# Patient Record
Sex: Female | Born: 1985 | Race: Black or African American | Hispanic: No | Marital: Single | Smoking: Never smoker
Health system: Southern US, Community
[De-identification: ages and names within clinical notes are randomized; demographics above are authoritative.]

## PROBLEM LIST (undated history)

## (undated) DIAGNOSIS — D509 Iron deficiency anemia, unspecified: Secondary | ICD-10-CM

## (undated) DIAGNOSIS — D649 Anemia, unspecified: Secondary | ICD-10-CM

## (undated) DIAGNOSIS — K529 Noninfective gastroenteritis and colitis, unspecified: Secondary | ICD-10-CM

## (undated) DIAGNOSIS — K5190 Ulcerative colitis, unspecified, without complications: Secondary | ICD-10-CM

## (undated) DIAGNOSIS — O1490 Unspecified pre-eclampsia, unspecified trimester: Secondary | ICD-10-CM

## (undated) HISTORY — DX: Iron deficiency anemia, unspecified: D50.9

---
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
## 2007-11-16 ENCOUNTER — Emergency Department (HOSPITAL_COMMUNITY): Admission: EM | Admit: 2007-11-16 | Discharge: 2007-11-16 | Payer: Self-pay | Admitting: Family Medicine

## 2010-09-04 ENCOUNTER — Inpatient Hospital Stay (INDEPENDENT_AMBULATORY_CARE_PROVIDER_SITE_OTHER)
Admission: RE | Admit: 2010-09-04 | Discharge: 2010-09-04 | Disposition: A | Discharge status: Home / Self Care | Payer: Self-pay | Source: Ambulatory Visit | Attending: Emergency Medicine | Admitting: Emergency Medicine

## 2010-09-04 DIAGNOSIS — K047 Periapical abscess without sinus: Secondary | ICD-10-CM

## 2010-09-04 DIAGNOSIS — S21109A Unspecified open wound of unspecified front wall of thorax without penetration into thoracic cavity, initial encounter: Secondary | ICD-10-CM

## 2010-09-05 ENCOUNTER — Emergency Department (HOSPITAL_COMMUNITY)
Admission: EM | Admit: 2010-09-05 | Discharge: 2010-09-05 | Disposition: A | Discharge status: Home / Self Care | Payer: Self-pay | Attending: Emergency Medicine | Admitting: Emergency Medicine

## 2010-09-05 DIAGNOSIS — R221 Localized swelling, mass and lump, neck: Secondary | ICD-10-CM | POA: Insufficient documentation

## 2010-09-05 DIAGNOSIS — R220 Localized swelling, mass and lump, head: Secondary | ICD-10-CM | POA: Insufficient documentation

## 2010-09-05 DIAGNOSIS — K047 Periapical abscess without sinus: Secondary | ICD-10-CM | POA: Insufficient documentation

## 2011-06-09 ENCOUNTER — Emergency Department (INDEPENDENT_AMBULATORY_CARE_PROVIDER_SITE_OTHER): Payer: Self-pay

## 2011-06-09 ENCOUNTER — Emergency Department (INDEPENDENT_AMBULATORY_CARE_PROVIDER_SITE_OTHER)
Admission: EM | Admit: 2011-06-09 | Discharge: 2011-06-09 | Disposition: A | Discharge status: Home Health | Payer: Self-pay | Source: Home / Self Care | Attending: Emergency Medicine | Admitting: Emergency Medicine

## 2011-06-09 ENCOUNTER — Encounter (HOSPITAL_COMMUNITY): Payer: Self-pay

## 2011-06-09 DIAGNOSIS — D649 Anemia, unspecified: Secondary | ICD-10-CM

## 2011-06-09 DIAGNOSIS — K529 Noninfective gastroenteritis and colitis, unspecified: Secondary | ICD-10-CM

## 2011-06-09 DIAGNOSIS — K5289 Other specified noninfective gastroenteritis and colitis: Secondary | ICD-10-CM

## 2011-06-09 LAB — DIFFERENTIAL
Basophils Absolute: 0.1 10*3/uL (ref 0.0–0.1)
Basophils Relative: 1 % (ref 0–1)
Eosinophils Absolute: 2.4 10*3/uL — ABNORMAL HIGH (ref 0.0–0.7)
Eosinophils Relative: 29 % — ABNORMAL HIGH (ref 0–5)
Lymphocytes Relative: 26 % (ref 12–46)
Lymphs Abs: 2.2 10*3/uL (ref 0.7–4.0)
Monocytes Absolute: 1.2 10*3/uL — ABNORMAL HIGH (ref 0.1–1.0)
Monocytes Relative: 14 % — ABNORMAL HIGH (ref 3–12)
Neutro Abs: 2.4 10*3/uL (ref 1.7–7.7)
Neutrophils Relative %: 30 % — ABNORMAL LOW (ref 43–77)

## 2011-06-09 LAB — CBC
HCT: 30.4 % — ABNORMAL LOW (ref 36.0–46.0)
Hemoglobin: 8.7 g/dL — ABNORMAL LOW (ref 12.0–15.0)
MCH: 20.9 pg — ABNORMAL LOW (ref 26.0–34.0)
MCHC: 28.6 g/dL — ABNORMAL LOW (ref 30.0–36.0)
MCV: 73.1 fL — ABNORMAL LOW (ref 78.0–100.0)
Platelets: 652 10*3/uL — ABNORMAL HIGH (ref 150–400)
RBC: 4.16 MIL/uL (ref 3.87–5.11)
RDW: 16.1 % — ABNORMAL HIGH (ref 11.5–15.5)
WBC: 8.3 10*3/uL (ref 4.0–10.5)

## 2011-06-09 MED ORDER — DICYCLOMINE HCL 20 MG PO TABS: 20.0000 mg | ORAL_TABLET | Freq: Two times a day (BID) | ORAL | Status: DC

## 2011-06-09 MED ORDER — CIPROFLOXACIN HCL 500 MG PO TABS: 500.0000 mg | ORAL_TABLET | Freq: Two times a day (BID) | ORAL | Status: AC

## 2011-06-09 NOTE — ED Provider Notes (Signed)
Chief Complaint  Patient presents with  . Abdominal Pain    History of Present Illness:   Monica Green is a 26 year old female who has had a five-day history of recurring generalized abdominal pain. The pains are sharp and involve the entire abdomen. They occur every 30-60 minutes and can last about 30 seconds at a time. There is nothing particular that brings the pains on. They're not brought on by eating or by food. Throughout that same time, she's had loose stools occurring once or twice a day with occasional blood but no mucus. She's felt a little bit nauseated but has not had any vomiting. No fever, chills, or sweats. Her appetite has been good, but she's not been eating much out of fear this might make her worse. She's had no foreign travel, suspicious exposures, or animal exposure. She denies any urinary symptoms or GYN complaints. Her last menstrual period was 2 weeks ago. She has not been sexually active for over a year.  Review of Systems:  Other than noted above, the patient denies any of the following symptoms: Constitutional:  No fever, chills, fatigue, weight loss or anorexia. Lungs:  No cough or shortness of breath. Heart:  No chest pain, palpitations, syncope or edema. Abdomen:  No nausea, vomiting, hematememesis, melena, diarrhea, or hematochezia. GU:  No dysuria, frequency, urgency, or hematuria. Gyn:  No vaginal discharge, itching, abnormal bleeding or pelvic pain. Skin:  No rash or itching.  PMFSH:  Past medical history, family history, social history, meds, and allergies were reviewed.  Physical Exam:   Vital signs:  LMP 05/31/2011 Gen:  Alert, oriented, in no distress. Lungs:  Breath sounds clear and equal bilaterally.  No wheezes, rales or rhonchi. Heart:  Regular rhythm.  No gallops or murmers.   Abdomen:  There was mild periumbilical tenderness to palpation without guarding or rebound. No organomegaly or mass. Bowel sounds are normally active. I recommended a digital rectal  exam, but patient declined at this time. Skin:  Clear, warm and dry.  No rash.  Labs:   Results for orders placed during the hospital encounter of 06/09/11  POCT PREGNANCY, URINE      Component Value Range   Preg Test, Ur NEGATIVE  NEGATIVE   CBC      Component Value Range   WBC 8.3  4.0 - 10.5 (K/uL)   RBC 4.16  3.87 - 5.11 (MIL/uL)   Hemoglobin 8.7 (*) 12.0 - 15.0 (g/dL)   HCT 16.1 (*) 09.6 - 46.0 (%)   MCV 73.1 (*) 78.0 - 100.0 (fL)   MCH 20.9 (*) 26.0 - 34.0 (pg)   MCHC 28.6 (*) 30.0 - 36.0 (g/dL)   RDW 04.5 (*) 40.9 - 15.5 (%)   Platelets 652 (*) 150 - 400 (K/uL)  DIFFERENTIAL      Component Value Range   Neutrophils Relative 30 (*) 43 - 77 (%)   Lymphocytes Relative 26  12 - 46 (%)   Monocytes Relative 14 (*) 3 - 12 (%)   Eosinophils Relative 29 (*) 0 - 5 (%)   Basophils Relative 1  0 - 1 (%)   Neutro Abs 2.4  1.7 - 7.7 (K/uL)   Lymphs Abs 2.2  0.7 - 4.0 (K/uL)   Monocytes Absolute 1.2 (*) 0.1 - 1.0 (K/uL)   Eosinophils Absolute 2.4 (*) 0.0 - 0.7 (K/uL)   Basophils Absolute 0.1  0.0 - 0.1 (K/uL)    A urinalysis was normal but has not resulted in her computerized record right  now.  Radiology:  Dg Abd Acute W/chest  06/09/2011  *RADIOLOGY REPORT*  Clinical Data: The stomach pain  ACUTE ABDOMEN SERIES (ABDOMEN 2 VIEW & CHEST 1 VIEW)  Comparison: None.  Findings: The lungs are clear without focal consolidation, edema, effusion or pneumothorax.  Cardiopericardial silhouette is within normal limits for size.  Imaged bony structures of the thorax are intact.  Upright film shows no evidence for intraperitoneal free air. There is no evidence for gaseous bowel dilation to suggest obstruction. Visualized bony structures are unremarkable.  Decorative beads overlie the pelvis.  IMPRESSION: Normal chest x-ray.  No evidence for bowel obstruction or perforation.  Original Report Authenticated By: ERIC A. MANSELL, M.D.    Assessment:   Diagnoses that have been ruled out:  None  Diagnoses  that are still under consideration:  None  Final diagnoses:  Gastroenteritis - bacterial versus viral   Anemia - probably due to menstrual blood loss. The patient does admit to heavy menses.     Plan:   1.  The following meds were prescribed:   New Prescriptions   CIPROFLOXACIN (CIPRO) 500 MG TABLET    Take 1 tablet (500 mg total) by mouth every 12 (twelve) hours.   DICYCLOMINE (BENTYL) 20 MG TABLET    Take 1 tablet (20 mg total) by mouth 2 (two) times daily.   2.  The patient was instructed in symptomatic care and handouts were given. 3.  The patient was told to return if becoming worse in any way, if no better in 3 or 4 days, and given some red flag symptoms that would indicate earlier return. 4.  A stool culture will be obtained. 5.  The patient was told to take ferrous sulfate 325 mg 3 times daily with meals and to followup with primary care physician in one to 2 weeks.    Reuben Likes, MD 06/09/11 [phone]

## 2011-06-09 NOTE — ED Notes (Signed)
C/o generalized aching and burning all over abdomen that is intermittent in nature.  Sx started on Monday.  Denies fever, urinary sx, vaginal sx.  States she always has frequent stools but that she has noticed they are a little more frequent and more liquid than usual.  Denies n/v. States when sx started she was also having belching.

## 2011-06-09 NOTE — ED Notes (Signed)
Gave patient verbal instructions as to how to obtain fecal specimen. Gave patient specimen container, bio bag, instructions and to have specimen back to the [facility] lab by 1930

## 2011-06-09 NOTE — Discharge Instructions (Signed)
You have been diagnosed with gastroenteritis.  This can be caused by a virus or a bacteria.  Viral infections can last from less than a day to a week.  If your symptoms last more than a week, a bacterial infection is more likely.  Either way, you must assume you are contagious and take infectious precautions.  If you work in food preparation, you should stay out of work.  Likewise, you should not prepare food for your family.  Practice frequent hand washing.  Hand sanitizer does not reliably kill the virus.  Wash your hands after you use the bathroom, touch your mouth or face, and before contact with anyone.  Do not kiss anyone and do not let anyone eat or drink after you.  For right now, we recommend taking only clear liquids.  This would include things like Gator Aid or other sports drinks, tea, water, ice chips, clear juices, ginger ale, Seven-Up, Sprite, Pedialyte, jello, clear broth--anything you can see through and applesauce.  You should do this for at least 24 hours, perhaps longer.  We recommend small sips at a time.  Sometimes drinking a large amount will cause you to be nauseated and you will vomit it back up.  Sometimes it helps to have this chilled or drink it over ice chips.  Once your stomach settles down a little, you can advance to a very light diet.  We have a diet called the b.r.a.t. Diet which stands for the following:  Bananas  Rice  Apple sauce (not apple juice)  Toast or crackers.  If diarrhea becomes a problem, you may try Imodeum unless your doctor tells you not to. You can take up to 4 per day or 1 every 6 hours.  Stick with this for about 24 hours, then you may advance to a more regular diet, but your stomach will be sensitive for 5 to 7 days, so it would be a good idea to avoid heavy, greasy, fried, or spicey foods.    You should return if:  You symptoms are not better in 3 days or they have gone on for 7 days total.  You have severe symptoms of high fever or severe  abdominal pain.  You feel you are getting dehydrated with dizziness, weakness, muscle cramps, or severe fatigue.  You have blood in your vomitus or stool.  This includes black discoloration of your vomitus or stool.  But remember that Pepto Bismol can cause black stools.     Take Ferrous sulfate 325 mg 3 times daily with meals and recheck blood count with primary care doctor in 2 weeks.

## 2012-02-04 ENCOUNTER — Encounter (HOSPITAL_COMMUNITY): Payer: Self-pay | Admitting: *Deleted

## 2012-02-04 ENCOUNTER — Emergency Department (HOSPITAL_COMMUNITY)
Admission: EM | Admit: 2012-02-04 | Discharge: 2012-02-04 | Disposition: A | Discharge status: Home / Self Care | Payer: Self-pay | Attending: Emergency Medicine | Admitting: Emergency Medicine

## 2012-02-04 DIAGNOSIS — R109 Unspecified abdominal pain: Secondary | ICD-10-CM

## 2012-02-04 DIAGNOSIS — R112 Nausea with vomiting, unspecified: Secondary | ICD-10-CM | POA: Insufficient documentation

## 2012-02-04 DIAGNOSIS — N390 Urinary tract infection, site not specified: Secondary | ICD-10-CM | POA: Insufficient documentation

## 2012-02-04 LAB — URINALYSIS, ROUTINE W REFLEX MICROSCOPIC
Glucose, UA: NEGATIVE mg/dL
Nitrite: NEGATIVE
Protein, ur: NEGATIVE mg/dL
pH: 6.5 (ref 5.0–8.0)

## 2012-02-04 LAB — URINE MICROSCOPIC-ADD ON

## 2012-02-04 LAB — PREGNANCY, URINE: Preg Test, Ur: NEGATIVE

## 2012-02-04 MED ORDER — SODIUM CHLORIDE 0.9 % IV SOLN
Freq: Once | INTRAVENOUS | Status: AC
  Administered 2012-02-04: 50 mL/h via INTRAVENOUS

## 2012-02-04 MED ORDER — ONDANSETRON HCL 4 MG/2ML IJ SOLN
INTRAMUSCULAR | Status: AC
  Filled 2012-02-04: qty 2

## 2012-02-04 MED ORDER — HYDROCODONE-ACETAMINOPHEN 5-325 MG PO TABS: 1.0000 | ORAL_TABLET | ORAL | Status: DC | PRN

## 2012-02-04 MED ORDER — SULFAMETHOXAZOLE-TRIMETHOPRIM 800-160 MG PO TABS: 1.0000 | ORAL_TABLET | Freq: Two times a day (BID) | ORAL | Status: DC

## 2012-02-04 MED ORDER — FENTANYL CITRATE 0.05 MG/ML IJ SOLN
50.0000 ug | Freq: Once | INTRAMUSCULAR | Status: AC
  Administered 2012-02-04: 50 ug via INTRAVENOUS
  Filled 2012-02-04: qty 2

## 2012-02-04 MED ORDER — ONDANSETRON HCL 4 MG/2ML IJ SOLN
4.0000 mg | Freq: Once | INTRAMUSCULAR | Status: AC
  Administered 2012-02-04: 4 mg via INTRAVENOUS

## 2012-02-04 MED ORDER — FLUCONAZOLE 150 MG PO TABS: 150.0000 mg | ORAL_TABLET | Freq: Once | ORAL | Status: DC

## 2012-02-04 MED ORDER — ONDANSETRON HCL 4 MG PO TABS: 4.0000 mg | ORAL_TABLET | Freq: Four times a day (QID) | ORAL | Status: DC

## 2012-02-04 NOTE — ED Provider Notes (Signed)
Medical screening examination/treatment/procedure(s) were performed by non-physician practitioner and as supervising physician I was immediately available for consultation/collaboration.   Hanley Seamen, MD 02/04/12 [phone]

## 2012-02-04 NOTE — ED Notes (Signed)
Patient is alert and oriented x3.  She is complaining right side flank pain.   The pain started at 11pm last night.  She has been nausea and vomiting since last night Currently she rates her pain a 7 of 10.

## 2012-02-04 NOTE — ED Notes (Signed)
Called lab to request results.  Lab denies that blood was received.  Tech in triage states that blood was sent.  Pt advised that labs need to be redrawn.

## 2012-02-04 NOTE — ED Provider Notes (Signed)
History     CSN: [id number]  Arrival date & time 02/04/12  0102   First MD Initiated Contact with Patient 02/04/12 0615      Chief Complaint  Patient presents with  . Flank Pain    (Consider location/radiation/quality/duration/timing/severity/associated sxs/prior treatment) Patient is a 26 y.o. female presenting with flank pain. The history is provided by the patient and a relative.  Flank Pain This is a new problem. The current episode started today. Associated symptoms include abdominal pain, nausea and vomiting. Pertinent negatives include no fever. Associated symptoms comments: Sudden onset of right flank pain that woke her from sleep early this morning causing nausea and vomiting. No urinary symptoms or hematuria. No fever. No history of kidney stones. She denies vaginal discharge or pelvic pain. The pain from flank radiated around to the RLQ abdomen.Marland Kitchen    History reviewed. No pertinent past medical history.  History reviewed. No pertinent past surgical history.  History reviewed. No pertinent family history.  History  Substance Use Topics  . Smoking status: Never Smoker   . Smokeless tobacco: Not on file  . Alcohol Use: Yes    OB History    Grav Para Term Preterm Abortions TAB SAB Ect Mult Living                  Review of Systems  Constitutional: Negative for fever.  Gastrointestinal: Positive for nausea, vomiting and abdominal pain. Negative for diarrhea.  Genitourinary: Positive for flank pain. Negative for dysuria, hematuria, vaginal bleeding, vaginal discharge and pelvic pain.    Allergies  Review of patient's allergies indicates no known allergies.  Home Medications   Current Outpatient Rx  Name  Route  Sig  Dispense  Refill  . ACETAMINOPHEN 500 MG PO TABS   Oral   Take 500 mg by mouth every 6 (six) hours as needed. PAIN           BP 104/45  Resp 18  SpO2 99%  LMP 01/21/2012  Physical Exam  Constitutional: She is oriented to person, place,  and time. She appears well-developed and well-nourished.  Neck: Normal range of motion.  Pulmonary/Chest: Effort normal.  Abdominal: Soft. Bowel sounds are normal. There is no tenderness. There is no rebound and no guarding.  Genitourinary:       No flank tenderness.   Musculoskeletal: Normal range of motion.  Neurological: She is alert and oriented to person, place, and time.  Skin: Skin is warm and dry.    ED Course  Procedures (including critical care time)  Labs Reviewed  URINALYSIS, ROUTINE W REFLEX MICROSCOPIC - Abnormal; Notable for the following:    APPearance CLOUDY (*)     Hgb urine dipstick MODERATE (*)     Ketones, ur 15 (*)     Leukocytes, UA LARGE (*)     All other components within normal limits  URINE MICROSCOPIC-ADD ON - Abnormal; Notable for the following:    Squamous Epithelial / LPF FEW (*)     Bacteria, UA FEW (*)     All other components within normal limits  PREGNANCY, URINE  CBC WITH DIFFERENTIAL  COMPREHENSIVE METABOLIC PANEL  URINE CULTURE   No results found.   No diagnosis found. 1. uti 2. Flank pain   MDM  The patient was given pain medication in ED and has been pain free since that time. No further nausea. UA shows leukocytes and blood, rare bacteria. She has no RLQ abdominal tenderness. Discussed condition with mom and patient,  CT to eval for stone vs urine culture and treatment. Will treat with antibiotics and patient understands to return with any worsening symptoms incl. Fever or severe pain.        Rodena Medin, PA-C 02/04/12 [phone]

## 2012-02-04 NOTE — Discharge Instructions (Signed)
IT IS IMPORTANT TO RETURN TO THE ED FOR FURTHER EVALUATION IF YOU HAVE HIGH FEVER, SEVERE PAIN OR NEW CONCERN. TAKE ANTIBIOTICS AS PRESCRIBED. TAKE DIFLUCAN AFTER COMPLETION OF ANTIBIOTICS IF YOU DEVELOP SYMPTOMS OF YEAST INFECTION.   Urinary Tract Infection Urinary tract infections (UTIs) can develop anywhere along your urinary tract. Your urinary tract is your body's drainage system for removing wastes and extra water. Your urinary tract includes two kidneys, two ureters, a bladder, and a urethra. Your kidneys are a pair of bean-shaped organs. Each kidney is about the size of your fist. They are located below your ribs, one on each side of your spine. CAUSES Infections are caused by microbes, which are microscopic organisms, including fungi, viruses, and bacteria. These organisms are so small that they can only be seen through a microscope. Bacteria are the microbes that most commonly cause UTIs. SYMPTOMS  Symptoms of UTIs may vary by age and gender of the patient and by the location of the infection. Symptoms in young women typically include a frequent and intense urge to urinate and a painful, burning feeling in the bladder or urethra during urination. Older women and men are more likely to be tired, shaky, and weak and have muscle aches and abdominal pain. A fever may mean the infection is in your kidneys. Other symptoms of a kidney infection include pain in your back or sides below the ribs, nausea, and vomiting. DIAGNOSIS To diagnose a UTI, your caregiver will ask you about your symptoms. Your caregiver also will ask to provide a urine sample. The urine sample will be tested for bacteria and white blood cells. White blood cells are made by your body to help fight infection. TREATMENT  Typically, UTIs can be treated with medication. Because most UTIs are caused by a bacterial infection, they usually can be treated with the use of antibiotics. The choice of antibiotic and length of treatment depend  on your symptoms and the type of bacteria causing your infection. HOME CARE INSTRUCTIONS  If you were prescribed antibiotics, take them exactly as your caregiver instructs you. Finish the medication even if you feel better after you have only taken some of the medication.  Drink enough water and fluids to keep your urine clear or pale yellow.  Avoid caffeine, tea, and carbonated beverages. They tend to irritate your bladder.  Empty your bladder often. Avoid holding urine for long periods of time.  Empty your bladder before and after sexual intercourse.  After a bowel movement, women should cleanse from front to back. Use each tissue only once. SEEK MEDICAL CARE IF:   You have back pain.  You develop a fever.  Your symptoms do not begin to resolve within 3 days. SEEK IMMEDIATE MEDICAL CARE IF:   You have severe back pain or lower abdominal pain.  You develop chills.  You have nausea or vomiting.  You have continued burning or discomfort with urination. MAKE SURE YOU:   Understand these instructions.  Will watch your condition.  Will get help right away if you are not doing well or get worse. Document Released: 12/28/2004 Document Revised: 09/19/2011 Document Reviewed: 04/28/2011 [facility] Patient Information 2013 [place], [place].

## 2012-02-05 LAB — URINE CULTURE: Colony Count: 80000

## 2015-01-04 DIAGNOSIS — N390 Urinary tract infection, site not specified: Secondary | ICD-10-CM | POA: Diagnosis present

## 2015-01-04 DIAGNOSIS — K589 Irritable bowel syndrome without diarrhea: Secondary | ICD-10-CM | POA: Diagnosis present

## 2015-01-04 DIAGNOSIS — N760 Acute vaginitis: Secondary | ICD-10-CM | POA: Diagnosis present

## 2015-01-04 DIAGNOSIS — D509 Iron deficiency anemia, unspecified: Principal | ICD-10-CM | POA: Diagnosis present

## 2015-01-04 DIAGNOSIS — N132 Hydronephrosis with renal and ureteral calculous obstruction: Secondary | ICD-10-CM | POA: Diagnosis present

## 2015-01-05 ENCOUNTER — Inpatient Hospital Stay (HOSPITAL_COMMUNITY)
Admission: EM | Admit: 2015-01-05 | Discharge: 2015-01-06 | DRG: 812 | Disposition: A | Discharge status: Home / Self Care | Payer: Self-pay | Attending: Internal Medicine | Admitting: Internal Medicine

## 2015-01-05 ENCOUNTER — Encounter (HOSPITAL_COMMUNITY): Payer: Self-pay | Admitting: Emergency Medicine

## 2015-01-05 ENCOUNTER — Inpatient Hospital Stay (HOSPITAL_COMMUNITY): Payer: Self-pay

## 2015-01-05 DIAGNOSIS — D509 Iron deficiency anemia, unspecified: Secondary | ICD-10-CM | POA: Diagnosis present

## 2015-01-05 DIAGNOSIS — B9689 Other specified bacterial agents as the cause of diseases classified elsewhere: Secondary | ICD-10-CM | POA: Diagnosis present

## 2015-01-05 DIAGNOSIS — N760 Acute vaginitis: Secondary | ICD-10-CM

## 2015-01-05 DIAGNOSIS — R109 Unspecified abdominal pain: Secondary | ICD-10-CM

## 2015-01-05 DIAGNOSIS — D649 Anemia, unspecified: Secondary | ICD-10-CM

## 2015-01-05 DIAGNOSIS — N201 Calculus of ureter: Secondary | ICD-10-CM | POA: Diagnosis present

## 2015-01-05 LAB — URINE MICROSCOPIC-ADD ON

## 2015-01-05 LAB — URINALYSIS, ROUTINE W REFLEX MICROSCOPIC
BILIRUBIN URINE: NEGATIVE
Glucose, UA: NEGATIVE mg/dL
Hgb urine dipstick: NEGATIVE
Ketones, ur: 15 mg/dL — AB
NITRITE: NEGATIVE
PH: 8.5 — AB (ref 5.0–8.0)
Protein, ur: 30 mg/dL — AB
SPECIFIC GRAVITY, URINE: 1.023 (ref 1.005–1.030)
UROBILINOGEN UA: 0.2 mg/dL (ref 0.0–1.0)

## 2015-01-05 LAB — CBC WITH DIFFERENTIAL/PLATELET
BASOS ABS: 0.2 10*3/uL — AB (ref 0.0–0.1)
Basophils Relative: 2 %
Eosinophils Absolute: 1.5 10*3/uL — ABNORMAL HIGH (ref 0.0–0.7)
Eosinophils Relative: 17 %
HCT: 15.9 % — ABNORMAL LOW (ref 36.0–46.0)
Hemoglobin: 3.7 g/dL — CL (ref 12.0–15.0)
LYMPHS ABS: 1.5 10*3/uL (ref 0.7–4.0)
Lymphocytes Relative: 16 %
MCH: 13.7 pg — ABNORMAL LOW (ref 26.0–34.0)
MCHC: 23.3 g/dL — ABNORMAL LOW (ref 30.0–36.0)
MCV: 58.7 fL — ABNORMAL LOW (ref 78.0–100.0)
MONO ABS: 1.2 10*3/uL — AB (ref 0.1–1.0)
Monocytes Relative: 13 %
NEUTROS PCT: 52 %
Neutro Abs: 4.7 10*3/uL (ref 1.7–7.7)
PLATELETS: 797 10*3/uL — AB (ref 150–400)
RBC: 2.71 MIL/uL — AB (ref 3.87–5.11)
RDW: 21.5 % — AB (ref 11.5–15.5)
WBC: 9.1 10*3/uL (ref 4.0–10.5)

## 2015-01-05 LAB — IRON AND TIBC
Iron: 6 ug/dL — ABNORMAL LOW (ref 28–170)
Saturation Ratios: 1 % — ABNORMAL LOW (ref 10.4–31.8)
TIBC: 459 ug/dL — ABNORMAL HIGH (ref 250–450)
UIBC: 453 ug/dL

## 2015-01-05 LAB — CBC
HEMATOCRIT: 16.5 % — AB (ref 36.0–46.0)
HEMATOCRIT: 28.5 % — AB (ref 36.0–46.0)
HEMOGLOBIN: 3.8 g/dL — AB (ref 12.0–15.0)
Hemoglobin: 8.2 g/dL — ABNORMAL LOW (ref 12.0–15.0)
MCH: 13.5 pg — ABNORMAL LOW (ref 26.0–34.0)
MCH: 19.5 pg — ABNORMAL LOW (ref 26.0–34.0)
MCHC: 23 g/dL — ABNORMAL LOW (ref 30.0–36.0)
MCHC: 28.8 g/dL — AB (ref 30.0–36.0)
MCV: 58.5 fL — AB (ref 78.0–100.0)
MCV: 67.9 fL — AB (ref 78.0–100.0)
Platelets: 682 10*3/uL — ABNORMAL HIGH (ref 150–400)
Platelets: 833 10*3/uL — ABNORMAL HIGH (ref 150–400)
RBC: 2.82 MIL/uL — ABNORMAL LOW (ref 3.87–5.11)
RBC: 4.2 MIL/uL (ref 3.87–5.11)
RDW: 21.4 % — AB (ref 11.5–15.5)
RDW: 27 % — AB (ref 11.5–15.5)
WBC: 8 10*3/uL (ref 4.0–10.5)
WBC: 9 10*3/uL (ref 4.0–10.5)

## 2015-01-05 LAB — BASIC METABOLIC PANEL
ANION GAP: 8 (ref 5–15)
BUN: 9 mg/dL (ref 6–20)
CHLORIDE: 105 mmol/L (ref 101–111)
CO2: 24 mmol/L (ref 22–32)
Calcium: 8.6 mg/dL — ABNORMAL LOW (ref 8.9–10.3)
Creatinine, Ser: 0.77 mg/dL (ref 0.44–1.00)
GFR calc Af Amer: >60 mL/min (ref >60)
GLUCOSE: 113 mg/dL — AB (ref 65–99)
POTASSIUM: 3.4 mmol/L — AB (ref 3.5–5.1)
SODIUM: 137 mmol/L (ref 135–145)

## 2015-01-05 LAB — WET PREP, GENITAL
TRICH WET PREP: NONE SEEN
Yeast Wet Prep HPF POC: NONE SEEN

## 2015-01-05 LAB — RETICULOCYTES
RBC.: 2.87 MIL/uL — AB (ref 3.87–5.11)
RETIC COUNT ABSOLUTE: 66 10*3/uL (ref 19.0–186.0)
Retic Ct Pct: 2.3 % (ref 0.4–3.1)

## 2015-01-05 LAB — ABO/RH: ABO/RH(D): B POS

## 2015-01-05 LAB — I-STAT CHEM 8, ED
BUN: 10 mg/dL (ref 6–20)
CALCIUM ION: 1.15 mmol/L (ref 1.12–1.23)
CHLORIDE: 105 mmol/L (ref 101–111)
Creatinine, Ser: 0.7 mg/dL (ref 0.44–1.00)
GLUCOSE: 104 mg/dL — AB (ref 65–99)
HEMATOCRIT: 17 % — AB (ref 36.0–46.0)
HEMOGLOBIN: 5.8 g/dL — AB (ref 12.0–15.0)
POTASSIUM: 3.4 mmol/L — AB (ref 3.5–5.1)
SODIUM: 140 mmol/L (ref 135–145)
TCO2: 21 mmol/L (ref 0–100)

## 2015-01-05 LAB — POC OCCULT BLOOD, ED: Fecal Occult Bld: NEGATIVE

## 2015-01-05 LAB — FERRITIN: Ferritin: 4 ng/mL — ABNORMAL LOW (ref 11–307)

## 2015-01-05 LAB — GC/CHLAMYDIA PROBE AMP (~~LOC~~) NOT AT ARMC
Chlamydia: NEGATIVE
Neisseria Gonorrhea: NEGATIVE

## 2015-01-05 LAB — POC URINE PREG, ED: PREG TEST UR: NEGATIVE

## 2015-01-05 LAB — FOLATE: FOLATE: 6.7 ng/mL (ref >5.9)

## 2015-01-05 LAB — PREPARE RBC (CROSSMATCH)

## 2015-01-05 LAB — VITAMIN B12: VITAMIN B 12: 546 pg/mL (ref 180–914)

## 2015-01-05 MED ORDER — ONDANSETRON HCL 4 MG/2ML IJ SOLN
4.0000 mg | Freq: Once | INTRAMUSCULAR | Status: AC
  Administered 2015-01-05: 4 mg via INTRAVENOUS
  Filled 2015-01-05: qty 2

## 2015-01-05 MED ORDER — METRONIDAZOLE 500 MG PO TABS
500.0000 mg | ORAL_TABLET | Freq: Three times a day (TID) | ORAL | Status: DC
  Administered 2015-01-05 – 2015-01-06 (×4): 500 mg via ORAL
  Filled 2015-01-05 (×4): qty 1

## 2015-01-05 MED ORDER — FENTANYL CITRATE (PF) 100 MCG/2ML IJ SOLN
50.0000 ug | Freq: Once | INTRAMUSCULAR | Status: AC
Start: 2015-01-05 — End: 2015-01-05
  Administered 2015-01-05: 50 ug via INTRAVENOUS
  Filled 2015-01-05: qty 2

## 2015-01-05 MED ORDER — SODIUM CHLORIDE 0.9 % IV SOLN
25.0000 mg | Freq: Once | INTRAVENOUS | Status: AC
  Administered 2015-01-05: 25 mg via INTRAVENOUS
  Filled 2015-01-05: qty 0.5

## 2015-01-05 MED ORDER — POTASSIUM CHLORIDE CRYS ER 20 MEQ PO TBCR
40.0000 meq | EXTENDED_RELEASE_TABLET | Freq: Once | ORAL | Status: AC
  Administered 2015-01-05: 40 meq via ORAL
  Filled 2015-01-05: qty 2

## 2015-01-05 MED ORDER — POLYETHYLENE GLYCOL 3350 17 G PO PACK
17.0000 g | PACK | Freq: Every day | ORAL | Status: DC
  Administered 2015-01-05 – 2015-01-06 (×2): 17 g via ORAL
  Filled 2015-01-05 (×2): qty 1

## 2015-01-05 MED ORDER — TAMSULOSIN HCL 0.4 MG PO CAPS
0.4000 mg | ORAL_CAPSULE | Freq: Every day | ORAL | Status: DC
  Administered 2015-01-05 – 2015-01-06 (×2): 0.4 mg via ORAL
  Filled 2015-01-05 (×2): qty 1

## 2015-01-05 MED ORDER — ONDANSETRON HCL 4 MG/2ML IJ SOLN
4.0000 mg | Freq: Four times a day (QID) | INTRAMUSCULAR | Status: DC | PRN
  Administered 2015-01-05: 4 mg via INTRAVENOUS
  Filled 2015-01-05: qty 2

## 2015-01-05 MED ORDER — BOOST / RESOURCE BREEZE PO LIQD
1.0000 | Freq: Two times a day (BID) | ORAL | Status: DC
  Administered 2015-01-05: 1 via ORAL

## 2015-01-05 MED ORDER — MORPHINE SULFATE (PF) 4 MG/ML IV SOLN
4.0000 mg | Freq: Once | INTRAVENOUS | Status: AC
  Administered 2015-01-05: 2.5 mg via INTRAVENOUS
  Filled 2015-01-05: qty 1

## 2015-01-05 MED ORDER — KETOROLAC TROMETHAMINE 15 MG/ML IJ SOLN
15.0000 mg | Freq: Four times a day (QID) | INTRAMUSCULAR | Status: DC | PRN
  Administered 2015-01-05 – 2015-01-06 (×4): 15 mg via INTRAVENOUS
  Filled 2015-01-05 (×7): qty 1

## 2015-01-05 MED ORDER — SODIUM CHLORIDE 0.9 % IV SOLN: 10.0000 mL/h | Freq: Once | INTRAVENOUS | Status: DC

## 2015-01-05 MED ORDER — DEXTROSE 5 % IV SOLN
1.0000 g | INTRAVENOUS | Status: DC
  Administered 2015-01-05: 1 g via INTRAVENOUS
  Filled 2015-01-05: qty 10

## 2015-01-05 MED ORDER — SODIUM CHLORIDE 0.9 % IV SOLN
500.0000 mg | Freq: Every day | INTRAVENOUS | Status: AC
  Administered 2015-01-05 – 2015-01-06 (×2): 500 mg via INTRAVENOUS
  Filled 2015-01-05 (×3): qty 10

## 2015-01-05 NOTE — Progress Notes (Signed)
Initial Nutrition Assessment   INTERVENTION:  Provide Boost Breeze po BID, each supplement provides 250 kcal and 9 grams of protein Recommend providing 500 mg of Vitamin C once daily to improve iron absorption   NUTRITION DIAGNOSIS:   Altered nutrition lab value related to altered GI function as evidenced by per patient/family report, meal completion < 25%, other (see comment) (loose stools).   GOAL:   Patient will meet greater than or equal to 90% of their needs   MONITOR:   PO intake, Supplement acceptance, Labs, Weight trends  REASON FOR ASSESSMENT:   Consult  (Question possible celiac diease. GI recommends dietitian consult)  ASSESSMENT:   29 y.o. female with h/o IBS who presents to the ED with c/o R sided flank pain onset this evening around 8:30 pm. Pain is sharp, 7/10 in severity. Associated with dysuria, N/V (4 non-bloody episodes of emesis).Patient has also been short of breath with activity, having dizziness and lightheadedness with activity for months to years per mother.In addition to a mild UTI, patient is found to be profoundly anemic with HGB of 3.7 in ED.  Pt not feeling well at time of visit; sister assisted in answering questions. Pt's gastrointestinal symptoms include loose stools after eating, gas, and bloating. Other symptoms include anemia and fatigue. She states that she primarily eats fast food; biscuits, burgers, chicken, french fries etc. She eats fruits and vegetables 1-2 times per week. She has lost from 135 lbs to 127 lbs in the past year (6% weight loss). Based on pt's reports gastrointestinal symptoms could be related IBS, celiac disease, or poor diet. Very low hemoglobin and low calcium are concerning for possible celiac disease. Tissue transglutaminase, IgA testing is in process. RD will monitor results and provide education as needed.   Pt has no appetite today and is eating very little. Will supplement with Boost Breeze for now. Pt declined any  nutrition education at this as she is not feeling well. RD briefly discussed foods that help prevent diarrhea and foods that contribute.   Labs: low hemoglobin, low calcium  Diet Order:  Diet regular Room service appropriate?: Yes; Fluid consistency:: Thin  Skin:  Reviewed, no issues  Last BM:  10/3  Height:   Ht Readings from Last 1 Encounters:  01/05/15 5\' 4"  (1.626 m)    Weight:   Wt Readings from Last 1 Encounters:  01/05/15 127 lb (57.607 kg)    Ideal Body Weight:  54.5 kg  BMI:  Body mass index is 21.79 kg/(m^2).  Estimated Nutritional Needs:   Kcal:  1700-1900  Protein:  >/=70 grams  Fluid:  >/=1.7 L/day  EDUCATION NEEDS:   Education needs no appropriate at this time (pt declined due to not feeling well)  Scarlette Ar RD, LDN Inpatient Clinical Dietitian Pager: [phone] After Hours Pager: [phone]

## 2015-01-05 NOTE — ED Notes (Signed)
Pt. reports right flank pain with emesis onset this evening , denies dysuria or hematuria / no fever or chills.

## 2015-01-05 NOTE — Progress Notes (Signed)
Pt. Arrived to the unit via stretcher in alert and stable condition. No s/s of distress or discomfort noted. Pt. States she is tired. Unit 1 of 4 of PRBC infusing at this time. Pts. VSS WNL. Pt. Oriented to room and call light placed within reach. Pts. Sister at her bedside. RN will continue to monitor pt. For changes in condition. Toddrick Sanna, Katherine Roan

## 2015-01-05 NOTE — Progress Notes (Signed)
Pt has received 2 units of bld, will recheck CBC to determine if more bld needed, pt received Iron IV, pt also c/o right flank pain, PRN Toradol given as ordered, 40 mEq of K given, VSS, pt stble

## 2015-01-05 NOTE — Progress Notes (Signed)
[facility] for IV Iron Indication: Iron-deficiency anemia  No Known Allergies  Patient Measurements: Height: 5\' 4"  (162.6 cm) Weight: 127 lb (57.607 kg) IBW/kg (Calculated) : 54.7   Vital Signs: Temp: 98.8 F (37.1 C) (10/04 0817) Temp Source: Oral (10/04 0817) BP: 118/58 mmHg (10/04 0817) Pulse Rate: 78 (10/04 0817)   Assessment: Based on calculations, patient will require ~ 1.5 g of Iron to replenish stores.   Hgb 3.8 > 5.8 Fe 4 Tsat 1 Ferritin 6   Plan:  -Fe dextran 25 mg test dose x1, -Fe dextran 500 mg IV x 2 days  -Recommend discharging patient with PO iron     Hughes Better, PharmD, BCPS Clinical Pharmacist Pager: [phone] 01/05/2015 9:31 AM

## 2015-01-05 NOTE — ED Provider Notes (Signed)
8206 - Pelvic and rectal exam completed as patient requested female provider for these exams.  Physical Exam  Constitutional:  Nontoxic/nonseptic appearing female  HENT:  Head: Normocephalic and atraumatic.  Genitourinary: Vagina normal. Rectal exam shows no internal hemorrhoid, no fissure, no mass and no tenderness. Uterus is not deviated, not enlarged and not tender. Right adnexum displays no deviation, no mass and no tenderness. Left adnexum displays no deviation, no mass and no tenderness. No vaginal discharge found.  No cervical friability, CMT, or adnexal TTP. No gross blood on rectal exam. No melena. No fissure or hemorrhoids noted.     Antonietta Breach, PA-C 01/05/15 [phone]

## 2015-01-05 NOTE — ED Notes (Signed)
MD at bedside. 

## 2015-01-05 NOTE — H&P (Signed)
[facility] History and Physical  MARIACELESTE HERRERA [id number]:[id number] DOB: Nov 30, 1985 DOA: 01/05/2015  Referring physician: EDP PCP: No primary care provider on file.   Chief Complaint: Flank pain   HPI: Monica ABRUZZESE is a 29 y.o. female with h/o IBS who presents to the ED with c/o R sided flank pain onset this evening around 8:30 pm.  Pain is sharp, 7/10 in severity.  Associated with dysuria, N/V (4 non-bloody episodes of emesis).  Patient has also been short of breath with activity, having dizziness and lightheadedness with activity for months to years per mother.  In addition to a mild UTI, patient is found to be profoundly anemic with HGB of 3.7 in ED.  Review of Systems: Systems reviewed.  As above, otherwise negative  History reviewed. No pertinent past medical history. History reviewed. No pertinent past surgical history. Social History:  reports that she has never smoked. She does not have any smokeless tobacco history on file. She reports that she drinks alcohol. She reports that she does not use illicit drugs.  No Known Allergies  No family history on file.   Prior to Admission medications   Medication Sig Start Date End Date Taking? Authorizing Provider  ibuprofen (ADVIL,MOTRIN) 200 MG tablet Take 400 mg by mouth every 6 (six) hours as needed for mild pain.   Yes Historical Provider, MD   Physical Exam: Filed Vitals:   01/05/15 0245  BP: 114/69  Pulse: 96  Temp:   Resp: 16    BP 114/69 mmHg  Pulse 96  Temp(Src) 98 F (36.7 C) (Oral)  Resp 16  Ht 5\' 4"  (1.626 m)  Wt 57.607 kg (127 lb)  BMI 21.79 kg/m2  SpO2 100%  LMP 12/29/2014 (Approximate)  General Appearance:    Alert, oriented, no distress, appears stated age  Head:    Normocephalic, atraumatic  Eyes:    PERRL, EOMI, sclera non-icteric        Nose:   Nares without drainage or epistaxis. Mucosa, turbinates normal  Throat:   Moist mucous membranes. Oropharynx without erythema or  exudate.  Neck:   Supple. No carotid bruits.  No thyromegaly.  No lymphadenopathy.   Back:     No CVA tenderness, no spinal tenderness  Lungs:     Clear to auscultation bilaterally, without wheezes, rhonchi or rales  Chest wall:    No tenderness to palpitation  Heart:    Regular rate and rhythm without murmurs, gallops, rubs  Abdomen:     Soft, non-tender, nondistended, normal bowel sounds, no organomegaly  Genitalia:    deferred  Rectal:    deferred  Extremities:   No clubbing, cyanosis or edema.  Pulses:   2+ and symmetric all extremities  Skin:   Skin color, texture, turgor normal, no rashes or lesions  Lymph nodes:   Cervical, supraclavicular, and axillary nodes normal  Neurologic:   CNII-XII intact. Normal strength, sensation and reflexes      throughout    Labs on Admission:  Basic Metabolic Panel:  Recent Labs Lab 01/05/15 0027 01/05/15 0207  NA 137 140  K 3.4* 3.4*  CL 105 105  CO2 24  --   GLUCOSE 113* 104*  BUN 9 10  CREATININE 0.77 0.70  CALCIUM 8.6*  --    Liver Function Tests: No results for input(s): AST, ALT, ALKPHOS, BILITOT, PROT, ALBUMIN in the last 168 hours. No results for input(s): LIPASE, AMYLASE in the last 168 hours. No results for input(s): AMMONIA in the  last 168 hours. CBC:  Recent Labs Lab 01/05/15 0027 01/05/15 0204 01/05/15 0207  WBC 8.0 9.1  --   NEUTROABS  --  PENDING  --   HGB 3.8* 3.7* 5.8*  HCT 16.5* 15.9* 17.0*  MCV 58.5* 58.7*  --   PLT 833* 797*  --    Cardiac Enzymes: No results for input(s): CKTOTAL, CKMB, CKMBINDEX, TROPONINI in the last 168 hours.  BNP (last 3 results) No results for input(s): PROBNP in the last 8760 hours. CBG: No results for input(s): GLUCAP in the last 168 hours.  Radiological Exams on Admission: No results found.  EKG: Independently reviewed.  Assessment/Plan Principal Problem:   Microcytic hypochromic anemia Active Problems:   Symptomatic anemia   1. Microcytic hypochromic anemia -   1. Suspect iron deficiency anemia that may very well be chronic 2. No source of acute bleed, just finished her period but this wasn't overly heavy she says. 3. Anemia panel ordered including iron studies. 4. Transfusing 4 units PRBC 5. Hemoccult pending 2. UTI - rocephin, cultures pending    Code Status: Full Code  Family Communication: Mother at bedside Disposition Plan: Admit to inpatient   Time spent: 50 min  Chanz Cahall M. [facility] Pager [phone]  If 7AM-7PM, please contact the day team taking care of the patient [email or web address] Password [email or web address] 01/05/2015, 3:43 AM

## 2015-01-05 NOTE — Progress Notes (Signed)
CBC has resulted. Hgb now 8.2 after patient received 2 units of PRBC. Dr. Wyline Copas made aware of CBC results. Original blood administration order written for patient to receive 4 units of blood. Dr. Wyline Copas does NOT want remaining 2 units given due to hgb level now. Patient made aware. Will also notify Blood bank. Dr. Wyline Copas wanted to make sure dietitian spoke with patient regarding diet modifications for probable Celiac disease. Patient states that dietitian did come speak with her. Patient was able to list of foods that were good to eat with Celiac disease.

## 2015-01-05 NOTE — Progress Notes (Signed)
[facility] PROGRESS NOTE  Monica Green TFT:[id number] DOB: May 05, 1985 DOA: 01/05/2015 PCP: No primary care provider on file.  Assessment/Plan: 1. Severe Iron deficiency Anemia 1. Presenting hgb in the 3 range with markedly low serum iron levels 2. Last menstrual cycle ended on the day of admission, however pt denies menorrhagia 3. CT abd demonstrates evidence of chronic inflammation of sigmoid colon 4. Pt also reports bouts of diarrhea with certain foods. Question possible celiac disease. 5. TTG ab obtained and is pending 6. Discussed case with [facility], Dr. Oletta Lamas, who recommends outpatient follow up when discharged 7. For now, cont to transfuse as tolerated to keep hgb>7 and will give IV iron 8. Have consulted dietitian for possible celiac disease 2. Bacterial vaginosis 1. Mod clue cells on wet prep 2. Will continue on flagyl, anticipate tx for total 7 days 3. R sided renal calculi 1. 4mm R sided renal calculi 2. Symptoms much improved with toradol (renal function normal) 3. Have started flomax 4. DVT prophylaxis 1. SCD's while admitted  Code Status: Full Family Communication: Pt in room, family at bedside Disposition Plan: Pending   Consultants:    Procedures:    Antibiotics: Anti-infectives    Start     Dose/Rate Route Frequency Ordered Stop   01/05/15 1400  metroNIDAZOLE (FLAGYL) tablet 500 mg     500 mg Oral 3 times per day 01/05/15 1034     01/05/15 0345  cefTRIAXone (ROCEPHIN) 1 g in dextrose 5 % 50 mL IVPB  Status:  Discontinued     1 g 100 mL/hr over 30 Minutes Intravenous Every 24 hours 01/05/15 0343 01/05/15 1034       HPI/Subjective: Feels better today  Objective: Filed Vitals:   01/05/15 0611 01/05/15 0741 01/05/15 0817 01/05/15 1231  BP: 121/75 115/71 118/58 106/65  Pulse: 80 63 78 80  Temp: 98.3 F (36.8 C) 97.9 F (36.6 C) 98.8 F (37.1 C) 98.8 F (37.1 C)  TempSrc: Oral Oral Oral Oral  Resp: 20 20 20 18   Height:       Weight:      SpO2: 100% 100% 100% 100%    Intake/Output Summary (Last 24 hours) at 01/05/15 1540 Last data filed at 01/05/15 1151  Gross per 24 hour  Intake 973.23 ml  Output      3 ml  Net 970.23 ml   Filed Weights   01/05/15 0005  Weight: 57.607 kg (127 lb)    Exam:   General:  Awake, in nad  Cardiovascular: regular, s1, s2  Respiratory: normal resp effort  Abdomen: soft, nondistended  Musculoskeletal: perfused, no clubbing   Data Reviewed: Basic Metabolic Panel:  Recent Labs Lab 01/05/15 0027 01/05/15 0207  NA 137 140  K 3.4* 3.4*  CL 105 105  CO2 24  --   GLUCOSE 113* 104*  BUN 9 10  CREATININE 0.77 0.70  CALCIUM 8.6*  --    Liver Function Tests: No results for input(s): AST, ALT, ALKPHOS, BILITOT, PROT, ALBUMIN in the last 168 hours. No results for input(s): LIPASE, AMYLASE in the last 168 hours. No results for input(s): AMMONIA in the last 168 hours. CBC:  Recent Labs Lab 01/05/15 0027 01/05/15 0204 01/05/15 0207  WBC 8.0 9.1  --   NEUTROABS  --  4.7  --   HGB 3.8* 3.7* 5.8*  HCT 16.5* 15.9* 17.0*  MCV 58.5* 58.7*  --   PLT 833* 797*  --    Cardiac Enzymes: No results for input(s):  CKTOTAL, CKMB, CKMBINDEX, TROPONINI in the last 168 hours. BNP (last 3 results) No results for input(s): BNP in the last 8760 hours.  ProBNP (last 3 results) No results for input(s): PROBNP in the last 8760 hours.  CBG: No results for input(s): GLUCAP in the last 168 hours.  Recent Results (from the past 240 hour(s))  Wet prep, genital     Status: Abnormal   Collection Time: 01/05/15  3:33 AM  Result Value Ref Range Status   Yeast Wet Prep HPF POC NONE SEEN NONE SEEN Final   Trich, Wet Prep NONE SEEN NONE SEEN Final   Clue Cells Wet Prep HPF POC MODERATE (A) NONE SEEN Final   WBC, Wet Prep HPF POC TOO NUMEROUS TO COUNT (A) NONE SEEN Final     Studies: Ct Renal Stone Study  01/05/2015   CLINICAL DATA:  Acute onset of right flank pain. Nausea and  vomiting. Difficulty urinating. Initial encounter.  EXAM: CT ABDOMEN AND PELVIS WITHOUT CONTRAST  TECHNIQUE: Multidetector CT imaging of the abdomen and pelvis was performed following the standard protocol without IV contrast.  COMPARISON:  Abdominal radiograph performed 06/09/2011  FINDINGS: The visualized lung bases are clear.  The liver and spleen are unremarkable in appearance. The gallbladder is within normal limits. The pancreas and adrenal glands are unremarkable.  Mild right-sided hydronephrosis is noted, with prominence of the right ureter along its entire course, and an obstructing 3 mm stone noted distally at the right vesicoureteral junction. The left kidney is unremarkable in appearance. No nonobstructing renal stones are identified. No perinephric stranding is appreciated.  No free fluid is identified. The small bowel is unremarkable in appearance. The stomach is within normal limits. No acute vascular abnormalities are seen.  The appendix is normal in caliber, without evidence of appendicitis. Mild fatty infiltration of the wall of the sigmoid colon suggests mild chronic inflammation. There is loss of the normal haustral pattern of the transverse colon, raising question for mild inflammation.  The bladder is mildly distended and grossly unremarkable. The uterus is unremarkable in appearance. The ovaries are relatively symmetric. No suspicious adnexal masses are seen. No inguinal lymphadenopathy is seen.  No acute osseous abnormalities are identified.  IMPRESSION: 1. Mild right-sided hydronephrosis, with an obstructing 3 mm stone noted distally at the right vesicoureteral junction. 2. Mild fatty infiltration of the wall of the sigmoid colon suggests mild chronic inflammation. Loss of the normal haustral pattern of the transverse colon also raises question for mild inflammation.   Electronically Signed   By: Garald Balding M.D.   On: 01/05/2015 06:34    Scheduled Meds: . sodium chloride  10 mL/hr  Intravenous Once  . iron dextran (INFED/DEXFERRUM) infusion  500 mg Intravenous Daily  . metroNIDAZOLE  500 mg Oral 3 times per day  . polyethylene glycol  17 g Oral Daily  . tamsulosin  0.4 mg Oral Daily   Continuous Infusions:   Principal Problem:   Microcytic hypochromic anemia Active Problems:   Symptomatic anemia    Monica Green K  [facility] Pager [phone]. If 7PM-7AM, please contact night-coverage at [email or web address], password [facility] 01/05/2015, 3:40 PM  LOS: 0 days

## 2015-01-05 NOTE — ED Notes (Signed)
Blood verified with Maren Reamer, RN.

## 2015-01-05 NOTE — Progress Notes (Deleted)
Attempted to get report on patient. ED nurse is to call back.

## 2015-01-05 NOTE — Progress Notes (Signed)
Pt. Requesting Pain med for R flank pain 7/10. Text page sent to MD, Wyline Copas.

## 2015-01-05 NOTE — ED Notes (Signed)
Gardener, DO at bedside.  

## 2015-01-05 NOTE — ED Provider Notes (Addendum)
CSN: [id number]   Arrival date & time 01/04/15 2357  History  By signing my name below, I, Altamease Oiler, attest that this documentation has been prepared under the direction and in the presence of Varney Biles, MD b. Electronically Signed: Altamease Oiler, ED Scribe. 01/05/2015. 4:20 AM.  Chief Complaint  Patient presents with  . Flank Pain    HPI The history is provided by the patient. No language interpreter was used.   Monica Green is a 29 y.o. female with PMHx of IBS who presents to the Emergency Department complaining of right flank pain with onset this evening around 8:30 PM. She describes the pain as sharp and rated 7/10 in severity . This pain is non-pleuritic. Associated symptoms include difficulty urinating (last void around 7 PM), nausea and vomiting (4 non-bloody episodes). Pt denies dizziness, light headedness, cough, dysuria, hematuria, diarrhea, hematochezia, and abnormal vaginal discharge. Her menstrual period just ended. No history of heavy bleeding. No chance of pregnancy.  No concern for or history of STD. No history of pelvic disease. No history of abdominal surgery.   History reviewed. No pertinent past medical history.  History reviewed. No pertinent past surgical history.  No family history on file.  Social History  Substance Use Topics  . Smoking status: Never Smoker   . Smokeless tobacco: None  . Alcohol Use: Yes     Review of Systems  Respiratory: Negative for cough.   Gastrointestinal: Positive for nausea and vomiting. Negative for diarrhea and blood in stool.  Genitourinary: Positive for flank pain and difficulty urinating. Negative for dysuria and vaginal discharge.  Neurological: Negative for dizziness and light-headedness.  All other systems reviewed and are negative.  Home Medications   Prior to Admission medications   Medication Sig Start Date End Date Taking? Authorizing Provider  ibuprofen (ADVIL,MOTRIN) 200 MG tablet Take 400 mg  by mouth every 6 (six) hours as needed for mild pain.   Yes Historical Provider, MD    Allergies  Review of patient's allergies indicates no known allergies.  Triage Vitals: BP 117/72 mmHg  Pulse 107  Temp(Src) 98 F (36.7 C) (Oral)  Resp 18  Ht 5\' 4"  (1.626 m)  Wt 127 lb (57.607 kg)  BMI 21.79 kg/m2  SpO2 100%  LMP 12/29/2014 (Approximate)  Physical Exam  Constitutional: She is oriented to person, place, and time. She appears well-developed and well-nourished. No distress.  HENT:  Head: Normocephalic and atraumatic.  Neck: Normal range of motion.  Cardiovascular: Normal rate and regular rhythm.   Pulmonary/Chest: Effort normal. No respiratory distress.  CTAB  Abdominal: Soft. There is tenderness. There is no rebound and no guarding.  Right flank tenderness but non-tender otherwise Negative Murphy's Negative Mcburney's   Musculoskeletal: Normal range of motion.  Neurological: She is alert and oriented to person, place, and time.  Skin: Skin is warm and dry. She is not diaphoretic.  Psychiatric: She has a normal mood and affect.  Nursing note and vitals reviewed.   ED Course  Procedures  Angiocath insertion Performed by: Varney Biles  Consent: Verbal consent obtained. Risks and benefits: risks, benefits and alternatives were discussed Time out: Immediately prior to procedure a "time out" was called to verify the correct patient, procedure, equipment, support staff and site/side marked as required.  Preparation: Patient was prepped and draped in the usual sterile fashion.  Vein Location: Right antecubital fossa Ultrasound Guided  Gauge: 20 gauge  Normal blood return and flush without difficulty Patient tolerance: Patient tolerated the  procedure well with no immediate complications.   CRITICAL CARE Performed by: Varney Biles   Total critical care time: 40 min  Critical care time was exclusive of separately billable procedures and treating other  patients.  Critical care was necessary to treat or prevent imminent or life-threatening deterioration.  Critical care was time spent personally by me on the following activities: development of treatment plan with patient and/or surrogate as well as nursing, discussions with consultants, evaluation of patient's response to treatment, examination of patient, obtaining history from patient or surrogate, ordering and performing treatments and interventions, ordering and review of laboratory studies, ordering and review of radiographic studies, pulse oximetry and re-evaluation of patient's condition.    DIAGNOSTIC STUDIES: Oxygen Saturation is 100% on RA, normal by my interpretation.    COORDINATION OF CARE: 1:36 AM Discussed treatment plan which includes lab work, blood transfusion, and admission to the hospital with pt at bedside and pt agreed to plan.  Labs Reviewed  WET PREP, GENITAL - Abnormal; Notable for the following:    Clue Cells Wet Prep HPF POC MODERATE (*)    WBC, Wet Prep HPF POC TOO NUMEROUS TO COUNT (*)    All other components within normal limits  BASIC METABOLIC PANEL - Abnormal; Notable for the following:    Potassium 3.4 (*)    Glucose, Bld 113 (*)    Calcium 8.6 (*)    All other components within normal limits  CBC - Abnormal; Notable for the following:    RBC 2.82 (*)    Hemoglobin 3.8 (*)    HCT 16.5 (*)    MCV 58.5 (*)    MCH 13.5 (*)    MCHC 23.0 (*)    RDW 21.4 (*)    Platelets 833 (*)    All other components within normal limits  URINALYSIS, ROUTINE W REFLEX MICROSCOPIC (NOT AT [facility]) - Abnormal; Notable for the following:    APPearance TURBID (*)    pH 8.5 (*)    Ketones, ur 15 (*)    Protein, ur 30 (*)    Leukocytes, UA MODERATE (*)    All other components within normal limits  URINE MICROSCOPIC-ADD ON - Abnormal; Notable for the following:    Bacteria, UA MANY (*)    All other components within normal limits  CBC WITH DIFFERENTIAL/PLATELET -  Abnormal; Notable for the following:    RBC 2.71 (*)    Hemoglobin 3.7 (*)    HCT 15.9 (*)    MCV 58.7 (*)    MCH 13.7 (*)    MCHC 23.3 (*)    RDW 21.5 (*)    Platelets 797 (*)    Monocytes Absolute 1.2 (*)    Eosinophils Absolute 1.5 (*)    Basophils Absolute 0.2 (*)    All other components within normal limits  RETICULOCYTES - Abnormal; Notable for the following:    RBC. 2.87 (*)    All other components within normal limits  I-STAT CHEM 8, ED - Abnormal; Notable for the following:    Potassium 3.4 (*)    Glucose, Bld 104 (*)    Hemoglobin 5.8 (*)    HCT 17.0 (*)    All other components within normal limits  URINE CULTURE  VITAMIN B12  FOLATE  IRON AND TIBC  FERRITIN  OCCULT BLOOD X 1 CARD TO LAB, STOOL  POC URINE PREG, ED  POC OCCULT BLOOD, ED  TYPE AND SCREEN  ABO/RH  PREPARE RBC (CROSSMATCH)  GC/CHLAMYDIA PROBE AMP (  [place]) NOT AT [facility]    Imaging Review No results found.  EKG Interpretation  Date/Time:    Ventricular Rate:    PR Interval:    QRS Duration:   QT Interval:    QTC Calculation:   R Axis:     Text Interpretation:      MDM   Final diagnoses:  Acute right flank pain  Symptomatic anemia      I personally performed the services described in this documentation, which was scribed in my presence. The recorded information has been reviewed and is accurate.   Pt comes in with cc of R flank pain. Acute pain, with no uti like sx. She has no vaginal discharge, bleeding and no risk factors for STD. She has no uti like sx. There are bacteria in the urine - we will get pelvic exam.  Pt also has anemia. She is slightly tachycardic. Does endorse fatigue - but no other symptoms. I repeated her cbc and the anemia is confirmed.  Will admit. Occult stool ordered. Pt denies menorrhagia or melena.  Varney Biles, MD 01/05/15 9417  Varney Biles, MD 01/05/15 0301  4:21 AM Pelvic exam was benign. + flank pain continues. UA and wet prep  has WBC and bacteria, but pt has no uti like sx. We will get CT abdomen and pelvic with contrast, and i dont think it is best to assume pt has pyelo w/o her having any uti like sx. Will admit for anemia workup.  Varney Biles, MD 01/05/15 [phone]

## 2015-01-06 DIAGNOSIS — B9689 Other specified bacterial agents as the cause of diseases classified elsewhere: Secondary | ICD-10-CM | POA: Diagnosis present

## 2015-01-06 DIAGNOSIS — N760 Acute vaginitis: Secondary | ICD-10-CM

## 2015-01-06 DIAGNOSIS — N201 Calculus of ureter: Secondary | ICD-10-CM | POA: Diagnosis present

## 2015-01-06 DIAGNOSIS — D509 Iron deficiency anemia, unspecified: Principal | ICD-10-CM

## 2015-01-06 LAB — URINE CULTURE

## 2015-01-06 LAB — BASIC METABOLIC PANEL
ANION GAP: 9 (ref 5–15)
BUN: 8 mg/dL (ref 6–20)
CALCIUM: 8.3 mg/dL — AB (ref 8.9–10.3)
CHLORIDE: 103 mmol/L (ref 101–111)
CO2: 22 mmol/L (ref 22–32)
CREATININE: 1.02 mg/dL — AB (ref 0.44–1.00)
GFR calc non Af Amer: >60 mL/min (ref >60)
Glucose, Bld: 77 mg/dL (ref 65–99)
Potassium: 4 mmol/L (ref 3.5–5.1)
SODIUM: 134 mmol/L — AB (ref 135–145)

## 2015-01-06 LAB — HEMOGLOBIN AND HEMATOCRIT, BLOOD
HCT: 28.7 % — ABNORMAL LOW (ref 36.0–46.0)
Hemoglobin: 8.4 g/dL — ABNORMAL LOW (ref 12.0–15.0)

## 2015-01-06 LAB — TISSUE TRANSGLUTAMINASE, IGA: Tissue Transglutaminase Ab, IgA: <2 U/mL (ref 0–3)

## 2015-01-06 MED ORDER — ONDANSETRON 4 MG PO TBDP: 4.0000 mg | ORAL_TABLET | Freq: Three times a day (TID) | ORAL | Status: DC | PRN

## 2015-01-06 MED ORDER — OXYCODONE HCL 5 MG PO TABS: 5.0000 mg | ORAL_TABLET | ORAL | Status: DC | PRN

## 2015-01-06 MED ORDER — POLYETHYLENE GLYCOL 3350 17 G PO PACK: 17.0000 g | PACK | Freq: Every day | ORAL | Status: DC | PRN

## 2015-01-06 MED ORDER — FERROUS SULFATE 325 (65 FE) MG PO TABS: 325.0000 mg | ORAL_TABLET | Freq: Two times a day (BID) | ORAL | Status: DC

## 2015-01-06 MED ORDER — TAMSULOSIN HCL 0.4 MG PO CAPS: 0.4000 mg | ORAL_CAPSULE | Freq: Every day | ORAL | Status: DC

## 2015-01-06 MED ORDER — METRONIDAZOLE 500 MG PO TABS: 500.0000 mg | ORAL_TABLET | Freq: Three times a day (TID) | ORAL | Status: DC

## 2015-01-06 NOTE — Care Management Note (Signed)
Case Management Note  Patient Details  Name: Monica Green MRN: [id number] Date of Birth: [birth year]  Subjective/Objective:   Admitted with Microcytic hypochromic anemia                 Action/Plan: Patient is independent of ADL's and is between jobs. No insurance- patient is planning to start a new job with benefits soon. No PCP- patient is agreeable to go to the [facility] for follow up care. Also patient cannot afford her medication- she is approved for the [place] [facility] ( Medication Assistance Through [facility]). Program explained to the patient, she can only use this fund once a year.   Expected Discharge Date:    01/06/2015              Expected Discharge Plan:  Home/Self Care  Discharge planning Services  CM Consult  Status of Service:  Completed, signed off  Sherrilyn Rist [phone] 01/06/2015, 1:58 PM

## 2015-01-06 NOTE — Discharge Summary (Signed)
[facility]  Physician Discharge Summary   Patient ID: Monica Green MRN: [id number] DOB/AGE: 1985/11/24 29 y.o.  Admit date: 01/05/2015 Discharge date: 01/06/2015  PCP: No primary care provider on file.  DISCHARGE DIAGNOSES:  Principal Problem:   Microcytic hypochromic anemia Active Problems:   Symptomatic anemia   RECOMMENDATIONS FOR OUTPATIENT FOLLOW UP: 1. CBC and basic metabolic panel next week   DISCHARGE CONDITION: fair  Diet recommendation: regular  Filed Weights   01/05/15 0005 01/06/15 0409  Weight: 57.607 kg (127 lb) 58.469 kg (128 lb 14.4 oz)    INITIAL HISTORY: 29 year old African-American female with a past medical history of irritable bowel syndrome, presented to the emergency department with complains of right-sided flank pain. She was found severe anemia with a hemoglobin of 3.7. She was also noted to have ureterolithiasis.  Consultations:  Phone discussion with [facility] gastroenterology, Dr. Oletta Lamas  Procedures:  none  HOSPITAL COURSE:   Severe iron deficiency anemia No history suggestive of bleeding. Patient had been weak and fatigued for many weeks. Anemia panel suggested iron deficiency. Patient was transfused 2 units of PRBCs. She was given intravenous iron infusion. She will need further workup for her iron deficiency anemia. She does mention history of diarrhea, especially when exposed to gluten. It's possible she may have celiac disease. Patient to follow-up with gastroenterology. Hemoglobin responded to the above measures. It is 8.4 at the time of discharge.  Right ureterolithiasis Causing flank pain. CT was done which revealed stone at the right vesicoureteral junction. Patient was given Flomax. Pain is much better. Quite possible she passed it. She is tolerating her diet. She was asked to call the urologist if she continues to have discomfort in 3-4 days.  Bacterial vaginosis Treating with metronidazole. This will need to be  further discussed and outpatient follow-up.  Overall, patient has improved. She is keen on going home. All of the above discussed with the patient as well as her father with her permission and at her request.   PERTINENT LABS:  The results of significant diagnostics from this hospitalization (including imaging, microbiology, ancillary and laboratory) are listed below for reference.    Microbiology: Recent Results (from the past 240 hour(s))  Urine culture     Status: None   Collection Time: 01/05/15  1:15 AM  Result Value Ref Range Status   Specimen Description URINE, RANDOM  Final   Special Requests NONE  Final   Culture MULTIPLE SPECIES PRESENT, SUGGEST RECOLLECTION  Final   Report Status 01/06/2015 FINAL  Final  Wet prep, genital     Status: Abnormal   Collection Time: 01/05/15  3:33 AM  Result Value Ref Range Status   Yeast Wet Prep HPF POC NONE SEEN NONE SEEN Final   Trich, Wet Prep NONE SEEN NONE SEEN Final   Clue Cells Wet Prep HPF POC MODERATE (A) NONE SEEN Final   WBC, Wet Prep HPF POC TOO NUMEROUS TO COUNT (A) NONE SEEN Final     Labs: Basic Metabolic Panel:  Recent Labs Lab 01/05/15 0027 01/05/15 0207 01/06/15 1150  NA 137 140 134*  K 3.4* 3.4* 4.0  CL 105 105 103  CO2 24  --  22  GLUCOSE 113* 104* 77  BUN 9 10 8   CREATININE 0.77 0.70 1.02*  CALCIUM 8.6*  --  8.3*   CBC:  Recent Labs Lab 01/05/15 0027 01/05/15 0204 01/05/15 0207 01/05/15 1458 01/06/15 0945  WBC 8.0 9.1  --  9.0  --   NEUTROABS  --  4.7  --   --   --   HGB 3.8* 3.7* 5.8* 8.2* 8.4*  HCT 16.5* 15.9* 17.0* 28.5* 28.7*  MCV 58.5* 58.7*  --  67.9*  --   PLT 833* 797*  --  682*  --     IMAGING STUDIES Ct Renal Stone Study  01/05/2015   CLINICAL DATA:  Acute onset of right flank pain. Nausea and vomiting. Difficulty urinating. Initial encounter.  EXAM: CT ABDOMEN AND PELVIS WITHOUT CONTRAST  TECHNIQUE: Multidetector CT imaging of the abdomen and pelvis was performed following the  standard protocol without IV contrast.  COMPARISON:  Abdominal radiograph performed 06/09/2011  FINDINGS: The visualized lung bases are clear.  The liver and spleen are unremarkable in appearance. The gallbladder is within normal limits. The pancreas and adrenal glands are unremarkable.  Mild right-sided hydronephrosis is noted, with prominence of the right ureter along its entire course, and an obstructing 3 mm stone noted distally at the right vesicoureteral junction. The left kidney is unremarkable in appearance. No nonobstructing renal stones are identified. No perinephric stranding is appreciated.  No free fluid is identified. The small bowel is unremarkable in appearance. The stomach is within normal limits. No acute vascular abnormalities are seen.  The appendix is normal in caliber, without evidence of appendicitis. Mild fatty infiltration of the wall of the sigmoid colon suggests mild chronic inflammation. There is loss of the normal haustral pattern of the transverse colon, raising question for mild inflammation.  The bladder is mildly distended and grossly unremarkable. The uterus is unremarkable in appearance. The ovaries are relatively symmetric. No suspicious adnexal masses are seen. No inguinal lymphadenopathy is seen.  No acute osseous abnormalities are identified.  IMPRESSION: 1. Mild right-sided hydronephrosis, with an obstructing 3 mm stone noted distally at the right vesicoureteral junction. 2. Mild fatty infiltration of the wall of the sigmoid colon suggests mild chronic inflammation. Loss of the normal haustral pattern of the transverse colon also raises question for mild inflammation.   Electronically Signed   By: Garald Balding M.D.   On: 01/05/2015 06:34    DISCHARGE EXAMINATION: Filed Vitals:   01/05/15 2050 01/05/15 2351 01/06/15 0409 01/06/15 1206  BP: 104/61 102/60 106/66 115/68  Pulse: 77 90 81 87  Temp: 98.2 F (36.8 C) 98.3 F (36.8 C) 98.2 F (36.8 C) 98.1 F (36.7 C)    TempSrc: Oral Oral Oral Oral  Resp: 17 16 16 20   Height:      Weight:   58.469 kg (128 lb 14.4 oz)   SpO2: 100% 100% 99% 100%   General appearance: alert, cooperative, appears stated age and no distress Resp: clear to auscultation bilaterally Cardio: regular rate and rhythm, S1, S2 normal, no murmur, click, rub or gallop GI: soft, non-tender; bowel sounds normal; no masses,  no organomegaly Extremities: extremities normal, atraumatic, no cyanosis or edema Neurologic: Alert and oriented X 3, normal strength and tone. Normal symmetric reflexes. Normal coordination and gait  DISPOSITION: home  Discharge Instructions    Call MD for:  difficulty breathing, headache or visual disturbances    Complete by:  As directed      Call MD for:  extreme fatigue    Complete by:  As directed      Call MD for:  persistant dizziness or light-headedness    Complete by:  As directed      Call MD for:  persistant nausea and vomiting    Complete by:  As directed  Call MD for:  severe uncontrolled pain    Complete by:  As directed      Call MD for:  temperature >100.4    Complete by:  As directed      Diet general    Complete by:  As directed      Discharge instructions    Complete by:  As directed   Please call the urologist if you continue to have abdominal pain and difficulty with urination in 3-4 days. Please call the gastroenterologist as discussed. You will need to have repeat blood work next week to check your hemoglobin, as well as your kidneys. Continue to remain well hydrated. Seek attention if unable to pass urine or notice blood in the urine.    You were cared for by a hospitalist during your hospital stay. If you have any questions about your discharge medications or the care you received while you were in the hospital after you are discharged, you can call the unit and asked to speak with the hospitalist on call if the hospitalist that took care of you is not available. Once you are  discharged, your primary care physician will handle any further medical issues. Please note that NO REFILLS for any discharge medications will be authorized once you are discharged, as it is imperative that you return to your primary care physician (or establish a relationship with a primary care physician if you do not have one) for your aftercare needs so that they can reassess your need for medications and monitor your lab values. If you do not have a primary care physician, you can call [phone] for a physician referral.     Increase activity slowly    Complete by:  As directed            ALLERGIES: No Known Allergies   Discharge Medication List as of 01/06/2015  2:42 PM    START taking these medications   Details  ferrous sulfate (FEOSOL) 325 (65 FE) MG tablet Take 1 tablet (325 mg total) by mouth 2 (two) times daily with a meal., Starting 01/06/2015, Until Discontinued, Print    metroNIDAZOLE (FLAGYL) 500 MG tablet Take 1 tablet (500 mg total) by mouth every 8 (eight) hours. For 6 more days, Starting 01/06/2015, Until Discontinued, Print    ondansetron (ZOFRAN ODT) 4 MG disintegrating tablet Take 1 tablet (4 mg total) by mouth every 8 (eight) hours as needed for nausea or vomiting., Starting 01/06/2015, Until Discontinued, Print    oxyCODONE (OXY IR/ROXICODONE) 5 MG immediate release tablet Take 1 tablet (5 mg total) by mouth every 4 (four) hours as needed for severe pain., Starting 01/06/2015, Until Discontinued, Print    polyethylene glycol (MIRALAX / GLYCOLAX) packet Take 17 g by mouth daily as needed for moderate constipation., Starting 01/06/2015, Until Discontinued, Print    tamsulosin (FLOMAX) 0.4 MG CAPS capsule Take 1 capsule (0.4 mg total) by mouth daily., Starting 01/06/2015, Until Discontinued, Print      STOP taking these medications     ibuprofen (ADVIL,MOTRIN) 200 MG tablet        Follow-up Information    Follow up with Winfield Cunas, MD On 02/02/2015.    Specialty:  Gastroenterology   Why:  @ 10:15 AM for further work up of iron deficiency/possible celiac disease. We spoke to Dr. Oletta Lamas while you were hospitalized.Self-pay payment of $123.20 expected at the time of service. If the patient is unable pay the appoinment needs to be reschedule   Contact  information:   [address] [place] [address] [phone]       Follow up with Alexis Frock, MD On 01/20/2015.   Specialty:  Urology   Why:  @ 2:15 PM with Dr. Nyoka Cowden call if you continue to have pain related to stone even after 3-4 days. Payment arrangement can be made if the need arises. Confirmed appontment with Gwen.   Contact information:   [address] [place] [address] [phone]       Follow up with [facility] On 01/28/2015.   Why:  @ 2:15 PM confirmed appointment with Annye English information:   [place] [phone]      TOTAL DISCHARGE TIME: 29 minutes  [facility] Pager [phone]  01/06/2015, 3:41 PM

## 2015-01-06 NOTE — Progress Notes (Signed)
Cardiac monitor discontinued. CCMD notified. Home discharge instructions discussed with patient and mother (via telephone). Discussed diet, follow up appts, activity and medications. Prescriptions for flagyl, Oxycodone, Flomax, Miralax, Zofran, and iron given to patient. Patient to take medications to get filled at [place]. Patient verbally understands instructions with the use of teach back.

## 2015-01-06 NOTE — Progress Notes (Signed)
Patient taken out of hospital for discharge via wheelchair.  

## 2015-01-06 NOTE — Discharge Instructions (Signed)
Kidney Stones °Kidney stones (urolithiasis) are deposits that form inside your kidneys. The intense pain is caused by the stone moving through the urinary tract. When the stone moves, the ureter goes into spasm around the stone. The stone is usually passed in the urine.  °CAUSES  °· A disorder that makes certain neck glands produce too much parathyroid hormone (primary hyperparathyroidism). °· A buildup of uric acid crystals, similar to gout in your joints. °· Narrowing (stricture) of the ureter. °· A kidney obstruction present at birth (congenital obstruction). °· Previous surgery on the kidney or ureters. °· Numerous kidney infections. °SYMPTOMS  °· Feeling sick to your stomach (nauseous). °· Throwing up (vomiting). °· Blood in the urine (hematuria). °· Pain that usually spreads (radiates) to the groin. °· Frequency or urgency of urination. °DIAGNOSIS  °· Taking a history and physical exam. °· Blood or urine tests. °· CT scan. °· Occasionally, an examination of the inside of the urinary bladder (cystoscopy) is performed. °TREATMENT  °· Observation. °· Increasing your fluid intake. °· Extracorporeal shock wave lithotripsy--This is a noninvasive procedure that uses shock waves to break up kidney stones. °· Surgery may be needed if you have severe pain or persistent obstruction. There are various surgical procedures. Most of the procedures are performed with the use of small instruments. Only small incisions are needed to accommodate these instruments, so recovery time is minimized. °The size, location, and chemical composition are all important variables that will determine the proper choice of action for you. Talk to your health care provider to better understand your situation so that you will minimize the risk of injury to yourself and your kidney.  °HOME CARE INSTRUCTIONS  °· Drink enough water and fluids to keep your urine clear or pale yellow. This will help you to pass the stone or stone fragments. °· Strain  all urine through the provided strainer. Keep all particulate matter and stones for your health care provider to see. The stone causing the pain may be as small as a grain of salt. It is very important to use the strainer each and every time you pass your urine. The collection of your stone will allow your health care provider to analyze it and verify that a stone has actually passed. The stone analysis will often identify what you can do to reduce the incidence of recurrences. °· Only take over-the-counter or prescription medicines for pain, discomfort, or fever as directed by your health care provider. °· Keep all follow-up visits as told by your health care provider. This is important. °· Get follow-up X-rays if required. The absence of pain does not always mean that the stone has passed. It may have only stopped moving. If the urine remains completely obstructed, it can cause loss of kidney function or even complete destruction of the kidney. It is your responsibility to make sure X-rays and follow-ups are completed. Ultrasounds of the kidney can show blockages and the status of the kidney. Ultrasounds are not associated with any radiation and can be performed easily in a matter of minutes. °· Make changes to your daily diet as told by your health care provider. You may be told to: °¨ Limit the amount of salt that you eat. °¨ Eat 5 or more servings of fruits and vegetables each day. °¨ Limit the amount of meat, poultry, fish, and eggs that you eat. °· Collect a 24-hour urine sample as told by your health care provider. You may need to collect another urine sample every 6-12   months. SEEK MEDICAL CARE IF:  You experience pain that is progressive and unresponsive to any pain medicine you have been prescribed. SEEK IMMEDIATE MEDICAL CARE IF:   Pain cannot be controlled with the prescribed medicine.  You have a fever or shaking chills.  The severity or intensity of pain increases over 18 hours and is not  relieved by pain medicine.  You develop a new onset of abdominal pain.  You feel faint or pass out.  You are unable to urinate.   This information is not intended to replace advice given to you by your health care provider. Make sure you discuss any questions you have with your health care provider.   Document Released: 03/20/2005 Document Revised: 12/09/2014 Document Reviewed: 08/21/2012 Elsevier Interactive Patient Education 2016 [employer].   Iron Deficiency Anemia, Adult Anemia is a condition in which there are less red blood cells or hemoglobin in the blood than normal. Hemoglobin is the part of red blood cells that carries oxygen. Iron deficiency anemia is anemia caused by too little iron. It is the most common type of anemia. It may leave you tired and short of breath. CAUSES   Lack of iron in the diet.  Poor absorption of iron, as seen with intestinal disorders.  Intestinal bleeding.  Heavy periods. SIGNS AND SYMPTOMS  Mild anemia may not be noticeable. Symptoms may include:  Fatigue.  Headache.  Pale skin.  Weakness.  Tiredness.  Shortness of breath.  Dizziness.  Cold hands and feet.  Fast or irregular heartbeat. DIAGNOSIS  Diagnosis requires a thorough evaluation and physical exam by your health care provider. Blood tests are generally used to confirm iron deficiency anemia. Additional tests may be done to find the underlying cause of your anemia. These may include:  Testing for blood in the stool (fecal occult blood test).  A procedure to see inside the colon and rectum (colonoscopy).  A procedure to see inside the esophagus and stomach (endoscopy). TREATMENT  Iron deficiency anemia is treated by correcting the cause of the deficiency. Treatment may involve:  Adding iron-rich foods to your diet.  Taking iron supplements. Pregnant or breastfeeding women need to take extra iron because their normal diet usually does not provide the required  amount.  Taking vitamins. Vitamin C improves the absorption of iron. Your health care provider may recommend that you take your iron tablets with a glass of orange juice or vitamin C supplement.  Medicines to make heavy menstrual flow lighter.  Surgery. HOME CARE INSTRUCTIONS   Take iron as directed by your health care provider.  If you cannot tolerate taking iron supplements by mouth, talk to your health care provider about taking them through a vein (intravenously) or an injection into a muscle.  For the best iron absorption, iron supplements should be taken on an empty stomach. If you cannot tolerate them on an empty stomach, you may need to take them with food.  Do not drink milk or take antacids at the same time as your iron supplements. Milk and antacids may interfere with the absorption of iron.  Iron supplements can cause constipation. Make sure to include fiber in your diet to prevent constipation. A stool softener may also be recommended.  Take vitamins as directed by your health care provider.  Eat a diet rich in iron. Foods high in iron include liver, lean beef, whole-grain bread, eggs, dried fruit, and dark green leafy vegetables. SEEK IMMEDIATE MEDICAL CARE IF:   You faint. If this happens,  do not drive. Call your local emergency services (911 in [place] if no other help is available.  You have chest pain.  You feel nauseous or vomit.  You have severe or increased shortness of breath with activity.  You feel weak.  You have a rapid heartbeat.  You have unexplained sweating.  You become light-headed when getting up from a chair or bed. MAKE SURE YOU:   Understand these instructions.  Will watch your condition.  Will get help right away if you are not doing well or get worse.   This information is not intended to replace advice given to you by your health care provider. Make sure you discuss any questions you have with your health care provider.   Document  Released: 03/17/2000 Document Revised: 04/10/2014 Document Reviewed: 11/25/2012 Elsevier Interactive Patient Education [employer].

## 2015-01-07 LAB — TYPE AND SCREEN
ABO/RH(D): B POS
ANTIBODY SCREEN: NEGATIVE
UNIT DIVISION: 0
UNIT DIVISION: 0
UNIT DIVISION: 0
Unit division: 0
Unit division: 0

## 2015-01-12 ENCOUNTER — Ambulatory Visit: Payer: Self-pay

## 2015-01-28 ENCOUNTER — Ambulatory Visit: Payer: Self-pay | Admitting: Family Medicine

## 2015-03-21 ENCOUNTER — Inpatient Hospital Stay (HOSPITAL_COMMUNITY): Payer: Self-pay

## 2015-03-21 ENCOUNTER — Encounter (HOSPITAL_COMMUNITY): Payer: Self-pay | Admitting: Emergency Medicine

## 2015-03-21 ENCOUNTER — Inpatient Hospital Stay (HOSPITAL_COMMUNITY)
Admission: EM | Admit: 2015-03-21 | Discharge: 2015-03-27 | DRG: 385 | Disposition: A | Discharge status: Home / Self Care | Payer: Self-pay | Attending: Family Medicine | Admitting: Family Medicine

## 2015-03-21 DIAGNOSIS — K602 Anal fissure, unspecified: Secondary | ICD-10-CM | POA: Diagnosis present

## 2015-03-21 DIAGNOSIS — K625 Hemorrhage of anus and rectum: Secondary | ICD-10-CM | POA: Diagnosis present

## 2015-03-21 DIAGNOSIS — E860 Dehydration: Secondary | ICD-10-CM | POA: Diagnosis present

## 2015-03-21 DIAGNOSIS — R1084 Generalized abdominal pain: Secondary | ICD-10-CM

## 2015-03-21 DIAGNOSIS — K921 Melena: Secondary | ICD-10-CM | POA: Diagnosis present

## 2015-03-21 DIAGNOSIS — Z681 Body mass index (BMI) 19 or less, adult: Secondary | ICD-10-CM

## 2015-03-21 DIAGNOSIS — R197 Diarrhea, unspecified: Secondary | ICD-10-CM

## 2015-03-21 DIAGNOSIS — Z823 Family history of stroke: Secondary | ICD-10-CM

## 2015-03-21 DIAGNOSIS — Z833 Family history of diabetes mellitus: Secondary | ICD-10-CM

## 2015-03-21 DIAGNOSIS — R109 Unspecified abdominal pain: Secondary | ICD-10-CM | POA: Diagnosis present

## 2015-03-21 DIAGNOSIS — D473 Essential (hemorrhagic) thrombocythemia: Secondary | ICD-10-CM | POA: Diagnosis present

## 2015-03-21 DIAGNOSIS — E876 Hypokalemia: Secondary | ICD-10-CM | POA: Diagnosis present

## 2015-03-21 DIAGNOSIS — D649 Anemia, unspecified: Secondary | ICD-10-CM | POA: Diagnosis present

## 2015-03-21 DIAGNOSIS — R609 Edema, unspecified: Secondary | ICD-10-CM

## 2015-03-21 DIAGNOSIS — A09 Infectious gastroenteritis and colitis, unspecified: Secondary | ICD-10-CM

## 2015-03-21 DIAGNOSIS — E43 Unspecified severe protein-calorie malnutrition: Secondary | ICD-10-CM | POA: Diagnosis present

## 2015-03-21 DIAGNOSIS — Z8249 Family history of ischemic heart disease and other diseases of the circulatory system: Secondary | ICD-10-CM

## 2015-03-21 DIAGNOSIS — K6289 Other specified diseases of anus and rectum: Secondary | ICD-10-CM | POA: Diagnosis present

## 2015-03-21 DIAGNOSIS — Z87442 Personal history of urinary calculi: Secondary | ICD-10-CM

## 2015-03-21 DIAGNOSIS — K51911 Ulcerative colitis, unspecified with rectal bleeding: Principal | ICD-10-CM | POA: Diagnosis present

## 2015-03-21 HISTORY — DX: Anemia, unspecified: D64.9

## 2015-03-21 LAB — BASIC METABOLIC PANEL
Anion gap: 14 (ref 5–15)
CHLORIDE: 93 mmol/L — AB (ref 101–111)
CO2: 27 mmol/L (ref 22–32)
Calcium: 8.5 mg/dL — ABNORMAL LOW (ref 8.9–10.3)
Creatinine, Ser: 0.72 mg/dL (ref 0.44–1.00)
GFR calc Af Amer: >60 mL/min (ref >60)
GFR calc non Af Amer: >60 mL/min (ref >60)
GLUCOSE: 116 mg/dL — AB (ref 65–99)
POTASSIUM: 2.9 mmol/L — AB (ref 3.5–5.1)
Sodium: 134 mmol/L — ABNORMAL LOW (ref 135–145)

## 2015-03-21 LAB — CBC
HEMATOCRIT: 39.1 % (ref 36.0–46.0)
Hemoglobin: 12.5 g/dL (ref 12.0–15.0)
MCH: 27.8 pg (ref 26.0–34.0)
MCHC: 32 g/dL (ref 30.0–36.0)
MCV: 87.1 fL (ref 78.0–100.0)
Platelets: 658 10*3/uL — ABNORMAL HIGH (ref 150–400)
RBC: 4.49 MIL/uL (ref 3.87–5.11)
RDW: 13.6 % (ref 11.5–15.5)
WBC: 9.1 10*3/uL (ref 4.0–10.5)

## 2015-03-21 LAB — URINALYSIS, ROUTINE W REFLEX MICROSCOPIC
BILIRUBIN URINE: NEGATIVE
GLUCOSE, UA: NEGATIVE mg/dL
KETONES UR: 40 mg/dL — AB
Nitrite: NEGATIVE
PH: 7 (ref 5.0–8.0)
Protein, ur: NEGATIVE mg/dL
Specific Gravity, Urine: 1.007 (ref 1.005–1.030)

## 2015-03-21 LAB — MAGNESIUM: MAGNESIUM: 1.9 mg/dL (ref 1.7–2.4)

## 2015-03-21 LAB — I-STAT BETA HCG BLOOD, ED (MC, WL, AP ONLY): I-stat hCG, quantitative: <5 m[IU]/mL (ref <5)

## 2015-03-21 LAB — POC OCCULT BLOOD, ED: FECAL OCCULT BLD: POSITIVE — AB

## 2015-03-21 LAB — URINE MICROSCOPIC-ADD ON
BACTERIA UA: NONE SEEN
RBC / HPF: NONE SEEN RBC/hpf (ref 0–5)

## 2015-03-21 LAB — SEDIMENTATION RATE: SED RATE: 49 mm/h — AB (ref 0–22)

## 2015-03-21 LAB — LIPASE, BLOOD: LIPASE: 21 U/L (ref 11–51)

## 2015-03-21 LAB — LACTIC ACID, PLASMA: Lactic Acid, Venous: 1.7 mmol/L (ref 0.5–2.0)

## 2015-03-21 MED ORDER — SODIUM CHLORIDE 0.9 % IV BOLUS (SEPSIS)
1000.0000 mL | Freq: Once | INTRAVENOUS | Status: AC
  Administered 2015-03-21: 1000 mL via INTRAVENOUS

## 2015-03-21 MED ORDER — SODIUM CHLORIDE 0.9 % IV SOLN: INTRAVENOUS | Status: DC

## 2015-03-21 MED ORDER — ENSURE ENLIVE PO LIQD: 237.0000 mL | Freq: Two times a day (BID) | ORAL | Status: DC

## 2015-03-21 MED ORDER — BARIUM SULFATE 2.1 % PO SUSP
ORAL | Status: AC
  Filled 2015-03-21: qty 2

## 2015-03-21 MED ORDER — ONDANSETRON HCL 4 MG/2ML IJ SOLN
4.0000 mg | Freq: Once | INTRAMUSCULAR | Status: AC
  Administered 2015-03-21: 4 mg via INTRAVENOUS

## 2015-03-21 MED ORDER — POTASSIUM CHLORIDE 10 MEQ/100ML IV SOLN
10.0000 meq | Freq: Once | INTRAVENOUS | Status: AC
  Administered 2015-03-21: 10 meq via INTRAVENOUS
  Filled 2015-03-21: qty 100

## 2015-03-21 MED ORDER — HEPARIN SODIUM (PORCINE) 5000 UNIT/ML IJ SOLN
5000.0000 [IU] | Freq: Three times a day (TID) | INTRAMUSCULAR | Status: DC
  Administered 2015-03-22 – 2015-03-26 (×11): 5000 [IU] via SUBCUTANEOUS
  Filled 2015-03-21 (×7): qty 1

## 2015-03-21 MED ORDER — ACETAMINOPHEN 325 MG PO TABS
650.0000 mg | ORAL_TABLET | Freq: Four times a day (QID) | ORAL | Status: DC | PRN
  Administered 2015-03-22 – 2015-03-23 (×2): 650 mg via ORAL
  Filled 2015-03-21 (×4): qty 2

## 2015-03-21 MED ORDER — SODIUM CHLORIDE 0.9 % IV SOLN
Freq: Once | INTRAVENOUS | Status: AC
  Administered 2015-03-21: 150 mL/h via INTRAVENOUS

## 2015-03-21 MED ORDER — POTASSIUM CHLORIDE IN NACL 20-0.9 MEQ/L-% IV SOLN
INTRAVENOUS | Status: DC
  Administered 2015-03-21: via INTRAVENOUS
  Filled 2015-03-21 (×4): qty 1000

## 2015-03-21 MED ORDER — FERROUS SULFATE 325 (65 FE) MG PO TABS
325.0000 mg | ORAL_TABLET | Freq: Two times a day (BID) | ORAL | Status: DC
  Administered 2015-03-22 – 2015-03-27 (×11): 325 mg via ORAL
  Filled 2015-03-21 (×11): qty 1

## 2015-03-21 MED ORDER — SODIUM CHLORIDE 0.9 % IV BOLUS (SEPSIS)
1000.0000 mL | Freq: Once | INTRAVENOUS | Status: AC
Start: 2015-03-21 — End: 2015-03-21
  Administered 2015-03-21: 1000 mL via INTRAVENOUS

## 2015-03-21 MED ORDER — TRAMADOL HCL 50 MG PO TABS: 50.0000 mg | ORAL_TABLET | Freq: Four times a day (QID) | ORAL | Status: DC | PRN

## 2015-03-21 MED ORDER — POTASSIUM CHLORIDE 10 MEQ/100ML IV SOLN
10.0000 meq | INTRAVENOUS | Status: AC
  Administered 2015-03-21: 10 meq via INTRAVENOUS
  Filled 2015-03-21: qty 100

## 2015-03-21 MED ORDER — IOHEXOL 300 MG/ML  SOLN
100.0000 mL | Freq: Once | INTRAMUSCULAR | Status: AC | PRN
  Administered 2015-03-21: 85 mL via INTRAVENOUS

## 2015-03-21 MED ORDER — POTASSIUM CHLORIDE IN NACL 40-0.9 MEQ/L-% IV SOLN
INTRAVENOUS | Status: DC
  Filled 2015-03-21 (×3): qty 1000

## 2015-03-21 MED ORDER — IOHEXOL 300 MG/ML  SOLN
25.0000 mL | Freq: Once | INTRAMUSCULAR | Status: DC | PRN
  Administered 2015-03-21: 25 mL via ORAL

## 2015-03-21 NOTE — Progress Notes (Signed)
Pt arrived to 6e from ed via stretcher. Placed on tele box 9, hr elevated in 120's MD aware and at bedside. Pt. Stable, refuses rectal examination. Sister at bedside.

## 2015-03-21 NOTE — ED Notes (Signed)
EDP at bedside  

## 2015-03-21 NOTE — ED Notes (Signed)
Patient transported to CT 

## 2015-03-21 NOTE — ED Notes (Addendum)
Pt is not drinking contrast - took one sip and she started to vomit.  Called CT and they stated they will switch her contrast to clear.

## 2015-03-21 NOTE — H&P (Signed)
[facility] Admission History and Physical Service Pager: [phone]  Patient name: Monica Green Medical record number: [id number] Date of birth: 04/06/1985 Age: 29 y.o. Gender: female  Primary Care Provider: No primary care provider on file. Consultants: None, will consult GI Code Status: Full  Chief Complaint: hematochezia, generalized weakness  Assessment and Plan: Monica Green is a 29 y.o. female presenting with generalized weakness x 1 weak and rectal bleeding. PMH is significant for IBS, right ureterolithiasis in 01/2015 and severe iron deficiency anemia.  Hematochezia with abdominal pain: With loose, frequent stools since GI appointment 03/16/15. CT abdomen pelvis showed pancolitis. This and prior CT renal study most consistent with Ulcerative Colitis, given loss of haustra and full colonic involvement, ESR 49 Could also consider infectious etiology like viral or bacterial gastroenteritis or pseudomembranous colitis, but she has no leukocytosis or fever. Lipase normal at 21 ruling out pancreatic cause of abdominal pain.  Also less likely mesenteric ischemia given history and exam with no risk factors. She denies being sexually active and beta Hcg is negative with normal WBC ruling out pregnancy or pelvic infection as a cause of her pain. Crohn's is less likely due to continuous distribution, but elevated ESR occurs in both UC and Chron's.  - Consult GI in AM - + FOBT - Trend Hgb - consider adding stool bulking agent after GI eval - Tissue transglutaminase Ab <2 (01/05/15) - Tylenol for discomfort  - F/u GI panel  Tachycardia: HR to 137 in ED, improved with IVFs. Likely secondary to dehydration from GI losses.  - s/p 2L IVF boluses - Lactic acid ordered.  - Continue continuous NaCl 125 mL/hr with 20 KCl and bolus as needed  Hypokalemia- K 2.9 on admission with potential T wave blunting on EKG. Hypokalemia likely due to GI losses - Will  replete and repeat BMP in AM  Anemia: Chronic, stable. Hgb 12.5 with MCV normal at 87.1 (was 67.9 01/06/15).  - Daily CBCs - Anticipate need for colonoscopy with tissue biopsy  Thrombocytosis: Chronic, since 2013 at least. Suspect 2/2 chronic inflammatory process.  - 658 on admission, as high as 833 01/05/15.   Decreased appetite with generalized weakness: For the last 2 weeks but worse last several days. Able to drink liquids but little solid food. - Soft diet, advance as tolerated.   FEN/GI: Soft diet, IVF NS + KCl Prophylaxis: heparin  Disposition: Admit to telemetry, Attending Dr. Gwendlyn Deutscher. Discharge pending GI workup.   History of Present Illness:  Monica Green is a 29 y.o. female presenting with weakness and loose, frequent stools with BRB. She states she felt "dehydrated and weak."  Patient has had issues going to the bathroom, beginnig after shortly after college. She states she stopped passing gas and just feels pressure and has bowel movements instead. She has had increased bowel frequency on and off for years. Two weeks ago, she started feeling like she had to strain a lot with BMs. She saw Dr. Oletta Lamas of [facility] on Tuesday 03/16/15. He performed a digital rectal exam, after which she began to have loose stools with blood; however, she denies that he removed any stool with exam. At [place] appointment, she says she was told she may have a fissure. She has difficulty estimating how often she has been having loose bowel movements but states she has been waking up every hour overnight and has tenesmus during the day. She was on the bedside commode most of her time in the  ED. She reports pain/irritation in her bottom which is improving but denies abdominal pain or cramping. She has had decreased appetite and has been drinking pedialyte. She had nausea last night and emesis x 1 this morning after trying to drink a protein shake. She did not notice blood in her vomit. She reports  subjective fevers, denies chills. She has felt some pressure with urination with less urine output but denies dysuria. She has regular periods. She has tried eating gluten free, which seemed to help a little, but she also was minimally symptomatic on a non-restrictive diet last month.   Of note, she was admitted in October with a hemoglobin to 3.4, after presenting with right flank pain. At that time,  CT renal stone study showed right ureterolithiasis (believed to have passed stone prior to discharge) and mild fatty infiltration of the wall of the sigmoid colon and loss of normal haustral pattern of transverse colon, both suggestive of chronic mild inflammation. She received 2 units PRBCs and an intravenous iron infusion. She was referred to GI but did not go to appointment, as she was feeling better. She is not taking regular iron supplementation for fear of getting constipated.   In the ED, she was tachycardic to 137 but was afebrile and normotensive. EKG showed sinus tachycardia and blunted T waves. Hgb was 12.5, plts 658, potassium 2.9, sodium 134, Magnesium 1.9. No leukocytosis with WBC of 9.1. Sed rate 49. UA with small hgb, 30 ketones, small leuks, negative nitrites; microscopic without RBCs, WBCs or bacteria. FOBT positive. Stool in ED thin liquid with obvious BRB. After receiving 2L IVFs, HR improved to 110s. Beta hcg <5.0. CT abdomen pelvis showed no evidence of bowel obstruction, perforation, abscess or appendicitis. It was positive for moderate circumferential colonic wall thickening extending from cecum to rectum with prominent mesenteric and perirectal space lymph nodes, consistent with ulcerative colitis or possibly pseudomembranous colitis.    Review Of Systems: Per HPI with the following additions: History of eczema. No uveitis or joint pain. Reports raised rash that occurred after blood transfusion during last hospitalization.  Otherwise the remainder of the systems were  negative.  Patient Active Problem List   Diagnosis Date Noted  . Iron deficiency anemia 01/06/2015  . Ureterolithiasis 01/06/2015  . Bacterial vaginosis 01/06/2015  . Microcytic hypochromic anemia 01/05/2015  . Symptomatic anemia 01/05/2015    Past Medical History: Past Medical History  Diagnosis Date  . Anemia     Past Surgical History: History reviewed. No pertinent past surgical history.  Social History: Social History  Substance Use Topics  . Smoking status: Never Smoker   . Smokeless tobacco: None  . Alcohol Use: Yes   Additional social history: Rarely drinks, last had a glass of wine over Thanksgiving. No illegal drug use. Denies sexual activity  Please also refer to relevant sections of EMR.  Family History: History reviewed. No pertinent family history.  Father has hypertension, history of MI and stroke. Diabetes runs on paternal side of family. Maternal grandmother and patient's mom occasionally complain of bowel urgency.  Brother has had a colonoscopy for hemorrhoids, per sisters.   Allergies and Medications: No Known Allergies No current facility-administered medications on file prior to encounter.   Current Outpatient Prescriptions on File Prior to Encounter  Medication Sig Dispense Refill  . ferrous sulfate (FEOSOL) 325 (65 FE) MG tablet Take 1 tablet (325 mg total) by mouth 2 (two) times daily with a meal. 60 tablet 3  . oxyCODONE (OXY IR/ROXICODONE)  5 MG immediate release tablet Take 1 tablet (5 mg total) by mouth every 4 (four) hours as needed for severe pain. 15 tablet 0  . polyethylene glycol (MIRALAX / GLYCOLAX) packet Take 17 g by mouth daily as needed for moderate constipation. 14 each 0  . metroNIDAZOLE (FLAGYL) 500 MG tablet Take 1 tablet (500 mg total) by mouth every 8 (eight) hours. For 6 more days (Patient not taking: Reported on 03/21/2015) 18 tablet 0  . ondansetron (ZOFRAN ODT) 4 MG disintegrating tablet Take 1 tablet (4 mg total) by mouth  every 8 (eight) hours as needed for nausea or vomiting. 20 tablet 0  . tamsulosin (FLOMAX) 0.4 MG CAPS capsule Take 1 capsule (0.4 mg total) by mouth daily. (Patient not taking: Reported on 03/21/2015) 7 capsule 0    Objective: BP 118/90 mmHg  Pulse 127  Resp 16  Ht '5\' 6"'$  (1.676 m)  Wt 118 lb 6.4 oz (53.706 kg)  BMI 19.12 kg/m2  SpO2 100%  LMP 02/28/2015 Exam: General: Thin, tired-appearing young woman in mild discomfort Eyes: PERRL, EOMI, periorbital dark circles ENTM: MMM, no oral lesions, no cervical lymphadenopathy Neck: Supple Cardiovascular: Tachycardic, RR, S1, S2, no m/r/g Respiratory: CTAB, no wheezes or crackles Abdomen: Mildly distended and hyperresonant, soft, diffusely tender, no rebound or guarding MSK: FROM Skin: Normal skin turgor, cold extremities GU: Patient refused. Asked to be able to wait until morning.  Neuro: AOx3. No focal deficits.  Psych: Normal mood and affect  Labs and Imaging: CBC BMET   Recent Labs Lab 03/21/15 1150  WBC 9.1  HGB 12.5  HCT 39.1  PLT 658*    Recent Labs Lab 03/21/15 1150  NA 134*  K 2.9*  CL 93*  CO2 27  BUN <5*  CREATININE 0.72  GLUCOSE 116*  CALCIUM 8.5*     Hillary Corinda Gubler, MD 03/21/2015, 3:37 PM PGY-1, [place] Intern pager: [phone], text pages welcome  I have seen and examined the patient. I have read and agree with the above note. My changes are noted in blue.  Bartow Zylstra A. Lincoln Brigham MD, [facility] Resident PGY-2 Pager [phone]

## 2015-03-21 NOTE — ED Notes (Signed)
Pt c/o generalized weakness x 1 week. Pt reports that she had rectal bleeding Tuesday and was seen by her PMD who told her she had a tear.

## 2015-03-21 NOTE — ED Notes (Signed)
Called CT to notify them that pt finished drinking contrast.

## 2015-03-21 NOTE — ED Notes (Signed)
Called CT to let them know pt finished drinking contrast 

## 2015-03-21 NOTE — ED Notes (Signed)
Called lab to add on Labs

## 2015-03-21 NOTE — ED Notes (Signed)
Pt states nausea while drinking contrast.

## 2015-03-21 NOTE — ED Provider Notes (Signed)
CSN: [id number]     Arrival date & time 03/21/15  1015 History   First MD Initiated Contact with Patient 03/21/15 1126     Chief Complaint  Patient presents with  . Weakness      HPI  She presents for valuation of generalized weakness. Symptoms for the last 4-5 days.  Patient has a history of a hospitalization first week of October that she with a marked anemia. Found to have a hemoglobin of 3.8. Underwent transfusion. Was placed on iron. Also had a kidney stone at the same time. Passed her stone while inpatient without intervention. CT obtained for her renal stone study also showed inflamed segment of her sigmoid colon.  History of previously diagnosed inflammatory bowel disease. States she has "irritable bowel".  Follows GI with Dr. Oletta Lamas. States she saw her physician in the office several days ago some rectal bleeding. Diagnosed with a possible anal fissure. Continues to have frequent thin watery occasionally bloody stools. Abdominal pain. No fevers or chills. Frequent nausea and vomiting.  Past Medical History  Diagnosis Date  . Anemia    History reviewed. No pertinent past surgical history. History reviewed. No pertinent family history. Social History  Substance Use Topics  . Smoking status: Never Smoker   . Smokeless tobacco: None  . Alcohol Use: Yes   OB History    No data available     Review of Systems  Constitutional: Negative for fever, chills, diaphoresis, appetite change and fatigue.  HENT: Negative for mouth sores, sore throat and trouble swallowing.   Eyes: Negative for visual disturbance.  Respiratory: Negative for cough, chest tightness, shortness of breath and wheezing.   Cardiovascular: Negative for chest pain.  Gastrointestinal: Positive for nausea, vomiting and abdominal pain. Negative for diarrhea and abdominal distention.  Endocrine: Negative for polydipsia, polyphagia and polyuria.  Genitourinary: Negative for dysuria, frequency and hematuria.   Musculoskeletal: Negative for gait problem.  Skin: Negative for color change, pallor and rash.  Neurological: Negative for dizziness, syncope, light-headedness and headaches.  Hematological: Does not bruise/bleed easily.  Psychiatric/Behavioral: Negative for behavioral problems and confusion.      Allergies  Review of patient's allergies indicates no known allergies.  Home Medications   Prior to Admission medications   Medication Sig Start Date End Date Taking? Authorizing Provider  diclofenac sodium (VOLTAREN) 1 % GEL Apply 2 g topically 4 (four) times daily.   Yes Historical Provider, MD  ferrous sulfate (FEOSOL) 325 (65 FE) MG tablet Take 1 tablet (325 mg total) by mouth 2 (two) times daily with a meal. 01/06/15  Yes Bonnielee Haff, MD  oxyCODONE (OXY IR/ROXICODONE) 5 MG immediate release tablet Take 1 tablet (5 mg total) by mouth every 4 (four) hours as needed for severe pain. 01/06/15  Yes Bonnielee Haff, MD  polyethylene glycol (MIRALAX / GLYCOLAX) packet Take 17 g by mouth daily as needed for moderate constipation. 01/06/15  Yes Bonnielee Haff, MD  traMADol (ULTRAM) 50 MG tablet Take 50 mg by mouth every 6 (six) hours as needed.   Yes Historical Provider, MD  metroNIDAZOLE (FLAGYL) 500 MG tablet Take 1 tablet (500 mg total) by mouth every 8 (eight) hours. For 6 more days Patient not taking: Reported on 03/21/2015 01/06/15   Bonnielee Haff, MD  ondansetron (ZOFRAN ODT) 4 MG disintegrating tablet Take 1 tablet (4 mg total) by mouth every 8 (eight) hours as needed for nausea or vomiting. 01/06/15   Bonnielee Haff, MD  tamsulosin (FLOMAX) 0.4 MG CAPS capsule Take  1 capsule (0.4 mg total) by mouth daily. Patient not taking: Reported on 03/21/2015 01/06/15   Bonnielee Haff, MD   BP 121/86 mmHg  Pulse 121  Temp(Src) 99.3 F (37.4 C) (Oral)  Resp 19  Ht 5\' 6"  (1.676 m)  Wt 118 lb 6.4 oz (53.706 kg)  BMI 19.12 kg/m2  SpO2 100%  LMP 02/28/2015 Physical Exam  Constitutional: She is  oriented to person, place, and time. She appears well-developed and well-nourished. No distress.  Awake alert. Anxious. Does not appear ill.  HENT:  Head: Normocephalic.  Eyes: Conjunctivae are normal. Pupils are equal, round, and reactive to light. No scleral icterus.  Neck: Normal range of motion. Neck supple. No thyromegaly present.  Cardiovascular: Regular rhythm.  Tachycardia present.  Exam reveals no gallop and no friction rub.   No murmur heard. Pulmonary/Chest: Effort normal and breath sounds normal. No respiratory distress. She has no wheezes. She has no rales.  Abdominal: Soft. Bowel sounds are normal. She exhibits no distension. There is no tenderness. There is no rebound.  [place] tenderness diffusely through the abdomen. No guarding or peritoneal irritation. Patient refuses rectal exam. No ulceration, hemorrhoid, or fissure noted. She is able to produce a stool which is guaiac positive. Thin bloody.  Musculoskeletal: Normal range of motion.  Neurological: She is alert and oriented to person, place, and time.  Skin: Skin is warm and dry. No rash noted.  Psychiatric: She has a normal mood and affect. Her behavior is normal.    ED Course  Procedures (including critical care time) Labs Review Labs Reviewed  BASIC METABOLIC PANEL - Abnormal; Notable for the following:    Sodium 134 (*)    Potassium 2.9 (*)    Chloride 93 (*)    Glucose, Bld 116 (*)    BUN <5 (*)    Calcium 8.5 (*)    All other components within normal limits  CBC - Abnormal; Notable for the following:    Platelets 658 (*)    All other components within normal limits  URINALYSIS, ROUTINE W REFLEX MICROSCOPIC (NOT AT [facility]) - Abnormal; Notable for the following:    APPearance CLOUDY (*)    Hgb urine dipstick SMALL (*)    Ketones, ur 40 (*)    Leukocytes, UA SMALL (*)    All other components within normal limits  URINE MICROSCOPIC-ADD ON - Abnormal; Notable for the following:    Squamous Epithelial / LPF 0-5  (*)    All other components within normal limits  SEDIMENTATION RATE - Abnormal; Notable for the following:    Sed Rate 49 (*)    All other components within normal limits  POC OCCULT BLOOD, ED - Abnormal; Notable for the following:    Fecal Occult Bld POSITIVE (*)    All other components within normal limits  GASTROINTESTINAL PANEL BY PCR, STOOL (REPLACES STOOL CULTURE)  MAGNESIUM  LACTIC ACID, PLASMA  CBG MONITORING, ED  I-STAT BETA HCG BLOOD, ED ([facility], [facility], [facility] ONLY)    Imaging Review No results found. I have personally reviewed and evaluated these images and lab results as part of my medical decision-making.   EKG Interpretation None      MDM   Final diagnoses:  Bloody diarrhea  Generalized abdominal pain    Potassium low 2.9 is likely contributes to her weakness. She is not anemic. Her hemoglobin is 12.5. This may drop and she is hydrated. Renal function shows cranial 0.72. His given IV fluids. Given IV potassium. CT  is requested.  Constellation of bloody stools, abdominal pain, elevated sedimentation rate, and previous abnormal colon on CT think this will likely result in a possible diagnosis of inflammatory bowel disorder, Crohn's or ulcerative colitis. She remains tachycardic after potassium replacement IV fluids. Of requested admission. I discussed the case with the resident on for the medicine. She will be admitted following CT scan.    Tanna Furry, MD 03/21/15 [phone]

## 2015-03-22 DIAGNOSIS — K625 Hemorrhage of anus and rectum: Secondary | ICD-10-CM

## 2015-03-22 DIAGNOSIS — E43 Unspecified severe protein-calorie malnutrition: Secondary | ICD-10-CM | POA: Insufficient documentation

## 2015-03-22 LAB — GASTROINTESTINAL PANEL BY PCR, STOOL (REPLACES STOOL CULTURE)
Adenovirus F40/41: NOT DETECTED
Astrovirus: NOT DETECTED
CAMPYLOBACTER SPECIES: NOT DETECTED
CRYPTOSPORIDIUM: NOT DETECTED
CYCLOSPORA CAYETANENSIS: NOT DETECTED
E. COLI O157: NOT DETECTED
Entamoeba histolytica: NOT DETECTED
Enteroaggregative E coli (EAEC): NOT DETECTED
Enteropathogenic E coli (EPEC): NOT DETECTED
Enterotoxigenic E coli (ETEC): NOT DETECTED
Giardia lamblia: NOT DETECTED
Norovirus GI/GII: NOT DETECTED
PLESIMONAS SHIGELLOIDES: NOT DETECTED
ROTAVIRUS A: NOT DETECTED
SAPOVIRUS (I, II, IV, AND V): NOT DETECTED
SHIGA LIKE TOXIN PRODUCING E COLI (STEC): NOT DETECTED
SHIGELLA/ENTEROINVASIVE E COLI (EIEC): NOT DETECTED
Salmonella species: NOT DETECTED
VIBRIO SPECIES: NOT DETECTED
Vibrio cholerae: NOT DETECTED
YERSINIA ENTEROCOLITICA: NOT DETECTED

## 2015-03-22 LAB — CBC
HCT: 30.8 % — ABNORMAL LOW (ref 36.0–46.0)
Hemoglobin: 10 g/dL — ABNORMAL LOW (ref 12.0–15.0)
MCH: 28.5 pg (ref 26.0–34.0)
MCHC: 32.5 g/dL (ref 30.0–36.0)
MCV: 87.7 fL (ref 78.0–100.0)
PLATELETS: 340 10*3/uL (ref 150–400)
RBC: 3.51 MIL/uL — AB (ref 3.87–5.11)
RDW: 13.8 % (ref 11.5–15.5)
WBC: 7.8 10*3/uL (ref 4.0–10.5)

## 2015-03-22 LAB — COMPREHENSIVE METABOLIC PANEL
ALT: 7 U/L — AB (ref 14–54)
AST: 11 U/L — AB (ref 15–41)
Albumin: 1.6 g/dL — ABNORMAL LOW (ref 3.5–5.0)
Alkaline Phosphatase: 43 U/L (ref 38–126)
Anion gap: 6 (ref 5–15)
BILIRUBIN TOTAL: 0.5 mg/dL (ref 0.3–1.2)
CO2: 24 mmol/L (ref 22–32)
CREATININE: 0.54 mg/dL (ref 0.44–1.00)
Calcium: 7.5 mg/dL — ABNORMAL LOW (ref 8.9–10.3)
Chloride: 107 mmol/L (ref 101–111)
Glucose, Bld: 116 mg/dL — ABNORMAL HIGH (ref 65–99)
Potassium: 3 mmol/L — ABNORMAL LOW (ref 3.5–5.1)
Sodium: 137 mmol/L (ref 135–145)
TOTAL PROTEIN: 5.1 g/dL — AB (ref 6.5–8.1)

## 2015-03-22 LAB — C-REACTIVE PROTEIN: CRP: 21.4 mg/dL — AB (ref <1.0)

## 2015-03-22 LAB — TSH: TSH: 0.827 u[IU]/mL (ref 0.350–4.500)

## 2015-03-22 MED ORDER — KETOROLAC TROMETHAMINE 30 MG/ML IJ SOLN
30.0000 mg | Freq: Once | INTRAMUSCULAR | Status: AC
  Administered 2015-03-22: 30 mg via INTRAVENOUS
  Filled 2015-03-22: qty 1

## 2015-03-22 MED ORDER — DEXTROSE-NACL 5-0.9 % IV SOLN
INTRAVENOUS | Status: DC
  Administered 2015-03-22 – 2015-03-25 (×6): via INTRAVENOUS

## 2015-03-22 MED ORDER — SODIUM CHLORIDE 0.9 % IV SOLN: INTRAVENOUS | Status: DC

## 2015-03-22 MED ORDER — OXYCODONE HCL 5 MG PO TABS
5.0000 mg | ORAL_TABLET | ORAL | Status: DC | PRN
  Administered 2015-03-22: 5 mg via ORAL
  Filled 2015-03-22: qty 1

## 2015-03-22 MED ORDER — POTASSIUM CHLORIDE 20 MEQ/15ML (10%) PO SOLN
40.0000 meq | Freq: Once | ORAL | Status: AC
  Administered 2015-03-22: 40 meq via ORAL
  Filled 2015-03-22: qty 30

## 2015-03-22 MED ORDER — KCL IN DEXTROSE-NACL 20-5-0.9 MEQ/L-%-% IV SOLN
INTRAVENOUS | Status: DC
  Administered 2015-03-22: 15:00:00 via INTRAVENOUS
  Filled 2015-03-22 (×3): qty 1000

## 2015-03-22 MED ORDER — POTASSIUM CHLORIDE CRYS ER 20 MEQ PO TBCR
40.0000 meq | EXTENDED_RELEASE_TABLET | Freq: Once | ORAL | Status: DC
  Filled 2015-03-22: qty 2

## 2015-03-22 MED ORDER — BOOST / RESOURCE BREEZE PO LIQD
1.0000 | Freq: Three times a day (TID) | ORAL | Status: DC
  Administered 2015-03-22 – 2015-03-27 (×11): 1 via ORAL

## 2015-03-22 MED ORDER — POTASSIUM CHLORIDE 10 MEQ/100ML IV SOLN: 10.0000 meq | INTRAVENOUS | Status: DC

## 2015-03-22 MED ORDER — POTASSIUM CHLORIDE 10 MEQ/100ML IV SOLN
10.0000 meq | INTRAVENOUS | Status: DC
  Administered 2015-03-22: 10 meq via INTRAVENOUS
  Filled 2015-03-22: qty 100

## 2015-03-22 MED ORDER — POTASSIUM CHLORIDE 20 MEQ/15ML (10%) PO SOLN
10.0000 meq | ORAL | Status: DC
  Filled 2015-03-22: qty 15

## 2015-03-22 NOTE — Consult Note (Signed)
[name] Reason for consult: diarrhea rectal bleeding and abnormal CT Referring Physician: family practice service  Monica Green is an 29 y.o. female.  HPI: patient has had abdominal pain nausea and vomiting and diarrhea for several weeks. I saw her in the office several days ago. She had been found to be profoundly anemic with a hemoglobin a 3.7 and at that time was having no active bleeding. She was sent home on Flagyl and iron tablets. She had been on ibuprofen. After a transfusion her hemoglobin and come back up to 8.2. When I saw her in the office she had not been complaining of diarrhea but was having some rectal bleeding and was having exquisite anal pain to the point that I was unable to do a rectal exam. We treated this with lidocaine cream and suggested MiraLAX. She was due to return in the next several weeks. Feeling was that she likely had anal fissure or anal Crohn's disease and would need a colonoscopy after her anal disease had cleared up. She has subsequently been readmitted. She now is having diarrhea with some blood and CT scan shows pan colitis. She notes that her anal pain is doing better. She has continued with the lidocaine cream and oxycodone for anal pain. The CT scan did suggest diffuse thickening. She has developed diarrhea and is having 4 to 5 loose stools daily with some blood. Her hemoglobin is still low but is up to 10.0.  Past Medical History  Diagnosis Date  . Anemia     History reviewed. No pertinent past surgical history.  History reviewed. No pertinent family history.  Social History:  reports that she has never smoked. She does not have any smokeless tobacco history on file. She reports that she drinks alcohol. She reports that she does not use illicit drugs.  Allergies: No Known Allergies  Medications; Prior to Admission medications   Medication Sig Start Date End Date Taking? Authorizing Provider  diclofenac sodium (VOLTAREN)  1 % GEL Apply 2 g topically 4 (four) times daily.   Yes Historical Provider, MD  ferrous sulfate (FEOSOL) 325 (65 FE) MG tablet Take 1 tablet (325 mg total) by mouth 2 (two) times daily with a meal. 01/06/15  Yes Bonnielee Haff, MD  oxyCODONE (OXY IR/ROXICODONE) 5 MG immediate release tablet Take 1 tablet (5 mg total) by mouth every 4 (four) hours as needed for severe pain. 01/06/15  Yes Bonnielee Haff, MD  polyethylene glycol (MIRALAX / GLYCOLAX) packet Take 17 g by mouth daily as needed for moderate constipation. 01/06/15  Yes Bonnielee Haff, MD  traMADol (ULTRAM) 50 MG tablet Take 50 mg by mouth every 6 (six) hours as needed.   Yes Historical Provider, MD  metroNIDAZOLE (FLAGYL) 500 MG tablet Take 1 tablet (500 mg total) by mouth every 8 (eight) hours. For 6 more days Patient not taking: Reported on 03/21/2015 01/06/15   Bonnielee Haff, MD  ondansetron (ZOFRAN ODT) 4 MG disintegrating tablet Take 1 tablet (4 mg total) by mouth every 8 (eight) hours as needed for nausea or vomiting. 01/06/15   Bonnielee Haff, MD  tamsulosin (FLOMAX) 0.4 MG CAPS capsule Take 1 capsule (0.4 mg total) by mouth daily. Patient not taking: Reported on 03/21/2015 01/06/15   Bonnielee Haff, MD   . feeding supplement  1 Container Oral TID BM  . ferrous sulfate  325 mg Oral BID WC  . heparin  5,000 Units Subcutaneous 3 times per day  . potassium chloride  10 mEq  Intravenous Q1 Hr x 6   PRN Meds acetaminophen, iohexol Results for orders placed or performed during the hospital encounter of 03/21/15 (from the past 48 hour(s))  Urinalysis, Routine w reflex microscopic (not at [facility])     Status: Abnormal   Collection Time: 03/21/15 11:23 AM  Result Value Ref Range   Color, Urine YELLOW YELLOW   APPearance CLOUDY (A) CLEAR   Specific Gravity, Urine 1.007 1.005 - 1.030   pH 7.0 5.0 - 8.0   Glucose, UA NEGATIVE NEGATIVE mg/dL   Hgb urine dipstick SMALL (A) NEGATIVE   Bilirubin Urine NEGATIVE NEGATIVE   Ketones, ur 40 (A)  NEGATIVE mg/dL   Protein, ur NEGATIVE NEGATIVE mg/dL   Nitrite NEGATIVE NEGATIVE   Leukocytes, UA SMALL (A) NEGATIVE  Urine microscopic-add on     Status: Abnormal   Collection Time: 03/21/15 11:23 AM  Result Value Ref Range   Squamous Epithelial / LPF 0-5 (A) NONE SEEN   WBC, UA 0-5 0 - 5 WBC/hpf   RBC / HPF NONE SEEN 0 - 5 RBC/hpf   Bacteria, UA NONE SEEN NONE SEEN  Basic metabolic panel     Status: Abnormal   Collection Time: 03/21/15 11:50 AM  Result Value Ref Range   Sodium 134 (L) 135 - 145 mmol/L   Potassium 2.9 (L) 3.5 - 5.1 mmol/L   Chloride 93 (L) 101 - 111 mmol/L   CO2 27 22 - 32 mmol/L   Glucose, Bld 116 (H) 65 - 99 mg/dL   BUN <5 (L) 6 - 20 mg/dL   Creatinine, Ser 0.72 0.44 - 1.00 mg/dL   Calcium 8.5 (L) 8.9 - 10.3 mg/dL   GFR calc non Af Amer >60 >60 mL/min   GFR calc Af Amer >60 >60 mL/min    Comment: (NOTE) The eGFR has been calculated using the CKD EPI equation. This calculation has not been validated in all clinical situations. eGFR's persistently <60 mL/min signify possible Chronic Kidney Disease.    Anion gap 14 5 - 15  CBC     Status: Abnormal   Collection Time: 03/21/15 11:50 AM  Result Value Ref Range   WBC 9.1 4.0 - 10.5 K/uL   RBC 4.49 3.87 - 5.11 MIL/uL   Hemoglobin 12.5 12.0 - 15.0 g/dL   HCT 39.1 36.0 - 46.0 %   MCV 87.1 78.0 - 100.0 fL   MCH 27.8 26.0 - 34.0 pg   MCHC 32.0 30.0 - 36.0 g/dL   RDW 13.6 11.5 - 15.5 %   Platelets 658 (H) 150 - 400 K/uL  Sedimentation rate     Status: Abnormal   Collection Time: 03/21/15 11:50 AM  Result Value Ref Range   Sed Rate 49 (H) 0 - 22 mm/hr  Magnesium     Status: None   Collection Time: 03/21/15 11:50 AM  Result Value Ref Range   Magnesium 1.9 1.7 - 2.4 mg/dL  I-Stat beta hCG blood, ED ([facility], [facility], [facility] only)     Status: None   Collection Time: 03/21/15 12:07 PM  Result Value Ref Range   I-stat hCG, quantitative <5.0 <5 mIU/mL   Comment 3            Comment:   GEST. AGE      CONC.  (mIU/mL)   <=1  WEEK        5 - 50     2 WEEKS       50 - 500     3 WEEKS  100 - 10,000     4 WEEKS     1,000 - 30,000        FEMALE AND NON-PREGNANT FEMALE:     LESS THAN 5 mIU/mL   POC occult blood, ED RN will collect     Status: Abnormal   Collection Time: 03/21/15  1:41 PM  Result Value Ref Range   Fecal Occult Bld POSITIVE (A) NEGATIVE  Lactic acid, plasma     Status: None   Collection Time: 03/21/15  7:11 PM  Result Value Ref Range   Lactic Acid, Venous 1.7 0.5 - 2.0 mmol/L  Lipase, blood     Status: None   Collection Time: 03/21/15  7:11 PM  Result Value Ref Range   Lipase 21 11 - 51 U/L  C-reactive protein     Status: Abnormal   Collection Time: 03/21/15 10:59 PM  Result Value Ref Range   CRP 21.4 (H) <1.0 mg/dL  Comprehensive metabolic panel     Status: Abnormal   Collection Time: 03/22/15  4:15 AM  Result Value Ref Range   Sodium 137 135 - 145 mmol/L   Potassium 3.0 (L) 3.5 - 5.1 mmol/L   Chloride 107 101 - 111 mmol/L   CO2 24 22 - 32 mmol/L   Glucose, Bld 116 (H) 65 - 99 mg/dL   BUN <5 (L) 6 - 20 mg/dL   Creatinine, Ser 0.54 0.44 - 1.00 mg/dL   Calcium 7.5 (L) 8.9 - 10.3 mg/dL   Total Protein 5.1 (L) 6.5 - 8.1 g/dL   Albumin 1.6 (L) 3.5 - 5.0 g/dL   AST 11 (L) 15 - 41 U/L   ALT 7 (L) 14 - 54 U/L   Alkaline Phosphatase 43 38 - 126 U/L   Total Bilirubin 0.5 0.3 - 1.2 mg/dL   GFR calc non Af Amer >60 >60 mL/min   GFR calc Af Amer >60 >60 mL/min    Comment: (NOTE) The eGFR has been calculated using the CKD EPI equation. This calculation has not been validated in all clinical situations. eGFR's persistently <60 mL/min signify possible Chronic Kidney Disease.    Anion gap 6 5 - 15  CBC     Status: Abnormal   Collection Time: 03/22/15  4:15 AM  Result Value Ref Range   WBC 7.8 4.0 - 10.5 K/uL   RBC 3.51 (L) 3.87 - 5.11 MIL/uL   Hemoglobin 10.0 (L) 12.0 - 15.0 g/dL   HCT 30.8 (L) 36.0 - 46.0 %   MCV 87.7 78.0 - 100.0 fL   MCH 28.5 26.0 - 34.0 pg   MCHC 32.5 30.0 -  36.0 g/dL   RDW 13.8 11.5 - 15.5 %   Platelets 340 150 - 400 K/uL  TSH     Status: None   Collection Time: 03/22/15  4:38 AM  Result Value Ref Range   TSH 0.827 0.350 - 4.500 uIU/mL    Ct Abdomen Pelvis W Contrast  03/21/2015  CLINICAL DATA:  29 year old with abdominal pain and bloody stools for 1 week. History of anemia. EXAM: CT ABDOMEN AND PELVIS WITH CONTRAST TECHNIQUE: Multidetector CT imaging of the abdomen and pelvis was performed using the standard protocol following bolus administration of intravenous contrast. CONTRAST:  83m OMNIPAQUE IOHEXOL 300 MG/ML  SOLN COMPARISON:  Prior study 01/05/2015. FINDINGS: Lower chest: Clear lung bases. No significant pleural or pericardial effusion. Hepatobiliary: There is probable focal fat adjacent to the falciform ligament, measuring 2.2 cm on image 21, not well seen on  prior noncontrast study. No suspicious hepatic findings. No evidence of gallstones, gallbladder wall thickening or biliary dilatation. Pancreas: Unremarkable. No pancreatic ductal dilatation or surrounding inflammatory changes. Spleen: Normal in size without focal abnormality. Adrenals/Urinary Tract: Both adrenal glands appear normal. The kidneys appear normal without evidence of urinary tract calculus, suspicious lesion or hydronephrosis. The bladder is moderately distended. No focal bladder abnormalities are seen. Stomach/Bowel: The stomach, small bowel and appendix appear normal. The appendix is best visualized on coronal image number 40. There is moderate circumferential colonic wall thickening extending from the cecum to the rectum. There is mild pericolonic inflammatory change. No evidence of bowel obstruction or perforation. Vascular/Lymphatic: There are scattered prominent lymph nodes within the mesentery and perirectal space, similar to the prior examination. No significant vascular findings are present. Reproductive: Unremarkable. Other: There is no ascites or extraluminal fluid  collection. Small umbilical hernia containing only fat noted. Musculoskeletal: No acute or significant osseous findings. No evidence of sacroiliitis. IMPRESSION: 1. Pancolitis. Given the findings on the prior study and associated prominent lymph nodes in the mesentery and perirectal space, ulcerative colitis is favored. Pseudomembranous colitis considered less likely. 2. No evidence of bowel obstruction, perforation, abscess or appendicitis. 3. Probable focal fat in the left hepatic lobe. Electronically Signed   By: Richardean Sale M.D.   On: 03/21/2015 18:45              Blood pressure 110/78, pulse 98, temperature 98.2 F (36.8 C), temperature source Oral, resp. rate 17, height _0  (1.676 m), weight 53.706 kg (118 lb 6.4 oz), last menstrual period 02/28/2015, SpO2 100 %.  Physical exam:   GePleasant young African-American female in no acute distress ENT-- nonicteric Neck-- Heart--regular rate and rhythm without murmurs or gallops Lungs--clear  Abdomen-- mild nonlocalizing tenderness     Assessment: 1. Diarrhea/rectal bleeding/colitis on CT. This is most suggestive of IBD. She definitely needs tissue diagnosis.  2. Severe anal pain. This could've been anal fissure or perianal Crohn's. It clinically has been much better with lidocaine and diltiazem cream. We were unable to perform a rectal exam a few days ago but she says that it is improved.   Plan: we will go ahead and plan on unprepped colonoscopy tomorrow at 8 o'clock. Have discussed this with the patient again with her family present. Will plan on obtaining biopsies to rule out IBD.    Alyssamae Klinck JR,Carlyn Mullenbach L 03/22/2015, 3:28 PM   Pager: [phone] If no answer or after hours call [phone]

## 2015-03-22 NOTE — Progress Notes (Signed)
Utilization Review Completed.Donne Anon T12/19/2016

## 2015-03-22 NOTE — Discharge Summary (Signed)
[facility] Discharge Summary  Patient name: Monica Green Medical record number: [id number] Date of birth: May 25, 1985 Age: 29 y.o. Gender: female Date of Admission: 03/21/2015  Date of Discharge: 03/27/2015 Admitting Physician: Kinnie Feil, MD  Primary Care Provider: No primary care provider on file. Consultants: GI   Indication for Hospitalization: Hematochezia   Discharge Diagnoses/Problem List:  Patient Active Problem List   Diagnosis Date Noted  . Protein-calorie malnutrition, severe 03/22/2015  . AP (abdominal pain) 03/21/2015  . Rectal bleed 03/21/2015  . Bloody diarrhea   . Generalized abdominal pain   . Hypokalemia   . Iron deficiency anemia 01/06/2015  . Ureterolithiasis 01/06/2015  . Bacterial vaginosis 01/06/2015  . Microcytic hypochromic anemia 01/05/2015  . Symptomatic anemia 01/05/2015    Disposition: Home   Discharge Condition: Improved   Discharge Exam:  General: young woman lying in bed in NAD, pleasant  Cardiovascular: RRR, no m/r/g Respiratory: CTAB. No wheezes or crackles.  Abdomen: +BS, mildly diffusely tender, soft, no rebound or guarding  Ext: edema appreciated in [place] Hospital Course:  Monica Green is 29 y.o. female who presented with abdominal pain, N/V and diarrhea for several weeks. Additionally, reported worsening of rectal bleeding. Was seen by outpatient GI one week prior to admission. Due to her anal pain, they were unable to do a rectal exam. Prescribed lidocaine cream and Oxycodone for pain and recommended Miralax. Were planning on doing outpatient colonoscopy after improvement of anal pain.   CT abdomen performed and positive for pancolitis. GI consulted and they performed inpatient colonoscopy. Colonoscopy revealed severe pancolitis and pathology results were consistent with ulcerative colitis. Patient was initially put on clear liquid diet, given IV pain control, and started on  IV Solu-Medrol. She was then transitioned to soft diet and PO prednisone. Pain was controlled with PO pain medications. Patient had improvement in symptoms. Hemoglobin remained stable.      Issues for Follow Up:  1. GI follow-up; prednisone taper 2. Repeat CBC to ensure stable hemoglobin  Significant Procedures: Colonoscopy   Significant Labs and Imaging:   Recent Labs Lab 03/21/15 1150 03/22/15 0415  WBC 9.1 7.8  HGB 12.5 10.0*  HCT 39.1 30.8*  PLT 658* 340    Recent Labs Lab 03/21/15 1150 03/22/15 0415  NA 134* 137  K 2.9* 3.0*  CL 93* 107  CO2 27 24  GLUCOSE 116* 116*  BUN <5* <5*  CREATININE 0.72 0.54  CALCIUM 8.5* 7.5*  MG 1.9  --   ALKPHOS  --  43  AST  --  11*  ALT  --  7*  ALBUMIN  --  1.6*    GI Panel Negative x2  C diff Negative  Lipase 21 Beta hCG Negative  CRP 21.4 ESR 49    Ct Abdomen Pelvis W Contrast  03/21/2015  CLINICAL DATA:  29 year old with abdominal pain and bloody stools for 1 week. History of anemia. EXAM: CT ABDOMEN AND PELVIS WITH CONTRAST TECHNIQUE: Multidetector CT imaging of the abdomen and pelvis was performed using the standard protocol following bolus administration of intravenous contrast. CONTRAST:  55m OMNIPAQUE IOHEXOL 300 MG/ML  SOLN COMPARISON:  Prior study 01/05/2015. FINDINGS: Lower chest: Clear lung bases. No significant pleural or pericardial effusion. Hepatobiliary: There is probable focal fat adjacent to the falciform ligament, measuring 2.2 cm on image 21, not well seen on prior noncontrast study. No suspicious hepatic findings. No evidence of gallstones, gallbladder wall thickening or biliary dilatation. Pancreas: Unremarkable.  No pancreatic ductal dilatation or surrounding inflammatory changes. Spleen: Normal in size without focal abnormality. Adrenals/Urinary Tract: Both adrenal glands appear normal. The kidneys appear normal without evidence of urinary tract calculus, suspicious lesion or hydronephrosis. The bladder is  moderately distended. No focal bladder abnormalities are seen. Stomach/Bowel: The stomach, small bowel and appendix appear normal. The appendix is best visualized on coronal image number 40. There is moderate circumferential colonic wall thickening extending from the cecum to the rectum. There is mild pericolonic inflammatory change. No evidence of bowel obstruction or perforation. Vascular/Lymphatic: There are scattered prominent lymph nodes within the mesentery and perirectal space, similar to the prior examination. No significant vascular findings are present. Reproductive: Unremarkable. Other: There is no ascites or extraluminal fluid collection. Small umbilical hernia containing only fat noted. Musculoskeletal: No acute or significant osseous findings. No evidence of sacroiliitis. IMPRESSION: 1. Pancolitis. Given the findings on the prior study and associated prominent lymph nodes in the mesentery and perirectal space, ulcerative colitis is favored. Pseudomembranous colitis considered less likely. 2. No evidence of bowel obstruction, perforation, abscess or appendicitis. 3. Probable focal fat in the left hepatic lobe. Electronically Signed   By: Richardean Sale M.D.   On: 03/21/2015 18:45    Results/Tests Pending at Time of Discharge: None   Discharge Medications:    Medication List    STOP taking these medications        metroNIDAZOLE 500 MG tablet  Commonly known as:  FLAGYL     ondansetron 4 MG disintegrating tablet  Commonly known as:  ZOFRAN ODT     polyethylene glycol packet  Commonly known as:  MIRALAX / GLYCOLAX     tamsulosin 0.4 MG Caps capsule  Commonly known as:  FLOMAX     traMADol 50 MG tablet  Commonly known as:  ULTRAM      TAKE these medications        diclofenac sodium 1 % Gel  Commonly known as:  VOLTAREN  Apply 2 g topically 4 (four) times daily.     feeding supplement Liqd  Take 1 Container by mouth 3 (three) times daily between meals.     ferrous sulfate  325 (65 FE) MG tablet  Commonly known as:  FEOSOL  Take 1 tablet (325 mg total) by mouth 2 (two) times daily with a meal.     oxyCODONE 15 MG immediate release tablet  Commonly known as:  ROXICODONE  Take 0.5 tablets (7.5 mg total) by mouth every 4 (four) hours as needed for severe pain.     predniSONE 10 MG tablet  Commonly known as:  DELTASONE  Take 5 tablets (69m total) twice a day with meals.        Discharge Instructions: Please refer to Patient Instructions section of EMR for full details.  Patient was counseled important signs and symptoms that should prompt return to medical care, changes in medications, dietary instructions, activity restrictions, and follow up appointments.   Follow-Up Appointments:   Follow-up Information    Follow up with EWinfield Cunas MD. Go on 04/19/2015.   Specialty:  Gastroenterology   Why:  _0 :15am for GI follow-up   Contact information:   [phone]      Follow up with JLuiz Blare DO On 04/07/2015.   Specialty:  Family Medicine   Why:  _1 :30pm for hospital fu   Contact information:   [phone]  Nicolette Bang, DO 03/22/2015, 9:46 PM PGY-1, [place], DO 03/27/2015, 8:33 AM PGY-2, [facility]

## 2015-03-22 NOTE — Progress Notes (Signed)
Family Medicine Teaching Service Daily Progress Note Intern Pager: [phone]  Patient name: Monica Green Medical record number: [id number] Date of birth: [birth year] Age: 29 y.o. Gender: female  Primary Care Provider: No primary care provider on file. Consultants: GI Code Status: FULL   Pt Overview and Major Events to Date:  12/19: Pt admitted for hematochezia with abdominal pain   Assessment and Plan: Monica Green is a 29 y.o. female presenting with generalized weakness x 1 weak and rectal bleeding. PMH is significant for IBS, right ureterolithiasis in 01/2015 and severe iron deficiency anemia.  Hematochezia with abdominal pain: With loose, frequent stools since GI appointment 03/16/15. CT abdomen pelvis showed pancolitis. This and prior CT renal study most consistent with Ulcerative Colitis, given loss of haustra and full colonic involvement, ESR 49 Could also consider infectious etiology like viral or bacterial gastroenteritis or pseudomembranous colitis, but she has no leukocytosis or fever. Lipase normal at 21 ruling out pancreatic cause of abdominal pain. Also less likely mesenteric ischemia given history and exam with no risk factors. She denies being sexually active and beta Hcg is negative with normal WBC ruling out pregnancy or pelvic infection as a cause of her pain. Crohn's is less likely due to continuous distribution, but elevated ESR occurs in both UC and Chron's.  - GI consulted, appreciate recs  - + FOBT - Trend Hgb; 12.5 --> 10 but all cell lines down so may have dilutional component from IVF - consider adding stool bulking agent after GI eval - Tissue transglutaminase Ab <2 (01/05/15) - Tylenol for discomfort; one time dose of Toradol given for pain Cr was stable at 0.54  - F/u GI panel  Tachycardia: HR to 137 in ED, improved with IVFs. Likely secondary to dehydration from GI losses.  - s/p 2L IVF boluses - Lactic acid ordered and WNL at 1.7  -  Continue continuous NaCl 125 mL/hr and bolus as needed  Hypokalemia- K 2.9 on admission with potential T wave blunting on EKG. Hypokalemia likely due to GI losses - repeat K 3.0  -patient receiving K in IVF, reported she has become nauseous with oral Kdur and refused medication this AM  -10 meq K in 100 mL IV x6  -f/u BMET   Anemia: Chronic, stable. Hgb 12.5 with MCV normal at 87.1 (was 67.9 01/06/15).  - Daily CBCs; HgB 10.0 (12/19)  - Anticipate need for colonoscopy with tissue biopsy  Thrombocytosis: Chronic, since 2013 at least. Suspect 2/2 chronic inflammatory process.  - 658 on admission, as high as 833 01/05/15.  -340 on 12/19  Decreased appetite with generalized weakness: For the last 2 weeks but worse last several days. Able to drink liquids but little solid food. - Soft diet, advance as tolerated.   FEN/GI: Heart diet, IVF NS @ 125 cc/hr  Prophylaxis: pt refused heparin; SCDs  Disposition: Pending GI workup for hematochezia   Subjective:  Not very hungry this morning. Says pain is mild but wants to take something stronger than Tylenol.   Objective: Temp:  [98.1 F (36.7 C)-99.3 F (37.4 C)] 98.1 F (36.7 C) (12/19 0450) Pulse Rate:  [112-137] 112 (12/19 0450) Resp:  [14-23] 18 (12/19 0450) BP: (102-127)/(60-115) 107/71 mmHg (12/19 0450) SpO2:  [100 %] 100 % (12/19 0450) Weight:  [118 lb 6.4 oz (53.706 kg)-130 lb (58.968 kg)] 118 lb 6.4 oz (53.706 kg) (12/18 1243) Physical Exam: General: thin, young woman in NAD  Cardiovascular: Tachycardic, regular rhythm.  Respiratory: CTAB. No wheezes or  crackles.  Abdomen: +BS, mildly diffusely tender, mildly distended, no rebound or guarding   Laboratory:  Recent Labs Lab 03/21/15 1150 03/22/15 0415  WBC 9.1 7.8  HGB 12.5 10.0*  HCT 39.1 30.8*  PLT 658* 340    Recent Labs Lab 03/21/15 1150 03/22/15 0415  NA 134* 137  K 2.9* 3.0*  CL 93* 107  CO2 27 24  BUN <5* <5*  CREATININE 0.72 0.54  CALCIUM 8.5* 7.5*   PROT  --  5.1*  BILITOT  --  0.5  ALKPHOS  --  43  ALT  --  7*  AST  --  11*  GLUCOSE 116* 116*     Imaging/Diagnostic Tests: Ct Abdomen Pelvis W Contrast  03/21/2015  CLINICAL DATA:  29 year old with abdominal pain and bloody stools for 1 week. History of anemia. EXAM: CT ABDOMEN AND PELVIS WITH CONTRAST TECHNIQUE: Multidetector CT imaging of the abdomen and pelvis was performed using the standard protocol following bolus administration of intravenous contrast. CONTRAST:  29m OMNIPAQUE IOHEXOL 300 MG/ML  SOLN COMPARISON:  Prior study 01/05/2015. FINDINGS: Lower chest: Clear lung bases. No significant pleural or pericardial effusion. Hepatobiliary: There is probable focal fat adjacent to the falciform ligament, measuring 2.2 cm on image 21, not well seen on prior noncontrast study. No suspicious hepatic findings. No evidence of gallstones, gallbladder wall thickening or biliary dilatation. Pancreas: Unremarkable. No pancreatic ductal dilatation or surrounding inflammatory changes. Spleen: Normal in size without focal abnormality. Adrenals/Urinary Tract: Both adrenal glands appear normal. The kidneys appear normal without evidence of urinary tract calculus, suspicious lesion or hydronephrosis. The bladder is moderately distended. No focal bladder abnormalities are seen. Stomach/Bowel: The stomach, small bowel and appendix appear normal. The appendix is best visualized on coronal image number 40. There is moderate circumferential colonic wall thickening extending from the cecum to the rectum. There is mild pericolonic inflammatory change. No evidence of bowel obstruction or perforation. Vascular/Lymphatic: There are scattered prominent lymph nodes within the mesentery and perirectal space, similar to the prior examination. No significant vascular findings are present. Reproductive: Unremarkable. Other: There is no ascites or extraluminal fluid collection. Small umbilical hernia containing only fat  noted. Musculoskeletal: No acute or significant osseous findings. No evidence of sacroiliitis. IMPRESSION: 1. Pancolitis. Given the findings on the prior study and associated prominent lymph nodes in the mesentery and perirectal space, ulcerative colitis is favored. Pseudomembranous colitis considered less likely. 2. No evidence of bowel obstruction, perforation, abscess or appendicitis. 3. Probable focal fat in the left hepatic lobe. Electronically Signed   By: WRichardean SaleM.D.   On: 03/21/2015 18:45    CNicolette Bang DO 03/22/2015, 7:01 AM PGY-1, CSt. RosaIntern pager: [phone] text pages welcome

## 2015-03-22 NOTE — Progress Notes (Signed)
Attempted to administer oral potassium to patient.  However, she stated she tried once before and was immediately nauseous.  She stated that she will try the medication at a later time.  Will pass on information to day shift RN.

## 2015-03-22 NOTE — Progress Notes (Signed)
Initial Nutrition Assessment  DOCUMENTATION CODES:   Severe malnutrition in context of acute illness/injury  INTERVENTION:  Discontinue Ensure.   Provide Boost Breeze po TID, each supplement provides 250 kcal and 9 grams of protein.  Encourage adequate PO intake.   NUTRITION DIAGNOSIS:   Malnutrition related to acute illness as evidenced by percent weight loss, energy intake < or equal to 50% for > or equal to 5 days.  GOAL:   Patient will meet greater than or equal to 90% of their needs  MONITOR:   PO intake, Supplement acceptance, Weight trends, Labs, I & O's  REASON FOR ASSESSMENT:   Malnutrition Screening Tool    ASSESSMENT:   29 y.o. female presenting with generalized weakness x 1 weak and rectal bleeding. PMH is significant for IBS, right ureterolithiasis in 01/2015 and severe iron deficiency anemia.  Pt reports having a lack of appetite which has been ongoing over the past 2-3 weeks. Current meal completion has been 0-90% with 90% at lunch time today. Pt reports having poor po intake PTA with only bites of food at meal time with consumption of Gatorades, Pedialyte, and soups. Pt reports having weight loss with usual body weight of 135 lbs which she reports weighing ~1 month ago. Pt with a 12.5% weight loss. Pt currently has Ensure ordered and has been refusing them reporting it has been causing her abdominal pain. Pt is agreeable to Boost Breeze instead. RD to modify orders. Pt was educated on ways to increase protein and caloric intake at meals. Pt expressed understanding.   Pt with no observed significant fat or muscle mass loss.   Labs and medications reviewed.   Diet Order:  Diet Heart Room service appropriate?: Yes; Fluid consistency:: Thin  Skin:  Reviewed, no issues  Last BM:  12/19  Height:   Ht Readings from Last 1 Encounters:  03/21/15 5\' 6"  (1.676 m)    Weight:   Wt Readings from Last 1 Encounters:  03/21/15 118 lb 6.4 oz (53.706 kg)     Ideal Body Weight:  59 kg  BMI:  Body mass index is 19.12 kg/(m^2).  Estimated Nutritional Needs:   Kcal:  [id number]   Protein:  70-85 grams  Fluid:  1.7 - 1.9 L/day  EDUCATION NEEDS:   Education needs addressed  Corrin Parker, MS, RD, LDN Pager # [phone] After hours/ weekend pager # [phone]

## 2015-03-23 ENCOUNTER — Encounter (HOSPITAL_COMMUNITY)
Admission: EM | Disposition: A | Discharge status: Home / Self Care | Payer: Self-pay | Source: Home / Self Care | Attending: Family Medicine

## 2015-03-23 ENCOUNTER — Encounter (HOSPITAL_COMMUNITY): Payer: Self-pay

## 2015-03-23 HISTORY — PX: FLEXIBLE SIGMOIDOSCOPY: SHX5431

## 2015-03-23 LAB — BASIC METABOLIC PANEL
Anion gap: 9 (ref 5–15)
Anion gap: 8 (ref 5–15)
CALCIUM: 8.2 mg/dL — AB (ref 8.9–10.3)
CHLORIDE: 107 mmol/L (ref 101–111)
CO2: 24 mmol/L (ref 22–32)
CO2: 25 mmol/L (ref 22–32)
CREATININE: 0.58 mg/dL (ref 0.44–1.00)
CREATININE: 0.52 mg/dL (ref 0.44–1.00)
Calcium: 8.1 mg/dL — ABNORMAL LOW (ref 8.9–10.3)
Chloride: 107 mmol/L (ref 101–111)
GFR calc Af Amer: >60 mL/min (ref >60)
GFR calc Af Amer: >60 mL/min (ref >60)
GFR calc non Af Amer: >60 mL/min (ref >60)
GLUCOSE: 94 mg/dL (ref 65–99)
Glucose, Bld: 93 mg/dL (ref 65–99)
POTASSIUM: 3.2 mmol/L — AB (ref 3.5–5.1)
Potassium: 3.8 mmol/L (ref 3.5–5.1)
SODIUM: 140 mmol/L (ref 135–145)
SODIUM: 140 mmol/L (ref 135–145)

## 2015-03-23 LAB — GASTROINTESTINAL PANEL BY PCR, STOOL (REPLACES STOOL CULTURE)
ASTROVIRUS: NOT DETECTED
Adenovirus F40/41: NOT DETECTED
Campylobacter species: NOT DETECTED
Cryptosporidium: NOT DETECTED
Cyclospora cayetanensis: NOT DETECTED
E. COLI O157: NOT DETECTED
ENTAMOEBA HISTOLYTICA: NOT DETECTED
ENTEROAGGREGATIVE E COLI (EAEC): NOT DETECTED
ENTEROTOXIGENIC E COLI (ETEC): NOT DETECTED
Enteropathogenic E coli (EPEC): NOT DETECTED
Giardia lamblia: NOT DETECTED
NOROVIRUS GI/GII: NOT DETECTED
Plesimonas shigelloides: NOT DETECTED
Rotavirus A: NOT DETECTED
SAPOVIRUS (I, II, IV, AND V): NOT DETECTED
SHIGA LIKE TOXIN PRODUCING E COLI (STEC): NOT DETECTED
Salmonella species: NOT DETECTED
Shigella/Enteroinvasive E coli (EIEC): NOT DETECTED
VIBRIO CHOLERAE: NOT DETECTED
Vibrio species: NOT DETECTED
Yersinia enterocolitica: NOT DETECTED

## 2015-03-23 LAB — C DIFFICILE QUICK SCREEN W PCR REFLEX
C DIFFICILE (CDIFF) INTERP: NEGATIVE
C Diff antigen: NEGATIVE
C Diff toxin: NEGATIVE

## 2015-03-23 LAB — CBC
HEMATOCRIT: 33.4 % — AB (ref 36.0–46.0)
Hemoglobin: 10.4 g/dL — ABNORMAL LOW (ref 12.0–15.0)
MCH: 28.2 pg (ref 26.0–34.0)
MCHC: 31.1 g/dL (ref 30.0–36.0)
MCV: 90.5 fL (ref 78.0–100.0)
PLATELETS: 473 10*3/uL — AB (ref 150–400)
RBC: 3.69 MIL/uL — ABNORMAL LOW (ref 3.87–5.11)
RDW: 14 % (ref 11.5–15.5)
WBC: 9.4 10*3/uL (ref 4.0–10.5)

## 2015-03-23 SURGERY — COLONOSCOPY
Anesthesia: Moderate Sedation

## 2015-03-23 SURGERY — SIGMOIDOSCOPY, FLEXIBLE
Anesthesia: Moderate Sedation

## 2015-03-23 MED ORDER — HYDROMORPHONE HCL 1 MG/ML PO LIQD: 1.0000 mg | Freq: Once | ORAL | Status: DC

## 2015-03-23 MED ORDER — METHYLPREDNISOLONE SODIUM SUCC 125 MG IJ SOLR
80.0000 mg | Freq: Two times a day (BID) | INTRAMUSCULAR | Status: DC
  Administered 2015-03-23 – 2015-03-25 (×6): 80 mg via INTRAVENOUS
  Filled 2015-03-23 (×6): qty 2

## 2015-03-23 MED ORDER — POTASSIUM CHLORIDE 20 MEQ/15ML (10%) PO SOLN
40.0000 meq | Freq: Once | ORAL | Status: AC
  Administered 2015-03-23: 40 meq via ORAL
  Filled 2015-03-23: qty 30

## 2015-03-23 MED ORDER — HYDROMORPHONE HCL 1 MG/ML IJ SOLN
1.0000 mg | INTRAMUSCULAR | Status: DC | PRN
  Administered 2015-03-23 – 2015-03-24 (×3): 1 mg via INTRAVENOUS
  Filled 2015-03-23 (×4): qty 1

## 2015-03-23 MED ORDER — SODIUM CHLORIDE 0.9 % IV SOLN: INTRAVENOUS | Status: DC

## 2015-03-23 MED ORDER — DIPHENHYDRAMINE HCL 50 MG/ML IJ SOLN
INTRAMUSCULAR | Status: DC | PRN
  Administered 2015-03-23 (×2): 25 mg via INTRAVENOUS

## 2015-03-23 MED ORDER — FENTANYL CITRATE (PF) 100 MCG/2ML IJ SOLN
INTRAMUSCULAR | Status: DC | PRN
  Administered 2015-03-23 (×4): 25 ug via INTRAVENOUS

## 2015-03-23 MED ORDER — FENTANYL CITRATE (PF) 100 MCG/2ML IJ SOLN
INTRAMUSCULAR | Status: AC
  Filled 2015-03-23: qty 2

## 2015-03-23 MED ORDER — HYDROMORPHONE HCL 2 MG PO TABS
2.0000 mg | ORAL_TABLET | Freq: Once | ORAL | Status: DC
  Filled 2015-03-23: qty 1

## 2015-03-23 MED ORDER — SODIUM CHLORIDE 0.9 % IV SOLN
INTRAVENOUS | Status: DC
  Administered 2015-03-23: 500 mL via INTRAVENOUS

## 2015-03-23 MED ORDER — DIPHENHYDRAMINE HCL 50 MG/ML IJ SOLN
INTRAMUSCULAR | Status: AC
  Filled 2015-03-23: qty 1

## 2015-03-23 MED ORDER — MIDAZOLAM HCL 5 MG/ML IJ SOLN
INTRAMUSCULAR | Status: AC
  Filled 2015-03-23: qty 2

## 2015-03-23 MED ORDER — HYDROMORPHONE HCL 2 MG PO TABS: 1.0000 mg | ORAL_TABLET | Freq: Once | ORAL | Status: DC

## 2015-03-23 MED ORDER — MIDAZOLAM HCL 5 MG/5ML IJ SOLN
INTRAMUSCULAR | Status: DC | PRN
  Administered 2015-03-23: 2 mg via INTRAVENOUS
  Administered 2015-03-23: 1 mg via INTRAVENOUS
  Administered 2015-03-23 (×2): 2 mg via INTRAVENOUS

## 2015-03-23 NOTE — H&P (View-Only) (Signed)
[name] Reason for consult: diarrhea rectal bleeding and abnormal CT Referring Physician: family practice service  Monica Green is an 29 y.o. female.  HPI: patient has had abdominal pain nausea and vomiting and diarrhea for several weeks. I saw her in the office several days ago. She had been found to be profoundly anemic with a hemoglobin a 3.7 and at that time was having no active bleeding. She was sent home on Flagyl and iron tablets. She had been on ibuprofen. After a transfusion her hemoglobin and come back up to 8.2. When I saw her in the office she had not been complaining of diarrhea but was having some rectal bleeding and was having exquisite anal pain to the point that I was unable to do a rectal exam. We treated this with lidocaine cream and suggested MiraLAX. She was due to return in the next several weeks. Feeling was that she likely had anal fissure or anal Crohn's disease and would need a colonoscopy after her anal disease had cleared up. She has subsequently been readmitted. She now is having diarrhea with some blood and CT scan shows pan colitis. She notes that her anal pain is doing better. She has continued with the lidocaine cream and oxycodone for anal pain. The CT scan did suggest diffuse thickening. She has developed diarrhea and is having 4 to 5 loose stools daily with some blood. Her hemoglobin is still low but is up to 10.0.  Past Medical History  Diagnosis Date  . Anemia     History reviewed. No pertinent past surgical history.  History reviewed. No pertinent family history.  Social History:  reports that she has never smoked. She does not have any smokeless tobacco history on file. She reports that she drinks alcohol. She reports that she does not use illicit drugs.  Allergies: No Known Allergies  Medications; Prior to Admission medications   Medication Sig Start Date End Date Taking? Authorizing Provider  diclofenac sodium (VOLTAREN)  1 % GEL Apply 2 g topically 4 (four) times daily.   Yes Historical Provider, MD  ferrous sulfate (FEOSOL) 325 (65 FE) MG tablet Take 1 tablet (325 mg total) by mouth 2 (two) times daily with a meal. 01/06/15  Yes Bonnielee Haff, MD  oxyCODONE (OXY IR/ROXICODONE) 5 MG immediate release tablet Take 1 tablet (5 mg total) by mouth every 4 (four) hours as needed for severe pain. 01/06/15  Yes Bonnielee Haff, MD  polyethylene glycol (MIRALAX / GLYCOLAX) packet Take 17 g by mouth daily as needed for moderate constipation. 01/06/15  Yes Bonnielee Haff, MD  traMADol (ULTRAM) 50 MG tablet Take 50 mg by mouth every 6 (six) hours as needed.   Yes Historical Provider, MD  metroNIDAZOLE (FLAGYL) 500 MG tablet Take 1 tablet (500 mg total) by mouth every 8 (eight) hours. For 6 more days Patient not taking: Reported on 03/21/2015 01/06/15   Bonnielee Haff, MD  ondansetron (ZOFRAN ODT) 4 MG disintegrating tablet Take 1 tablet (4 mg total) by mouth every 8 (eight) hours as needed for nausea or vomiting. 01/06/15   Bonnielee Haff, MD  tamsulosin (FLOMAX) 0.4 MG CAPS capsule Take 1 capsule (0.4 mg total) by mouth daily. Patient not taking: Reported on 03/21/2015 01/06/15   Bonnielee Haff, MD   . feeding supplement  1 Container Oral TID BM  . ferrous sulfate  325 mg Oral BID WC  . heparin  5,000 Units Subcutaneous 3 times per day  . potassium chloride  10 mEq  Intravenous Q1 Hr x 6   PRN Meds acetaminophen, iohexol Results for orders placed or performed during the hospital encounter of 03/21/15 (from the past 48 hour(s))  Urinalysis, Routine w reflex microscopic (not at [facility])     Status: Abnormal   Collection Time: 03/21/15 11:23 AM  Result Value Ref Range   Color, Urine YELLOW YELLOW   APPearance CLOUDY (A) CLEAR   Specific Gravity, Urine 1.007 1.005 - 1.030   pH 7.0 5.0 - 8.0   Glucose, UA NEGATIVE NEGATIVE mg/dL   Hgb urine dipstick SMALL (A) NEGATIVE   Bilirubin Urine NEGATIVE NEGATIVE   Ketones, ur 40 (A)  NEGATIVE mg/dL   Protein, ur NEGATIVE NEGATIVE mg/dL   Nitrite NEGATIVE NEGATIVE   Leukocytes, UA SMALL (A) NEGATIVE  Urine microscopic-add on     Status: Abnormal   Collection Time: 03/21/15 11:23 AM  Result Value Ref Range   Squamous Epithelial / LPF 0-5 (A) NONE SEEN   WBC, UA 0-5 0 - 5 WBC/hpf   RBC / HPF NONE SEEN 0 - 5 RBC/hpf   Bacteria, UA NONE SEEN NONE SEEN  Basic metabolic panel     Status: Abnormal   Collection Time: 03/21/15 11:50 AM  Result Value Ref Range   Sodium 134 (L) 135 - 145 mmol/L   Potassium 2.9 (L) 3.5 - 5.1 mmol/L   Chloride 93 (L) 101 - 111 mmol/L   CO2 27 22 - 32 mmol/L   Glucose, Bld 116 (H) 65 - 99 mg/dL   BUN <5 (L) 6 - 20 mg/dL   Creatinine, Ser 0.72 0.44 - 1.00 mg/dL   Calcium 8.5 (L) 8.9 - 10.3 mg/dL   GFR calc non Af Amer >60 >60 mL/min   GFR calc Af Amer >60 >60 mL/min    Comment: (NOTE) The eGFR has been calculated using the CKD EPI equation. This calculation has not been validated in all clinical situations. eGFR's persistently <60 mL/min signify possible Chronic Kidney Disease.    Anion gap 14 5 - 15  CBC     Status: Abnormal   Collection Time: 03/21/15 11:50 AM  Result Value Ref Range   WBC 9.1 4.0 - 10.5 K/uL   RBC 4.49 3.87 - 5.11 MIL/uL   Hemoglobin 12.5 12.0 - 15.0 g/dL   HCT 39.1 36.0 - 46.0 %   MCV 87.1 78.0 - 100.0 fL   MCH 27.8 26.0 - 34.0 pg   MCHC 32.0 30.0 - 36.0 g/dL   RDW 13.6 11.5 - 15.5 %   Platelets 658 (H) 150 - 400 K/uL  Sedimentation rate     Status: Abnormal   Collection Time: 03/21/15 11:50 AM  Result Value Ref Range   Sed Rate 49 (H) 0 - 22 mm/hr  Magnesium     Status: None   Collection Time: 03/21/15 11:50 AM  Result Value Ref Range   Magnesium 1.9 1.7 - 2.4 mg/dL  I-Stat beta hCG blood, ED ([facility], [facility], [facility] only)     Status: None   Collection Time: 03/21/15 12:07 PM  Result Value Ref Range   I-stat hCG, quantitative <5.0 <5 mIU/mL   Comment 3            Comment:   GEST. AGE      CONC.  (mIU/mL)   <=1  WEEK        5 - 50     2 WEEKS       50 - 500     3 WEEKS  100 - 10,000     4 WEEKS     1,000 - 30,000        FEMALE AND NON-PREGNANT FEMALE:     LESS THAN 5 mIU/mL   POC occult blood, ED RN will collect     Status: Abnormal   Collection Time: 03/21/15  1:41 PM  Result Value Ref Range   Fecal Occult Bld POSITIVE (A) NEGATIVE  Lactic acid, plasma     Status: None   Collection Time: 03/21/15  7:11 PM  Result Value Ref Range   Lactic Acid, Venous 1.7 0.5 - 2.0 mmol/L  Lipase, blood     Status: None   Collection Time: 03/21/15  7:11 PM  Result Value Ref Range   Lipase 21 11 - 51 U/L  C-reactive protein     Status: Abnormal   Collection Time: 03/21/15 10:59 PM  Result Value Ref Range   CRP 21.4 (H) <1.0 mg/dL  Comprehensive metabolic panel     Status: Abnormal   Collection Time: 03/22/15  4:15 AM  Result Value Ref Range   Sodium 137 135 - 145 mmol/L   Potassium 3.0 (L) 3.5 - 5.1 mmol/L   Chloride 107 101 - 111 mmol/L   CO2 24 22 - 32 mmol/L   Glucose, Bld 116 (H) 65 - 99 mg/dL   BUN <5 (L) 6 - 20 mg/dL   Creatinine, Ser 0.54 0.44 - 1.00 mg/dL   Calcium 7.5 (L) 8.9 - 10.3 mg/dL   Total Protein 5.1 (L) 6.5 - 8.1 g/dL   Albumin 1.6 (L) 3.5 - 5.0 g/dL   AST 11 (L) 15 - 41 U/L   ALT 7 (L) 14 - 54 U/L   Alkaline Phosphatase 43 38 - 126 U/L   Total Bilirubin 0.5 0.3 - 1.2 mg/dL   GFR calc non Af Amer >60 >60 mL/min   GFR calc Af Amer >60 >60 mL/min    Comment: (NOTE) The eGFR has been calculated using the CKD EPI equation. This calculation has not been validated in all clinical situations. eGFR's persistently <60 mL/min signify possible Chronic Kidney Disease.    Anion gap 6 5 - 15  CBC     Status: Abnormal   Collection Time: 03/22/15  4:15 AM  Result Value Ref Range   WBC 7.8 4.0 - 10.5 K/uL   RBC 3.51 (L) 3.87 - 5.11 MIL/uL   Hemoglobin 10.0 (L) 12.0 - 15.0 g/dL   HCT 30.8 (L) 36.0 - 46.0 %   MCV 87.7 78.0 - 100.0 fL   MCH 28.5 26.0 - 34.0 pg   MCHC 32.5 30.0 -  36.0 g/dL   RDW 13.8 11.5 - 15.5 %   Platelets 340 150 - 400 K/uL  TSH     Status: None   Collection Time: 03/22/15  4:38 AM  Result Value Ref Range   TSH 0.827 0.350 - 4.500 uIU/mL    Ct Abdomen Pelvis W Contrast  03/21/2015  CLINICAL DATA:  29 year old with abdominal pain and bloody stools for 1 week. History of anemia. EXAM: CT ABDOMEN AND PELVIS WITH CONTRAST TECHNIQUE: Multidetector CT imaging of the abdomen and pelvis was performed using the standard protocol following bolus administration of intravenous contrast. CONTRAST:  83m OMNIPAQUE IOHEXOL 300 MG/ML  SOLN COMPARISON:  Prior study 01/05/2015. FINDINGS: Lower chest: Clear lung bases. No significant pleural or pericardial effusion. Hepatobiliary: There is probable focal fat adjacent to the falciform ligament, measuring 2.2 cm on image 21, not well seen on  prior noncontrast study. No suspicious hepatic findings. No evidence of gallstones, gallbladder wall thickening or biliary dilatation. Pancreas: Unremarkable. No pancreatic ductal dilatation or surrounding inflammatory changes. Spleen: Normal in size without focal abnormality. Adrenals/Urinary Tract: Both adrenal glands appear normal. The kidneys appear normal without evidence of urinary tract calculus, suspicious lesion or hydronephrosis. The bladder is moderately distended. No focal bladder abnormalities are seen. Stomach/Bowel: The stomach, small bowel and appendix appear normal. The appendix is best visualized on coronal image number 40. There is moderate circumferential colonic wall thickening extending from the cecum to the rectum. There is mild pericolonic inflammatory change. No evidence of bowel obstruction or perforation. Vascular/Lymphatic: There are scattered prominent lymph nodes within the mesentery and perirectal space, similar to the prior examination. No significant vascular findings are present. Reproductive: Unremarkable. Other: There is no ascites or extraluminal fluid  collection. Small umbilical hernia containing only fat noted. Musculoskeletal: No acute or significant osseous findings. No evidence of sacroiliitis. IMPRESSION: 1. Pancolitis. Given the findings on the prior study and associated prominent lymph nodes in the mesentery and perirectal space, ulcerative colitis is favored. Pseudomembranous colitis considered less likely. 2. No evidence of bowel obstruction, perforation, abscess or appendicitis. 3. Probable focal fat in the left hepatic lobe. Electronically Signed   By: Richardean Sale M.D.   On: 03/21/2015 18:45              Blood pressure 110/78, pulse 98, temperature 98.2 F (36.8 C), temperature source Oral, resp. rate 17, height _0  (1.676 m), weight 53.706 kg (118 lb 6.4 oz), last menstrual period 02/28/2015, SpO2 100 %.  Physical exam:   GePleasant young African-American female in no acute distress ENT-- nonicteric Neck-- Heart--regular rate and rhythm without murmurs or gallops Lungs--clear  Abdomen-- mild nonlocalizing tenderness     Assessment: 1. Diarrhea/rectal bleeding/colitis on CT. This is most suggestive of IBD. She definitely needs tissue diagnosis.  2. Severe anal pain. This could've been anal fissure or perianal Crohn's. It clinically has been much better with lidocaine and diltiazem cream. We were unable to perform a rectal exam a few days ago but she says that it is improved.   Plan: we will go ahead and plan on unprepped colonoscopy tomorrow at 8 o'clock. Have discussed this with the patient again with her family present. Will plan on obtaining biopsies to rule out IBD.    Brysun Eschmann JR,Philipe Laswell L 03/22/2015, 3:28 PM   Pager: [phone] If no answer or after hours call [phone]

## 2015-03-23 NOTE — Op Note (Signed)
[facility] [place], [id number]   FLEXIBLE SIGMOIDOSCOPY PROCEDURE REPORT  PATIENT: Monica Green, Monica Green  MR#: [id number] BIRTHDATE: [birth year] , 29  yrs. old GENDER: female ENDOSCOPIST: Laurence Spates, MD REFERRED BY: Family Practice Teaching Service PROCEDURE DATE:  03/23/2015 PROCEDURE:   Flexible Sigmoidoscopy with Biopsy ASA CLASS:   class I INDICATIONS: abdominal pain, rectal bleeding, diarrhea, swollen: on CT scan MEDICATIONS: fentanyl 100 g, versed 7 mg, Benadryl 50 mg IV  DESCRIPTION OF PROCEDURE:   After the risks benefits and alternatives of the procedure were thoroughly explained, informed consent was obtained.  marked pain and discomfort with digital exam      The Pentax Ped Colon [id number]  endoscope was introduced through the anus  and advanced to the descending:      , The exam was Without limitations.    The quality of the prep was adequate. The exam was done on prep      . Estimated blood loss is zero unless otherwise noted in this procedure report. The instrument was then slowly withdrawn as the mucosa was fully examined. The colon was severely ulcerated and inflamed from the anus to the proximal descending colon with confluent ulcers and marked bleeding. multiple biopsies were obtained. Stool was obtained to send  for c diff toxin and G.I. pathogen panel. the patient tolerated the procedure well in her abdomen was soft determination of the procedure. We had originally planned for colonoscopy due to the significant severe ulcerations the procedure was terminated in the descending colon.         The scope was then withdrawn from the patient and the procedure terminated.  COMPLICATIONS: There were no immediate complications.  ENDOSCOPIC IMPRESSION: 1. Severe Pancolitis. One G.I. pathogen panel is negative for enteric pathogens and will be repeated. The most likely etiology is Crohn's or ulcerative  colitis.  RECOMMENDATIONS: we will keep the patient on clear liquids and go ahead and start on IV Solu-Medrol. She does not respond to this we will consider Remicade IV. We will continue to treat her pain with pain medications.  REPEAT EXAM:  eSigned:  Laurence Spates, MD 03/23/2015 8:58 AM   [id number] practice teaching service  PATIENT NAME:  Monica Green, Monica Green MR#: [id number]

## 2015-03-23 NOTE — Progress Notes (Signed)
Family Medicine Teaching Service Daily Progress Note Intern Pager: [phone]  Patient name: Monica Green Medical record number: [id number] Date of birth: [birth year] Age: 29 y.o. Gender: female  Primary Care Provider: No primary care provider on file. Consultants: GI Code Status: FULL   Pt Overview and Major Events to Date:  12/19: Pt admitted for hematochezia with abdominal pain  12/20: Colonoscopy with tissue biopsy performed; IV Solumedrol started   Assessment and Plan: Monica Green is a 29 y.o. female presenting with generalized weakness x 1 weak and rectal bleeding. PMH is significant for IBS, right ureterolithiasis in 01/2015 and severe iron deficiency anemia.  Hematochezia with abdominal pain: With loose, frequent stools since GI appointment 03/16/15. CT abdomen pelvis showed pancolitis. This and prior CT renal study most consistent with Ulcerative Colitis, given loss of haustra and full colonic involvement, ESR 49 Could also consider infectious etiology like viral or bacterial gastroenteritis or pseudomembranous colitis, but she has no leukocytosis or fever. Lipase normal at 21 ruling out pancreatic cause of abdominal pain. Also less likely mesenteric ischemia given history and exam with no risk factors. She denies being sexually active and beta Hcg is negative with normal WBC ruling out pregnancy or pelvic infection as a cause of her pain. Crohn's is less likely due to continuous distribution, but elevated ESR occurs in both UC and Chron's. +FOBT. Tissue transglutaminase Ab <2 (01/05/15).  - GI consulted, appreciate recs; colonoscopy performed 12/20 with tissue biopsy  -colonoscopy: severe pancolitis with most likely etiology of UC or Chron's Disease >> keep patient on clear liquids and start IV Solu-Medrol, will consider Remicade IV if not responding  - Trend Hgb; 12.5 >> 10 >> 9.4  - Oxycodone 5 mg given overnight for rectal pain -started on Dilaudid 1 mg q3h for  severe pain  - F/u GI panel: all negative; repeat GI panel pending  -C diff pending   Tachycardia, Improving: HR to 137 in ED, improved with IVFs. Likely secondary to dehydration from GI losses. S/p 2L IVF boluses and MIVF >24 hours. Lactic acid ordered and WNL at 1.7.  - Continue continuous NaCl 125 mL/hr and bolus as needed  Hypokalemia- K 2.9 on admission with potential T wave blunting on EKG. Hypokalemia likely due to GI losses. Repletion with oral K given; K to 3.8 on 12/19. K discontinued in IVF overnight.  -f/u BMET; repeat K 3.2  -40 mEq oral K solution ordered    Anemia: Chronic, stable. Hgb 12.5 with MCV normal at 87.1 (was 67.9 01/06/15).  - Daily CBCs; HgB 19.4 (12/20)  - colonoscopy with tissue biopsy performed 12/20   Thrombocytosis: Chronic, since 2013 at least. Suspect 2/2 chronic inflammatory process.  - 658 on admission, as high as 833 01/05/15.  -340 on 12/19  Decreased appetite with generalized weakness: For the last 2 weeks but worse last several days. Able to drink liquids but little solid food. - Soft diet, advance as tolerated.   FEN/GI: Clear Liquids, IVF NS @ 125 cc/hr  Prophylaxis: pt refused heparin; SCDs  Disposition: Pending GI workup for hematochezia   Subjective:  Rectal pain and pressure overnight. Sleepy after colonoscopy. Pain controlled now.   Objective: Temp:  [98.2 F (36.8 C)-98.7 F (37.1 C)] 98.2 F (36.8 C) (12/20 0501) Pulse Rate:  [93-100] 100 (12/20 0501) Resp:  [16-18] 16 (12/20 0501) BP: (100-120)/(64-81) 120/81 mmHg (12/20 0501) SpO2:  [100 %] 100 % (12/20 0501) Physical Exam: General: thin, young woman sleepy but arousable  Cardiovascular:  Tachycardic, regular rhythm.  Respiratory: CTAB. No wheezes or crackles.  Abdomen: +BS, mildly diffusely tender, mildly distended, no rebound or guarding   Laboratory:  Recent Labs Lab 03/21/15 1150 03/22/15 0415  WBC 9.1 7.8  HGB 12.5 10.0*  HCT 39.1 30.8*  PLT 658* 340     Recent Labs Lab 03/21/15 1150 03/22/15 0415 03/22/15 2106  NA 134* 137 140  K 2.9* 3.0* 3.8  CL 93* 107 107  CO2 '27 24 24  '$ BUN <5* <5* <5*  CREATININE 0.72 0.54 0.58  CALCIUM 8.5* 7.5* 8.2*  PROT  --  5.1*  --   BILITOT  --  0.5  --   ALKPHOS  --  43  --   ALT  --  7*  --   AST  --  11*  --   GLUCOSE 116* 116* 93     Imaging/Diagnostic Tests: Ct Abdomen Pelvis W Contrast  03/21/2015  CLINICAL DATA:  29 year old with abdominal pain and bloody stools for 1 week. History of anemia. EXAM: CT ABDOMEN AND PELVIS WITH CONTRAST TECHNIQUE: Multidetector CT imaging of the abdomen and pelvis was performed using the standard protocol following bolus administration of intravenous contrast. CONTRAST:  58m OMNIPAQUE IOHEXOL 300 MG/ML  SOLN COMPARISON:  Prior study 01/05/2015. FINDINGS: Lower chest: Clear lung bases. No significant pleural or pericardial effusion. Hepatobiliary: There is probable focal fat adjacent to the falciform ligament, measuring 2.2 cm on image 21, not well seen on prior noncontrast study. No suspicious hepatic findings. No evidence of gallstones, gallbladder wall thickening or biliary dilatation. Pancreas: Unremarkable. No pancreatic ductal dilatation or surrounding inflammatory changes. Spleen: Normal in size without focal abnormality. Adrenals/Urinary Tract: Both adrenal glands appear normal. The kidneys appear normal without evidence of urinary tract calculus, suspicious lesion or hydronephrosis. The bladder is moderately distended. No focal bladder abnormalities are seen. Stomach/Bowel: The stomach, small bowel and appendix appear normal. The appendix is best visualized on coronal image number 40. There is moderate circumferential colonic wall thickening extending from the cecum to the rectum. There is mild pericolonic inflammatory change. No evidence of bowel obstruction or perforation. Vascular/Lymphatic: There are scattered prominent lymph nodes within the mesentery and  perirectal space, similar to the prior examination. No significant vascular findings are present. Reproductive: Unremarkable. Other: There is no ascites or extraluminal fluid collection. Small umbilical hernia containing only fat noted. Musculoskeletal: No acute or significant osseous findings. No evidence of sacroiliitis. IMPRESSION: 1. Pancolitis. Given the findings on the prior study and associated prominent lymph nodes in the mesentery and perirectal space, ulcerative colitis is favored. Pseudomembranous colitis considered less likely. 2. No evidence of bowel obstruction, perforation, abscess or appendicitis. 3. Probable focal fat in the left hepatic lobe. Electronically Signed   By: WRichardean SaleM.D.   On: 03/21/2015 18:45    CNicolette Bang DO 03/23/2015, 6:59 AM PGY-1, [place] pager: [phone] text pages welcome

## 2015-03-23 NOTE — Interval H&P Note (Signed)
History and Physical Interval Note:  03/23/2015 8:18 AM  Monica Green  has presented today for surgery, with the diagnosis of GI bleed  The various methods of treatment have been discussed with the patient and family. After consideration of risks, benefits and other options for treatment, the patient has consented to  Procedure(s): COLONOSCOPY (N/A) as a surgical intervention .  The patient's history has been reviewed, patient examined, no change in status, stable for surgery.  I have reviewed the patient's chart and labs.  Questions were answered to the patient's satisfaction.     Deigo Alonso JR,Mahogony Gilchrest L

## 2015-03-24 ENCOUNTER — Encounter (HOSPITAL_COMMUNITY): Payer: Self-pay | Admitting: Gastroenterology

## 2015-03-24 DIAGNOSIS — E43 Unspecified severe protein-calorie malnutrition: Secondary | ICD-10-CM

## 2015-03-24 LAB — BASIC METABOLIC PANEL
Anion gap: 9 (ref 5–15)
BUN: <5 mg/dL — ABNORMAL LOW (ref 6–20)
CALCIUM: 8.1 mg/dL — AB (ref 8.9–10.3)
CO2: 26 mmol/L (ref 22–32)
CREATININE: 0.4 mg/dL — AB (ref 0.44–1.00)
Chloride: 108 mmol/L (ref 101–111)
GFR calc non Af Amer: >60 mL/min (ref >60)
Glucose, Bld: 124 mg/dL — ABNORMAL HIGH (ref 65–99)
Potassium: 3.4 mmol/L — ABNORMAL LOW (ref 3.5–5.1)
SODIUM: 143 mmol/L (ref 135–145)

## 2015-03-24 LAB — CBC
HCT: 31.3 % — ABNORMAL LOW (ref 36.0–46.0)
Hemoglobin: 10.1 g/dL — ABNORMAL LOW (ref 12.0–15.0)
MCH: 29.3 pg (ref 26.0–34.0)
MCHC: 32.3 g/dL (ref 30.0–36.0)
MCV: 90.7 fL (ref 78.0–100.0)
PLATELETS: 485 10*3/uL — AB (ref 150–400)
RBC: 3.45 MIL/uL — AB (ref 3.87–5.11)
RDW: 13.9 % (ref 11.5–15.5)
WBC: 9.2 10*3/uL (ref 4.0–10.5)

## 2015-03-24 MED ORDER — ZOLPIDEM TARTRATE 5 MG PO TABS
5.0000 mg | ORAL_TABLET | Freq: Every evening | ORAL | Status: DC | PRN
Start: 2015-03-24 — End: 2015-03-27
  Administered 2015-03-25 – 2015-03-26 (×2): 5 mg via ORAL
  Filled 2015-03-24 (×2): qty 1

## 2015-03-24 MED ORDER — POTASSIUM CHLORIDE CRYS ER 20 MEQ PO TBCR
40.0000 meq | EXTENDED_RELEASE_TABLET | Freq: Once | ORAL | Status: AC
  Administered 2015-03-24: 40 meq via ORAL
  Filled 2015-03-24: qty 2

## 2015-03-24 MED ORDER — HYDROMORPHONE HCL 1 MG/ML IJ SOLN
1.0000 mg | INTRAMUSCULAR | Status: DC | PRN
  Administered 2015-03-24 – 2015-03-25 (×5): 1 mg via INTRAVENOUS
  Filled 2015-03-24 (×4): qty 1

## 2015-03-24 NOTE — Progress Notes (Signed)
Family Medicine Teaching Service Daily Progress Note Intern Pager: [phone]  Patient name: Monica Green Medical record number: [id number] Date of birth: 1986/02/06 Age: 29 y.o. Gender: female  Primary Care Provider: No primary care provider on file. Consultants: GI Code Status: FULL   Pt Overview and Major Events to Date:  12/19: Pt admitted for hematochezia with abdominal pain  12/20: Colonoscopy with tissue biopsy performed; IV Solumedrol started   Assessment and Plan: Monica Green is a 29 y.o. female presenting with generalized weakness x 1 weak and rectal bleeding. PMH is significant for IBS, right ureterolithiasis in 01/2015 and severe iron deficiency anemia.  Hematochezia with abdominal pain: With loose, frequent stools since GI appointment 03/16/15. CT abdomen pelvis showed pancolitis.+FOBT. Tissue transglutaminase Ab <2 (01/05/15). Colonoscopy performed with tissue biopsy on 12/20. Revealed severe pancolitis with most likely etiology of UC or Chron's disease. - GI consulted, appreciate recs - keep patient on clear liquids  - IV Solu-Medrol, will consider Remicade IV if not responding  - Trend Hgb; 12.5 >> 10 >> 10.4  -Dilaudid 1 mg q3h for severe pain  - F/u GI panel: all negative; repeat GI panel negative  -C diff negative -f/u tissue biopsy   Left UE Edema: Had left PIV removed overnight. L arm edematous to shoulder, TTP and firm in areas. Patient is elevating arm with heating packs.  -UE doppler ordered  -continued elevation   Tachycardia, Improving: HR to 137 in ED, improved with IVFs. Likely secondary to dehydration from GI losses. S/p 2L IVF boluses and MIVF >24 hours. Lactic acid ordered and WNL at 1.7.  - Continue continuous D5NS 125 mL/hr and bolus as needed  Hypokalemia- K 2.9 on admission with potential T wave blunting on EKG. Hypokalemia likely due to GI losses. Repletion with oral K solution because pt refused Kdur (2/2 to making nauseous in  past) and K in IVF was burning.  -f/u BMET; repeat K pending    Anemia: Chronic, stable. Hgb 12.5 with MCV normal at 87.1 (was 67.9 01/06/15).  - Daily CBCs; HgB pending from 12/21 - colonoscopy with tissue biopsy performed 12/20   Thrombocytosis: Chronic, since 2013 at least. Suspect 2/2 chronic inflammatory process.  - 658 on admission, as high as 833 01/05/15.  -CBC pending from 12/21  FEN/GI: Clear Liquids, IVF D5NS @ 125 cc/hr  Prophylaxis: pt refused heparin; SCDs  Disposition: Pending improvement with IV steroids   Subjective:  Feeling better this morning. Complains of swelling and pain in left arm after PIV removed.   Objective: Temp:  [97.9 F (36.6 C)-98.5 F (36.9 C)] 97.9 F (36.6 C) (12/21 0416) Pulse Rate:  [82-124] 82 (12/21 0416) Resp:  [16-29] 17 (12/21 0416) BP: (95-116)/(55-85) 95/70 mmHg (12/21 0416) SpO2:  [100 %] 100 % (12/21 0416) Physical Exam: General: thin, young woman sleepy but arousable  Cardiovascular: Tachycardic, regular rhythm.  Respiratory: CTAB. No wheezes or crackles.  Abdomen: +BS, mildly diffusely tender, mildly distended, no rebound or guarding  Ext: L UE edematous to shoulder, unable to assess warmth to touch 2/2 to heating packs, TTP throughout, skin firm in some places   Laboratory:  Recent Labs Lab 03/21/15 1150 03/22/15 0415 03/23/15 0720  WBC 9.1 7.8 9.4  HGB 12.5 10.0* 10.4*  HCT 39.1 30.8* 33.4*  PLT 658* 340 473*    Recent Labs Lab 03/22/15 0415 03/22/15 2106 03/23/15 0720  NA 137 140 140  K 3.0* 3.8 3.2*  CL 107 107 107  CO2 24 24  25  BUN <5* <5* <5*  CREATININE 0.54 0.58 0.52  CALCIUM 7.5* 8.2* 8.1*  PROT 5.1*  --   --   BILITOT 0.5  --   --   ALKPHOS 43  --   --   ALT 7*  --   --   AST 11*  --   --   GLUCOSE 116* 93 94   GI Panel Negative x2  C diff Negative   Imaging/Diagnostic Tests: Ct Abdomen Pelvis W Contrast  03/21/2015  CLINICAL DATA:  29 year old with abdominal pain and bloody stools  for 1 week. History of anemia. EXAM: CT ABDOMEN AND PELVIS WITH CONTRAST TECHNIQUE: Multidetector CT imaging of the abdomen and pelvis was performed using the standard protocol following bolus administration of intravenous contrast. CONTRAST:  75mL OMNIPAQUE IOHEXOL 300 MG/ML  SOLN COMPARISON:  Prior study 01/05/2015. FINDINGS: Lower chest: Clear lung bases. No significant pleural or pericardial effusion. Hepatobiliary: There is probable focal fat adjacent to the falciform ligament, measuring 2.2 cm on image 21, not well seen on prior noncontrast study. No suspicious hepatic findings. No evidence of gallstones, gallbladder wall thickening or biliary dilatation. Pancreas: Unremarkable. No pancreatic ductal dilatation or surrounding inflammatory changes. Spleen: Normal in size without focal abnormality. Adrenals/Urinary Tract: Both adrenal glands appear normal. The kidneys appear normal without evidence of urinary tract calculus, suspicious lesion or hydronephrosis. The bladder is moderately distended. No focal bladder abnormalities are seen. Stomach/Bowel: The stomach, small bowel and appendix appear normal. The appendix is best visualized on coronal image number 40. There is moderate circumferential colonic wall thickening extending from the cecum to the rectum. There is mild pericolonic inflammatory change. No evidence of bowel obstruction or perforation. Vascular/Lymphatic: There are scattered prominent lymph nodes within the mesentery and perirectal space, similar to the prior examination. No significant vascular findings are present. Reproductive: Unremarkable. Other: There is no ascites or extraluminal fluid collection. Small umbilical hernia containing only fat noted. Musculoskeletal: No acute or significant osseous findings. No evidence of sacroiliitis. IMPRESSION: 1. Pancolitis. Given the findings on the prior study and associated prominent lymph nodes in the mesentery and perirectal space, ulcerative  colitis is favored. Pseudomembranous colitis considered less likely. 2. No evidence of bowel obstruction, perforation, abscess or appendicitis. 3. Probable focal fat in the left hepatic lobe. Electronically Signed   By: Richardean Sale M.D.   On: 03/21/2015 18:45    Nicolette Bang, DO 03/24/2015, 6:47 AM PGY-1, [place] Intern pager: [phone], text pages welcome

## 2015-03-24 NOTE — Progress Notes (Signed)
[facility] PROGRESS NOTE Subjective patient reports much better with diarrhea and almost no rectal bleeding today after starting steroids yesterday.  Objective: Vital signs in last 24 hours: Temp:  [97.8 F (36.6 C)-98.4 F (36.9 C)] 97.8 F (36.6 C) (12/21 1022) Pulse Rate:  [82-101] 89 (12/21 1022) Resp:  [17-20] 18 (12/21 1022) BP: (95-116)/(55-82) 116/77 mmHg (12/21 1022) SpO2:  [100 %] 100 % (12/21 1022) Last BM Date: 03/23/15  Intake/Output from previous day: 12/20 0701 - 12/21 0700 In: 2008.8 [P.O.:840; I.V.:1168.8] Out: 700 [Urine:700] Intake/Output this shift:    PE: General-- alert and oriented no distress - Abdomen-- nondistended and soft with mild tenderness positive bowel sounds  Lab Results:  Recent Labs  03/21/15 1150 03/22/15 0415 03/23/15 0720  WBC 9.1 7.8 9.4  HGB 12.5 10.0* 10.4*  HCT 39.1 30.8* 33.4*  PLT 658* 340 473*   BMET  Recent Labs  03/21/15 1150 03/22/15 0415 03/22/15 2106 03/23/15 0720  NA 134* 137 140 140  K 2.9* 3.0* 3.8 3.2*  CL 93* 107 107 107  CO2 27 24 24 25   CREATININE 0.72 0.54 0.58 0.52   LFT  Recent Labs  03/22/15 0415  PROT 5.1*  AST 11*  ALT 7*  ALKPHOS 43  BILITOT 0.5   PT/INR No results for input(s): LABPROT, INR in the last 72 hours. PANCREAS  Recent Labs  03/21/15 1911  LIPASE 21         Studies/Results: No results found.  Medications: I have reviewed the patient's current medications.  Assessment/Plan: 1. Ulcerative colitis. Probable pancolitis. Dr. Saralyn Pilar called me and discuss path report. Appears to be classic ulcerative colitis. Have discussed this with patient mother. If she is doing well with steroids tomorrow in advance diet and switch over to oral prednisone.   Younique Casad JR,Delois Tolbert L 03/24/2015, 11:07 AM  Pager: [phone] If no answer or after hours call [phone]

## 2015-03-24 NOTE — Progress Notes (Signed)
Patient began complaining of having discomfort and the urge to void when lying flat on her back tonight. When patient went to void, she only had an output of about 57ml, and the patient still experienced discomfort and the urge to void when lying flat on her back. She denied pain or the urge to void while sitting upright and upon palpation of bladder. Patient is unable to tolerate a bladder scan at this time due to discomfort while lying on her back. MD notified and order for UA placed. Will continue to monitor patient closely.   Shelbie Hutching, RN, BSN

## 2015-03-24 NOTE — Progress Notes (Signed)
Patient's IV infiltrated, with arm all swelled up. IV taken out, warm pack applied and arm elevated.

## 2015-03-25 ENCOUNTER — Ambulatory Visit (HOSPITAL_COMMUNITY): Payer: Self-pay

## 2015-03-25 DIAGNOSIS — R609 Edema, unspecified: Secondary | ICD-10-CM

## 2015-03-25 LAB — BASIC METABOLIC PANEL
Anion gap: 7 (ref 5–15)
CHLORIDE: 110 mmol/L (ref 101–111)
CO2: 26 mmol/L (ref 22–32)
Calcium: 8.1 mg/dL — ABNORMAL LOW (ref 8.9–10.3)
Creatinine, Ser: 0.49 mg/dL (ref 0.44–1.00)
GFR calc Af Amer: >60 mL/min (ref >60)
GFR calc non Af Amer: >60 mL/min (ref >60)
Glucose, Bld: 132 mg/dL — ABNORMAL HIGH (ref 65–99)
POTASSIUM: 3.5 mmol/L (ref 3.5–5.1)
SODIUM: 143 mmol/L (ref 135–145)

## 2015-03-25 LAB — URINALYSIS, ROUTINE W REFLEX MICROSCOPIC
Bilirubin Urine: NEGATIVE
GLUCOSE, UA: NEGATIVE mg/dL
KETONES UR: NEGATIVE mg/dL
LEUKOCYTES UA: NEGATIVE
NITRITE: NEGATIVE
PROTEIN: NEGATIVE mg/dL
Specific Gravity, Urine: 1.012 (ref 1.005–1.030)
pH: 6 (ref 5.0–8.0)

## 2015-03-25 LAB — CBC
HCT: 29.7 % — ABNORMAL LOW (ref 36.0–46.0)
Hemoglobin: 9.5 g/dL — ABNORMAL LOW (ref 12.0–15.0)
MCH: 29.3 pg (ref 26.0–34.0)
MCHC: 32 g/dL (ref 30.0–36.0)
MCV: 91.7 fL (ref 78.0–100.0)
PLATELETS: 493 10*3/uL — AB (ref 150–400)
RBC: 3.24 MIL/uL — ABNORMAL LOW (ref 3.87–5.11)
RDW: 14.1 % (ref 11.5–15.5)
WBC: 11 10*3/uL — AB (ref 4.0–10.5)

## 2015-03-25 LAB — URINE MICROSCOPIC-ADD ON

## 2015-03-25 LAB — MAGNESIUM: Magnesium: 1.7 mg/dL (ref 1.7–2.4)

## 2015-03-25 MED ORDER — OXYCODONE HCL 5 MG PO TABS
7.5000 mg | ORAL_TABLET | ORAL | Status: DC | PRN
  Administered 2015-03-25 – 2015-03-27 (×8): 7.5 mg via ORAL
  Filled 2015-03-25 (×10): qty 2

## 2015-03-25 NOTE — Progress Notes (Signed)
Family Medicine Teaching Service Daily Progress Note Intern Pager: [phone]  Patient name: Monica Green Medical record number: [id number] Date of birth: 02/20/1986 Age: 29 y.o. Gender: female  Primary Care Provider: No primary care provider on file. Consultants: GI Code Status: FULL   Pt Overview and Major Events to Date:  12/19: Pt admitted for hematochezia with abdominal pain  12/20: Colonoscopy with tissue biopsy performed; IV Solumedrol started   Assessment and Plan: Monica Green is a 29 y.o. female presenting with generalized weakness x 1 weak and rectal bleeding. PMH is significant for IBS, right ureterolithiasis in 01/2015 and severe iron deficiency anemia.  Hematochezia with abdominal pain: With loose, frequent stools since GI appointment 03/16/15. CT abdomen pelvis showed pancolitis.+FOBT. Tissue transglutaminase Ab <2 (01/05/15). Colonoscopy performed with tissue biopsy on 12/20. Revealed severe pancolitis with most likely etiology of UC or Chron's disease. - GI consulted, appreciate recs>> probably switch to oral prednisone and advancement in diet today  - patient currently on clear liquids  - IV Solu-Medrol - Trend Hgb; 12.5 >> 10 >> 10.4 >>10.1>> 11.0 -Dilaudid 1 mg q3h for severe pain >> switch to Oxycodone 7.5 mg q4h - F/u GI panel: all negative; repeat GI panel negative  -C diff negative -f/u tissue biopsy: appears to be classic ulcerative colitis  -discontinue fluids today due to adequate PO intake   Left UE Edema: Had left PIV removed overnight. L arm edematous to shoulder, TTP and firm in areas. Patient is elevating arm with heating packs.  -UE doppler pending  -continued elevation   Tachycardia, Improving: HR to 137 in ED, improved with IVFs. Likely secondary to dehydration from GI losses and pain. Lactic acid ordered and WNL at 1.7.  -continue to monitor   Hypokalemia- K 2.9 on admission with potential T wave blunting on EKG. Hypokalemia likely  due to GI losses. Repletion with oral K solution because pt refused Kdur (2/2 to making nauseous in past) and K in IVF was burning.  -f/u BMET; K 3.5 today  -check Mg today: 1.7    Anemia: Chronic, stable. Hgb 12.5 with MCV normal at 87.1 (was 67.9 01/06/15).  - Daily CBCs; HgB 11.0 12/22 - colonoscopy with tissue biopsy performed 12/20   Thrombocytosis: Chronic, since 2013 at least. Suspect 2/2 chronic inflammatory process.  - 658 on admission, as high as 833 01/05/15.  -493 on 12/22  FEN/GI: Clear Liquids, IVF D5NS @ 100 cc/hr  Prophylaxis: pt refused heparin; SCDs  Disposition: Pending improvement with IV steroids   Subjective:  Doing well this morning. Was uncomfortable overnight due to having urge to void. Bladder scan performed and normal. Pain improving today. Complains of LE edema.   Objective: Temp:  [97.7 F (36.5 C)-97.9 F (36.6 C)] 97.7 F (36.5 C) (12/22 0807) Pulse Rate:  [89-106] 91 (12/22 0807) Resp:  [18-20] 18 (12/22 0807) BP: (94-121)/(56-88) 94/56 mmHg (12/22 0807) SpO2:  [100 %] 100 % (12/22 0807) Weight:  [131 lb 1.6 oz (59.467 kg)] 131 lb 1.6 oz (59.467 kg) (12/21 2118) Physical Exam: General: thin, young woman lying in bed in NAD  Cardiovascular: Tachycardic, regular rhythm.  Respiratory: CTAB. No wheezes or crackles.  Abdomen: +BS, mildly diffusely tender, mildly distended, no rebound or guarding  Ext: 1+ pitting edema to thighs of LE bilaterally   Laboratory:  Recent Labs Lab 03/22/15 0415 03/23/15 0720 03/24/15 1110  WBC 7.8 9.4 9.2  HGB 10.0* 10.4* 10.1*  HCT 30.8* 33.4* 31.3*  PLT 340 473* 485*  Recent Labs Lab 03/22/15 0415 03/22/15 2106 03/23/15 0720 03/24/15 1110  NA 137 140 140 143  K 3.0* 3.8 3.2* 3.4*  CL 107 107 107 108  CO2 24 24 25 26   BUN <5* <5* <5* <5*  CREATININE 0.54 0.58 0.52 0.40*  CALCIUM 7.5* 8.2* 8.1* 8.1*  PROT 5.1*  --   --   --   BILITOT 0.5  --   --   --   ALKPHOS 43  --   --   --   ALT 7*  --    --   --   AST 11*  --   --   --   GLUCOSE 116* 93 94 124*   GI Panel Negative x2  C diff Negative   Imaging/Diagnostic Tests: Ct Abdomen Pelvis W Contrast  03/21/2015  CLINICAL DATA:  29 year old with abdominal pain and bloody stools for 1 week. History of anemia. EXAM: CT ABDOMEN AND PELVIS WITH CONTRAST TECHNIQUE: Multidetector CT imaging of the abdomen and pelvis was performed using the standard protocol following bolus administration of intravenous contrast. CONTRAST:  36mL OMNIPAQUE IOHEXOL 300 MG/ML  SOLN COMPARISON:  Prior study 01/05/2015. FINDINGS: Lower chest: Clear lung bases. No significant pleural or pericardial effusion. Hepatobiliary: There is probable focal fat adjacent to the falciform ligament, measuring 2.2 cm on image 21, not well seen on prior noncontrast study. No suspicious hepatic findings. No evidence of gallstones, gallbladder wall thickening or biliary dilatation. Pancreas: Unremarkable. No pancreatic ductal dilatation or surrounding inflammatory changes. Spleen: Normal in size without focal abnormality. Adrenals/Urinary Tract: Both adrenal glands appear normal. The kidneys appear normal without evidence of urinary tract calculus, suspicious lesion or hydronephrosis. The bladder is moderately distended. No focal bladder abnormalities are seen. Stomach/Bowel: The stomach, small bowel and appendix appear normal. The appendix is best visualized on coronal image number 40. There is moderate circumferential colonic wall thickening extending from the cecum to the rectum. There is mild pericolonic inflammatory change. No evidence of bowel obstruction or perforation. Vascular/Lymphatic: There are scattered prominent lymph nodes within the mesentery and perirectal space, similar to the prior examination. No significant vascular findings are present. Reproductive: Unremarkable. Other: There is no ascites or extraluminal fluid collection. Small umbilical hernia containing only fat noted.  Musculoskeletal: No acute or significant osseous findings. No evidence of sacroiliitis. IMPRESSION: 1. Pancolitis. Given the findings on the prior study and associated prominent lymph nodes in the mesentery and perirectal space, ulcerative colitis is favored. Pseudomembranous colitis considered less likely. 2. No evidence of bowel obstruction, perforation, abscess or appendicitis. 3. Probable focal fat in the left hepatic lobe. Electronically Signed   By: Richardean Sale M.D.   On: 03/21/2015 18:45    Nicolette Bang, DO 03/25/2015, 8:10 AM PGY-1, [place] Intern pager: [phone], text pages welcome

## 2015-03-25 NOTE — Progress Notes (Signed)
VASCULAR LAB PRELIMINARY  PRELIMINARY  PRELIMINARY  PRELIMINARY  Left upper extremity venous duplex completed. No DVT or superificial thrombus in left arm.    Janifer Adie, RVT, RDMS 03/25/2015, 10:43 AM

## 2015-03-25 NOTE — Progress Notes (Signed)
[facility] PROGRESS NOTE Subjective Still with diarrhea with old blood. C/o legs swelling no abd pain rectal pain better  Objective: Vital signs in last 24 hours: Temp:  [97.7 F (36.5 C)-97.9 F (36.6 C)] 97.7 F (36.5 C) (12/22 0807) Pulse Rate:  [91-106] 91 (12/22 0807) Resp:  [18-20] 18 (12/22 0807) BP: (94-121)/(56-88) 94/56 mmHg (12/22 0807) SpO2:  [100 %] 100 % (12/22 0807) Weight:  [59.467 kg (131 lb 1.6 oz)] 59.467 kg (131 lb 1.6 oz) (12/21 2118) Last BM Date: 03/24/15  Intake/Output from previous day: 12/21 0701 - 12/22 0700 In: 2490.4 [P.O.:480; I.V.:2010.4] Out: 850 [Urine:850] Intake/Output this shift: Total I/O In: 240 [P.O.:240] Out: 300 [Urine:300]  PE: General--NAD  Abdomen--soft flat  Lab Results:  Recent Labs  03/23/15 0720 03/24/15 1110 03/25/15 0754  WBC 9.4 9.2 11.0*  HGB 10.4* 10.1* 9.5*  HCT 33.4* 31.3* 29.7*  PLT 473* 485* 493*   BMET  Recent Labs  03/22/15 2106 03/23/15 0720 03/24/15 1110 03/25/15 0834  NA 140 140 143 143  K 3.8 3.2* 3.4* 3.5  CL 107 107 108 110  CO2 24 25 26 26   CREATININE 0.58 0.52 0.40* 0.49   LFT No results for input(s): PROT, AST, ALT, ALKPHOS, BILITOT, BILIDIR, IBILI in the last 72 hours. PT/INR No results for input(s): LABPROT, INR in the last 72 hours. PANCREAS No results for input(s): LIPASE in the last 72 hours.       Studies/Results: No results found.  Medications: I have reviewed the patient's current medications.  Assessment/Plan: 1. Ulcerative Colitis. Some improvement with high dose IV steroids. Suspect the leg swelling is due to low albumin. Would keep on IV steroids for 1 more day ? Change to oral prednisone tomorrow, continue clears with suppliments.   Erine Phenix JR,Endre Coutts L 03/25/2015, 11:36 AM  Pager: [phone] If no answer or after hours call [phone]

## 2015-03-26 DIAGNOSIS — R609 Edema, unspecified: Secondary | ICD-10-CM

## 2015-03-26 LAB — CBC
HEMATOCRIT: 34.2 % — AB (ref 36.0–46.0)
HEMOGLOBIN: 10.6 g/dL — AB (ref 12.0–15.0)
MCH: 28.3 pg (ref 26.0–34.0)
MCHC: 31 g/dL (ref 30.0–36.0)
MCV: 91.2 fL (ref 78.0–100.0)
Platelets: 593 10*3/uL — ABNORMAL HIGH (ref 150–400)
RBC: 3.75 MIL/uL — ABNORMAL LOW (ref 3.87–5.11)
RDW: 14 % (ref 11.5–15.5)
WBC: 13.6 10*3/uL — AB (ref 4.0–10.5)

## 2015-03-26 LAB — BASIC METABOLIC PANEL
ANION GAP: 10 (ref 5–15)
CHLORIDE: 106 mmol/L (ref 101–111)
CO2: 25 mmol/L (ref 22–32)
Calcium: 8.4 mg/dL — ABNORMAL LOW (ref 8.9–10.3)
Creatinine, Ser: 0.58 mg/dL (ref 0.44–1.00)
GFR calc Af Amer: >60 mL/min (ref >60)
GFR calc non Af Amer: >60 mL/min (ref >60)
GLUCOSE: 106 mg/dL — AB (ref 65–99)
POTASSIUM: 3.5 mmol/L (ref 3.5–5.1)
Sodium: 141 mmol/L (ref 135–145)

## 2015-03-26 MED ORDER — PREDNISONE 50 MG PO TABS
50.0000 mg | ORAL_TABLET | Freq: Two times a day (BID) | ORAL | Status: DC
  Administered 2015-03-26 – 2015-03-27 (×3): 50 mg via ORAL
  Filled 2015-03-26 (×3): qty 1

## 2015-03-26 MED ORDER — OXYCODONE HCL 15 MG PO TABS: 7.5000 mg | ORAL_TABLET | ORAL | Status: DC | PRN

## 2015-03-26 MED ORDER — PREDNISONE 50 MG PO TABS: 50.0000 mg | ORAL_TABLET | Freq: Two times a day (BID) | ORAL | Status: DC

## 2015-03-26 MED ORDER — BOOST / RESOURCE BREEZE PO LIQD
1.0000 | Freq: Three times a day (TID) | ORAL | Status: DC
Start: 2015-03-26 — End: 2015-03-27

## 2015-03-26 NOTE — Progress Notes (Signed)
Family Medicine Teaching Service Daily Progress Note Intern Pager: [phone]  Patient name: Monica Green Medical record number: [id number] Date of birth: 1985-06-04 Age: 29 y.o. Gender: female  Primary Care Provider: No primary care provider on file. Consultants: GI Code Status: FULL   Pt Overview and Major Events to Date:  12/19: Pt admitted for hematochezia with abdominal pain  12/20: Colonoscopy with tissue biopsy performed; IV Solumedrol started  12/21: Biopsy consistent with UC  Assessment and Plan: Monica Green is a 29 y.o. female presenting with generalized weakness x 1 weak and rectal bleeding. PMH is significant for IBS, right ureterolithiasis in 01/2015 and severe iron deficiency anemia.  Hematochezia with abdominal pain: With loose, frequent stools since GI appointment 03/16/15. CT abdomen pelvis showed pancolitis.+FOBT. Tissue transglutaminase Ab <2 (01/05/15). Colonoscopy performed with tissue biopsy on 12/20. Revealed severe pancolitis. Tissue biopsy came back positive for Ulcerative colitis.  - GI consulted, appreciate recs>> switch to oral prednisone today  - patient currently on clear liquids > advance diet to soft - IV Solu-Medrol x3d; discontinued and start PO prednisone 100mg  qd - Trend Hgb; stable - Oxycodone 7.5 mg q4h prn pain -GI panel: all negative; repeat GI panel negative  -C diff negative  Left UE Edema, Improved: Had left PIV removed overnight. L arm edematous to shoulder, TTP and firm in areas. Patient is elevating arm with heating packs.  -UE doppler negative for thrombus -continued elevation   Tachycardia, Resolved: HR to 137 in ED, improved with IVFs.  Hypokalemia, Resolved: Stable at 3.5. K 2.9 on admission with potential T wave blunting on EKG. Hypokalemia likely due to GI losses. Repleted with oral K solution because pt refused Kdur (2/2 to making nauseous in past) and K in IVF was burning.   Anemia: Chronic, stable. Hgb 12.5 with  MCV normal at 87.1 (was 67.9 01/06/15) on admission.  - Daily CBCs; HgB 10.6 12/23  Thrombocytosis: Chronic, since 2013 at least. Suspect 2/2 chronic inflammatory process.  - 658 on admission, as high as 833 01/05/15.  - Plts 593 on 12/23, improving   FEN/GI: ADAT, SLIV  Prophylaxis: pt refused heparin; SCDs  Disposition: Possible home later today vs tomorrow if stable; f/u GI recs  Subjective:  Doing well this morning. States that she is having less bowel movements and no more BRBPR. She did have some brown blood yesterday. Has also been tolerating liquid diet well. She is wondering when she will be able to go home. Pain has improved.   Objective: Temp:  [97.7 F (36.5 C)-98.2 F (36.8 C)] 97.9 F (36.6 C) (12/23 0525) Pulse Rate:  [72-98] 98 (12/23 0525) Resp:  [17-20] 20 (12/23 0525) BP: (94-125)/(56-86) 125/86 mmHg (12/23 0525) SpO2:  [100 %] 100 % (12/23 0525) Physical Exam: General: thin, young woman lying in bed in NAD, pleasant  Cardiovascular: RRR  Respiratory: CTAB. No wheezes or crackles.  Abdomen: +BS, mildly diffusely tender, mildly distended, no rebound or guarding  Ext: 1+ pitting edema to thighs of LE bilaterally   Laboratory:  Recent Labs Lab 03/24/15 1110 03/25/15 0754 03/26/15 0601  WBC 9.2 11.0* 13.6*  HGB 10.1* 9.5* 10.6*  HCT 31.3* 29.7* 34.2*  PLT 485* 493* 593*    Recent Labs Lab 03/22/15 0415  03/23/15 0720 03/24/15 1110 03/25/15 0834  NA 137  < > 140 143 143  K 3.0*  < > 3.2* 3.4* 3.5  CL 107  < > 107 108 110  CO2 24  < > 25 26  26  BUN <5*  < > <5* <5* <5*  CREATININE 0.54  < > 0.52 0.40* 0.49  CALCIUM 7.5*  < > 8.1* 8.1* 8.1*  PROT 5.1*  --   --   --   --   BILITOT 0.5  --   --   --   --   ALKPHOS 43  --   --   --   --   ALT 7*  --   --   --   --   AST 11*  --   --   --   --   GLUCOSE 116*  < > 94 124* 132*  < > = values in this interval not displayed. GI Panel Negative x2  C diff Negative   Imaging/Diagnostic Tests: Ct  Abdomen Pelvis W Contrast  03/21/2015  CLINICAL DATA:  29 year old with abdominal pain and bloody stools for 1 week. History of anemia. EXAM: CT ABDOMEN AND PELVIS WITH CONTRAST TECHNIQUE: Multidetector CT imaging of the abdomen and pelvis was performed using the standard protocol following bolus administration of intravenous contrast. CONTRAST:  33mL OMNIPAQUE IOHEXOL 300 MG/ML  SOLN COMPARISON:  Prior study 01/05/2015. FINDINGS: Lower chest: Clear lung bases. No significant pleural or pericardial effusion. Hepatobiliary: There is probable focal fat adjacent to the falciform ligament, measuring 2.2 cm on image 21, not well seen on prior noncontrast study. No suspicious hepatic findings. No evidence of gallstones, gallbladder wall thickening or biliary dilatation. Pancreas: Unremarkable. No pancreatic ductal dilatation or surrounding inflammatory changes. Spleen: Normal in size without focal abnormality. Adrenals/Urinary Tract: Both adrenal glands appear normal. The kidneys appear normal without evidence of urinary tract calculus, suspicious lesion or hydronephrosis. The bladder is moderately distended. No focal bladder abnormalities are seen. Stomach/Bowel: The stomach, small bowel and appendix appear normal. The appendix is best visualized on coronal image number 40. There is moderate circumferential colonic wall thickening extending from the cecum to the rectum. There is mild pericolonic inflammatory change. No evidence of bowel obstruction or perforation. Vascular/Lymphatic: There are scattered prominent lymph nodes within the mesentery and perirectal space, similar to the prior examination. No significant vascular findings are present. Reproductive: Unremarkable. Other: There is no ascites or extraluminal fluid collection. Small umbilical hernia containing only fat noted. Musculoskeletal: No acute or significant osseous findings. No evidence of sacroiliitis. IMPRESSION: 1. Pancolitis. Given the findings on  the prior study and associated prominent lymph nodes in the mesentery and perirectal space, ulcerative colitis is favored. Pseudomembranous colitis considered less likely. 2. No evidence of bowel obstruction, perforation, abscess or appendicitis. 3. Probable focal fat in the left hepatic lobe. Electronically Signed   By: Richardean Sale M.D.   On: 03/21/2015 18:45    Katheren Shams, DO 03/26/2015, 7:28 AM PGY-2, [place] Intern pager: [phone], text pages welcome

## 2015-03-26 NOTE — Progress Notes (Signed)
[facility] PROGRESS NOTE Subjective patient tolerating diet. She is on clear liquid supplements. She was switched to oral prednisone today she notes that the diarrhea is slowing down and she is having very little dark stool at this time  Objective: Vital signs in last 24 hours: Temp:  [97.9 F (36.6 C)-98.2 F (36.8 C)] 97.9 F (36.6 C) (12/23 0525) Pulse Rate:  [72-98] 98 (12/23 0525) Resp:  [17-20] 20 (12/23 0525) BP: (108-125)/(78-86) 125/86 mmHg (12/23 0525) SpO2:  [100 %] 100 % (12/23 0525) Last BM Date: 03/25/15  Intake/Output from previous day: 12/22 0701 - 12/23 0700 In: 1080 [P.O.:1080] Out: 900 [Urine:900] Intake/Output this shift:    PE: General-- no acute distress   Abdomen-- nondistended and soft  Lab Results:  Recent Labs  03/24/15 1110 03/25/15 0754 03/26/15 0601  WBC 9.2 11.0* 13.6*  HGB 10.1* 9.5* 10.6*  HCT 31.3* 29.7* 34.2*  PLT 485* 493* 593*   BMET  Recent Labs  03/24/15 1110 03/25/15 0834 03/26/15 0601  NA 143 143 141  K 3.4* 3.5 3.5  CL 108 110 106  CO2 26 26 25   CREATININE 0.40* 0.49 0.58   LFT No results for input(s): PROT, AST, ALT, ALKPHOS, BILITOT, BILIDIR, IBILI in the last 72 hours. PT/INR No results for input(s): LABPROT, INR in the last 72 hours. PANCREAS No results for input(s): LIPASE in the last 72 hours.       Studies/Results: No results found.  Medications: I have reviewed the patient's current medications.  Assessment/Plan: 1. Severe ulcerative colitis. Probable pancolitis. Improving on steroids. Long discussion with she and her mom about slowly advancing her diet with scrambled eggs mashed potatoes noodles etc. and avoiding all residue for several days. She seems to be tolerating oral prednisone and if she does well with this I think she could be discharged. Either this evening or in the morning   Aaryav Hopfensperger JR,Mekaylah Klich L 03/26/2015, 9:15 AM  Pager: [phone] If no answer or after hours call  [phone]

## 2015-03-27 LAB — BASIC METABOLIC PANEL
ANION GAP: 11 (ref 5–15)
BUN: <5 mg/dL — ABNORMAL LOW (ref 6–20)
CALCIUM: 8.6 mg/dL — AB (ref 8.9–10.3)
CHLORIDE: 103 mmol/L (ref 101–111)
CO2: 28 mmol/L (ref 22–32)
Creatinine, Ser: 0.56 mg/dL (ref 0.44–1.00)
GFR calc non Af Amer: >60 mL/min (ref >60)
Glucose, Bld: 101 mg/dL — ABNORMAL HIGH (ref 65–99)
Potassium: 3.5 mmol/L (ref 3.5–5.1)
SODIUM: 142 mmol/L (ref 135–145)

## 2015-03-27 MED ORDER — BOOST / RESOURCE BREEZE PO LIQD: 1.0000 | Freq: Three times a day (TID) | ORAL | Status: DC

## 2015-03-27 MED ORDER — PREDNISONE 10 MG PO TABS: ORAL_TABLET | ORAL | Status: DC

## 2015-03-27 NOTE — Discharge Instructions (Signed)
Please make sure to keep all scheduled appointments. You will need to be on prednisone for a couple months. Your GI doctor will adjust doses. Sending you out on prednisone 10mg  tablets. Please take 5 tabs (50mg  total) twice a day with meals. Total of 100mg  a day.   IMPORTANT!!!! PLEASE CALL DR. Oletta Lamas OFFICE ON Tuesday. He wants to know how you are doing and if you can decrease your medications.    Ulcerative Colitis, Adult Ulcerative colitis is long-lasting (chronic) swelling (inflammation) of the large intestine (colon). Sores (ulcers) may also form on the colon.  Ulcerative colitis is closely related to another condition of inflammation of the intestines that is called Crohn disease. Together, they are frequently referred to as inflammatory bowel disease (IBD).  CAUSES  Ulcerative colitis is caused by increased activity of the immune system in the intestines. The immune system is the system that protects the body against harmful bacteria, viruses, fungi, and other things that can make you sick. When the immune system overacts, it causes inflammation. The cause of the increased immune system activity is not known.  RISK FACTORS Risk factors of ulcerative colitis include:   Age. This includes:  Being 53-76 years old.  Being older than 30 years old.  Having a family history of ulcerative colitis.   Being of Jewish descent. SIGNS AND SYMPTOMS  Common symptoms of ulcerative colitis include rectal bleeding and diarrhea. There is a wide range of symptoms, and a person's symptoms depend on how severe the condition is. Additional symptoms may include:   Pain or cramping in the belly (abdomen).   Fever.   Fatigue.   Weight loss.   Night sweats.   Rectal pain.   Feeling the immediate need to have a bowel movement.   Nausea.  Loss of appetite.  Anemia.  Joint pain or soreness.  Eye irritation.  Certain skin rashes. DIAGNOSIS  Ulcerative colitis may be diagnosed  by:  Medical history and physical exam.  Blood tests and stool tests.  X-rays.  CT scans.  Colonoscopy. For this test, a flexible tube is inserted into your anus and your colon is examined.  Examination of a tissue sample from your colon (biopsy). TREATMENT  Treatment for ulcerative colitis may include medicines to:  Decrease inflammation.  Control your immune system. Surgery may also be necessary.  HOME CARE INSTRUCTIONS  Medicines and Vitamins  Take medicines only as directed by your doctor. Do not take aspirin.  Ask your doctor if you should take any vitamins or supplements. Lifestyle  Exercise regularly.  Limit alcohol intake to no more than 1 drink per day for nonpregnant women and 2 drinks per day for men. One drink equals 12 ounces of beer, 5 ounces of wine, or 1 ounces of hard liquor. Eating and Drinking  Drink enough fluid to keep your urine clear or pale yellow.  Ask your health care provider about the best diet for you. Follow the diet as directed by your health care provider. This may include:  Avoiding carbonated drinks.  Avoiding popcorn, vegetable skins, nuts, and other high-fiber foods when you have symptoms of ulcerative colitis.  Eating smaller meals more often.  Keeping a food diary. This may help you to find and avoid any foods that make you feel not well.  Limit your caffeine intake. General Instructions  Keep all follow-up appointments as directed by your health care provider. This is important. SEEK MEDICAL CARE IF:   Your symptoms do not improve or get worse  with treatment.   You continue to lose weight.  You have constant cramps or loose bowels.   You develop a new skin rash, skin sores, or eye problems.  You have a fever or chills. SEEK IMMEDIATE MEDICAL CARE IF:   You have bloody diarrhea.   You have severe pain in your abdomen.   You vomit.   This information is not intended to replace advice given to you by your  health care provider. Make sure you discuss any questions you have with your health care provider.   Document Released: 12/28/2004 Document Revised: 12/09/2014 Document Reviewed: 11/05/2013 Elsevier Interactive Patient Education [employer].

## 2015-03-27 NOTE — Progress Notes (Signed)
Pt provided with discharge instructions including information on follow up appointments and new medications. Pt verbalized understanding of all information. IV was dc'd with no complication. VSS. Pt escorted out via wheelchair by this RN.

## 2015-03-27 NOTE — Care Management Note (Signed)
Case Management Note  Patient Details  Name: Monica Green MRN: [id number] Date of Birth: [birth year]  Subjective/Objective:      Ulcerative Colitis              Action/Plan: NCM spoke to pt and states she applied for the [place] that will allow her to receive her medications and see a physician at a discount. She missed her appointment. Pt used the [facility] letter in Oct 2016 and unable to utilize program again until next 01/2016. Pt states she will speak to her family members to help assist her with getting her medications. Pt plans to follow up with her PCP to help with getting signed up for disability. Pt states she is unable to work due to her illness.   Expected Discharge Date:  03/27/2015               Expected Discharge Plan:  Home/Self Care  In-House Referral:  NA  Discharge planning Services  CM Consult, Medication Assistance, [facility]  Post Acute Care Choice:  NA Choice offered to:  NA  DME Arranged:  N/A DME Agency:  NA  HH Arranged:  NA HH Agency:  NA  Status of Service:  Completed, signed off  Medicare Important Message Given:    Date Medicare IM Given:    Medicare IM give by:    Date Additional Medicare IM Given:    Additional Medicare Important Message give by:     If discussed at [place] of Stay Meetings, dates discussed:    Additional Comments:  Erenest Rasher, RN 03/27/2015, 9:49 AM

## 2015-04-07 ENCOUNTER — Ambulatory Visit (INDEPENDENT_AMBULATORY_CARE_PROVIDER_SITE_OTHER): Payer: Self-pay | Admitting: Obstetrics and Gynecology

## 2015-04-07 VITALS — BP 97/60 | HR 62 | Temp 98.5°F | Ht 66.0 in | Wt 110.2 lb

## 2015-04-07 DIAGNOSIS — D509 Iron deficiency anemia, unspecified: Secondary | ICD-10-CM

## 2015-04-07 DIAGNOSIS — Z09 Encounter for follow-up examination after completed treatment for conditions other than malignant neoplasm: Secondary | ICD-10-CM

## 2015-04-07 DIAGNOSIS — E43 Unspecified severe protein-calorie malnutrition: Secondary | ICD-10-CM

## 2015-04-07 DIAGNOSIS — K51011 Ulcerative (chronic) pancolitis with rectal bleeding: Secondary | ICD-10-CM

## 2015-04-07 LAB — COMPREHENSIVE METABOLIC PANEL
ALK PHOS: 47 U/L (ref 33–115)
ALT: 21 U/L (ref 6–29)
AST: 13 U/L (ref 10–30)
Albumin: 2.8 g/dL — ABNORMAL LOW (ref 3.6–5.1)
BUN: 8 mg/dL (ref 7–25)
CALCIUM: 8.6 mg/dL (ref 8.6–10.2)
CO2: 27 mmol/L (ref 20–31)
Chloride: 100 mmol/L (ref 98–110)
Creat: 0.43 mg/dL — ABNORMAL LOW (ref 0.50–1.10)
Glucose, Bld: 98 mg/dL (ref 65–99)
POTASSIUM: 3.6 mmol/L (ref 3.5–5.3)
Sodium: 137 mmol/L (ref 135–146)
TOTAL PROTEIN: 5.9 g/dL — AB (ref 6.1–8.1)
Total Bilirubin: 0.3 mg/dL (ref 0.2–1.2)

## 2015-04-07 LAB — CBC
HEMATOCRIT: 33.7 % — AB (ref 36.0–46.0)
Hemoglobin: 10.5 g/dL — ABNORMAL LOW (ref 12.0–15.0)
MCH: 27.7 pg (ref 26.0–34.0)
MCHC: 31.2 g/dL (ref 30.0–36.0)
MCV: 88.9 fL (ref 78.0–100.0)
MPV: 8.4 fL — AB (ref 8.6–12.4)
Platelets: 533 10*3/uL — ABNORMAL HIGH (ref 150–400)
RBC: 3.79 MIL/uL — AB (ref 3.87–5.11)
RDW: 14.9 % (ref 11.5–15.5)
WBC: 6 10*3/uL (ref 4.0–10.5)

## 2015-04-07 NOTE — Progress Notes (Signed)
Subjective: Chief Complaint  Patient presents with  . Follow-up    hospitalization     HPI: Monica Green is a 30 y.o. presenting to clinic today to discuss the following:  # Hospital Follow-up: -patient was diagnosed with ulcerative colitis in the hospital; had severe pancolitis -she presented with bloody diarrhea -found to have iron deficiency anemia as well -patient started on steroids with improvement in symptoms; continues to take 100mg  daily -she continues to take her steroids -doing well on soft/liquid diet -continues to endorse intermittent abdominal pain  -does not feel back to baseline yet -using the bathroom >3x a day still but stool is loose and not diarrhea and no blood associated with BM -states she wakes during the night to use the bathroom -has not followed with GI yet as she was late to her appointment; following with Dr. Alferd Apa GI - was not able to get another appointment until mid-Feb.   Of note, patient states that she may be moving back to [place] with her parents in the next 1-2 weeks. Wanting to know what she should do as far as medications for her UC.    Health Maintenance: Needs to establish care as new patient first.     ROS noted in HPI.  Past Medical, Surgical, Social, and Family History Reviewed & Updated per EMR.   Objective: BP 97/60 mmHg  Pulse 62  Temp(Src) 98.5 F (36.9 C) (Oral)  Ht 5\' 6"  (1.676 m)  Wt 110 lb 3.2 oz (49.986 kg)  BMI 17.80 kg/m2  LMP 02/28/2015  Physical Exam  Constitutional: She is well-developed, well-nourished, and in no distress.  Cardiovascular: Normal rate.   Pulmonary/Chest: Effort normal.  Abdominal: Soft. Bowel sounds are normal.  Mild diffuse tenderness to deep palpation  Neurological: She is alert.  Skin: Skin is warm and dry.    Results for orders placed or performed in visit on 04/07/15 (from the past 72 hour(s))  CBC     Status: Abnormal   Collection Time: 04/07/15  3:38 PM    Result Value Ref Range   WBC 6.0 4.0 - 10.5 K/uL   RBC 3.79 (L) 3.87 - 5.11 MIL/uL   Hemoglobin 10.5 (L) 12.0 - 15.0 g/dL   HCT 33.7 (L) 36.0 - 46.0 %   MCV 88.9 78.0 - 100.0 fL   MCH 27.7 26.0 - 34.0 pg   MCHC 31.2 30.0 - 36.0 g/dL   RDW 14.9 11.5 - 15.5 %   Platelets 533 (H) 150 - 400 K/uL   MPV 8.4 (L) 8.6 - 12.4 fL  Comprehensive metabolic panel     Status: Abnormal   Collection Time: 04/07/15  3:38 PM  Result Value Ref Range   Sodium 137 135 - 146 mmol/L   Potassium 3.6 3.5 - 5.3 mmol/L   Chloride 100 98 - 110 mmol/L   CO2 27 20 - 31 mmol/L   Glucose, Bld 98 65 - 99 mg/dL   BUN 8 7 - 25 mg/dL   Creat 0.43 (L) 0.50 - 1.10 mg/dL   Total Bilirubin 0.3 0.2 - 1.2 mg/dL   Alkaline Phosphatase 47 33 - 115 U/L   AST 13 10 - 30 U/L   ALT 21 6 - 29 U/L   Total Protein 5.9 (L) 6.1 - 8.1 g/dL   Albumin 2.8 (L) 3.6 - 5.1 g/dL   Calcium 8.6 8.6 - 10.2 mg/dL    Assessment/Plan: 1. Hospital discharge follow-up: Recently hospitalized on 12/18 with new  diagnosis of ulcerative colitis. She presented with recurrent bloddy diarrhea and severe abdominal pain. Colonscopy and biposy confirmed diagnosis. She was started on steriods at that time which helped improve symtpoms. Denies any more blood stool. Still has frequent BMs and intermittent abdominal pain. She has not been able to follow-up with GI.  -called to discuss trying to get patient into office before Februruy due to patient possibly relocationg soon. -appointment made for 04/19/15 -advance diet to regular -encouraged patient to get up out of bed -will obtain blood work to make sure anemia and electrolytes are stable -discussed that patient will likely be started on control medication by GI to help with symptoms -handout given  Needs new patient appointment to establish care and physical. Will hold off on scheduling since patient may be relocating.    Orders Placed This Encounter  Procedures  . CBC  . Comprehensive metabolic  panel    Order Specific Question:  Has the patient fasted?    Answer:  No    Luiz Blare, DO 04/07/2015, 3:09 PM PGY-2, [place]

## 2015-04-07 NOTE — Patient Instructions (Signed)
Talked to [facility] you have a tentative appointment for Jan. 16th at 11:15am with Dr. Oletta Lamas PA  They also put you on the cancellation list so please make sure to look out for their calls  Will not start any medications today  Continue regimen  Can advance diet to regular  Obtaining blood work to make sure you are still stable. Will call with results.    Ulcerative Colitis, Adult Ulcerative colitis is long-lasting (chronic) swelling (inflammation) of the large intestine (colon). Sores (ulcers) may also form on the colon.  Ulcerative colitis is closely related to another condition of inflammation of the intestines that is called Crohn disease. Together, they are frequently referred to as inflammatory bowel disease (IBD).  CAUSES  Ulcerative colitis is caused by increased activity of the immune system in the intestines. The immune system is the system that protects the body against harmful bacteria, viruses, fungi, and other things that can make you sick. When the immune system overacts, it causes inflammation. The cause of the increased immune system activity is not known.  RISK FACTORS Risk factors of ulcerative colitis include:   Age. This includes:  Being 60-63 years old.  Being older than 31 years old.  Having a family history of ulcerative colitis.   Being of Jewish descent. SIGNS AND SYMPTOMS  Common symptoms of ulcerative colitis include rectal bleeding and diarrhea. There is a wide range of symptoms, and a person's symptoms depend on how severe the condition is. Additional symptoms may include:   Pain or cramping in the belly (abdomen).   Fever.   Fatigue.   Weight loss.   Night sweats.   Rectal pain.   Feeling the immediate need to have a bowel movement.   Nausea.  Loss of appetite.  Anemia.  Joint pain or soreness.  Eye irritation.  Certain skin rashes. DIAGNOSIS  Ulcerative colitis may be diagnosed by:  Medical history and physical  exam.  Blood tests and stool tests.  X-rays.  CT scans.  Colonoscopy. For this test, a flexible tube is inserted into your anus and your colon is examined.  Examination of a tissue sample from your colon (biopsy). TREATMENT  Treatment for ulcerative colitis may include medicines to:  Decrease inflammation.  Control your immune system. Surgery may also be necessary.  HOME CARE INSTRUCTIONS  Medicines and Vitamins  Take medicines only as directed by your doctor. Do not take aspirin.  Ask your doctor if you should take any vitamins or supplements. Lifestyle  Exercise regularly.  Limit alcohol intake to no more than 1 drink per day for nonpregnant women and 2 drinks per day for men. One drink equals 12 ounces of beer, 5 ounces of wine, or 1 ounces of hard liquor. Eating and Drinking  Drink enough fluid to keep your urine clear or pale yellow.  Ask your health care provider about the best diet for you. Follow the diet as directed by your health care provider. This may include:  Avoiding carbonated drinks.  Avoiding popcorn, vegetable skins, nuts, and other high-fiber foods when you have symptoms of ulcerative colitis.  Eating smaller meals more often.  Keeping a food diary. This may help you to find and avoid any foods that make you feel not well.  Limit your caffeine intake. General Instructions  Keep all follow-up appointments as directed by your health care provider. This is important. SEEK MEDICAL CARE IF:   Your symptoms do not improve or get worse with treatment.  You continue to lose weight.  You have constant cramps or loose bowels.   You develop a new skin rash, skin sores, or eye problems.  You have a fever or chills. SEEK IMMEDIATE MEDICAL CARE IF:   You have bloody diarrhea.   You have severe pain in your abdomen.   You vomit.   This information is not intended to replace advice given to you by your health care provider. Make sure you  discuss any questions you have with your health care provider.   Document Released: 12/28/2004 Document Revised: 12/09/2014 Document Reviewed: 11/05/2013 Elsevier Interactive Patient Education [employer].

## 2015-04-08 ENCOUNTER — Encounter: Payer: Self-pay | Admitting: Obstetrics and Gynecology

## 2015-04-08 DIAGNOSIS — K5190 Ulcerative colitis, unspecified, without complications: Secondary | ICD-10-CM | POA: Insufficient documentation

## 2015-04-09 ENCOUNTER — Ambulatory Visit: Payer: Self-pay | Attending: Internal Medicine

## 2015-04-12 ENCOUNTER — Other Ambulatory Visit: Payer: Self-pay | Admitting: Obstetrics and Gynecology

## 2015-04-13 ENCOUNTER — Other Ambulatory Visit: Payer: Self-pay | Admitting: Obstetrics and Gynecology

## 2015-04-14 MED ORDER — OXYCODONE HCL 15 MG PO TABS: 7.5000 mg | ORAL_TABLET | ORAL | Status: DC | PRN

## 2015-04-14 NOTE — Telephone Encounter (Signed)
Medication ready for pick up at front office. Prednisone was sent to pharmacy.

## 2015-04-14 NOTE — Addendum Note (Signed)
Addended by: Katheren Shams on: 04/14/2015 05:03 PM   Modules accepted: Orders

## 2015-04-15 NOTE — Telephone Encounter (Signed)
Script is on blue team but needs a signature before patient can pick up. Jazmin Hartsell,CMA

## 2015-04-16 NOTE — Telephone Encounter (Signed)
I will be in to sign it during lunch break! Thanks for reminder.

## 2015-04-16 NOTE — Telephone Encounter (Signed)
LM for patient that script is ready for pick up. Jazmin Hartsell,CMA  

## 2017-01-08 IMAGING — CT CT ABD-PELV W/ CM
2 of 4 series · 11 of 46 positions shown, 12 images · IV contrast (omnipaque)
Comparison: Prior study 01/05/2015.

CLINICAL DATA: 29-year-old with abdominal pain and bloody stools
for 1 week. History of anemia.

EXAM:
CT ABDOMEN AND PELVIS WITH CONTRAST
TECHNIQUE: Multidetector CT imaging of the abdomen and pelvis was performed
using the standard protocol following bolus administration of
intravenous contrast.
CONTRAST:  85mL OMNIPAQUE IOHEXOL 300 MG/ML  SOLN

[Series 201: routine, idose (2) · axial · 0.67mm/px · z∈[+99,+419]mm · 8 of 78 slices shown, 9 images]
[im 7/78  soft-tissue]
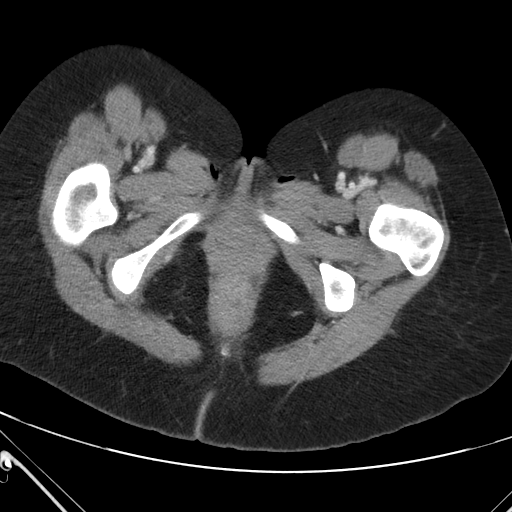
[im 7/78  bone]
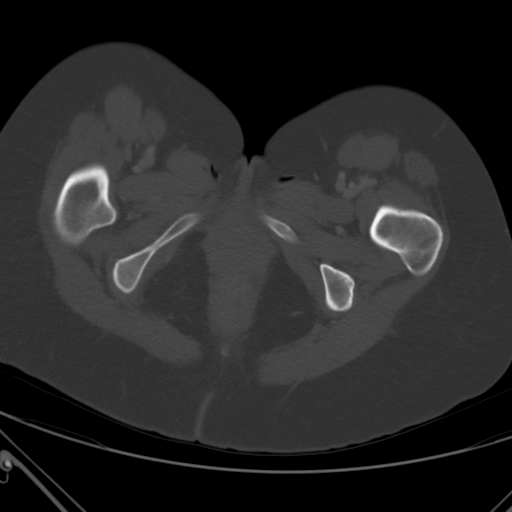
[im 17/78  soft-tissue]
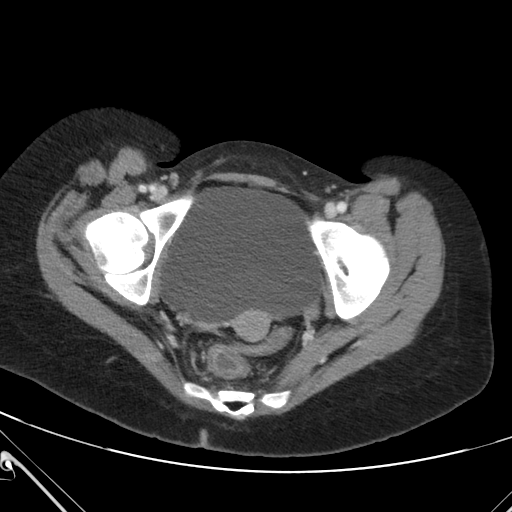
[im 26/78  soft-tissue]
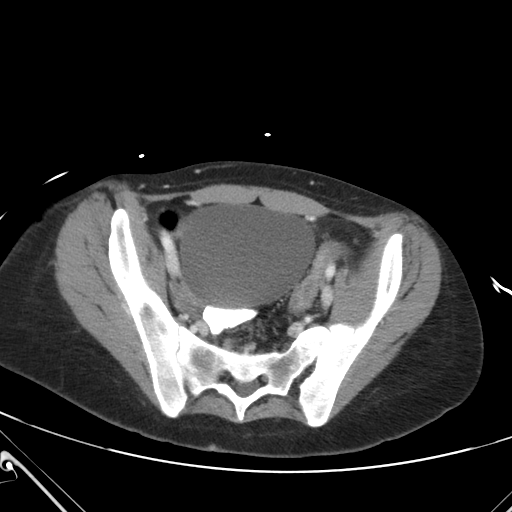
[im 36/78  soft-tissue]
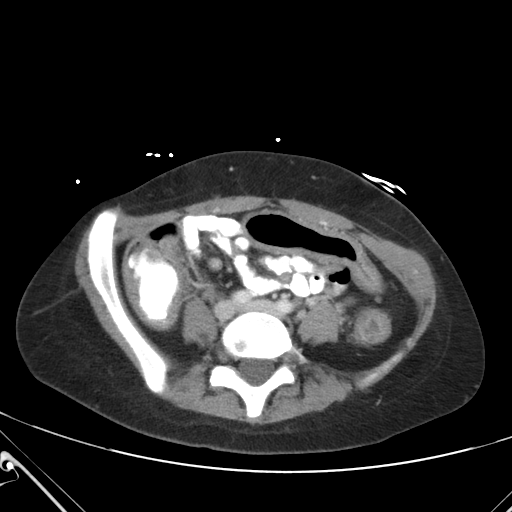
[im 42/78  soft-tissue]
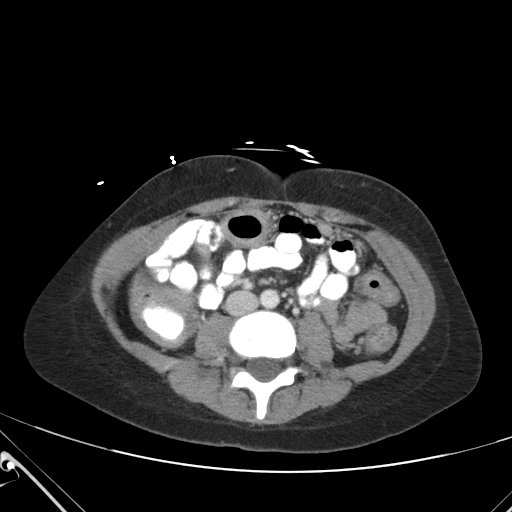
[im 52/78  soft-tissue]
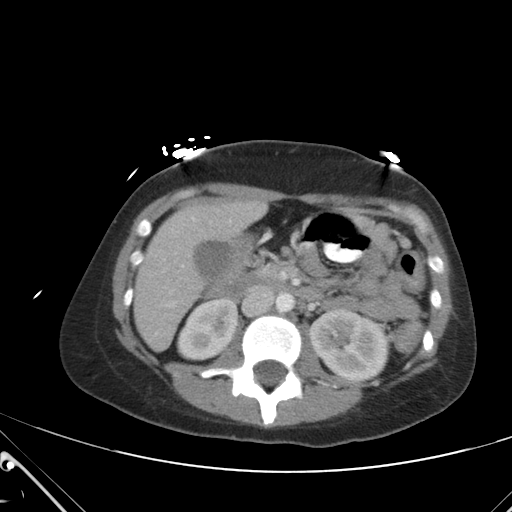
[im 61/78  soft-tissue]
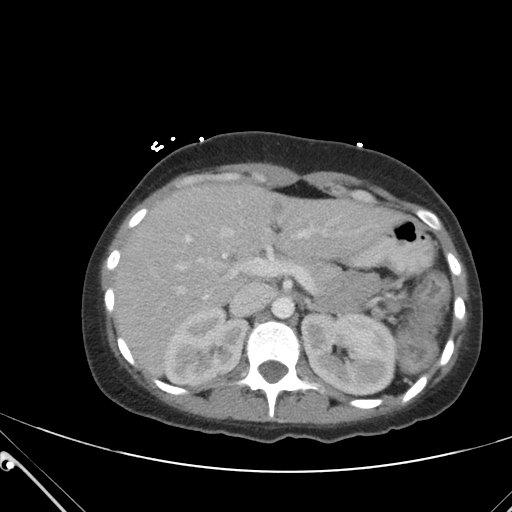
[im 71/78  soft-tissue]
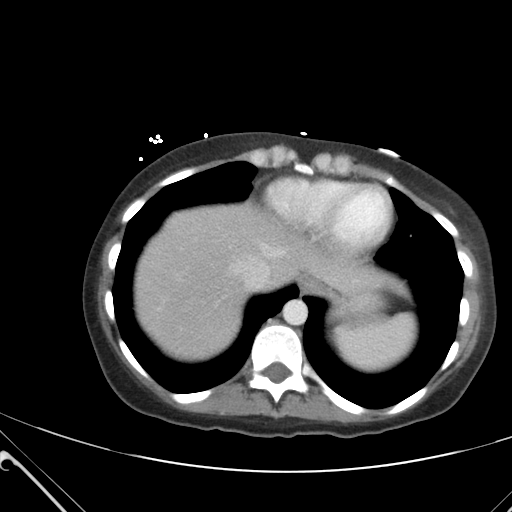

[Series 206: coronals, idose (2) · coronal · 0.45mm/px · 3 of 80 slices shown]
[im 27/80  soft-tissue]
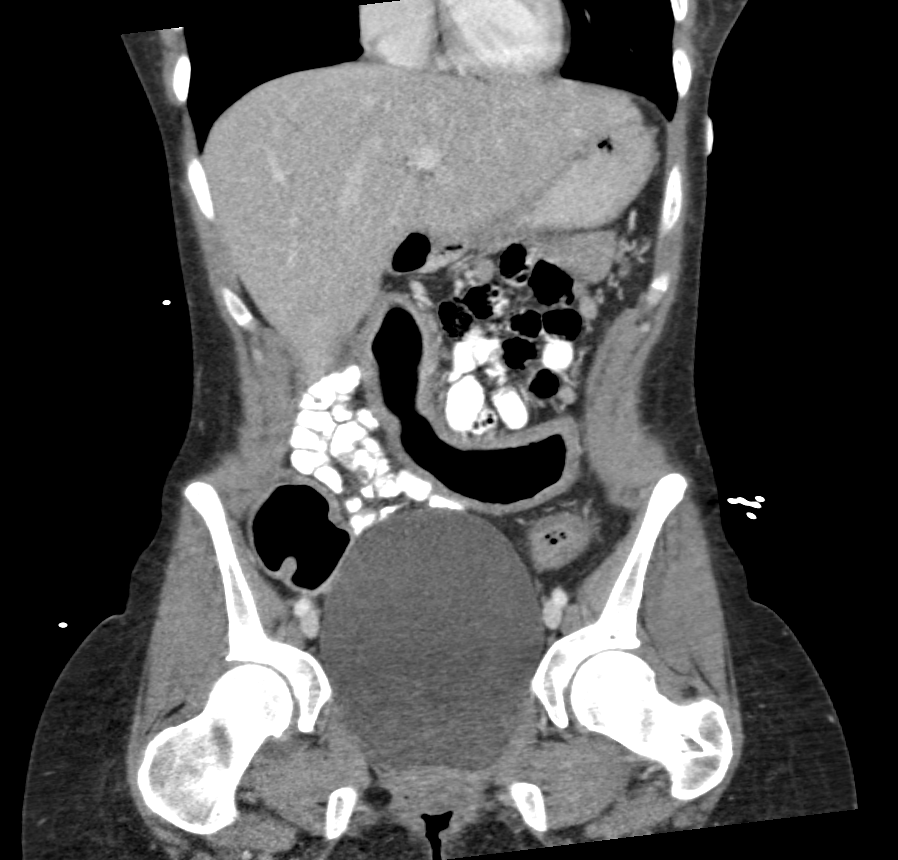
[im 36/80  soft-tissue]
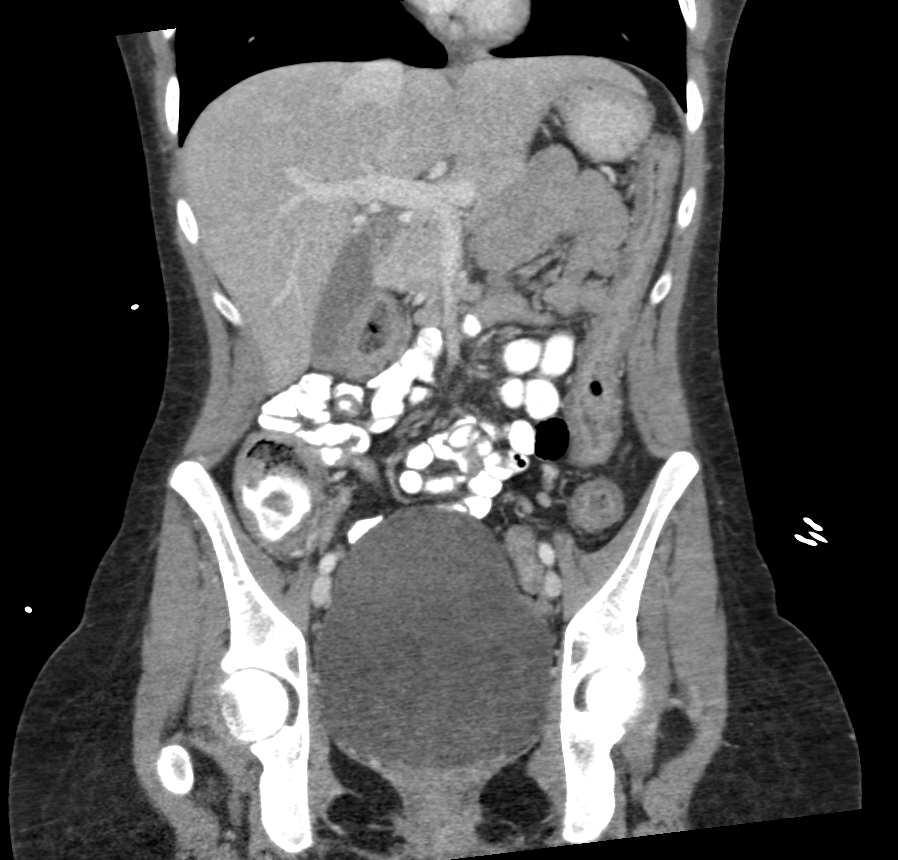
[im 44/80  soft-tissue]
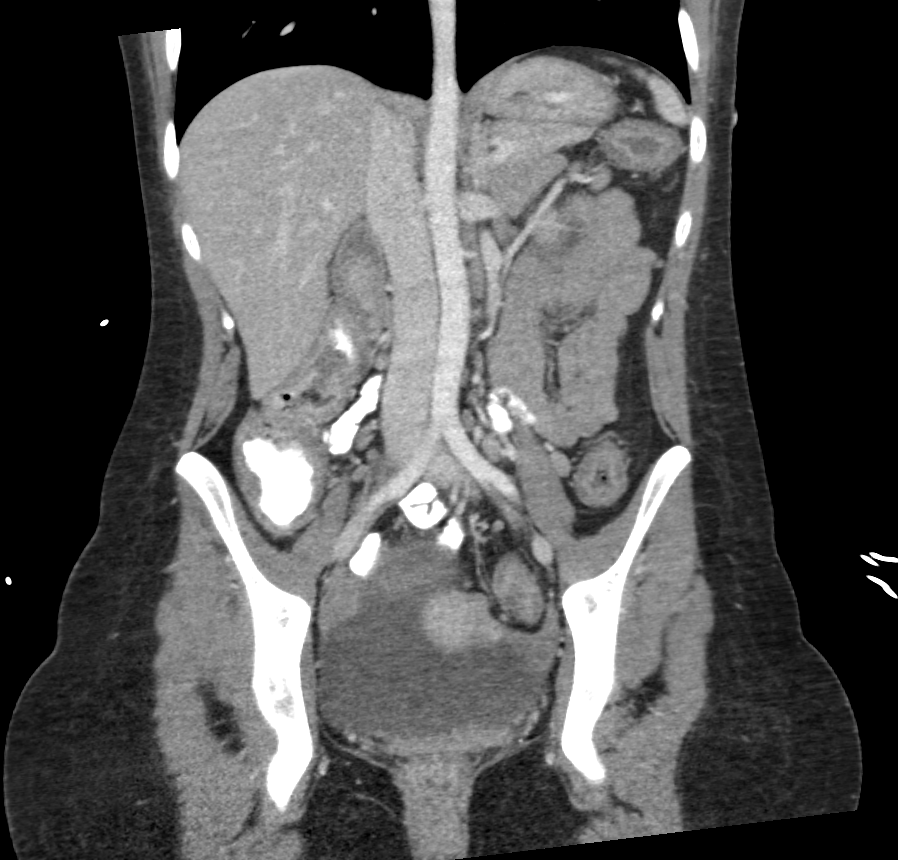

[11 of 46 positions shown; findings below may reference images not displayed]

FINDINGS: Lower chest: Clear lung bases. No significant pleural or pericardial
effusion.

Hepatobiliary: There is probable focal fat adjacent to the falciform
ligament, measuring 2.2 cm on image 21, not well seen on prior
noncontrast study. No suspicious hepatic findings. No evidence of
gallstones, gallbladder wall thickening or biliary dilatation.

Pancreas: Unremarkable. No pancreatic ductal dilatation or
surrounding inflammatory changes.

Spleen: Normal in size without focal abnormality.

Adrenals/Urinary Tract: Both adrenal glands appear normal. The
kidneys appear normal without evidence of urinary tract calculus,
suspicious lesion or hydronephrosis. The bladder is moderately
distended. No focal bladder abnormalities are seen.

Stomach/Bowel: The stomach, small bowel and appendix appear normal.
The appendix is best visualized on coronal image number 40. There is
moderate circumferential colonic wall thickening extending from the
cecum to the rectum. There is mild pericolonic inflammatory change.
No evidence of bowel obstruction or perforation.

Vascular/Lymphatic: There are scattered prominent lymph nodes within
the mesentery and perirectal space, similar to the prior
examination. No significant vascular findings are present.

Reproductive: Unremarkable.

Other: There is no ascites or extraluminal fluid collection. Small
umbilical hernia containing only fat noted.

Musculoskeletal: No acute or significant osseous findings. No
evidence of sacroiliitis.
IMPRESSION: 1. Pancolitis. Given the findings on the prior study and associated
prominent lymph nodes in the mesentery and perirectal space,
ulcerative colitis is favored. Pseudomembranous colitis considered
less likely.
2. No evidence of bowel obstruction, perforation, abscess or
appendicitis.
3. Probable focal fat in the left hepatic lobe.

## 2020-02-17 ENCOUNTER — Inpatient Hospital Stay (HOSPITAL_COMMUNITY)
Admission: EM | Admit: 2020-02-17 | Discharge: 2020-02-18 | DRG: 812 | Disposition: A | Discharge status: Home / Self Care | Payer: Self-pay | Attending: Internal Medicine | Admitting: Internal Medicine

## 2020-02-17 ENCOUNTER — Encounter (HOSPITAL_COMMUNITY): Payer: Self-pay

## 2020-02-17 ENCOUNTER — Other Ambulatory Visit: Payer: Self-pay

## 2020-02-17 DIAGNOSIS — K5190 Ulcerative colitis, unspecified, without complications: Secondary | ICD-10-CM | POA: Diagnosis present

## 2020-02-17 DIAGNOSIS — L508 Other urticaria: Secondary | ICD-10-CM | POA: Diagnosis not present

## 2020-02-17 DIAGNOSIS — Z800 Family history of malignant neoplasm of digestive organs: Secondary | ICD-10-CM

## 2020-02-17 DIAGNOSIS — D75839 Thrombocytosis, unspecified: Secondary | ICD-10-CM

## 2020-02-17 DIAGNOSIS — D75838 Other thrombocytosis: Secondary | ICD-10-CM | POA: Diagnosis present

## 2020-02-17 DIAGNOSIS — T8089XA Other complications following infusion, transfusion and therapeutic injection, initial encounter: Secondary | ICD-10-CM | POA: Diagnosis not present

## 2020-02-17 DIAGNOSIS — D500 Iron deficiency anemia secondary to blood loss (chronic): Principal | ICD-10-CM | POA: Diagnosis present

## 2020-02-17 DIAGNOSIS — Z20822 Contact with and (suspected) exposure to covid-19: Secondary | ICD-10-CM | POA: Diagnosis present

## 2020-02-17 DIAGNOSIS — D649 Anemia, unspecified: Secondary | ICD-10-CM | POA: Diagnosis present

## 2020-02-17 DIAGNOSIS — Y848 Other medical procedures as the cause of abnormal reaction of the patient, or of later complication, without mention of misadventure at the time of the procedure: Secondary | ICD-10-CM | POA: Diagnosis not present

## 2020-02-17 DIAGNOSIS — H02841 Edema of right upper eyelid: Secondary | ICD-10-CM | POA: Diagnosis not present

## 2020-02-17 DIAGNOSIS — N920 Excessive and frequent menstruation with regular cycle: Secondary | ICD-10-CM | POA: Diagnosis present

## 2020-02-17 DIAGNOSIS — Y92239 Unspecified place in hospital as the place of occurrence of the external cause: Secondary | ICD-10-CM | POA: Diagnosis not present

## 2020-02-17 DIAGNOSIS — Z8249 Family history of ischemic heart disease and other diseases of the circulatory system: Secondary | ICD-10-CM

## 2020-02-17 DIAGNOSIS — D509 Iron deficiency anemia, unspecified: Secondary | ICD-10-CM

## 2020-02-17 DIAGNOSIS — H02842 Edema of right lower eyelid: Secondary | ICD-10-CM | POA: Diagnosis not present

## 2020-02-17 HISTORY — DX: Ulcerative colitis, unspecified, without complications: K51.90

## 2020-02-17 LAB — RESPIRATORY PANEL BY RT PCR (FLU A&B, COVID)
Influenza A by PCR: NEGATIVE
Influenza B by PCR: NEGATIVE
SARS Coronavirus 2 by RT PCR: NEGATIVE

## 2020-02-17 LAB — COMPREHENSIVE METABOLIC PANEL
ALT: 9 U/L (ref 0–44)
AST: 13 U/L — ABNORMAL LOW (ref 15–41)
Albumin: 3.2 g/dL — ABNORMAL LOW (ref 3.5–5.0)
Alkaline Phosphatase: 34 U/L — ABNORMAL LOW (ref 38–126)
Anion gap: 6 (ref 5–15)
BUN: 6 mg/dL (ref 6–20)
CO2: 23 mmol/L (ref 22–32)
Calcium: 9.2 mg/dL (ref 8.9–10.3)
Chloride: 108 mmol/L (ref 98–111)
Creatinine, Ser: 0.56 mg/dL (ref 0.44–1.00)
GFR, Estimated: >60 mL/min (ref >60)
Glucose, Bld: 82 mg/dL (ref 70–99)
Potassium: 3.8 mmol/L (ref 3.5–5.1)
Sodium: 137 mmol/L (ref 135–145)
Total Bilirubin: 0.3 mg/dL (ref 0.3–1.2)
Total Protein: 8.1 g/dL (ref 6.5–8.1)

## 2020-02-17 LAB — CBC WITH DIFFERENTIAL/PLATELET
Abs Immature Granulocytes: 0.05 10*3/uL (ref 0.00–0.07)
Basophils Absolute: 0.1 10*3/uL (ref 0.0–0.1)
Basophils Relative: 1 %
Eosinophils Absolute: 1.3 10*3/uL — ABNORMAL HIGH (ref 0.0–0.5)
Eosinophils Relative: 14 %
HCT: 21.7 % — ABNORMAL LOW (ref 36.0–46.0)
Hemoglobin: 4.7 g/dL — CL (ref 12.0–15.0)
Immature Granulocytes: 1 %
Lymphocytes Relative: 20 %
Lymphs Abs: 1.7 10*3/uL (ref 0.7–4.0)
MCH: 13.7 pg — ABNORMAL LOW (ref 26.0–34.0)
MCHC: 21.7 g/dL — ABNORMAL LOW (ref 30.0–36.0)
MCV: 63.1 fL — ABNORMAL LOW (ref 80.0–100.0)
Monocytes Absolute: 1 10*3/uL (ref 0.1–1.0)
Monocytes Relative: 11 %
Neutro Abs: 4.7 10*3/uL (ref 1.7–7.7)
Neutrophils Relative %: 53 %
Platelets: 1756 10*3/uL (ref 150–400)
RBC: 3.44 MIL/uL — ABNORMAL LOW (ref 3.87–5.11)
RDW: 22.3 % — ABNORMAL HIGH (ref 11.5–15.5)
WBC: 8.9 10*3/uL (ref 4.0–10.5)
nRBC: 0.4 % — ABNORMAL HIGH (ref 0.0–0.2)

## 2020-02-17 LAB — FERRITIN: Ferritin: 2 ng/mL — ABNORMAL LOW (ref 11–307)

## 2020-02-17 LAB — IRON AND TIBC
Iron: 14 ug/dL — ABNORMAL LOW (ref 28–170)
Saturation Ratios: 3 % — ABNORMAL LOW (ref 10.4–31.8)
TIBC: 490 ug/dL — ABNORMAL HIGH (ref 250–450)
UIBC: 476 ug/dL

## 2020-02-17 LAB — PREPARE RBC (CROSSMATCH)

## 2020-02-17 LAB — CBC
HCT: 20.1 % — ABNORMAL LOW (ref 36.0–46.0)
Hemoglobin: 4.4 g/dL — CL (ref 12.0–15.0)
MCH: 13.5 pg — ABNORMAL LOW (ref 26.0–34.0)
MCHC: 21.9 g/dL — ABNORMAL LOW (ref 30.0–36.0)
MCV: 61.8 fL — ABNORMAL LOW (ref 80.0–100.0)
Platelets: 1470 10*3/uL (ref 150–400)
RBC: 3.25 MIL/uL — ABNORMAL LOW (ref 3.87–5.11)
RDW: 22.5 % — ABNORMAL HIGH (ref 11.5–15.5)
WBC: 8.5 10*3/uL (ref 4.0–10.5)
nRBC: 0.6 % — ABNORMAL HIGH (ref 0.0–0.2)

## 2020-02-17 LAB — RETICULOCYTES
Immature Retic Fract: 10.5 % (ref 2.3–15.9)
RBC.: 3.41 MIL/uL — ABNORMAL LOW (ref 3.87–5.11)
Retic Count, Absolute: 60 10*3/uL (ref 19.0–186.0)
Retic Ct Pct: 1.8 % (ref 0.4–3.1)

## 2020-02-17 LAB — SAVE SMEAR(SSMR), FOR PROVIDER SLIDE REVIEW

## 2020-02-17 LAB — C-REACTIVE PROTEIN: CRP: <0.5 mg/dL (ref <1.0)

## 2020-02-17 LAB — SEDIMENTATION RATE: Sed Rate: 25 mm/hr — ABNORMAL HIGH (ref 0–22)

## 2020-02-17 LAB — I-STAT BETA HCG BLOOD, ED (MC, WL, AP ONLY): I-stat hCG, quantitative: <5 m[IU]/mL (ref <5)

## 2020-02-17 MED ORDER — ONDANSETRON HCL 4 MG PO TABS: 4.0000 mg | ORAL_TABLET | Freq: Four times a day (QID) | ORAL | Status: DC | PRN

## 2020-02-17 MED ORDER — SODIUM CHLORIDE 0.9% IV SOLUTION: Freq: Once | INTRAVENOUS | Status: DC

## 2020-02-17 MED ORDER — SODIUM CHLORIDE 0.9 % IV SOLN
510.0000 mg | INTRAVENOUS | Status: DC
  Administered 2020-02-17: 510 mg via INTRAVENOUS
  Filled 2020-02-17: qty 17

## 2020-02-17 MED ORDER — SODIUM CHLORIDE 0.9% FLUSH
3.0000 mL | Freq: Two times a day (BID) | INTRAVENOUS | Status: DC
  Administered 2020-02-17 – 2020-02-18 (×3): 3 mL via INTRAVENOUS

## 2020-02-17 MED ORDER — ACETAMINOPHEN 650 MG RE SUPP: 650.0000 mg | Freq: Four times a day (QID) | RECTAL | Status: DC | PRN

## 2020-02-17 MED ORDER — SENNOSIDES-DOCUSATE SODIUM 8.6-50 MG PO TABS: 1.0000 | ORAL_TABLET | Freq: Every evening | ORAL | Status: DC | PRN

## 2020-02-17 MED ORDER — ACETAMINOPHEN 325 MG PO TABS
650.0000 mg | ORAL_TABLET | Freq: Four times a day (QID) | ORAL | Status: DC | PRN
  Administered 2020-02-17: 650 mg via ORAL
  Filled 2020-02-17: qty 2

## 2020-02-17 MED ORDER — ONDANSETRON HCL 4 MG/2ML IJ SOLN: 4.0000 mg | Freq: Four times a day (QID) | INTRAMUSCULAR | Status: DC | PRN

## 2020-02-17 MED ORDER — SODIUM CHLORIDE 0.9% IV SOLUTION: Freq: Once | INTRAVENOUS | Status: AC

## 2020-02-17 NOTE — ED Provider Notes (Signed)
[facility] EMERGENCY DEPARTMENT Provider Note   CSN: [id number] Arrival date & time: 02/17/20  5462     History Chief Complaint  Patient presents with  . Abnormal Lab    Monica Green is a 34 y.o. female with pertinent past medical history of anemia, ulcerative colitis that presents emergency department today sent by [facility] for further evaluation of anemia.  Unable to see these records, however according to patient her hemoglobin was 4.  Per chart review, 3 years ago patient's hemoglobin ranged around 10.5.  No other health records noted.  Patient states that she was living in [place], has not gotten her blood work checked in 3 years.  Patient states that for the past couple of months she has noticed increasing fatigue, was worse over the past couple of days, thought she was dehydrated therefore she went to [facility] who drew her lab work and noted that her hemoglobin was 4.  Patient denies any heavy menstrual bleeding, states that her period was 3 weeks ago and was normal for her.  Denies any rectal bleeding or melena, states that she has not had rectal bleeding for the past 3 years when she was diagnosed with ulcerative colitis.  Is not on any medication for ulcerative colitis right now.  Denies any palpitations, dizziness, shortness of breath, chest pain abdominal pain, nausea, vomiting.  Patient states that she is asymptomatic besides her fatigue which is chronic for her.  Has been eating and drinking normally, has been urinating normally.  Denies any alcohol or drug use.  Denies any leg swelling, calf tenderness, coagulation disorders or cancers in her family.  Denies any fevers or chills.  HPI     Past Medical History:  Diagnosis Date  . Anemia     Patient Active Problem List   Diagnosis Date Noted  . Symptomatic anemia 02/17/2020  . Ulcerative colitis ([place]) 04/08/2015  . Protein-calorie malnutrition, severe 03/22/2015  . Generalized abdominal pain     . Iron deficiency anemia 01/06/2015  . Ureterolithiasis 01/06/2015    Past Surgical History:  Procedure Laterality Date  . FLEXIBLE SIGMOIDOSCOPY N/A 03/23/2015   Procedure: FLEXIBLE SIGMOIDOSCOPY;  Surgeon: Laurence Spates, MD;  Location: [facility];  Service: Endoscopy;  Laterality: N/A;     OB History   No obstetric history on file.     No family history on file.  Social History   Tobacco Use  . Smoking status: Never Smoker  Substance Use Topics  . Alcohol use: Yes  . Drug use: No    Home Medications Prior to Admission medications   Medication Sig Start Date End Date Taking? Authorizing Provider  acetaminophen (TYLENOL) 325 MG tablet Take 325-650 mg by mouth every 6 (six) hours as needed for mild pain (or headaches).   Yes [provider]  feeding supplement (BOOST / RESOURCE BREEZE) LIQD Take 1 Container by mouth 3 (three) times daily between meals. Patient not taking: Reported on 02/17/2020 03/27/15   Katheren Shams, DO  ferrous sulfate (FEOSOL) 325 (65 FE) MG tablet Take 1 tablet (325 mg total) by mouth 2 (two) times daily with a meal. Patient not taking: Reported on 02/17/2020 01/06/15   Bonnielee Haff, MD  oxyCODONE (ROXICODONE) 15 MG immediate release tablet Take 0.5 tablets (7.5 mg total) by mouth every 4 (four) hours as needed. Patient not taking: Reported on 02/17/2020 04/14/15   Katheren Shams, DO  predniSONE (DELTASONE) 10 MG tablet Take 5 tablets (50mg  total) twice  a day with meals. Patient not taking: Reported on 02/17/2020 03/27/15   Katheren Shams, DO  predniSONE (DELTASONE) 10 MG tablet TAKE 5 TABLETS TWICE A DAY WITH MEALS. Patient not taking: Reported on 02/17/2020 04/12/15   Katheren Shams, DO    Allergies    Patient has no known allergies.  Review of Systems   Review of Systems  Constitutional: Positive for fatigue. Negative for chills, diaphoresis and fever.  HENT: Negative for congestion, sore throat and trouble swallowing.   Eyes:  Negative for pain and visual disturbance.  Respiratory: Negative for cough, shortness of breath and wheezing.   Cardiovascular: Negative for chest pain, palpitations and leg swelling.  Gastrointestinal: Negative for abdominal distention, abdominal pain, diarrhea, nausea and vomiting.  Genitourinary: Negative for difficulty urinating.  Musculoskeletal: Negative for back pain, neck pain and neck stiffness.  Skin: Negative for pallor.  Neurological: Negative for dizziness, speech difficulty, weakness and headaches.  Psychiatric/Behavioral: Negative for confusion.    Physical Exam Updated Vital Signs BP 110/68   Pulse 88   Temp 98.3 F (36.8 C)   Resp 15   Ht 5\' 5"  (1.651 m)   Wt 59.4 kg   SpO2 100%   BMI 21.80 kg/m   Physical Exam Constitutional:      General: She is not in acute distress.    Appearance: Normal appearance. She is not ill-appearing, toxic-appearing or diaphoretic.     Comments: Appears well, no acute distress.  HENT:     Mouth/Throat:     Mouth: Mucous membranes are moist.     Pharynx: Oropharynx is clear.  Eyes:     General: No scleral icterus.    Extraocular Movements: Extraocular movements intact.     Pupils: Pupils are equal, round, and reactive to light.  Cardiovascular:     Rate and Rhythm: Normal rate and regular rhythm.     Pulses: Normal pulses.     Heart sounds: Normal heart sounds.  Pulmonary:     Effort: Pulmonary effort is normal. No respiratory distress.     Breath sounds: Normal breath sounds. No stridor. No wheezing, rhonchi or rales.  Chest:     Chest wall: No tenderness.  Abdominal:     General: Abdomen is flat. There is no distension.     Palpations: Abdomen is soft.     Tenderness: There is no abdominal tenderness. There is no guarding or rebound.  Genitourinary:    Comments: Pt rejected DRE. Musculoskeletal:        General: No swelling or tenderness. Normal range of motion.     Cervical back: Normal range of motion and neck  supple. No rigidity.     Right lower leg: No edema.     Left lower leg: No edema.  Skin:    General: Skin is warm and dry.     Capillary Refill: Capillary refill takes less than 2 seconds.     Coloration: Skin is not pale.  Neurological:     General: No focal deficit present.     Mental Status: She is alert and oriented to person, place, and time.  Psychiatric:        Mood and Affect: Mood normal.        Behavior: Behavior normal.     ED Results / Procedures / Treatments   Labs (all labs ordered are listed, but only abnormal results are displayed) Labs Reviewed  COMPREHENSIVE METABOLIC PANEL - Abnormal; Notable for the following components:  Result Value   Albumin 3.2 (*)    AST 13 (*)    Alkaline Phosphatase 34 (*)    All other components within normal limits  CBC - Abnormal; Notable for the following components:   RBC 3.25 (*)    Hemoglobin 4.4 (*)    HCT 20.1 (*)    MCV 61.8 (*)    MCH 13.5 (*)    MCHC 21.9 (*)    RDW 22.5 (*)    Platelets 1,470 (*)    nRBC 0.6 (*)    All other components within normal limits  RETICULOCYTES - Abnormal; Notable for the following components:   RBC. 3.41 (*)    All other components within normal limits  IRON AND TIBC - Abnormal; Notable for the following components:   Iron 14 (*)    TIBC 490 (*)    Saturation Ratios 3 (*)    All other components within normal limits  FERRITIN - Abnormal; Notable for the following components:   Ferritin 2 (*)    All other components within normal limits  CBC WITH DIFFERENTIAL/PLATELET - Abnormal; Notable for the following components:   RBC 3.44 (*)    Hemoglobin 4.7 (*)    HCT 21.7 (*)    MCV 63.1 (*)    MCH 13.7 (*)    MCHC 21.7 (*)    RDW 22.3 (*)    Platelets 1,756 (*)    nRBC 0.4 (*)    Eosinophils Absolute 1.3 (*)    All other components within normal limits  RESPIRATORY PANEL BY RT PCR (FLU A&B, COVID)  SAVE SMEAR (SSMR)  SEDIMENTATION RATE  C-REACTIVE PROTEIN  I-STAT BETA HCG  BLOOD, ED ([facility], [facility], [facility] ONLY)  POC OCCULT BLOOD, ED  TYPE AND SCREEN  PREPARE RBC (CROSSMATCH)  PREPARE RBC (CROSSMATCH)    EKG None  Radiology No results found.  Procedures .Critical Care Performed by: Alfredia Client, PA-C Authorized by: Alfredia Client, PA-C   Critical care provider statement:    Critical care time (minutes):  45   Critical care was time spent personally by me on the following activities:  Discussions with consultants, evaluation of patient's response to treatment, examination of patient, ordering and performing treatments and interventions, ordering and review of laboratory studies, ordering and review of radiographic studies, pulse oximetry, re-evaluation of patient's condition, obtaining history from patient or surrogate and review of old charts   (including critical care time)  Medications Ordered in ED Medications  0.9 %  sodium chloride infusion (Manually program via Guardrails IV Fluids) (0 mLs Intravenous Hold 02/17/20 1518)  0.9 %  sodium chloride infusion (Manually program via Guardrails IV Fluids) (has no administration in time range)  ferumoxytol (FERAHEME) 510 mg in sodium chloride 0.9 % 100 mL IVPB (has no administration in time range)  sodium chloride flush (NS) 0.9 % injection 3 mL (has no administration in time range)  acetaminophen (TYLENOL) tablet 650 mg (has no administration in time range)    Or  acetaminophen (TYLENOL) suppository 650 mg (has no administration in time range)  senna-docusate (Senokot-S) tablet 1 tablet (has no administration in time range)  ondansetron (ZOFRAN) tablet 4 mg (has no administration in time range)    Or  ondansetron (ZOFRAN) injection 4 mg (has no administration in time range)    ED Course  I have reviewed the triage vital signs and the nursing notes.  Pertinent labs & imaging results that were available during my care of the patient were reviewed by  me and considered in my medical decision making (see chart  for details).  Clinical Course as of Feb 17 1547  Tue Feb 17, 2020  1030 Hemoglobin(!!): 4.4 [SP]  1336 Smear Review: SMEAR STAINED AND AVAILABLE FOR REVIEW [SP]    Clinical Course User Index [SP] Alfredia Client, PA-C   MDM Rules/Calculators/A&P                         Monica Green is a 34 y.o. female with pertinent past medical history of anemia, ulcerative colitis that presents emergency department today sent by [facility] for further evaluation of anemia. Patient appears stable, no tachycardia. Patient rejected digital rectal exam today, did discuss risk of this, however patient does not want this at this time.   Hemoglobin 4.4, platelets 1470, in regards to patient's thrombocytosis this could be reactive from anemia, however could also be underlying metaplastic syndrome more leukemia.  Will consult heme-onc wants lab results resolved.  Have tried to contact Dr. Lindi Adie, medical oncology 3-4 times, however have been unsuccessful over the last 3 hours.  Have contacted the secretary who is aware that he needs to contact me.  Will admit to medicine at this time for further evaluation of thrombocytosis with anemia.  We will hold off on transfusion until I have direction from oncology, patient appears very stable.  Patient agreeable for admission. 323 spoke to IM residents for admission, attending Dr. Jimmye Norman who accept the patient.  The patient appears reasonably stabilized for admission considering the current resources, flow, and capabilities available in the ED at this time, and I doubt any other [facility] requiring further screening and/or treatment in the ED prior to admission.   I discussed this case with my attending physician who cosigned this note including patient's presenting symptoms, physical exam, and planned diagnostics and interventions. Attending physician stated agreement with plan or made changes to plan which were implemented.  Final Clinical Impression(s) / ED  Diagnoses Final diagnoses:  Symptomatic anemia  Thrombocytosis    Rx / DC Orders ED Discharge Orders    None       Alfredia Client, PA-C 02/17/20 1550    Charlesetta Shanks, MD 02/22/20 1452

## 2020-02-17 NOTE — Consult Note (Addendum)
[place]  [phone] [phone]    [place]  Referring MD:  Dr. Dorian Pod  Reason for Referral: Anemia and thrombocytosis  HPI: Monica Green is a 34 year old female with a past medical history significant for ulcerative colitis and anemia.  She has required a blood transfusion and IV iron in the past in approximately 2016 for hemoglobin of 4.  The patient was seen at her GI office earlier this week and had lab work done.  Her hemoglobin was low and she was directed to the emergency room for further evaluation and treatment.  CBC on admission showed a WBC of 8.5, hemoglobin 4.4, hematocrit 20.1, MCV 61.8, platelet count 1,470,000.  Reticulocytes are elevated.  Ferritin was low at 2, iron 14, TIBC 490, and percent saturation 3%.  The patient tells me that she has been having some increasing fatigue and dyspnea on exertion recently.  Reports a good appetite and no recent weight loss.  She denies having any chest pain, shortness of breath at rest, dizziness, headaches.  Currently denies abdominal pain, nausea, vomiting.  She reports that she had a 1 day history recently of blood in her stool.  Reports that blood was dark.  She also reported having loose stools that day.  She reports that she was due for colonoscopy in the near future.  Not currently on treatment for ulcerative colitis.  She reports that she has a regular period about once every month.  Last menstrual period about 3 and half weeks ago.  She reports that she bleeds for about 5 days and uses about 20 tampons during that time.  She reports having some clots.  No other bleeding reported.  Denies history of splenectomy. Hematology was asked see the patient to make recommendations regarding her anemia and thrombocytosis.  Past Medical History:  Diagnosis Date  . Anemia   . Ulcerative colitis ([place])   :    Past Surgical History:  Procedure Laterality Date  .  FLEXIBLE SIGMOIDOSCOPY N/A 03/23/2015   Procedure: FLEXIBLE SIGMOIDOSCOPY;  Surgeon: Laurence Spates, MD;  Location: [place];  Service: Endoscopy;  Laterality: N/A;  :   CURRENT MEDS: Current Facility-Administered Medications  Medication Dose Route Frequency Provider Last Rate Last Admin  . 0.9 %  sodium chloride infusion (Manually program via Guardrails IV Fluids)   Intravenous Once Alfredia Client, PA-C   Held at 02/17/20 1518  . 0.9 %  sodium chloride infusion (Manually program via Guardrails IV Fluids)   Intravenous Once Harvie Heck, MD      . acetaminophen (TYLENOL) tablet 650 mg  650 mg Oral Q6H PRN Aslam, Loralyn Fresh[place], MD       Or  . acetaminophen (TYLENOL) suppository 650 mg  650 mg Rectal Q6H PRN Aslam, Sadia, MD      . ferumoxytol (FERAHEME) 510 mg in sodium chloride 0.9 % 100 mL IVPB  510 mg Intravenous Weekly Aslam, Sadia, MD      . ondansetron (ZOFRAN) tablet 4 mg  4 mg Oral Q6H PRN Aslam, Sadia, MD       Or  . ondansetron (ZOFRAN) injection 4 mg  4 mg Intravenous Q6H PRN Aslam, Sadia, MD      . senna-docusate (Senokot-S) tablet 1 tablet  1 tablet Oral QHS PRN Aslam, Sadia, MD      . sodium chloride flush (NS) 0.9 % injection 3 mL  3 mL Intravenous Q12H Harvie Heck, MD       Current Outpatient Medications  Medication Sig Dispense Refill  . acetaminophen (TYLENOL) 325 MG tablet Take 325-650 mg by mouth every 6 (six) hours as needed for mild pain (or headaches).    . feeding supplement (BOOST / RESOURCE BREEZE) LIQD Take 1 Container by mouth 3 (three) times daily between meals. (Patient not taking: Reported on 02/17/2020) 237 mL 1  . ferrous sulfate (FEOSOL) 325 (65 FE) MG tablet Take 1 tablet (325 mg total) by mouth 2 (two) times daily with a meal. (Patient not taking: Reported on 02/17/2020) 60 tablet 3  . oxyCODONE (ROXICODONE) 15 MG immediate release tablet Take 0.5 tablets (7.5 mg total) by mouth every 4 (four) hours as needed. (Patient not taking: Reported on 02/17/2020) 30  tablet 0  . predniSONE (DELTASONE) 10 MG tablet Take 5 tablets (104m total) twice a day with meals. (Patient not taking: Reported on 02/17/2020) 180 tablet 0  . predniSONE (DELTASONE) 10 MG tablet TAKE 5 TABLETS TWICE A DAY WITH MEALS. (Patient not taking: Reported on 02/17/2020) 180 tablet 1      No Known Allergies:  Family History  Problem Relation Age of Onset  . Colon cancer Father   :  Social History   Socioeconomic History  . Marital status: Single    Spouse name: Not on file  . Number of children: 0  . Years of education: Not on file  . Highest education level: Not on file  Occupational History  . Not on file  Tobacco Use  . Smoking status: Never Smoker  . Smokeless tobacco: Never Used  Substance and Sexual Activity  . Alcohol use: Yes    Comment: Social  . Drug use: No  . Sexual activity: Not on file  Other Topics Concern  . Not on file  Social History Narrative  . Not on file   Social Determinants of Health   Financial Resource Strain:   . Difficulty of Paying Living Expenses: Not on file  Food Insecurity:   . Worried About RCharity fundraiserin the Last Year: Not on file  . Ran Out of Food in the Last Year: Not on file  Transportation Needs:   . Lack of Transportation (Medical): Not on file  . Lack of Transportation (Non-Medical): Not on file  Physical Activity:   . Days of Exercise per Week: Not on file  . Minutes of Exercise per Session: Not on file  Stress:   . Feeling of Stress : Not on file  Social Connections:   . Frequency of Communication with Friends and Family: Not on file  . Frequency of Social Gatherings with Friends and Family: Not on file  . Attends Religious Services: Not on file  . Active Member of Clubs or Organizations: Not on file  . Attends CArchivistMeetings: Not on file  . Marital Status: Not on file  Intimate Partner Violence:   . Fear of Current or Ex-Partner: Not on file  . Emotionally Abused: Not on file  .  Physically Abused: Not on file  . Sexually Abused: Not on file  :  REVIEW OF SYSTEMS:  A comprehensive 14 point review of systems was negative except as noted in the HPI.    Exam: Patient Vitals for the past 24 hrs:  BP Temp Temp src Pulse Resp SpO2 Height Weight  02/17/20 1600 106/67 -- -- -- 18 -- -- --  02/17/20 1545 108/64 -- -- -- 19 -- -- --  02/17/20 1530 110/68 -- -- 88 15 100 % -- --  02/17/20 1515 114/69 -- -- -- 16 -- -- --  02/17/20 1500 108/66 -- -- 86 19 100 % -- --  02/17/20 1445 113/67 -- -- -- 19 -- -- --  02/17/20 1430 108/64 -- -- -- 14 -- -- --  02/17/20 1415 106/70 -- -- -- 15 -- -- --  02/17/20 1400 105/67 -- -- -- 17 -- -- --  02/17/20 1345 109/67 -- -- -- 14 -- -- --  02/17/20 1330 101/66 -- -- -- 17 -- -- --  02/17/20 1315 106/69 -- -- -- 16 -- -- --  02/17/20 1300 110/63 -- -- -- 14 -- -- --  02/17/20 1245 111/64 -- -- 86 14 100 % -- --  02/17/20 1230 106/68 -- -- 85 15 (!) 85 % -- --  02/17/20 1215 107/66 -- -- 75 17 100 % -- --  02/17/20 1200 104/64 -- -- 84 16 100 % -- --  02/17/20 1100 108/71 -- -- 85 (!) 21 100 % -- --  02/17/20 1045 114/71 -- -- 86 18 98 % -- --  02/17/20 1043 114/67 98.3 F (36.8 C) -- 94 16 100 % -- --  02/17/20 1040 -- -- -- 83 14 100 % -- --  02/17/20 1039 114/67 -- -- -- -- -- -- --  02/17/20 0931 -- -- -- -- -- -- _0  (1.651 m) 59.4 kg  02/17/20 0928 114/73 98.3 F (36.8 C) Oral 90 18 100 % -- --    General:  well-nourished in no acute distress.   Eyes:  no scleral icterus.   ENT:  There were no oropharyngeal lesions.     Lymphatics:  Negative cervical, supraclavicular or axillary adenopathy.   Respiratory: lungs were clear bilaterally without wheezing or crackles.   Cardiovascular:  Regular rate and rhythm, S1/S2, without murmur, rub or gallop.  There was no pedal edema.   GI:  abdomen was soft, flat, nontender, nondistended, without organomegaly.   Musculoskeletal:  no spinal tenderness of palpation of vertebral  spine.   Skin exam was without ecchymosis, petechiae.   Neuro exam was nonfocal.  Patient was alert and oriented.  Attention was good.   Language was appropriate.  Mood was normal without depression.  Speech was not pressured.  Thought content was not tangential.    LABS:  Lab Results  Component Value Date   WBC 8.9 02/17/2020   HGB 4.7 (LL) 02/17/2020   HCT 21.7 (L) 02/17/2020   PLT 1,756 (HH) 02/17/2020   GLUCOSE 82 02/17/2020   ALT 9 02/17/2020   AST 13 (L) 02/17/2020   NA 137 02/17/2020   K 3.8 02/17/2020   CL 108 02/17/2020   CREATININE 0.56 02/17/2020   BUN 6 02/17/2020   CO2 23 02/17/2020    No results found.   ASSESSMENT AND PLAN:  1.  Microcytic anemia secondary to iron deficiency 2.  Thrombocytosis, ?Reactive 3.  Ulcerative colitis  -Lab work has been discussed with the patient and the primary team today.  The patient has evidence of iron deficiency on her lab work.  Agree with Feraheme 510 mg IV x2 doses 1 week apart.  1st dose today.  3 units PRBCs have been ordered.  Monitor CBC daily. -Thrombocytosis likely reactive due to iron deficiency.  We will continue to monitor platelet count to see if it trends downward.  We will also send off for JAK2 mutation.  If platelet count not improving, may need to consider proceeding with bone marrow biopsy. -Recommend GI  consult for evaluation of ulcerative colitis.  Thank you for this referral.  Mikey Bussing, DNP, AGPCNP-BC, AOCNP Mon/Tues/Thurs/Fri 7am-5pm; Off Wednesdays Cell: [phone]  Attending Note  I personally saw the patient, reviewed the chart and examined the patient. The plan of care was discussed with the patient and the admitting team. I agree with the assessment and plan as documented above. Thank you very much for the consultation. 1.  Severe iron deficiency anemia due to chronic blood loss either from GI or vaginal source.  I agree with her blood transfusion and iron infusions.  She will need a total  of 1000 mg of IV iron infusion either with Venofer or Feraheme. 2. thrombocytosis: Reactive versus myeloproliferative etiologies: JAK2 mutation has been sent.  I anticipate that the platelet counts will slowly trend downwards as we fix her iron deficiency.  She will need to be followed by [place] in the outpatient clinic every 3 months with iron studies and to determine if she will need additional IV iron treatments.

## 2020-02-17 NOTE — H&P (Signed)
Date: 02/17/2020               Patient Name:  Monica Green MRN: [id number]  DOB: 10-Dec-1985 Age / Sex: 34 y.o., female   PCP: Patient, No Pcp Per         Medical Service: Internal Medicine Teaching Service         Attending Physician: Dr. Jimmye Norman, Elaina Pattee, MD    First Contact: Dr. Demaris Callander Pager: [phone]  Second Contact: Dr. Marva Panda Pager: [phone]       After Hours (After 5p/  First Contact Pager: [phone]  weekends / holidays): Second Contact Pager: [phone]   Chief Complaint: Abnormal lab work  History of Present Illness: Monica Green is 34 yo F with PMH of anemia, Ulcerative Colitis   who presents to [facility] after a visit with [facility] for further evaluation of anemia/ abnormal lab work. She was in her usual state of health until couple of days ago she felt dehydrated, noted dark red blod with stools (runny stools) for 1 day, felt like she had to go to GI doctor.She denies any nausea, vomiting, change in appetite or change in stool frequency or abdomianl pain. She endorses fatigue at work but considers it as her baseline. Also, notes dyspnea on overexertion " Taking long walk". In 2016, she presented with renal stone and was noted to be significantly anemic with Hb of 4-was diagnosed with UC but does not take any medication for it but uses bone broth to prevent any flare-up. In 2017, she moved to [place] and came back to [place] approximately 2 years ago. She has menorrhagia,  goes through box of 20 tampons in 5 days of periods, LMP 3.5 weeks ago. No fever, no chills, no cough.    Meds:  . sodium chloride   Intravenous Once  . sodium chloride flush  3 mL Intravenous Q12H   Meds ordered this encounter  Medications  . 0.9 %  sodium chloride infusion (Manually program via Guardrails IV Fluids)  . 0.9 %  sodium chloride infusion (Manually program via Guardrails IV Fluids)  . ferumoxytol (FERAHEME) 510 mg in sodium chloride 0.9 % 100 mL IVPB  . sodium chloride  flush (NS) 0.9 % injection 3 mL  . OR Linked Order Group   . acetaminophen (TYLENOL) tablet 650 mg   . acetaminophen (TYLENOL) suppository 650 mg  . senna-docusate (Senokot-S) tablet 1 tablet  . OR Linked Order Group   . ondansetron (ZOFRAN) tablet 4 mg   . ondansetron (ZOFRAN) injection 4 mg    Current Meds  Medication Sig  . acetaminophen (TYLENOL) 325 MG tablet Take 325-650 mg by mouth every 6 (six) hours as needed for mild pain (or headaches).     Allergies: Allergies as of 02/17/2020  . (No Known Allergies)   Past Medical History:  Diagnosis Date  . Anemia   . 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  Denies any cigarette use; social drinker (last drink was 2-3 weeks ago) Denies illicit drug use   Review of Systems: A complete ROS was negative except as per HPI.   Physical Exam: Blood pressure 113/89, pulse 82, temperature 98.9 F (37.2 C), temperature source Oral, resp. rate 15, height _0  (1.651 m),  weight 131 lb (59.4 kg), SpO2 100 %.  Physical Exam Constitutional:      Appearance: Normal appearance.  HENT:     Head: Normocephalic and atraumatic.     Nose: Nose normal.     Mouth/Throat:     Mouth: Mucous membranes are moist.  Eyes:     Pupils: Pupils are equal, round, and reactive to light.     Comments: Conjunctival pallor  Cardiovascular:     Rate and Rhythm: Normal rate and regular rhythm.  Pulmonary:     Effort: Pulmonary effort is normal.     Breath sounds: Normal breath sounds.  Abdominal:     General: Abdomen is flat. Bowel sounds are normal.  Musculoskeletal:        General: Normal range of motion.     Cervical back: Normal range of motion and neck supple.  Neurological:     Mental Status: She is alert and oriented to person,  place, and time.  Psychiatric:        Mood and Affect: Mood normal.        Behavior: Behavior normal.        Thought Content: Thought content normal.        Judgment: Judgment normal.      EKG: personally reviewed my interpretation is normal sinus rhythm.   CXR: None  Assessment & Plan by Problem: Active Problems:   Symptomatic anemia Monica Green is 34 yo F with PMH of anemia, Ulcerative Colitis   who presents to [facility] after a visit with [facility] for further evaluation of anemia.   # Iron Deficiency Anemia Patient seen at her GI office earlier this week and had lab work done.Her Hb was low and she was directed to the ED for further evaluation and treatment. CBC on admission showed a WBC of 8.5, Hb 4.4, hematocrit 20.1, MCV 61.8, platelet count 1,470,000.  Reticulocytes are elevated.  Ferritin was low at 2, iron 14, TIBC 490, and percent saturation 3%. She required a blood transfusion and IV iron in 2016 for hemoglobin of 4. As she is presenting with increasing fatigue & dyspnea on exertion, has menorrhagia, 1 episode of recent blood in her stool, has Hb of 4.4- she needs acute blood transfusion. Considering menorrhagia as a source of her iron deficiency, will consider advising her about starting OCP's.  -- Feraheme 510 mg IV x2 doses 1 week apart ( ist dose today) -- Follow up CBC -- 3 units of PRBCs  # Thrombocytosis Her platelets are 1756 today likely due to reactive iron deficiency. 533 in 1/17, 593 in 12/16. We will continue to monitor platelet count.  --Follow up Platelet count --JAK2 mutation --May consider Bone marrow biopsy  # Ulcerative Colitis She was diagnosed with Ulcerative colitis in 2016. CT abdomen performed and positive for pancolitis. Colonoscopy revealed severe pancolitis and pathology results were consistent with ulcerative colitis. She had 1 episode of bloody stool 3 weeks ago. Denies any current active symptoms. - Closely observe  - May consider GI  consult, if becomes symptomatic   Dispo: Admit patient to Inpatient with expected length of stay greater than 2 midnights.  Signed: Honor Junes, MD 02/17/2020, 7:09 PM  Pager: [phone] After 5pm on weekdays and 1pm on weekends: On Call pager: [phone]

## 2020-02-17 NOTE — ED Triage Notes (Signed)
Pt sent by [facility] for further evaluation of anemia. Pt had labs drawn yesterday with a hgb of 4. Pt has hx of ulcerative colitis but has not noticed any heavy blood loss. Pt reports feeling her normal. Pt a.o

## 2020-02-17 NOTE — ED Notes (Signed)
Date and time results received: 02/17/20  Test: HGB , PLT Critical Value: hgb 4.4, [id number]  Name of Provider Notified: Posey Pronto PA

## 2020-02-18 ENCOUNTER — Telehealth: Payer: Self-pay

## 2020-02-18 ENCOUNTER — Other Ambulatory Visit: Payer: Self-pay | Admitting: Internal Medicine

## 2020-02-18 DIAGNOSIS — D500 Iron deficiency anemia secondary to blood loss (chronic): Secondary | ICD-10-CM

## 2020-02-18 LAB — COMPREHENSIVE METABOLIC PANEL
ALT: 7 U/L (ref 0–44)
AST: 12 U/L — ABNORMAL LOW (ref 15–41)
Albumin: 3.2 g/dL — ABNORMAL LOW (ref 3.5–5.0)
Alkaline Phosphatase: 35 U/L — ABNORMAL LOW (ref 38–126)
Anion gap: 8 (ref 5–15)
BUN: <5 mg/dL — ABNORMAL LOW (ref 6–20)
CO2: 20 mmol/L — ABNORMAL LOW (ref 22–32)
Calcium: 9.2 mg/dL (ref 8.9–10.3)
Chloride: 111 mmol/L (ref 98–111)
Creatinine, Ser: 0.6 mg/dL (ref 0.44–1.00)
GFR, Estimated: >60 mL/min (ref >60)
Glucose, Bld: 84 mg/dL (ref 70–99)
Potassium: 3.5 mmol/L (ref 3.5–5.1)
Sodium: 139 mmol/L (ref 135–145)
Total Bilirubin: 1.6 mg/dL — ABNORMAL HIGH (ref 0.3–1.2)
Total Protein: 7.8 g/dL (ref 6.5–8.1)

## 2020-02-18 LAB — TYPE AND SCREEN
ABO/RH(D): B POS
Antibody Screen: NEGATIVE
Unit division: 0
Unit division: 0
Unit division: 0

## 2020-02-18 LAB — BPAM RBC
Blood Product Expiration Date: 202111232359
Blood Product Expiration Date: 202112092359
Blood Product Expiration Date: 202112092359
ISSUE DATE / TIME: 202111161637
ISSUE DATE / TIME: 202111161907
ISSUE DATE / TIME: 202111162221
Unit Type and Rh: 1700
Unit Type and Rh: 7300
Unit Type and Rh: 7300

## 2020-02-18 LAB — CBC WITH DIFFERENTIAL/PLATELET
Abs Immature Granulocytes: 0.04 10*3/uL (ref 0.00–0.07)
Basophils Absolute: 0.1 10*3/uL (ref 0.0–0.1)
Basophils Relative: 1 %
Eosinophils Absolute: 1.2 10*3/uL — ABNORMAL HIGH (ref 0.0–0.5)
Eosinophils Relative: 12 %
HCT: 32.5 % — ABNORMAL LOW (ref 36.0–46.0)
Hemoglobin: 9.3 g/dL — ABNORMAL LOW (ref 12.0–15.0)
Immature Granulocytes: 0 %
Lymphocytes Relative: 14 %
Lymphs Abs: 1.4 10*3/uL (ref 0.7–4.0)
MCH: 19.9 pg — ABNORMAL LOW (ref 26.0–34.0)
MCHC: 28.6 g/dL — ABNORMAL LOW (ref 30.0–36.0)
MCV: 69.4 fL — ABNORMAL LOW (ref 80.0–100.0)
Monocytes Absolute: 1.2 10*3/uL — ABNORMAL HIGH (ref 0.1–1.0)
Monocytes Relative: 12 %
Neutro Abs: 6.2 10*3/uL (ref 1.7–7.7)
Neutrophils Relative %: 61 %
Platelets: 1181 10*3/uL (ref 150–400)
RBC: 4.68 MIL/uL (ref 3.87–5.11)
RDW: 27.8 % — ABNORMAL HIGH (ref 11.5–15.5)
WBC: 10.1 10*3/uL (ref 4.0–10.5)
nRBC: 0.9 % — ABNORMAL HIGH (ref 0.0–0.2)

## 2020-02-18 LAB — GLUCOSE, CAPILLARY: Glucose-Capillary: 83 mg/dL (ref 70–99)

## 2020-02-18 LAB — HIV ANTIBODY (ROUTINE TESTING W REFLEX): HIV Screen 4th Generation wRfx: NONREACTIVE

## 2020-02-18 LAB — APTT: aPTT: 44 seconds — ABNORMAL HIGH (ref 24–36)

## 2020-02-18 LAB — PROTIME-INR
INR: 1.2 (ref 0.8–1.2)
Prothrombin Time: 14.3 seconds (ref 11.4–15.2)

## 2020-02-18 LAB — PATHOLOGIST SMEAR REVIEW

## 2020-02-18 MED ORDER — ARTIFICIAL TEARS OPHTHALMIC OINT
TOPICAL_OINTMENT | OPHTHALMIC | Status: DC | PRN
  Filled 2020-02-18: qty 3.5

## 2020-02-18 MED ORDER — DIPHENHYDRAMINE HCL 25 MG PO CAPS
25.0000 mg | ORAL_CAPSULE | Freq: Once | ORAL | Status: AC
  Administered 2020-02-18: 25 mg via ORAL
  Filled 2020-02-18: qty 1

## 2020-02-18 MED ORDER — DIPHENHYDRAMINE HCL 50 MG/ML IJ SOLN
50.0000 mg | Freq: Once | INTRAMUSCULAR | Status: AC
  Administered 2020-02-18: 50 mg via INTRAVENOUS
  Filled 2020-02-18: qty 1

## 2020-02-18 MED ORDER — POLYSACCHARIDE IRON COMPLEX 150 MG PO CAPS: 150.0000 mg | ORAL_CAPSULE | Freq: Every day | ORAL | 0 refills | Status: DC

## 2020-02-18 MED ORDER — DIPHENHYDRAMINE HCL 25 MG PO CAPS: 25.0000 mg | ORAL_CAPSULE | Freq: Once | ORAL | Status: DC

## 2020-02-18 NOTE — Progress Notes (Signed)
MD ordered artificial tears and benadryl for eyelid itching. Pt was given both and after a few minutes her upper eyelid started swelling. Pt now states her ears and throat are itching. Blood transfusion stopped. Paged MD.

## 2020-02-18 NOTE — Discharge Instructions (Signed)

## 2020-02-18 NOTE — Hospital Course (Signed)
Presented with Feraheme 510 mg IV x2 doses 1 week apart ( ist dose today) -- Follow up CBC -- 3 units of PRBCs  urticarial transfusion reaction s/p 25 mg PO diphenhydramine - 50 mg IV diphenhydramine

## 2020-02-18 NOTE — Progress Notes (Signed)
Transfusion reaction symptoms continue to resolve, VSS at this time. Pt denied any other distress. She voices that benadryl does relieves symptoms and makes her feels woozy, will ambulate patient around noon.

## 2020-02-18 NOTE — Progress Notes (Signed)
Patient tolerated regular die. Continue to denies any distress or pain. Discharge instructions reviewed with patient/family. All questions answered at this time. Transport home by family.

## 2020-02-18 NOTE — Progress Notes (Signed)
   Subjective: Monica Green Patient evaluated at bedside this AM. States she feels fine this morning, except her eyes are puffy. Mentions her itching from the transfusion reaction has resolved. Reports no previous blood transfusion reaction. She did mention one previous reaction which resulted in minor arm itching. Discussed improvement of blood counts this morning. She says she feels ready to go home. Denies dizziness, dyspnea, pain. Encouraged patient to return in one week for iron infusion. She says she has an appointment with gastroenterology. She is open to IMTS clinic for PCP. Discussed use of OCPs for heavy periods, although patient would not like to start that at this time. Discussed starting iron supplements after next week's infusion and PCP appointment.   Objective:  Vital signs in last 24 hours: Vitals:   02/18/20 0130 02/18/20 0301 02/18/20 0729 02/18/20 1015  BP: 130/76 115/72 94/66 114/83  Pulse: 83 (!) 59 70   Resp: _0 Temp: 98.3 F (36.8 C) 98.1 F (36.7 C) 98.4 F (36.9 C)   TempSrc: Oral Oral Oral   SpO2:   100%   Weight:      Height:       Physical exam: General: Well developed, pleasant young female lying comfortably in bed. HEENT: Bilateral periorbital edema Neuro: AAO*3 Psychiatric: Pleasant mood and affect. Normal judgement, normal thought content.  Assessment/Plan:  Active Problems:   Symptomatic anemia  # Iron Deficiency Anemia Patient's Hb is 9.3 this morning. During 3rd PRBC's transfusion, she had an urticarial reaction, BL Periorbital edema, itching which got improved with 25 mg PO diphenhydramine & 50 mg IV diphenhydramine. This morning she still has periorbital edema but its improving and was able to keep her eyes open. Considering menorrhagia as a source of her iron deficiency, advised her about starting OCP's- not open at this point but is going to think about it.  -- Feraheme 510 mg IV 1 dose in 1 week   # Thrombocytosis Her platelets are  1181 today likely due to reactive iron deficiency. Were 1756 yesterday,533 in 1/17, 593 in 12/16. We will continue to monitor platelet count.   -- Follow up JAK2 mutation --May consider Bone marrow biopsy  # Ulcerative Colitis No acute symptoms this morning. She was diagnosed with Ulcerative colitis in 2016. CT abdomen performed and positive for pancolitis. Colonoscopy revealed severe pancolitis and pathology results were consistent with ulcerative colitis. She had 1 episode of bloody stool 3 weeks ago.   - Closely observe  - May consider GI consult, if becomes symptomatic  Prior to Admission Living Arrangement: Home Anticipated Discharge Location: Home Barriers to Discharge: None Dispo: Anticipated discharge in approximately 0 day(s).   Honor Junes, MD 02/18/2020, 11:11 AM Pager: _1 @ After 5pm on weekdays and 1pm on weekends: On Call pager [phone]

## 2020-02-18 NOTE — Progress Notes (Addendum)
Subjective:  Monica Green is 34 yo F with PMH of anemia, Ulcerative Colitis who presented to [facility] yesterday (02/18/20) after a visit with [facility] for further evaluation of anemia.   Paged by RN [phone], reporting patient with R lower eyelid swelling and reporting bilateral eye and ear itching during transfusion of 3rd unit of pRBCs. Vitals stable. No abdominal pain, N/V, difficulty breathing. Benadryl 25 mg PO administered.  Received subsequent page ~0120, patient now with R upper eyelid swelling and pruritis of throat. Blood transfusion paused 0125. Vitals stable. No abdominal pain, N/V, difficulty breathing.  Received 3rd page 0140, patient with L upper and lower eyelid swelling and report of "bumps on forehead." Patient evaluated at bedside. States she feels itchy over her entire face, ears, throat, and chest. Denies abdominal pain, N/V, difficulty breathing. States she has never had a reaction like this before.  Objective:  T 98.3 P 83 BP 130/76 General: patient is pleasant and conversant HENT: bilateral upper and lower eyelid swelling Abdominal: flat, non-tender, non-distended Skin: quarter-sized plaque in middle of forehead, none on neck, chest, arms, or legs   Assessment/Plan:   Patient with acute onset of pruritis localized to face and chest, bilateral periorbital edema, and localized urticarial rash during administration of 3rd of 3 units of pRBCs. Patient is afebrile with stable vitals. Suspect urticarial transfusion reaction. Life-threatening transfusion reactions such as anaphylactic transfusion TACO, AHTR, TRALI less likely given stable vitals.   - 3rd unit pRBC transfusion stopped 0125 - s/p 25 mg PO diphenhydramine - 50 mg IV diphenhydramine - repeat CBC 2 hours post-transfusion (~0325)

## 2020-02-18 NOTE — Progress Notes (Signed)
Eye edema appears less swollen and pt voices she feels a little better. Pt requests to have benadryl at a later time after speaking with MDs.

## 2020-02-18 NOTE — Telephone Encounter (Signed)
NHFU per [facility] est care; pt appt 03/01/2020 1015am/nw

## 2020-02-18 NOTE — Progress Notes (Signed)
Pt c/o swelling and itching on lower right eyelid. Pt states this started just a few minutes ago. Pt is currently receiving third unit of blood. Pt took her contacts out once these symptoms started. Called MD to inform of pt's sx's and to advise if blood transfusion should continue or be stopped. Waiting on further instructions from MD.

## 2020-02-18 NOTE — Progress Notes (Signed)
Encounter to set up outpatient IV iron infusion for second weekly dose of Feraheme

## 2020-02-18 NOTE — Progress Notes (Signed)
   02/18/20 1301  Mobility  Activity Ambulated in hall;Ambulated in room  Range of Motion/Exercises Active;All extremities  Level of Assistance Standby assist, set-up cues, supervision of patient - no hands on  Assistive Device None  Distance Ambulated (ft) 200 ft  Mobility Response Tolerated fair  Mobility performed by Nurse    Patient denied SOB or dizziness during ambulation at this time. Request for regular diet. MD paged.

## 2020-02-18 NOTE — Discharge Summary (Signed)
Name: Monica Green MRN: [id number] DOB: 12-Feb-1986 34 y.o. PCP: Patient, No Pcp Per  Date of Admission: 02/17/2020  9:25 AM Date of Discharge: 02/18/2020 Attending Physician: Dorian Pod, MD  Discharge Diagnosis: 1. Symptomatic iron deficiency anemia 2. Reactive thrombocytosis 3. Ulcerative Colitis  Discharge Medications: Allergies as of 02/18/2020   No Known Allergies     Medication List    STOP taking these medications   feeding supplement Liqd   ferrous sulfate 325 (65 FE) MG tablet Commonly known as: Feosol   oxyCODONE 15 MG immediate release tablet Commonly known as: ROXICODONE   predniSONE 10 MG tablet Commonly known as: DELTASONE     TAKE these medications   acetaminophen 325 MG tablet Commonly known as: TYLENOL Take 325-650 mg by mouth every 6 (six) hours as needed for mild pain (or headaches).   iron polysaccharides 150 MG capsule Commonly known as: NIFEREX Take 1 capsule (150 mg total) by mouth daily.       Disposition and follow-up:   [name] was discharged from [facility] in Stable condition.  At the hospital follow up visit please address:  1.  Symptomatic Iron deficiency anemia - Check cbc - Ensure she follow up for second iron infusion - Ensure she continues oral iron supplementation - Ensure she follows up with GI - Consider starting OCP for menorrhagia  2. Reactive thrombocytosis - F/u JAK2 genotype and blood smear - Check cbc  3. Ulcerative Colitis - Ensure she follows up with GI  2.  Labs / imaging needed at time of follow-up: cbc, bmp  3.  Pending labs/ test needing follow-up: JAK2, Blood smear  Follow-up Appointments:  Follow-up Information    Laurence Spates, MD. Schedule an appointment as soon as possible for a visit in 1 week(s).   Specialty: Gastroenterology Why: Please call Dr. Oletta Lamas and make an appointment within the next week! Contact information: [address] [address] [phone]        [facility] Follow up.   Specialty: Internal Medicine Contact information: [address] [id number] mc [place]       Jose Persia, MD. Go on 03/01/2020.   Specialty: Internal Medicine Why: @10 :15 am Contact information: [address] [address] [place] Follow up on 02/24/2020.   Why: For IV iron infusion at 1 PM Contact information: [address] [id number] [place] [facility] Course by problem list: 1. Symptomatic iron deficiency anemia: Monica Green is a 34 yo F w/ PMH of ulcerative colitis presenting to [facility] after being told that she has anemia. On admission noted to have hemoglobin of 4.4 with ferritin of 2. She was treated with 3 units of packed red blood cells and IV iron infusion. Post-transfusion hemoglobin was noted to be 9.3 without evidence of significant active bleed on exam. She was discharged with recommendation to continue with iron supplementation and follow up with GI.  2. Reactive thrombocytosis: On admission noted to have platelet count of 1,470. Thought to be reactive thrombocytosis in response to severe anemia. Hematology was consulted who ordered JAK2 genotype testing to rule out essential thrombocytosis. Advised to follow up in outpatient setting. At discharge, noted to have improvement down to 1181.  3. Ulcerative Colitis:  Noted to have history of UC, currently not on any treatment. No abdominal pain or diarrhea during admission to suggest active flare. Discharged with recommendation to follow up with outpatient GI.  Discharge Vitals:   BP 112/76 (BP Location: Left Arm)   Pulse 73   Temp 98.4 F (36.9 C) (Oral)   Resp 16   Ht 5\' 5"  (1.651 m)   Wt 59.4 kg   SpO2 100%   BMI  21.80 kg/m   Pertinent Labs, Studies, and Procedures:  CBC Latest Ref Rng & Units 02/18/2020 02/17/2020 02/17/2020  WBC 4.0 - 10.5 K/uL 10.1 8.9 8.5  Hemoglobin 12.0 - 15.0 g/dL 9.3(L) 4.7(LL) 4.4(LL)  Hematocrit 36 - 46 % 32.5(L) 21.7(L) 20.1(L)  Platelets 150 - 400 K/uL 1,181(HH) 1,756(HH) 1,470(HH)   BMP Latest Ref Rng & Units 02/18/2020 02/17/2020 04/07/2015  Glucose 70 - 99 mg/dL 84 82 98  BUN 6 - 20 mg/dL <5(L) 6 8  Creatinine 0.44 - 1.00 mg/dL 0.60 0.56 0.43(L)  Sodium 135 - 145 mmol/L 139 137 137  Potassium 3.5 - 5.1 mmol/L 3.5 3.8 3.6  Chloride 98 - 111 mmol/L 111 108 100  CO2 22 - 32 mmol/L 20(L) 23 27  Calcium 8.9 - 10.3 mg/dL 9.2 9.2 8.6   Iron/TIBC/Ferritin/ %Sat    Component Value Date/Time   IRON 14 (L) 02/17/2020 1120   TIBC 490 (H) 02/17/2020 1120   FERRITIN 2 (L) 02/17/2020 1120   IRONPCTSAT 3 (L) 02/17/2020 1120   Discharge Instructions: Discharge Instructions    Call MD for:  difficulty breathing, headache or visual disturbances   Complete by: As directed    Call MD for:  extreme fatigue   Complete by: As directed    Call MD for:  persistant dizziness or light-headedness   Complete by: As directed    Call MD for:  persistant nausea and vomiting   Complete by: As directed    Call MD for:  redness, tenderness, or signs of infection (pain, swelling, redness, odor or green/yellow discharge around incision site)   Complete by: As directed    Call MD for:  temperature >100.4   Complete by: As directed    Diet - low sodium heart healthy   Complete by: As directed    Discharge instructions   Complete by: As directed    [name]  You came to [place] with fatigue. We have determined this was caused by iron deficiency anemia. Here are our recommendations for you at discharge:  Please return to hospital short stay unit next Tuesday at 1pm for your second iron infusion Make sure to pick up your iron supplements and continue Follow with our  internal medicine clinic to establish care and discuss treatment for your vaginal bleeding. Make sure to follow up with your gastroenterologist for treatment of your UC.  Thank you for choosing [facility] [place]   Increase activity slowly   Complete by: As directed      Signed: Mosetta Anis, MD 02/18/2020, 5:35 PM Pager: [phone] After 5pm on weekdays and 1pm on weekends: On Call Pager: [phone]

## 2020-02-20 NOTE — Discharge Instructions (Signed)

## 2020-02-24 ENCOUNTER — Inpatient Hospital Stay (HOSPITAL_COMMUNITY)
Admit: 2020-02-24 | Discharge: 2020-02-24 | Disposition: A | Discharge status: Home / Self Care | Payer: Self-pay | Attending: Internal Medicine | Admitting: Internal Medicine

## 2020-02-29 LAB — JAK2 GENOTYPR

## 2020-03-01 ENCOUNTER — Encounter: Payer: Self-pay | Admitting: Internal Medicine

## 2020-03-02 ENCOUNTER — Other Ambulatory Visit: Payer: Self-pay | Admitting: Oncology

## 2020-03-02 ENCOUNTER — Telehealth: Payer: Self-pay | Admitting: Hematology and Oncology

## 2020-03-02 DIAGNOSIS — D508 Other iron deficiency anemias: Secondary | ICD-10-CM

## 2020-03-02 NOTE — Telephone Encounter (Signed)
Scheduled appt per 11/30 sch msg - left message for patient with appt date and time

## 2020-03-03 ENCOUNTER — Ambulatory Visit (HOSPITAL_COMMUNITY)
Admission: RE | Admit: 2020-03-03 | Discharge: 2020-03-03 | Disposition: A | Discharge status: Home / Self Care | Payer: Medicaid Other | Source: Ambulatory Visit | Attending: Internal Medicine | Admitting: Internal Medicine

## 2020-03-03 ENCOUNTER — Other Ambulatory Visit: Payer: Self-pay

## 2020-03-03 DIAGNOSIS — D75839 Thrombocytosis, unspecified: Secondary | ICD-10-CM | POA: Insufficient documentation

## 2020-03-03 DIAGNOSIS — D500 Iron deficiency anemia secondary to blood loss (chronic): Secondary | ICD-10-CM

## 2020-03-03 HISTORY — DX: Thrombocytosis, unspecified: D75.839

## 2020-03-03 MED ORDER — SODIUM CHLORIDE 0.9 % IV SOLN
510.0000 mg | Freq: Once | INTRAVENOUS | Status: AC
  Administered 2020-03-03: 510 mg via INTRAVENOUS
  Filled 2020-03-03: qty 510

## 2020-03-03 NOTE — Progress Notes (Deleted)
   Office Visit   Patient ID: Monica Green, female    DOB: April 08, 1985, 34 y.o.   MRN: [id number]  Subjective:  CC: hospital follow up for symptomatic anemia, thrombocytosis  HPI 34 y.o. presents today for the above.  Hospital Follow up for symptomatic anemia She was recently admitted to [facility] 11/16-11/17 for symptomatic anemia. Hemoglobin on admission was 4.4 and iron panel revealed iron deficiency anemia which was believed to be attributable to chronic blood loss secondary to UC. She received 3U of PRBC and hgb correct appropriately to 9.3. She was discharged with instructions to follow up with GI.   Thrombocytosis Platelets were incidentally noted to be 1470 on admission. JAK2 genotype testing was obtained and has returned negative.  On chart review, it appears that platelets were mildly elevated in the 500-800s since 2013. I do not see a history of blood clots on her chart.  Ulcerative colitis Originally diagnosed in 2016 at which time colonoscopy revealed severe pancolitis and biopsy was obtained showing pathology consistent with UC. On her last admission, she had denied any symptoms. She does not follow with GI. She was referred to GI on last admission for ongoing monitoring.         ACTIVE MEDICATIONS   Current Outpatient Medications on File Prior to Visit  Medication Sig Dispense Refill  . acetaminophen (TYLENOL) 325 MG tablet Take 325-650 mg by mouth every 6 (six) hours as needed for mild pain (or headaches).    . iron polysaccharides (NIFEREX) 150 MG capsule Take 1 capsule (150 mg total) by mouth daily. 30 capsule 0   No current facility-administered medications on file prior to visit.    ROS  Review of Systems  Objective:   There were no vitals taken for this visit. Wt Readings from Last 3 Encounters:  02/17/20 131 lb (59.4 kg)  04/07/15 110 lb 3.2 oz (50 kg)  03/26/15 131 lb (59.4 kg)   BP Readings from Last 3 Encounters:  02/18/20  112/76  04/07/15 97/60  03/27/15 128/90   Physical Exam  Health Maintenance:   Health Maintenance  Topic Date Due  . Hepatitis C Screening  Never done  . COVID-19 Vaccine (1) Never done  . TETANUS/TDAP  Never done  . PAP SMEAR-Modifier  Never done  . INFLUENZA VACCINE  Never done  . HIV Screening  Completed     Assessment & Plan:   Problem List Items Addressed This Visit      Digestive   Ulcerative colitis ([place]) - Primary (Chronic)     Other   Iron deficiency anemia (Chronic)   Symptomatic anemia        Pt discussed with ***  Mitzi Hansen, MD Internal Medicine Resident PGY-2 [facility] Pager: [phone] 03/03/2020 11:36 AM

## 2020-03-03 NOTE — Assessment & Plan Note (Deleted)
Platelet count 1470 on recent hospital admission. JAK2 genotype negative.  No prior history of splenectomy.  Plan: repeat CBC today to check platelet count. If it remains elevated, will refer to hematology for ongoing evaluation.

## 2020-03-03 NOTE — Assessment & Plan Note (Deleted)
She was recently admitted to [facility] 11/16-11/17 for symptomatic anemia. Hemoglobin on admission was 4.4 and iron panel revealed iron deficiency anemia which was believed to be attributable to chronic blood loss secondary to UC. She received 3U of PRBC and hgb correct appropriately to 9.3. She was discharged with instructions to follow up with GI.   Plan -CBC today

## 2020-03-04 ENCOUNTER — Telehealth: Payer: Self-pay | Admitting: *Deleted

## 2020-03-04 ENCOUNTER — Encounter: Payer: Self-pay | Admitting: Internal Medicine

## 2020-03-04 DIAGNOSIS — D75839 Thrombocytosis, unspecified: Secondary | ICD-10-CM

## 2020-03-04 DIAGNOSIS — D649 Anemia, unspecified: Secondary | ICD-10-CM

## 2020-03-04 DIAGNOSIS — K51011 Ulcerative (chronic) pancolitis with rectal bleeding: Secondary | ICD-10-CM

## 2020-03-04 DIAGNOSIS — D509 Iron deficiency anemia, unspecified: Secondary | ICD-10-CM

## 2020-03-04 NOTE — Telephone Encounter (Signed)
Call placed to patient for missed appt. No answer. Left message on VM requesting return call to r/s. Hubbard Hartshorn, BSN, RN-BC

## 2020-04-03 NOTE — L&D Delivery Note (Signed)
Delivery Note Labor onset: 11/24/2020  Labor Onset Time: 1200 Complete dilation at 2:38 PM  Onset of pushing at 1438 FHR second stage Cat 2 Analgesia/Anesthesia intrapartum: epidural  Guided pushing with maternal urge. Delivery of a viable female at 1515. Fetal head delivered in ROA position.  Nuchal cord: N/A.  Infant placed on maternal abd, dried, and tactile stim. Spontaneous cry. Cord double clamped after pulsation ceased and cut by FOB.  NICUpresent for birth.  Cord blood sample collected: yes Arterial cord blood sample collected: N/A  Placenta delivered Monica Green via [name], intact, with 3 VC.  AMTSL Placenta to pathology for IUGR. Uterine tone firm, bleeding minimal  1st degree laceration identified.  Anesthesia: epidural Repair: 3-0 vicryl QBL/EBL (mL): [id number] Complications: N/A APGAR: APGAR (1 MIN): 8   APGAR (5 MINS): 9   APGAR (10 MINS):   Mom to postpartum.  Baby to Couplet care / Skin to Skin. Dr Alwyn Pea aware of pt status and [place] MSN, CNM 11/24/2020, 3:42 PM

## 2020-05-25 ENCOUNTER — Inpatient Hospital Stay: Payer: MEDICAID | Attending: Hematology and Oncology

## 2020-05-26 NOTE — Progress Notes (Incomplete)
   Patient Care Team: Patient, No Pcp Per as PCP - General (General Practice)  DIAGNOSIS: No diagnosis found.  CHIEF COMPLIANT: Follow-up of Anemia and thrombocytosis  INTERVAL HISTORY: Monica Green is a 35 y.o. with above-mentioned history of anemia and thrombocytosis. She presents to the clinic today for follow-up.   ALLERGIES:  has No Known Allergies.  MEDICATIONS:  Current Outpatient Medications  Medication Sig Dispense Refill  . acetaminophen (TYLENOL) 325 MG tablet Take 325-650 mg by mouth every 6 (six) hours as needed for mild pain (or headaches).    . iron polysaccharides (NIFEREX) 150 MG capsule Take 1 capsule (150 mg total) by mouth daily. 30 capsule 0   No current facility-administered medications for this visit.    PHYSICAL EXAMINATION: ECOG PERFORMANCE STATUS: {CHL ONC ECOG PS:[id number]}  There were no vitals filed for this visit. There were no vitals filed for this visit.   LABORATORY DATA:  I have reviewed the data as listed CMP Latest Ref Rng & Units 02/18/2020 02/17/2020 04/07/2015  Glucose 70 - 99 mg/dL 84 82 98  BUN 6 - 20 mg/dL <5(L) 6 8  Creatinine 0.44 - 1.00 mg/dL 0.60 0.56 0.43(L)  Sodium 135 - 145 mmol/L 139 137 137  Potassium 3.5 - 5.1 mmol/L 3.5 3.8 3.6  Chloride 98 - 111 mmol/L 111 108 100  CO2 22 - 32 mmol/L 20(L) 23 27  Calcium 8.9 - 10.3 mg/dL 9.2 9.2 8.6  Total Protein 6.5 - 8.1 g/dL 7.8 8.1 5.9(L)  Total Bilirubin 0.3 - 1.2 mg/dL 1.6(H) 0.3 0.3  Alkaline Phos 38 - 126 U/L 35(L) 34(L) 47  AST 15 - 41 U/L 12(L) 13(L) 13  ALT 0 - 44 U/L 7 9 21     Lab Results  Component Value Date   WBC 10.1 02/18/2020   HGB 9.3 (L) 02/18/2020   HCT 32.5 (L) 02/18/2020   MCV 69.4 (L) 02/18/2020   PLT 1,181 ([place]) 02/18/2020   NEUTROABS 6.2 02/18/2020    ASSESSMENT & PLAN:  No problem-specific Assessment & Plan notes found for this encounter.    No orders of the defined types were placed in this encounter.  The patient has a good  understanding of the overall plan. she agrees with it. she will call with any problems that may develop before the next visit here.  Total time spent: *** mins including face to face time and time spent for planning, charting and coordination of care  Rulon Eisenmenger, MD, MPH 05/26/2020  I, Molly Dorshimer, am acting as scribe for Dr. Nicholas Lose.  {insert scribe attestation}

## 2020-05-27 ENCOUNTER — Inpatient Hospital Stay: Payer: MEDICAID | Admitting: Hematology and Oncology

## 2020-05-27 NOTE — Assessment & Plan Note (Deleted)
Hospitalization 02/17/2020-02/18/2020: Hemoglobin 4.4, ferritin 2: 3 units of PRBC and IV iron, thrombocytosis platelet count 1470 (history of ulcerative colitis), cause of anemia suspected to be GI blood loss  Lab review 05/27/2020:

## 2020-07-12 LAB — OB RESULTS CONSOLE ABO/RH: RH Type: POSITIVE

## 2020-07-12 LAB — OB RESULTS CONSOLE RUBELLA ANTIBODY, IGM: Rubella: IMMUNE

## 2020-07-12 LAB — OB RESULTS CONSOLE GC/CHLAMYDIA
Chlamydia: NEGATIVE
Gonorrhea: NEGATIVE

## 2020-07-12 LAB — OB RESULTS CONSOLE HEPATITIS B SURFACE ANTIGEN: Hepatitis B Surface Ag: NEGATIVE

## 2020-07-12 LAB — OB RESULTS CONSOLE HIV ANTIBODY (ROUTINE TESTING): HIV: NONREACTIVE

## 2020-07-12 LAB — OB RESULTS CONSOLE ANTIBODY SCREEN: Antibody Screen: NEGATIVE

## 2020-07-12 LAB — OB RESULTS CONSOLE RPR: RPR: NONREACTIVE

## 2020-07-17 ENCOUNTER — Other Ambulatory Visit: Payer: Self-pay | Admitting: Obstetrics and Gynecology

## 2020-07-18 ENCOUNTER — Other Ambulatory Visit: Payer: Self-pay | Admitting: Obstetrics and Gynecology

## 2020-07-19 ENCOUNTER — Other Ambulatory Visit: Payer: Self-pay | Admitting: Obstetrics and Gynecology

## 2020-07-20 ENCOUNTER — Telehealth: Payer: Self-pay | Admitting: Hematology

## 2020-07-20 LAB — OB RESULTS CONSOLE GBS: GBS: POSITIVE

## 2020-07-20 NOTE — Telephone Encounter (Signed)
Received a call from [place] at [facility] for severe anemia. Ms Mroz has been scheduled to see Dr. Irene Limbo on 4/25 at [place] will notify the pt of the appt date and time.

## 2020-07-25 NOTE — Progress Notes (Signed)
HEMATOLOGY/ONCOLOGY CONSULTATION NOTE  Date of Service: 07/26/2020  Patient Care Team: Patient, No Pcp Per (Inactive) as PCP - General (General Practice)  CHIEF COMPLAINTS/PURPOSE OF CONSULTATION:  Severe Anemia  HISTORY OF PRESENTING ILLNESS:  Monica Green is a wonderful 35 y.o. female who has been referred to [place] by Donnel Saxon, CNM at [facility] for evaluation and management of chronic severe anemia. The pt reports that she is doing well overall.  The pt reports that she is currently [redacted] weeks pregnant. She was seen previously by Dr. Lindi Adie in November 2021 in the hospital for severe anemia due to her ulcerative colitis. The pt notes that she had an allergic reaction to the third and last blood transfusion reactive due to eye itching and hives over her face. She experienced no issues tolerating the Feraheme she received in the past. The pt notes her ulcerative colitis has not been bothering her recently. The pt notes she sees a GI at [facility]. The pt notes that she has needed IV iron in the past. The pt notes no treatments for her ulcerative colitis that was diagnosed in 2017 due to feelings of drowsiness ad heaviness in the past. The pt has been monitoring her food intake. The pt notes that she has not been taking iron supplements in her prenatal, but denies any Iron supplementation by itself due to constipation. The pt notes she was experiencing heavy periods for full seven days in the past prior to getting pregnant. The pt notes that she has not been to her GI since becoming pregnant. The pt experiences regular bowel habits with no noticeable blood or diarrhea. The pt notes that she was recently given an antibiotic for a UTI. 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 The pt notes she has recently been experiencing ice cravings within the last two weeks.  The pt notes no other medical issues outside of her ulcerative colitis or familial  blood disorders. The pt notes only social use of alcohol and denies any smoking prior to pregnancy. The pt notes her paternal grandmother and mother have diverticulitis but notes no major medical issues that run within the family.  Lab results 07/12/2020 of CBC w/diff and CMP is as follows: all values are WNL except for Hgb of 6.6, RBC of 3.47, HCT of 23.6, MCV of 69, MCH of 19.0, MCHC of 27.7, RDW of 18.1. 07/12/2020 Vitamin D 25-Hydroxy of 22.3.  On review of systems, pt reports lightheadedness, dizziness, ice cravings, and denies acute bloody/black stools, abdominal pain, and any other symptoms.  MEDICAL HISTORY:  Past Medical History:  Diagnosis Date  . Anemia   . Ulcerative colitis ([place])     SURGICAL HISTORY: Past Surgical History:  Procedure Laterality Date  . FLEXIBLE SIGMOIDOSCOPY N/A 03/23/2015   Procedure: FLEXIBLE SIGMOIDOSCOPY;  Surgeon: Laurence Spates, MD;  Location: [facility];  Service: Endoscopy;  Laterality: N/A;    SOCIAL HISTORY: Social History   Socioeconomic History  . Marital status: Single    Spouse name: Not on file  . Number of children: 0  . Years of education: Not on file  . Highest education level: Not on file  Occupational History  . Not on file  Tobacco Use  . Smoking status: Never Smoker  . Smokeless tobacco: Never Used  Substance and Sexual Activity  . Alcohol use: Yes    Comment: Social  . Drug use: No  . Sexual activity: Not on  file  Other Topics Concern  . Not on file  Social History Narrative  . Not on file   Social Determinants of Health   Financial Resource Strain: Not on file  Food Insecurity: Not on file  Transportation Needs: Not on file  Physical Activity: Not on file  Stress: Not on file  Social Connections: Not on file  Intimate Partner Violence: Not on file    FAMILY HISTORY: Family History  Problem Relation Age of Onset  . Colon cancer Father     ALLERGIES:  has No Known Allergies.  MEDICATIONS:  Current  Outpatient Medications  Medication Sig Dispense Refill  . acetaminophen (TYLENOL) 325 MG tablet Take 325-650 mg by mouth every 6 (six) hours as needed for mild pain (or headaches).    . cephALEXin (KEFLEX) 500 MG capsule Take 500 mg by mouth 2 (two) times daily.    . Cholecalciferol (VITAMIN D3) 50 MCG (2000 UT) capsule Take 4,000 Units by mouth daily.    . diphenhydrAMINE (BENADRYL) 25 mg capsule Take 25 mg by mouth every 6 (six) hours as needed.    . iron polysaccharides (NIFEREX) 150 MG capsule Take 1 capsule (150 mg total) by mouth daily. 30 capsule 5   No current facility-administered medications for this visit.    REVIEW OF SYSTEMS:   10 Point review of Systems was done is negative except as noted above.  PHYSICAL EXAMINATION: ECOG PERFORMANCE STATUS: 2 - Symptomatic, <50% confined to bed  . Vitals:   07/26/20 1301  BP: 113/64  Pulse: 92  Resp: 16  Temp: 97.6 F (36.4 C)  SpO2: 100%   Filed Weights   07/26/20 1301  Weight: 147 lb 12.8 oz (67 kg)   .Body mass index is 24.6 kg/m.  Exam was given in a chair.   GENERAL:alert, in no acute distress and comfortable SKIN: no acute rashes, no significant lesions EYES: conjunctiva are pink and non-injected, sclera anicteric OROPHARYNX: MMM, no exudates, no oropharyngeal erythema or ulceration NECK: supple, no JVD LYMPH:  no palpable lymphadenopathy in the cervical, axillary or inguinal regions LUNGS: clear to auscultation b/l with normal respiratory effort HEART: regular rate & rhythm ABDOMEN:  normoactive bowel sounds , non tender, not distended. Extremity: no pedal edema PSYCH: alert & oriented x 3 with fluent speech NEURO: no focal motor/sensory deficits  LABORATORY DATA:  I have reviewed the data as listed  . CBC Latest Ref Rng & Units 02/18/2020 02/17/2020 02/17/2020  WBC 4.0 - 10.5 K/uL 10.1 8.9 8.5  Hemoglobin 12.0 - 15.0 g/dL 9.3(L) 4.7(LL) 4.4(LL)  Hematocrit 36.0 - 46.0 % 32.5(L) 21.7(L) 20.1(L)   Platelets 150 - 400 K/uL 1,181(HH) 1,756(HH) 1,470(HH)    . CMP Latest Ref Rng & Units 02/18/2020 02/17/2020 04/07/2015  Glucose 70 - 99 mg/dL 84 82 98  BUN 6 - 20 mg/dL <5(L) 6 8  Creatinine 0.44 - 1.00 mg/dL 0.60 0.56 0.43(L)  Sodium 135 - 145 mmol/L 139 137 137  Potassium 3.5 - 5.1 mmol/L 3.5 3.8 3.6  Chloride 98 - 111 mmol/L 111 108 100  CO2 22 - 32 mmol/L 20(L) 23 27  Calcium 8.9 - 10.3 mg/dL 9.2 9.2 8.6  Total Protein 6.5 - 8.1 g/dL 7.8 8.1 5.9(L)  Total Bilirubin 0.3 - 1.2 mg/dL 1.6(H) 0.3 0.3  Alkaline Phos 38 - 126 U/L 35(L) 34(L) 47  AST 15 - 41 U/L 12(L) 13(L) 13  ALT 0 - 44 U/L 7 9 21      RADIOGRAPHIC STUDIES: I have  personally reviewed the radiological images as listed and agreed with the findings in the report. No results found.  ASSESSMENT & PLAN:    PLAN: -Advised pt that she will need transfusions and IV iron due to iron deficiency and Hgb of 6.6 or lower. This would need to be set up at the [facility] due to current pregnancy. -Advised pt we will need to aggressively replace Hgb and Iron as much as possible due to current pregnancy. -Recommended pt call OBGYN to set up iron and blood transfusions once lab received today due to urgent need. -Recommended pt avoid working with her Hgb very low at 6.6. Advised pt to just rest. -Advised pt her iron demands will increase greatly by the end of second trimester and early third trimester. -Recommended pt increase intake of green leafy vegetables. -Advised pt of urgent need of IV Iron (Venofer x 4-5 doses). Will Rx Iron Polysaccharide. -Continue to monitor for blood transfusion needs. -Will get labs today. -Will see back in 2 months with labs.    FOLLOW UP: Labs today F/u with ObGyn for blood transfusion as needed for Hgb<8 IV Venofer wice weekly x 4 doses at [facility] day center RTC with Dr Irene Limbo in 2 months with labs    All of the patients questions were answered with apparent satisfaction. The  patient knows to call the clinic with any problems, questions or concerns.  I spent 40 minutes counseling the patient face to face. The total time spent in the appointment was 45 minutes and more than 50% was on counseling and direct patient cares.    Sullivan Lone MD [place] AAHIVMS [facility] Hematology/Oncology Physician [facility]  (Office):       [phone] (Work cell):  [phone] (Fax):           [phone]  07/26/2020 1:47 PM  I, Reinaldo Raddle, am acting as scribe for Dr. Sullivan Lone, MD.   .I have reviewed the above documentation for accuracy and completeness, and I agree with the above. Brunetta Genera MD   . CBC Latest Ref Rng & Units 07/28/2020 07/26/2020  WBC 4.0 - 10.5 K/uL 9.3 7.6  Hemoglobin 12.0 - 15.0 g/dL 6.0(LL) 6.1(LL)  Hematocrit 36.0 - 46.0 % 22.3(L) 22.5(L)  Platelets 150 - 400 K/uL 463(H) 405(H)   Plan --patient was recommended to go to [facility] ER for PRBC transfusion and IV Iron  .Brunetta Genera MD

## 2020-07-26 ENCOUNTER — Inpatient Hospital Stay: Payer: Medicaid Other | Admitting: Medical

## 2020-07-26 ENCOUNTER — Telehealth: Payer: Self-pay

## 2020-07-26 ENCOUNTER — Telehealth: Payer: Self-pay | Admitting: Hematology

## 2020-07-26 ENCOUNTER — Inpatient Hospital Stay: Payer: Medicaid Other | Attending: Hematology and Oncology | Admitting: Hematology

## 2020-07-26 ENCOUNTER — Inpatient Hospital Stay: Payer: Medicaid Other

## 2020-07-26 ENCOUNTER — Other Ambulatory Visit: Payer: Self-pay

## 2020-07-26 VITALS — BP 113/64 | HR 92 | Temp 97.6°F | Resp 16 | Ht 65.0 in | Wt 147.8 lb

## 2020-07-26 DIAGNOSIS — K5190 Ulcerative colitis, unspecified, without complications: Secondary | ICD-10-CM

## 2020-07-26 DIAGNOSIS — Z79899 Other long term (current) drug therapy: Secondary | ICD-10-CM | POA: Diagnosis not present

## 2020-07-26 DIAGNOSIS — O99012 Anemia complicating pregnancy, second trimester: Secondary | ICD-10-CM

## 2020-07-26 DIAGNOSIS — O26892 Other specified pregnancy related conditions, second trimester: Secondary | ICD-10-CM

## 2020-07-26 DIAGNOSIS — O99612 Diseases of the digestive system complicating pregnancy, second trimester: Secondary | ICD-10-CM | POA: Diagnosis not present

## 2020-07-26 DIAGNOSIS — Z800 Family history of malignant neoplasm of digestive organs: Secondary | ICD-10-CM | POA: Diagnosis not present

## 2020-07-26 DIAGNOSIS — Z3A22 22 weeks gestation of pregnancy: Secondary | ICD-10-CM | POA: Diagnosis not present

## 2020-07-26 DIAGNOSIS — D649 Anemia, unspecified: Secondary | ICD-10-CM

## 2020-07-26 DIAGNOSIS — K5900 Constipation, unspecified: Secondary | ICD-10-CM | POA: Insufficient documentation

## 2020-07-26 DIAGNOSIS — D509 Iron deficiency anemia, unspecified: Secondary | ICD-10-CM

## 2020-07-26 DIAGNOSIS — H5789 Other specified disorders of eye and adnexa: Secondary | ICD-10-CM

## 2020-07-26 LAB — CMP (CANCER CENTER ONLY)
ALT: 6 U/L (ref 0–44)
AST: 12 U/L — ABNORMAL LOW (ref 15–41)
Albumin: 3.1 g/dL — ABNORMAL LOW (ref 3.5–5.0)
Alkaline Phosphatase: 51 U/L (ref 38–126)
Anion gap: 9 (ref 5–15)
BUN: 5 mg/dL — ABNORMAL LOW (ref 6–20)
CO2: 23 mmol/L (ref 22–32)
Calcium: 8.8 mg/dL — ABNORMAL LOW (ref 8.9–10.3)
Chloride: 106 mmol/L (ref 98–111)
Creatinine: 0.56 mg/dL (ref 0.44–1.00)
GFR, Estimated: >60 mL/min (ref >60)
Glucose, Bld: 88 mg/dL (ref 70–99)
Potassium: 2.7 mmol/L — CL (ref 3.5–5.1)
Sodium: 138 mmol/L (ref 135–145)
Total Bilirubin: 0.4 mg/dL (ref 0.3–1.2)
Total Protein: 7.6 g/dL (ref 6.5–8.1)

## 2020-07-26 LAB — CBC WITH DIFFERENTIAL/PLATELET
Abs Immature Granulocytes: 0.03 10*3/uL (ref 0.00–0.07)
Basophils Absolute: 0.1 10*3/uL (ref 0.0–0.1)
Basophils Relative: 1 %
Eosinophils Absolute: 0.3 10*3/uL (ref 0.0–0.5)
Eosinophils Relative: 3 %
HCT: 22.5 % — ABNORMAL LOW (ref 36.0–46.0)
Hemoglobin: 6.1 g/dL — CL (ref 12.0–15.0)
Immature Granulocytes: 0 %
Lymphocytes Relative: 17 %
Lymphs Abs: 1.3 10*3/uL (ref 0.7–4.0)
MCH: 17.6 pg — ABNORMAL LOW (ref 26.0–34.0)
MCHC: 27.1 g/dL — ABNORMAL LOW (ref 30.0–36.0)
MCV: 64.8 fL — ABNORMAL LOW (ref 80.0–100.0)
Monocytes Absolute: 0.7 10*3/uL (ref 0.1–1.0)
Monocytes Relative: 10 %
Neutro Abs: 5.3 10*3/uL (ref 1.7–7.7)
Neutrophils Relative %: 69 %
Platelets: 405 10*3/uL — ABNORMAL HIGH (ref 150–400)
RBC: 3.47 MIL/uL — ABNORMAL LOW (ref 3.87–5.11)
RDW: 19.5 % — ABNORMAL HIGH (ref 11.5–15.5)
WBC: 7.6 10*3/uL (ref 4.0–10.5)
nRBC: 0 % (ref 0.0–0.2)

## 2020-07-26 LAB — IRON AND TIBC
Iron: 10 ug/dL — ABNORMAL LOW (ref 41–142)
Saturation Ratios: 2 % — ABNORMAL LOW (ref 21–57)
TIBC: 489 ug/dL — ABNORMAL HIGH (ref 236–444)
UIBC: 479 ug/dL — ABNORMAL HIGH (ref 120–384)

## 2020-07-26 LAB — ABO/RH: ABO/RH(D): B POS

## 2020-07-26 LAB — FERRITIN: Ferritin: 5 ng/mL — ABNORMAL LOW (ref 11–307)

## 2020-07-26 LAB — VITAMIN B12: Vitamin B-12: 167 pg/mL — ABNORMAL LOW (ref 180–914)

## 2020-07-26 MED ORDER — POLYSACCHARIDE IRON COMPLEX 150 MG PO CAPS: 150.0000 mg | ORAL_CAPSULE | Freq: Every day | ORAL | 5 refills | Status: DC

## 2020-07-26 NOTE — Progress Notes (Signed)
Critical potassium level of 2.7 was called in by Malachy Chamber at 1455 from the lab. Dr. Irene Limbo was notified, no orders given to this RN.

## 2020-07-26 NOTE — Telephone Encounter (Signed)
Scheduled follow-up appointments per 4/25 los. Patient is aware. 

## 2020-07-26 NOTE — Telephone Encounter (Signed)
Left message for, pt per Dr Irene Limbo,  pt needs to go to [facility] ER to get blood d/t Hgb of 6.1. Attempted to reach pt multiple times. Asked pt to call back to verify that she got the message. Unable to reach pt's other listed contact.  CRITICAL VALUE STICKER  CRITICAL VALUE: Hgb 6.1  DATE & TIME NOTIFIED: 07/26/20 02:24 pm  MD NOTIFIED: Dr Irene Limbo  TIME OF NOTIFICATION:02:30  RESPONSE: see above

## 2020-07-28 ENCOUNTER — Other Ambulatory Visit: Payer: Self-pay | Admitting: Obstetrics and Gynecology

## 2020-07-28 ENCOUNTER — Encounter (HOSPITAL_COMMUNITY): Payer: Self-pay | Admitting: Obstetrics and Gynecology

## 2020-07-28 ENCOUNTER — Inpatient Hospital Stay (HOSPITAL_COMMUNITY)
Admission: AD | Admit: 2020-07-28 | Discharge: 2020-07-29 | Disposition: A | Discharge status: Home / Self Care | Payer: Medicaid Other | Attending: Obstetrics & Gynecology | Admitting: Obstetrics & Gynecology

## 2020-07-28 ENCOUNTER — Other Ambulatory Visit: Payer: Self-pay

## 2020-07-28 DIAGNOSIS — Z20822 Contact with and (suspected) exposure to covid-19: Secondary | ICD-10-CM | POA: Insufficient documentation

## 2020-07-28 DIAGNOSIS — O99112 Other diseases of the blood and blood-forming organs and certain disorders involving the immune mechanism complicating pregnancy, second trimester: Secondary | ICD-10-CM | POA: Diagnosis not present

## 2020-07-28 DIAGNOSIS — E876 Hypokalemia: Secondary | ICD-10-CM | POA: Diagnosis not present

## 2020-07-28 DIAGNOSIS — Z3A21 21 weeks gestation of pregnancy: Secondary | ICD-10-CM | POA: Insufficient documentation

## 2020-07-28 DIAGNOSIS — O99282 Endocrine, nutritional and metabolic diseases complicating pregnancy, second trimester: Secondary | ICD-10-CM | POA: Insufficient documentation

## 2020-07-28 DIAGNOSIS — O09522 Supervision of elderly multigravida, second trimester: Secondary | ICD-10-CM | POA: Diagnosis not present

## 2020-07-28 DIAGNOSIS — O99012 Anemia complicating pregnancy, second trimester: Secondary | ICD-10-CM | POA: Diagnosis not present

## 2020-07-28 DIAGNOSIS — D509 Iron deficiency anemia, unspecified: Secondary | ICD-10-CM | POA: Diagnosis present

## 2020-07-28 LAB — COMPREHENSIVE METABOLIC PANEL
ALT: 9 U/L (ref 0–44)
AST: 13 U/L — ABNORMAL LOW (ref 15–41)
Albumin: 2.8 g/dL — ABNORMAL LOW (ref 3.5–5.0)
Alkaline Phosphatase: 46 U/L (ref 38–126)
Anion gap: 7 (ref 5–15)
BUN: <5 mg/dL — ABNORMAL LOW (ref 6–20)
CO2: 24 mmol/L (ref 22–32)
Calcium: 8.8 mg/dL — ABNORMAL LOW (ref 8.9–10.3)
Chloride: 105 mmol/L (ref 98–111)
Creatinine, Ser: 0.47 mg/dL (ref 0.44–1.00)
GFR, Estimated: >60 mL/min (ref >60)
Glucose, Bld: 94 mg/dL (ref 70–99)
Potassium: 3 mmol/L — ABNORMAL LOW (ref 3.5–5.1)
Sodium: 136 mmol/L (ref 135–145)
Total Bilirubin: 0.3 mg/dL (ref 0.3–1.2)
Total Protein: 6.7 g/dL (ref 6.5–8.1)

## 2020-07-28 LAB — CBC
HCT: 22.3 % — ABNORMAL LOW (ref 36.0–46.0)
Hemoglobin: 6 g/dL — CL (ref 12.0–15.0)
MCH: 17.6 pg — ABNORMAL LOW (ref 26.0–34.0)
MCHC: 26.9 g/dL — ABNORMAL LOW (ref 30.0–36.0)
MCV: 65.6 fL — ABNORMAL LOW (ref 80.0–100.0)
Platelets: 463 10*3/uL — ABNORMAL HIGH (ref 150–400)
RBC: 3.4 MIL/uL — ABNORMAL LOW (ref 3.87–5.11)
RDW: 19.1 % — ABNORMAL HIGH (ref 11.5–15.5)
WBC: 9.3 10*3/uL (ref 4.0–10.5)
nRBC: 0.3 % — ABNORMAL HIGH (ref 0.0–0.2)

## 2020-07-28 LAB — PREPARE RBC (CROSSMATCH)

## 2020-07-28 MED ORDER — DIPHENHYDRAMINE HCL 25 MG PO CAPS
25.0000 mg | ORAL_CAPSULE | Freq: Once | ORAL | Status: AC
  Administered 2020-07-28: 25 mg via ORAL
  Filled 2020-07-28: qty 1

## 2020-07-28 MED ORDER — ACETAMINOPHEN 325 MG PO TABS
650.0000 mg | ORAL_TABLET | Freq: Once | ORAL | Status: AC
  Administered 2020-07-28: 650 mg via ORAL
  Filled 2020-07-28: qty 2

## 2020-07-28 MED ORDER — SODIUM CHLORIDE 0.9% IV SOLUTION: Freq: Once | INTRAVENOUS | Status: AC

## 2020-07-28 MED ORDER — PRENATAL MULTIVITAMIN CH
1.0000 | ORAL_TABLET | Freq: Every day | ORAL | Status: DC
  Administered 2020-07-29: 1 via ORAL
  Filled 2020-07-28: qty 1

## 2020-07-28 MED ORDER — DOCUSATE SODIUM 100 MG PO CAPS
100.0000 mg | ORAL_CAPSULE | Freq: Every day | ORAL | Status: DC
  Administered 2020-07-29: 100 mg via ORAL
  Filled 2020-07-28: qty 1

## 2020-07-28 MED ORDER — ACETAMINOPHEN 325 MG PO TABS
650.0000 mg | ORAL_TABLET | ORAL | Status: DC | PRN
  Administered 2020-07-29: 650 mg via ORAL
  Filled 2020-07-28: qty 2

## 2020-07-28 MED ORDER — ZOLPIDEM TARTRATE 5 MG PO TABS: 5.0000 mg | ORAL_TABLET | Freq: Every evening | ORAL | Status: DC | PRN

## 2020-07-28 MED ORDER — CALCIUM CARBONATE ANTACID 500 MG PO CHEW: 2.0000 | CHEWABLE_TABLET | ORAL | Status: DC | PRN

## 2020-07-28 MED ORDER — POTASSIUM CHLORIDE CRYS ER 20 MEQ PO TBCR
40.0000 meq | EXTENDED_RELEASE_TABLET | Freq: Two times a day (BID) | ORAL | Status: DC
  Administered 2020-07-28 – 2020-07-29 (×2): 40 meq via ORAL
  Filled 2020-07-28 (×2): qty 2

## 2020-07-28 NOTE — H&P (Signed)
Monica Green is a 35 y.o. female, G1P0000, IUP at 21.5 weeks, presenting for 24 hours OBS for 2 unit RBC and potassium replacement, seen at hematology today and recommended blood transfusion. Pt asymptomatic. Consult visit with Dr. Irene Limbo 07/27/20--hgb 6.1, K+ 2.7. Recommendation for blood and IV Venofer transfusions, with [facility] to coordinate. Consult note in chart for review. Consulted with AR, recommended admission on HR Antenatal Unit at [facility], plans 2 U PRBCs and IV Venofer, repeat labs, K+ replacement. Admission hgb 6.0, will transfuse 2 units RBC, potassium 3.0 will start PO kdur 40mg  BID, will recheck cmp, cbc in morning. Pt endorse + Fm. Denies vaginal leakage. Denies vaginal bleeding. Denies feeling cxt's.   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  Recheck with NOB labs.) ulcerative colitis (Dx 2017, no meds, followed at [facility]) vitamin D deficiency (Noted at NOB, rx'd supplementation, recheck PP.)  Patient Active Problem List   Diagnosis Date Noted  . Thrombocytosis 03/03/2020  . Symptomatic anemia 02/17/2020  . Ulcerative colitis ([place]) 04/08/2015  . Protein-calorie malnutrition, severe 03/22/2015  . Generalized abdominal pain   . Iron deficiency anemia 01/06/2015  . Ureterolithiasis 01/06/2015     Active Ambulatory Problems    Diagnosis Date Noted  . Iron deficiency anemia 01/06/2015  . Ureterolithiasis 01/06/2015  . Generalized abdominal pain   .  Protein-calorie malnutrition, severe 03/22/2015  . Ulcerative colitis ([place]) 04/08/2015  . Symptomatic anemia 02/17/2020  . Thrombocytosis 03/03/2020   Resolved Ambulatory Problems    Diagnosis Date Noted  . Microcytic hypochromic anemia 01/05/2015  . Symptomatic anemia 01/05/2015  . Bacterial vaginosis 01/06/2015  . AP (abdominal pain) 03/21/2015  . Bloody diarrhea   . Hypokalemia   . Rectal bleed 03/21/2015   Past Medical History:  Diagnosis Date  . Anemia       Medications Prior to Admission  Medication Sig Dispense Refill Last Dose  . acetaminophen (TYLENOL) 325 MG tablet Take 325-650 mg by mouth every 6 (six) hours as needed for mild pain (or headaches).     . cephALEXin (KEFLEX) 500 MG capsule Take 500 mg by mouth 2 (two) times daily.     . Cholecalciferol (VITAMIN D3) 50 MCG (2000 UT) capsule Take 4,000 Units by mouth daily.     . diphenhydrAMINE (BENADRYL) 25 mg capsule Take 25 mg by mouth every 6 (six) hours as needed.     . iron polysaccharides (NIFEREX) 150 MG capsule Take 1 capsule (150 mg total) by mouth daily. 30 capsule 5     Past Medical History:  Diagnosis Date  . Anemia   . Ulcerative colitis ([place])      No current facility-administered medications on file prior to encounter.   Current Outpatient Medications on File Prior to Encounter  Medication Sig Dispense Refill  . acetaminophen (TYLENOL) 325 MG tablet Take 325-650 mg by mouth every 6 (six) hours as needed for mild pain (or headaches).    . cephALEXin (KEFLEX) 500 MG capsule Take 500 mg by mouth 2 (two) times daily.    Marland Kitchen  Cholecalciferol (VITAMIN D3) 50 MCG (2000 UT) capsule Take 4,000 Units by mouth daily.    . diphenhydrAMINE (BENADRYL) 25 mg capsule Take 25 mg by mouth every 6 (six) hours as needed.    . iron polysaccharides (NIFEREX) 150 MG capsule Take 1 capsule (150 mg total) by mouth daily. 30 capsule 5     No Known Allergies  OB History    Gravida  1   Para      Term      Preterm       AB      Living        SAB      IAB      Ectopic      Multiple      Live Births             Past Medical History:  Diagnosis Date  . Anemia   . Ulcerative colitis [facility])    Past Surgical History:  Procedure Laterality Date  . FLEXIBLE SIGMOIDOSCOPY N/A 03/23/2015   Procedure: FLEXIBLE SIGMOIDOSCOPY;  Surgeon: Laurence Spates, MD;  Location: [place];  Service: Endoscopy;  Laterality: N/A;   Family History: family history includes Colon cancer in her father. Social History:  reports that she has never smoked. She has never used smokeless tobacco. She reports current alcohol use. She reports that she does not use drugs.   ROS:  Review of Systems  Constitutional: Negative.   Eyes: Negative.   Respiratory: Negative.   Cardiovascular: Negative.   Gastrointestinal: Negative.   Musculoskeletal: Negative.   Skin: Negative.   Neurological: Negative.   Endo/Heme/Allergies: Negative.      Physical Exam: BP 115/66 (BP Location: Right Arm)   Pulse 87   Temp 98.1 F (36.7 C) (Oral)   Resp 19   Ht 5\' 6"  (1.676 m)   Wt 66.7 kg   SpO2 100%   BMI 23.73 kg/m   Physical Exam Vitals and nursing note reviewed. Exam conducted with a chaperone present.  Constitutional:      Appearance: Normal appearance.  HENT:     Head: Normocephalic and atraumatic.     Nose: Nose normal.     Mouth/Throat:     Mouth: Mucous membranes are moist.  Eyes:     Conjunctiva/sclera: Conjunctivae normal.  Cardiovascular:     Rate and Rhythm: Normal rate and regular rhythm.     Pulses: Normal pulses.     Heart sounds: Normal heart sounds.  Pulmonary:     Effort: Pulmonary effort is normal.     Breath sounds: Normal breath sounds.  Abdominal:     General: Bowel sounds are normal.  Musculoskeletal:        General: Normal range of motion.     Cervical back: Normal range of motion and neck supple.  Skin:    General: Skin is warm.     Capillary Refill: Capillary refill takes less than 2  seconds.  Neurological:     General: No focal deficit present.     Mental Status: She is alert.  Psychiatric:        Mood and Affect: Mood normal.     UC:   None  Labs: Results for orders placed or performed during the hospital encounter of 07/28/20 (from the past 24 hour(s))  Type and screen [place]     Status: None   Collection Time: 07/28/20  5:52 PM  Result Value Ref Range   ABO/RH(D) B POS  Antibody Screen NEG    Sample Expiration      07/31/2020,2359 Performed at [facility], [place] [address] [place], [place] [address]   CBC on admission     Status: Abnormal   Collection Time: 07/28/20  5:55 PM  Result Value Ref Range   WBC 9.3 4.0 - 10.5 K/uL   RBC 3.40 (L) 3.87 - 5.11 MIL/uL   Hemoglobin 6.0 (LL) 12.0 - 15.0 g/dL   HCT 22.3 (L) 36.0 - 46.0 %   MCV 65.6 (L) 80.0 - 100.0 fL   MCH 17.6 (L) 26.0 - 34.0 pg   MCHC 26.9 (L) 30.0 - 36.0 g/dL   RDW 19.1 (H) 11.5 - 15.5 %   Platelets 463 (H) 150 - 400 K/uL   nRBC 0.3 (H) 0.0 - 0.2 %    Imaging:  No results found.  MAU Course: Orders Placed This Encounter  Procedures  . SARS CORONAVIRUS 2 (TAT 6-24 HRS) Nasopharyngeal Nasopharyngeal Swab  . Comprehensive metabolic panel  . CBC on admission  . Diet regular Room service appropriate? Yes; Fluid consistency: Thin  . Notify physician (specify)  . Vital signs  . Defer vaginal exam for vaginal bleeding or PROM <37 weeks  . Initiate Oral Care Protocol  . Initiate Carrier Fluid Protocol  . SCDs  . Informed Consent Details: Physician/Practitioner Attestation; Transcribe to consent form and obtain patient signature  . Assess fetal heart tones by doppler  . Activity as tolerated  . H & H post transfustion-  RN to place lab order with appropriate draw time  . Full code  . Type and screen [place]  . Prepare RBC (crossmatch)   Meds ordered this encounter  Medications  . acetaminophen (TYLENOL) tablet 650 mg  . zolpidem  (AMBIEN) tablet 5 mg  . docusate sodium (COLACE) capsule 100 mg  . calcium carbonate (TUMS - dosed in mg elemental calcium) chewable tablet 400 mg of elemental calcium  . prenatal multivitamin tablet 1 tablet  . 0.9 %  sodium chloride infusion (Manually program via Guardrails IV Fluids)  . acetaminophen (TYLENOL) tablet 650 mg  . diphenhydrAMINE (BENADRYL) capsule 25 mg    Assessment/Plan: SUDIE HOBERG is a 35 y.o. female, G1P0000, IUP at 21.5 weeks, presenting for 24 hours OBS for 2 unit RBC and potassium replacement, seen at hematology today and recommended blood transfusion. Pt asymptomatic. Consult visit with Dr. Irene Limbo 07/27/20--hgb 6.1, K+ 2.7. Recommendation for blood and IV Venofer transfusions, with [facility] to coordinate. Consult note in chart for review. Consulted with AR, recommended admission on HR Antenatal Unit at [facility], plans 2 U PRBCs and IV Venofer, repeat labs, K+ replacement. Admission hgb 6.0, will transfuse 2 units RBC, potassium 3.0 will start PO kdur 40mg  BID, will recheck cmp, cbc in morning. Pt endorse + Fm. Denies vaginal leakage. Denies vaginal bleeding. Denies feeling cxt's.   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  Recheck with NOB labs.) ulcerative colitis (Dx 2017, no meds, followed at [facility]) vitamin  D deficiency (Noted at NOB, rx'd supplementation, recheck PP.)  Plan: Admit to [place] per consult with DR Mancel Bale Routine [facility]  orders Pain med/epidural prn Plan 2 units over 4 hours each, repeat cbc 4 hour post second unit rbc.  IV venfer to be tomorrow. Hypokalemia: continue 40mg  BIF PO kdur, repeat cmp in morning Anticipate labor progression   Noralyn Pick NP-C, CNM, MSN 07/28/2020, 7:25 PM

## 2020-07-28 NOTE — MAU Note (Signed)
Presents with c/o anemia and tiredness.  States sent by MD for Iron and blood transfusion.  LMP 01/25/2020.

## 2020-07-29 DIAGNOSIS — D509 Iron deficiency anemia, unspecified: Secondary | ICD-10-CM | POA: Diagnosis present

## 2020-07-29 DIAGNOSIS — O99012 Anemia complicating pregnancy, second trimester: Secondary | ICD-10-CM | POA: Diagnosis not present

## 2020-07-29 LAB — TYPE AND SCREEN
ABO/RH(D): B POS
Antibody Screen: NEGATIVE
Unit division: 0
Unit division: 0

## 2020-07-29 LAB — BPAM RBC
Blood Product Expiration Date: 202205222359
Blood Product Expiration Date: 202205222359
ISSUE DATE / TIME: 202204272049
ISSUE DATE / TIME: 202204272354
Unit Type and Rh: 7300
Unit Type and Rh: 7300

## 2020-07-29 LAB — COMPREHENSIVE METABOLIC PANEL
ALT: 11 U/L (ref 0–44)
AST: 12 U/L — ABNORMAL LOW (ref 15–41)
Albumin: 2.5 g/dL — ABNORMAL LOW (ref 3.5–5.0)
Alkaline Phosphatase: 40 U/L (ref 38–126)
Anion gap: 6 (ref 5–15)
BUN: <5 mg/dL — ABNORMAL LOW (ref 6–20)
CO2: 21 mmol/L — ABNORMAL LOW (ref 22–32)
Calcium: 8.3 mg/dL — ABNORMAL LOW (ref 8.9–10.3)
Chloride: 109 mmol/L (ref 98–111)
Creatinine, Ser: 0.42 mg/dL — ABNORMAL LOW (ref 0.44–1.00)
GFR, Estimated: >60 mL/min (ref >60)
Glucose, Bld: 82 mg/dL (ref 70–99)
Potassium: 2.9 mmol/L — ABNORMAL LOW (ref 3.5–5.1)
Sodium: 136 mmol/L (ref 135–145)
Total Bilirubin: 0.6 mg/dL (ref 0.3–1.2)
Total Protein: 6.2 g/dL — ABNORMAL LOW (ref 6.5–8.1)

## 2020-07-29 LAB — HEMOGLOBIN AND HEMATOCRIT, BLOOD
HCT: 27.6 % — ABNORMAL LOW (ref 36.0–46.0)
Hemoglobin: 8 g/dL — ABNORMAL LOW (ref 12.0–15.0)

## 2020-07-29 LAB — SARS CORONAVIRUS 2 (TAT 6-24 HRS): SARS Coronavirus 2: NEGATIVE

## 2020-07-29 MED ORDER — SODIUM CHLORIDE 0.9 % IV SOLN
500.0000 mg | Freq: Once | INTRAVENOUS | Status: AC
  Administered 2020-07-29: 500 mg via INTRAVENOUS
  Filled 2020-07-29: qty 25

## 2020-07-29 NOTE — Progress Notes (Signed)
Monica Green G1P0 [redacted]w[redacted]d Admitted for anemia and plan of care to receive 2 U PRBC and IV Venofer. S/P 7 hrs since blood transfusion. Called Pharmacy to confirm that it was safe to administer IV iron post transfusion.   Subjective:    Reports feeling good after transfusion. Ambulating in room independently, denies symptoms of anemia. Desires discharge ASAP.   Objective:    VS: BP 113/66 (BP Location: Right Arm)   Pulse 86   Temp 98.2 F (36.8 C) (Oral)   Resp 16   Ht 5\' 6"  (1.676 m)   Wt 66.7 kg   SpO2 100%   BMI 23.73 kg/m  FHR : doppler 145 Toco: no ctx Membranes: no S/S of LOF   SVD: deferred, no labor complaints  Assessment/Plan:   35 y.o. G1P0 [redacted]w[redacted]d  IDA    -S/P 2U PRBC    -IV Venofer today    -pt has a F/U w/ hemotologist    -continue Niferex 150mg  PO daily Fetal well-being    -reports active FM, doppler 145 Discharge after IV iron infusion complete   [name] MSN, CNM 07/29/2020 10:08 AM

## 2020-07-29 NOTE — Discharge Summary (Signed)
Antepartum Discharge Summary  Date of Service updated 07/29/2020     Patient Name: Monica Green DOB: [birth year] MRN: [id number]  Date of admission: 07/28/2020 Date of discharge: 07/29/2020  Admitting diagnosis: Tired, anemic Intrauterine pregnancy: [redacted]w[redacted]d     Secondary diagnosis:  Active Problems:   Iron deficiency anemia  Additional problems:  Patient Active Problem List   Diagnosis Date Noted  . Iron deficiency anemia 07/29/2020  . Thrombocytosis 03/03/2020  . Symptomatic anemia 02/17/2020  . Ulcerative colitis ([place]) 04/08/2015  . Ureterolithiasis 01/06/2015       Discharge diagnosis: iron deficiency anemia                                              In-patient procedures:blood transfusion a nd iron infusion Hospital course: referred from hematology for symptomatic anemia with Hgb of 6.0 for transfusion of 2 units PRBC. Pt tolerated transfusion well and reported improvement in symptoms at time of discharge.   Physical exam  Vitals:   07/29/20 0350 07/29/20 0839 07/29/20 1229 07/29/20 1525  BP:  113/66 114/69 113/66  Pulse:  86 89 83  Resp:  16 17 18   Temp:  98.2 F (36.8 C) 98.3 F (36.8 C) 98.4 F (36.9 C)  TempSrc:  Oral Oral Oral  SpO2: 99% 100% 100% 100%  Weight:      Height:       General: alert, cooperative and no distress CV: RRR, no additional sounds Resp: unlabored, clear DVT Evaluation: No evidence of DVT seen on physical exam. No cords or calf tenderness. No significant calf/ankle edema. Labs: Lab Results  Component Value Date   WBC 9.3 07/28/2020   HGB 8.0 (L) 07/29/2020   HCT 27.6 (L) 07/29/2020   MCV 65.6 (L) 07/28/2020   PLT 463 (H) 07/28/2020   CMP Latest Ref Rng & Units 07/29/2020  Glucose 70 - 99 mg/dL 82  BUN 6 - 20 mg/dL <5(L)  Creatinine 0.44 - 1.00 mg/dL 0.42(L)  Sodium 135 - 145 mmol/L 136  Potassium 3.5 - 5.1 mmol/L 2.9(L)  Chloride 98 - 111 mmol/L 109  CO2 22 - 32 mmol/L 21(L)  Calcium 8.9 - 10.3 mg/dL 8.3(L)   Total Protein 6.5 - 8.1 g/dL 6.2(L)  Total Bilirubin 0.3 - 1.2 mg/dL 0.6  Alkaline Phos 38 - 126 U/L 40  AST 15 - 41 U/L 12(L)  ALT 0 - 44 U/L 11   Edinburgh Score: No flowsheet data found.    After visit meds:  Allergies as of 07/29/2020   No Known Allergies     Medication List    TAKE these medications   acetaminophen 325 MG tablet Commonly known as: TYLENOL Take 325-650 mg by mouth every 6 (six) hours as needed for mild pain (or headaches).   cephALEXin 500 MG capsule Commonly known as: KEFLEX Take 500 mg by mouth 2 (two) times daily.   diphenhydrAMINE 25 mg capsule Commonly known as: BENADRYL Take 25 mg by mouth every 6 (six) hours as needed.   iron polysaccharides 150 MG capsule Commonly known as: NIFEREX Take 1 capsule (150 mg total) by mouth daily.   Vitamin D3 50 MCG (2000 UT) capsule Take 4,000 Units by mouth daily.        Discharge home in stable condition Discharge instruction: Anemia symptoms to report, preterm labor instruction. Activity: Advance as tolerated. Report symptomatic  anemia  Diet: iron rich diet Follow-up with [facility] as scheduled Future Appointments: Future Appointments  Date Time Provider [place]  09/27/2020  1:30 PM [facility] None  09/27/2020  2:00 PM Brunetta Genera, MD [facility] None   Follow up Visit:  [facility]. Go to.   Specialty: Obstetrics and Gynecology Why: Follow-u at next scheduled appointment Contact information: [place]. [address] [place] [address] [phone]                  07/29/2020 [name], CNM

## 2020-08-02 NOTE — Progress Notes (Signed)
Erroneous encounter

## 2020-08-03 ENCOUNTER — Other Ambulatory Visit: Payer: Self-pay | Admitting: Obstetrics and Gynecology

## 2020-08-12 ENCOUNTER — Ambulatory Visit (HOSPITAL_COMMUNITY)
Admission: RE | Admit: 2020-08-12 | Discharge: 2020-08-12 | Disposition: A | Discharge status: Home / Self Care | Payer: Medicaid Other | Source: Ambulatory Visit | Attending: Hematology | Admitting: Hematology

## 2020-08-12 ENCOUNTER — Other Ambulatory Visit: Payer: Self-pay

## 2020-08-12 DIAGNOSIS — O99013 Anemia complicating pregnancy, third trimester: Secondary | ICD-10-CM | POA: Diagnosis not present

## 2020-08-12 MED ORDER — SODIUM CHLORIDE 0.9 % IV SOLN
500.0000 mg | INTRAVENOUS | Status: DC
  Administered 2020-08-12: 500 mg via INTRAVENOUS
  Filled 2020-08-12: qty 25

## 2020-08-19 ENCOUNTER — Encounter (HOSPITAL_COMMUNITY)
Admission: RE | Admit: 2020-08-19 | Discharge: 2020-08-19 | Disposition: A | Discharge status: Home / Self Care | Payer: Medicaid Other | Source: Ambulatory Visit | Attending: Hematology | Admitting: Hematology

## 2020-08-19 ENCOUNTER — Other Ambulatory Visit: Payer: Self-pay

## 2020-08-19 DIAGNOSIS — Z3A00 Weeks of gestation of pregnancy not specified: Secondary | ICD-10-CM | POA: Diagnosis not present

## 2020-08-19 DIAGNOSIS — D509 Iron deficiency anemia, unspecified: Secondary | ICD-10-CM | POA: Insufficient documentation

## 2020-08-19 DIAGNOSIS — O99012 Anemia complicating pregnancy, second trimester: Secondary | ICD-10-CM | POA: Insufficient documentation

## 2020-08-19 MED ORDER — SODIUM CHLORIDE 0.9 % IV SOLN
500.0000 mg | INTRAVENOUS | Status: DC
  Administered 2020-08-19: 500 mg via INTRAVENOUS
  Filled 2020-08-19: qty 25

## 2020-08-26 ENCOUNTER — Other Ambulatory Visit: Payer: Self-pay

## 2020-08-26 ENCOUNTER — Encounter (HOSPITAL_COMMUNITY)
Admission: RE | Admit: 2020-08-26 | Discharge: 2020-08-26 | Disposition: A | Discharge status: Home / Self Care | Payer: Medicaid Other | Source: Ambulatory Visit | Attending: Hematology | Admitting: Hematology

## 2020-08-26 DIAGNOSIS — O99012 Anemia complicating pregnancy, second trimester: Secondary | ICD-10-CM | POA: Diagnosis not present

## 2020-08-26 MED ORDER — SODIUM CHLORIDE 0.9 % IV SOLN
500.0000 mg | INTRAVENOUS | Status: DC
  Administered 2020-08-26: 500 mg via INTRAVENOUS
  Filled 2020-08-26: qty 25

## 2020-09-27 ENCOUNTER — Inpatient Hospital Stay: Payer: Medicaid Other | Admitting: Hematology

## 2020-09-27 ENCOUNTER — Inpatient Hospital Stay (HOSPITAL_BASED_OUTPATIENT_CLINIC_OR_DEPARTMENT_OTHER): Payer: Medicaid Other | Admitting: Physician Assistant

## 2020-09-27 ENCOUNTER — Other Ambulatory Visit: Payer: Self-pay

## 2020-09-27 ENCOUNTER — Inpatient Hospital Stay: Payer: Medicaid Other | Attending: Hematology and Oncology

## 2020-09-27 ENCOUNTER — Telehealth: Payer: Self-pay | Admitting: Hematology

## 2020-09-27 ENCOUNTER — Inpatient Hospital Stay: Payer: Medicaid Other

## 2020-09-27 VITALS — BP 121/80 | HR 93 | Temp 98.2°F | Resp 18 | Ht 66.0 in | Wt 153.0 lb

## 2020-09-27 DIAGNOSIS — O99012 Anemia complicating pregnancy, second trimester: Secondary | ICD-10-CM | POA: Diagnosis present

## 2020-09-27 DIAGNOSIS — D508 Other iron deficiency anemias: Secondary | ICD-10-CM

## 2020-09-27 DIAGNOSIS — Z79899 Other long term (current) drug therapy: Secondary | ICD-10-CM | POA: Insufficient documentation

## 2020-09-27 DIAGNOSIS — D509 Iron deficiency anemia, unspecified: Secondary | ICD-10-CM | POA: Diagnosis not present

## 2020-09-27 DIAGNOSIS — R5383 Other fatigue: Secondary | ICD-10-CM | POA: Diagnosis not present

## 2020-09-27 DIAGNOSIS — Z800 Family history of malignant neoplasm of digestive organs: Secondary | ICD-10-CM | POA: Diagnosis not present

## 2020-09-27 DIAGNOSIS — Z3A00 Weeks of gestation of pregnancy not specified: Secondary | ICD-10-CM | POA: Diagnosis not present

## 2020-09-27 DIAGNOSIS — R197 Diarrhea, unspecified: Secondary | ICD-10-CM | POA: Insufficient documentation

## 2020-09-27 LAB — CBC WITH DIFFERENTIAL (CANCER CENTER ONLY)
Abs Immature Granulocytes: 0.01 10*3/uL (ref 0.00–0.07)
Basophils Absolute: 0 10*3/uL (ref 0.0–0.1)
Basophils Relative: 0 %
Eosinophils Absolute: 0.2 10*3/uL (ref 0.0–0.5)
Eosinophils Relative: 4 %
HCT: 36.6 % (ref 36.0–46.0)
Hemoglobin: 12 g/dL (ref 12.0–15.0)
Immature Granulocytes: 0 %
Lymphocytes Relative: 21 %
Lymphs Abs: 1.1 10*3/uL (ref 0.7–4.0)
MCH: 28 pg (ref 26.0–34.0)
MCHC: 32.8 g/dL (ref 30.0–36.0)
MCV: 85.3 fL (ref 80.0–100.0)
Monocytes Absolute: 0.4 10*3/uL (ref 0.1–1.0)
Monocytes Relative: 8 %
Neutro Abs: 3.4 10*3/uL (ref 1.7–7.7)
Neutrophils Relative %: 67 %
Platelet Count: 298 10*3/uL (ref 150–400)
RBC: 4.29 MIL/uL (ref 3.87–5.11)
RDW: 21.4 % — ABNORMAL HIGH (ref 11.5–15.5)
WBC Count: 5.1 10*3/uL (ref 4.0–10.5)
nRBC: 0 % (ref 0.0–0.2)

## 2020-09-27 LAB — IRON AND TIBC
Iron: 108 ug/dL (ref 41–142)
Saturation Ratios: 32 % (ref 21–57)
TIBC: 338 ug/dL (ref 236–444)
UIBC: 230 ug/dL (ref 120–384)

## 2020-09-27 LAB — FERRITIN: Ferritin: 103 ng/mL (ref 11–307)

## 2020-09-27 NOTE — Telephone Encounter (Signed)
Rescheduled appointment per provider. Patient is aware. 

## 2020-09-28 NOTE — Progress Notes (Signed)
HEMATOLOGY/ONCOLOGY  PROGRESS NOTE  Date of Service: 09/28/2020  Patient Care Team: Patient, No Pcp Per (Inactive) as PCP - General (General Practice)  CHIEF COMPLAINT:  Iron deficiency anemia  INTERVAL HISTORY: Monica Green returns to the clinic for follow-up for iron deficiency anemia during pregnancy.  Patient's most recent visit was on 07/26/2020.  Since then, patient was was admitted for 24 hours observation from 07/27/2020-07/28/2020 and received 2 units of packed RBCs and IV Venofer.  Afterwards, patient resting additional doses of IV Venofer, completed on 08/26/2020.  On exam today, patient reports that her energy levels have improved.  She has mild fatigue but more active after receiving IV iron.  He has a good appetite.  He denies any nausea, vomiting or abdominal pain.  She reports occasional episodes of diarrhea secondary to colitis.  She denies any hematochezia or melena.  Patient denies any fevers, chills, night sweats, shortness of breath, chest pain or cough.  She has no other complaints.  MEDICAL HISTORY:  Past Medical History:  Diagnosis Date   Anemia    Ulcerative colitis ([place])     SURGICAL HISTORY: Past Surgical History:  Procedure Laterality Date   FLEXIBLE SIGMOIDOSCOPY N/A 03/23/2015   Procedure: FLEXIBLE SIGMOIDOSCOPY;  Surgeon: Laurence Spates, MD;  Location: [place];  Service: Endoscopy;  Laterality: N/A;    SOCIAL HISTORY: Social History   Socioeconomic History   Marital status: Single    Spouse name: Not on file   Number of children: 0   Years of education: Not on file   Highest education level: Not on file  Occupational History   Not on file  Tobacco Use   Smoking status: Never   Smokeless tobacco: Never  Substance and Sexual Activity   Alcohol use: Yes    Comment: Social   Drug use: No   Sexual activity: Yes  Other Topics Concern   Not on file  Social History Narrative   Not on file   Social Determinants of Health    Financial Resource Strain: Not on file  Food Insecurity: Not on file  Transportation Needs: Not on file  Physical Activity: Not on file  Stress: Not on file  Social Connections: Not on file  Intimate Partner Violence: Not on file    FAMILY HISTORY: Family History  Problem Relation Age of Onset   Colon cancer Father     ALLERGIES:  has No Known Allergies.  MEDICATIONS:  Current Outpatient Medications  Medication Sig Dispense Refill   acetaminophen (TYLENOL) 325 MG tablet Take 325-650 mg by mouth every 6 (six) hours as needed for mild pain (or headaches).     cephALEXin (KEFLEX) 500 MG capsule Take 500 mg by mouth 2 (two) times daily.     Cholecalciferol (VITAMIN D3) 50 MCG (2000 UT) capsule Take 4,000 Units by mouth daily.     diphenhydrAMINE (BENADRYL) 25 mg capsule Take 25 mg by mouth every 6 (six) hours as needed.     iron polysaccharides (NIFEREX) 150 MG capsule Take 1 capsule (150 mg total) by mouth daily. 30 capsule 5   No current facility-administered medications for this visit.    REVIEW OF SYSTEMS:   Review of Systems  Constitutional:  Negative for chills, fever and weight loss.  Respiratory:  Negative for cough and shortness of breath.   Cardiovascular:  Negative for chest pain, palpitations and leg swelling.  Gastrointestinal:  Positive for diarrhea. Negative for abdominal pain, blood in stool, constipation, melena, nausea and vomiting.  Neurological:  Negative for dizziness and headaches.  Endo/Heme/Allergies:  Does not bruise/bleed easily.    PHYSICAL EXAMINATION: ECOG PERFORMANCE STATUS: 1 - Symptomatic but completely ambulatory  . Vitals:   09/27/20 1326  BP: 121/80  Pulse: 93  Resp: 18  Temp: 98.2 F (36.8 C)  SpO2: 100%   Filed Weights   09/27/20 1326  Weight: 153 lb (69.4 kg)   .Body mass index is 24.69 kg/m.  GENERAL:alert, in no acute distress and comfortable SKIN: no acute rashes, no significant lesions EYES: conjunctiva are pink  and non-injected, sclera anicteric OROPHARYNX: MMM, no exudates, no oropharyngeal erythema or ulceration NECK: supple, no JVD LYMPH:  no palpable lymphadenopathy in the cervical, axillary or inguinal regions LUNGS: clear to auscultation b/l with normal respiratory effort HEART: regular rate & rhythm ABDOMEN:  normoactive bowel sounds , non tender, not distended. Extremity: no pedal edema PSYCH: alert & oriented x 3 with fluent speech NEURO: no focal motor/sensory deficits  LABORATORY DATA:  I have reviewed the data as listed  . CBC Latest Ref Rng & Units 09/27/2020 07/29/2020 07/28/2020  WBC 4.0 - 10.5 K/uL 5.1 - 9.3  Hemoglobin 12.0 - 15.0 g/dL 12.0 8.0(L) 6.0(LL)  Hematocrit 36.0 - 46.0 % 36.6 27.6(L) 22.3(L)  Platelets 150 - 400 K/uL 298 - 463(H)    . CMP Latest Ref Rng & Units 07/29/2020 07/28/2020 07/26/2020  Glucose 70 - 99 mg/dL 82 94 88  BUN 6 - 20 mg/dL <5(L) <5(L) 5(L)  Creatinine 0.44 - 1.00 mg/dL 0.42(L) 0.47 0.56  Sodium 135 - 145 mmol/L 136 136 138  Potassium 3.5 - 5.1 mmol/L 2.9(L) 3.0(L) 2.7(LL)  Chloride 98 - 111 mmol/L 109 105 106  CO2 22 - 32 mmol/L 21(L) 24 23  Calcium 8.9 - 10.3 mg/dL 8.3(L) 8.8(L) 8.8(L)  Total Protein 6.5 - 8.1 g/dL 6.2(L) 6.7 7.6  Total Bilirubin 0.3 - 1.2 mg/dL 0.6 0.3 0.4  Alkaline Phos 38 - 126 U/L 40 46 51  AST 15 - 41 U/L 12(L) 13(L) 12(L)  ALT 0 - 44 U/L 11 9 6      RADIOGRAPHIC STUDIES: No images were reviewed during this visit.   ASSESSMENT & PLAN:   1) Iron deficiency anemia in pregnancy: -Received 2 units of pRBC and IV venofer x 4 doses, completed on 08/26/2020.  -Labs today show anemia has resolved with hemoglobin of 12.0, Ferritin 103 and Iron saturation of 32%.  -Recommend to continue to take oral iron supplementation daily -Patient is scheduled with labs and follow up visit with Dr. Avinash Maltos Limbo on 11/04/20 prior to her due date in September 2022.    All of the patients questions were answered with apparent satisfaction. The  patient knows to call the clinic with any problems, questions or concerns.  I have spent a total of 25 minutes minutes of face-to-face and non-face-to-face time, preparing to see the patient, obtaining and/or reviewing separately obtained history, performing a medically appropriate examination, counseling and educating the patient, documenting clinical information in the electronic health record, and care coordination.   Lincoln Brigham PA-C Hematology and Oncology [facility] [place] P: [phone]

## 2020-11-01 ENCOUNTER — Encounter: Payer: Self-pay | Admitting: Obstetrics & Gynecology

## 2020-11-02 ENCOUNTER — Ambulatory Visit (HOSPITAL_BASED_OUTPATIENT_CLINIC_OR_DEPARTMENT_OTHER): Payer: Medicaid Other | Admitting: Maternal & Fetal Medicine

## 2020-11-02 ENCOUNTER — Ambulatory Visit: Payer: Medicaid Other | Admitting: *Deleted

## 2020-11-02 ENCOUNTER — Ambulatory Visit: Payer: Medicaid Other | Attending: Obstetrics and Gynecology

## 2020-11-02 ENCOUNTER — Other Ambulatory Visit: Payer: Self-pay | Admitting: *Deleted

## 2020-11-02 ENCOUNTER — Other Ambulatory Visit: Payer: Self-pay

## 2020-11-02 ENCOUNTER — Other Ambulatory Visit: Payer: Self-pay | Admitting: Obstetrics and Gynecology

## 2020-11-02 VITALS — BP 128/80 | HR 95

## 2020-11-02 DIAGNOSIS — O365930 Maternal care for other known or suspected poor fetal growth, third trimester, not applicable or unspecified: Secondary | ICD-10-CM

## 2020-11-02 DIAGNOSIS — O09513 Supervision of elderly primigravida, third trimester: Secondary | ICD-10-CM | POA: Diagnosis present

## 2020-11-02 DIAGNOSIS — Z3A35 35 weeks gestation of pregnancy: Secondary | ICD-10-CM | POA: Insufficient documentation

## 2020-11-02 DIAGNOSIS — Z364 Encounter for antenatal screening for fetal growth retardation: Secondary | ICD-10-CM

## 2020-11-02 DIAGNOSIS — O09523 Supervision of elderly multigravida, third trimester: Secondary | ICD-10-CM | POA: Insufficient documentation

## 2020-11-02 NOTE — Progress Notes (Signed)
MFM Consultation  Monica Green is a 35 yo G1P0 who is here in consultation at 50 w 4 d with a new diagnosis of fetal growth restriction seen on an outside exam. She seen at the request of Monica Green CNM.  Her pregnancy is overall uncomplicated with exception of moderate anemia which is now resolved after IV iron infusions. She is under the care of a hematoloigist. She also has ulcerative colitis but she reports that during pregnancy is the best it has ever been.  She had a low risk NIPS and horizon.  Single intrauterine pregnancy was observed and detailed ultrasoud was performed given the above indications. Normal anatomy with measurements less than dates due to IUGR EFW 7th% with an AC of 15%. There is good fetal movement but subjectively low amniotic fluid volume (AFI 5.4 cm). Suboptimal views of the fetal anatomy were obtained secondary to fetal position and advanced gestation.  The UA Dopplers are normal without evidence of AEDF or REDF.  I discussed today's visit with a diagnosis of IUGR. I explained that the etiology includes placental insufficiency, chronic disease, infection, aneuploidy and other genetic syndromes. She has a low risk NIPS and neg horizon. Additional risk factors include anemia and ulcerative colitis. At this time I explained the diagnosis, evaluation and management to include on going fetal growth and weekly antenatal testing to include UA Dopplers. If the EFW < 3rd% or abnormal testing, I recommend delivery at 37 weeks otherwise if all is normal consider delivery at 39 weeks.   I reviewed with [name] that the amniotic fluid was objectively normal but subjectively low being 5.4 cm. She denies leakage of fluid. She reports she does drink a lot of water. I explained that FGR can be associated with oligohydraminos, howvever, the most common casue of oligohydramnios absent of PPROM is dehydration. Oligohydramnios in the setting of FGR is associated with poor  outcomes and we recommend delivery between 34-37 weeks in this case. Therefore I recommended [name] to hydrate with 1.5 to 2L of water per day and have her AFI evaluated on Friday at your offices.  In the mean time we have scheduled her to return in 1 week for UA Dopplers and BPP.  All questions answered  I spent 45 minutes with > 50% in face to face consultation.

## 2020-11-03 NOTE — Progress Notes (Signed)
HEMATOLOGY/ONCOLOGY CONSULTATION NOTE  Date of Service: 11/03/2020  Patient Care Team: Patient, No Pcp Per (Inactive) as PCP - General (General Practice)  CHIEF COMPLAINTS/PURPOSE OF CONSULTATION:  Severe Anemia in pregnancy  HISTORY OF PRESENTING ILLNESS:   Monica Green is a wonderful 35 y.o. female who has been referred to [place] by Donnel Saxon, CNM at [facility] for evaluation and management of chronic severe anemia. The pt reports that she is doing well overall.  The pt reports that she is currently [redacted] weeks pregnant. She was seen previously by Dr. Lindi Adie in November 2021 in the hospital for severe anemia due to her ulcerative colitis. The pt notes that she had an allergic reaction to the third and last blood transfusion reactive due to eye itching and hives over her face. She experienced no issues tolerating the Feraheme she received in the past. The pt notes her ulcerative colitis has not been bothering her recently. The pt notes she sees a GI at [place]. The pt notes that she has needed IV iron in the past. The pt notes no treatments for her ulcerative colitis that was diagnosed in 2017 due to feelings of drowsiness ad heaviness in the past. The pt has been monitoring her food intake. The pt notes that she has not been taking iron supplements in her prenatal, but denies any Iron supplementation by itself due to constipation. The pt notes she was experiencing heavy periods for full seven days in the past prior to getting pregnant. The pt notes that she has not been to her GI since becoming pregnant. The pt experiences regular bowel habits with no noticeable blood or diarrhea. The pt notes that she was recently given an antibiotic for a UTI. 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 The pt notes she has recently been experiencing ice cravings within the last two weeks.  The pt notes no other medical issues outside of her ulcerative colitis  or familial blood disorders. The pt notes only social use of alcohol and denies any smoking prior to pregnancy. The pt notes her paternal grandmother and mother have diverticulitis but notes no major medical issues that run within the family.  Lab results 07/12/2020 of CBC w/diff and CMP is as follows: all values are WNL except for Hgb of 6.6, RBC of 3.47, HCT of 23.6, MCV of 69, MCH of 19.0, MCHC of 27.7, RDW of 18.1. 07/12/2020 Vitamin D 25-Hydroxy of 22.3.  On review of systems, pt reports lightheadedness, dizziness, ice cravings, and denies acute bloody/black stools, abdominal pain, and any other symptoms.  INTERVAL HISTORY:  Monica Green is a wonderful 35 y.o. female who is here for f/u of severe iron deficiency anemia in pregnancy. The patient's last visit with [place] was on 07/26/2020.   Patient received 2 units of PRBCs soon after our visit in April. She received 3 doses of IV Venofer and notes that she tolerated these well without any issues.  She notes her energy levels are much better and she is not having much fatigue dizziness or lightheadedness.  Lab results today 11/04/2020 of CBC w/diff patient's hemoglobin is up to 12.7 from a previous level of 6 prior to transfusions and IV iron.  WBC counts and platelets within normal limits.  CMP -showed severe hypokalemia with a potassium of 2.4.  And she was recommended to go to the emergency room for this. Ferritin 29 Iron saturation 30% B12 -139 On review  of systems, pt reports no pica symptoms no nausea vomiting or diarrhea at this time.    MEDICAL HISTORY:  Past Medical History:  Diagnosis Date   Anemia    Ulcerative colitis ([place])     SURGICAL HISTORY: Past Surgical History:  Procedure Laterality Date   FLEXIBLE SIGMOIDOSCOPY N/A 03/23/2015   Procedure: FLEXIBLE SIGMOIDOSCOPY;  Surgeon: Laurence Spates, MD;  Location: [facility];  Service: Endoscopy;  Laterality: N/A;    SOCIAL HISTORY: Social History    Socioeconomic History   Marital status: Single    Spouse name: Not on file   Number of children: 0   Years of education: Not on file   Highest education level: Not on file  Occupational History   Not on file  Tobacco Use   Smoking status: Never   Smokeless tobacco: Never  Vaping Use   Vaping Use: Never used  Substance and Sexual Activity   Alcohol use: Not Currently    Comment: Social   Drug use: No   Sexual activity: Yes  Other Topics Concern   Not on file  Social History Narrative   Not on file   Social Determinants of Health   Financial Resource Strain: Not on file  Food Insecurity: Not on file  Transportation Needs: Not on file  Physical Activity: Not on file  Stress: Not on file  Social Connections: Not on file  Intimate Partner Violence: Not on file    FAMILY HISTORY: Family History  Problem Relation Age of Onset   Colon cancer Father     ALLERGIES:  has No Known Allergies.  MEDICATIONS:  Current Outpatient Medications  Medication Sig Dispense Refill   acetaminophen (TYLENOL) 325 MG tablet Take 325-650 mg by mouth every 6 (six) hours as needed for mild pain (or headaches).     cephALEXin (KEFLEX) 500 MG capsule Take 500 mg by mouth 2 (two) times daily. (Patient not taking: Reported on 11/02/2020)     Cholecalciferol (VITAMIN D3) 50 MCG (2000 UT) capsule Take 4,000 Units by mouth daily. (Patient not taking: Reported on 11/02/2020)     diphenhydrAMINE (BENADRYL) 25 mg capsule Take 25 mg by mouth every 6 (six) hours as needed. (Patient not taking: Reported on 11/02/2020)     iron polysaccharides (NIFEREX) 150 MG capsule Take 1 capsule (150 mg total) by mouth daily. (Patient not taking: Reported on 11/02/2020) 30 capsule 5   Prenatal Vit-Fe Fumarate-FA (PRENATAL MULTIVITAMIN) TABS tablet Take 1 tablet by mouth daily at 12 noon.     No current facility-administered medications for this visit.    REVIEW OF SYSTEMS:   10 Point review of Systems was done is negative  except as noted above.  PHYSICAL EXAMINATION: ECOG PERFORMANCE STATUS: 2 - Symptomatic, <50% confined to bed  . Vitals:   11/04/20 1438  BP: 129/86  Pulse: 80  Resp: 17  Temp: 98.2 F (36.8 C)  SpO2: 100%    Filed Weights   11/04/20 1438  Weight: 155 lb 14.4 oz (70.7 kg)    .Body mass index is 25.16 kg/m.  NAD GENERAL:alert, in no acute distress and comfortable SKIN: no acute rashes, no significant lesions EYES: conjunctiva are pink and non-injected, sclera anicteric OROPHARYNX: MMM, no exudates, no oropharyngeal erythema or ulceration NECK: supple, no JVD LYMPH:  no palpable lymphadenopathy in the cervical, axillary or inguinal regions LUNGS: clear to auscultation b/l with normal respiratory effort HEART: regular rate & rhythm ABDOMEN:  normoactive bowel sounds , non tender, not distended. Extremity: no  pedal edema PSYCH: alert & oriented x 3 with fluent speech NEURO: no focal motor/sensory deficits  LABORATORY DATA:  I have reviewed the data as listed  . CBC Latest Ref Rng & Units 11/05/2020 11/04/2020 09/27/2020  WBC 4.0 - 10.5 K/uL 5.4 5.6 5.1  Hemoglobin 12.0 - 15.0 g/dL 11.9(L) 12.7 12.0  Hematocrit 36.0 - 46.0 % 34.6(L) 36.8 36.6  Platelets 150 - 400 K/uL 238 235 298    . CMP Latest Ref Rng & Units 11/06/2020 11/06/2020 11/05/2020  Glucose 70 - 99 mg/dL 75 69(L) -  BUN 6 - 20 mg/dL <5(L) <5(L) -  Creatinine 0.44 - 1.00 mg/dL 0.43(L) 0.32(L) -  Sodium 135 - 145 mmol/L 136 141 -  Potassium 3.5 - 5.1 mmol/L 3.3(L) 2.5(LL) 2.4(LL)  Chloride 98 - 111 mmol/L 107 111 -  CO2 22 - 32 mmol/L 22 22 -  Calcium 8.9 - 10.3 mg/dL 8.9 8.4(L) -  Total Protein 6.5 - 8.1 g/dL - 5.4(L) -  Total Bilirubin 0.3 - 1.2 mg/dL - 0.8 -  Alkaline Phos 38 - 126 U/L - 80 -  AST 15 - 41 U/L - 35 -  ALT 0 - 44 U/L - 26 -   . Lab Results  Component Value Date   IRON 124 11/04/2020   TIBC 409 11/04/2020   IRONPCTSAT 30 11/04/2020   (Iron and TIBC)  Lab Results  Component Value  Date   FERRITIN 29 11/04/2020    B12 -- 139   RADIOGRAPHIC STUDIES: I have personally reviewed the radiological images as listed and agreed with the findings in the report.  ASSESSMENT & PLAN:   35 year old female currently at about [redacted] weeks gestation with  #1 Severe iron deficiency anemia due to increased iron demand and pregnancy and previous iron deficiency due to ulcerative colitis with chronic GI losses. Hemoglobin was 6 in April 2022 and is now much better at 12.7 after 2 units of PRBCs and IV iron.  #2 vitamin B12 deficiency-likely due to poor absorption from ulcerative colitis and from increased demand during pregnancy.  #3 severe hypokalemia - ?  GI losses versus poor p.o. intake versus other etiologies.  PLAN: -Discussed pt labwork today, 11/04/2020;  -CBC w/diff patient's hemoglobin is up to 12.7 from a previous level of 6 prior to transfusions and IV iron.  WBC counts and platelets within normal limits.  CMP -showed severe hypokalemia with a potassium of 2.4.  And she was recommended to go to the emergency room for this. Ferritin 29 Iron saturation 30% B12 -139  -She has been recommended to go to the maternal and childcare emergency room for management of her severe hypokalemia will likely need IV potassium replacement. -Recommended to increase oral potassium intake. -No indication for additional IV iron at this time. -Would recommend to continue iron polysaccharide 150 mg p.o. twice daily ongoing. -B12 2000 mcg sublingual daily to address B12 deficiency. -Continue prenatal vitamins -Continue close follow-up with OB/GYN team  FOLLOW UP: RTC with Dr Irene Limbo with labs in 5 months    All of the patients questions were answered with apparent satisfaction. The patient knows to call the clinic with any problems, questions or concerns.   The total time spent in the appointment was 20 minutes and more than 50% was on counseling and direct patient cares.    Sullivan Lone  MD [place] AAHIVMS [facility] Hematology/Oncology Physician [facility]  (Office):       [phone] (Work cell):  [phone] (Fax):  [phone]  11/03/2020 7:25 PM  I, Reinaldo Raddle, am acting as scribe for Dr. Sullivan Lone, MD  .I have reviewed the above documentation for accuracy and completeness, and I agree with the above. Brunetta Genera MD

## 2020-11-04 ENCOUNTER — Inpatient Hospital Stay (HOSPITAL_COMMUNITY)
Admission: AD | Admit: 2020-11-04 | Discharge: 2020-11-06 | DRG: 833 | Disposition: A | Discharge status: Home / Self Care | Payer: Medicaid Other | Attending: Obstetrics & Gynecology | Admitting: Obstetrics & Gynecology

## 2020-11-04 ENCOUNTER — Encounter (HOSPITAL_COMMUNITY): Payer: Self-pay | Admitting: Obstetrics & Gynecology

## 2020-11-04 ENCOUNTER — Other Ambulatory Visit: Payer: Self-pay

## 2020-11-04 ENCOUNTER — Inpatient Hospital Stay: Payer: Medicaid Other | Attending: Hematology and Oncology | Admitting: Hematology

## 2020-11-04 ENCOUNTER — Telehealth: Payer: Self-pay

## 2020-11-04 ENCOUNTER — Inpatient Hospital Stay: Payer: Medicaid Other

## 2020-11-04 ENCOUNTER — Other Ambulatory Visit: Payer: Medicaid Other

## 2020-11-04 VITALS — BP 129/86 | HR 80 | Temp 98.2°F | Resp 17 | Ht 66.0 in | Wt 155.9 lb

## 2020-11-04 DIAGNOSIS — E611 Iron deficiency: Secondary | ICD-10-CM | POA: Insufficient documentation

## 2020-11-04 DIAGNOSIS — Z20822 Contact with and (suspected) exposure to covid-19: Secondary | ICD-10-CM | POA: Diagnosis present

## 2020-11-04 DIAGNOSIS — O99283 Endocrine, nutritional and metabolic diseases complicating pregnancy, third trimester: Principal | ICD-10-CM | POA: Diagnosis present

## 2020-11-04 DIAGNOSIS — D508 Other iron deficiency anemias: Secondary | ICD-10-CM

## 2020-11-04 DIAGNOSIS — E876 Hypokalemia: Secondary | ICD-10-CM | POA: Diagnosis present

## 2020-11-04 DIAGNOSIS — O139 Gestational [pregnancy-induced] hypertension without significant proteinuria, unspecified trimester: Secondary | ICD-10-CM | POA: Diagnosis not present

## 2020-11-04 DIAGNOSIS — D509 Iron deficiency anemia, unspecified: Secondary | ICD-10-CM

## 2020-11-04 DIAGNOSIS — E538 Deficiency of other specified B group vitamins: Secondary | ICD-10-CM | POA: Diagnosis present

## 2020-11-04 DIAGNOSIS — Z79899 Other long term (current) drug therapy: Secondary | ICD-10-CM | POA: Insufficient documentation

## 2020-11-04 DIAGNOSIS — O09513 Supervision of elderly primigravida, third trimester: Secondary | ICD-10-CM

## 2020-11-04 DIAGNOSIS — O4100X0 Oligohydramnios, unspecified trimester, not applicable or unspecified: Secondary | ICD-10-CM

## 2020-11-04 DIAGNOSIS — O1403 Mild to moderate pre-eclampsia, third trimester: Secondary | ICD-10-CM | POA: Diagnosis present

## 2020-11-04 DIAGNOSIS — R42 Dizziness and giddiness: Secondary | ICD-10-CM | POA: Insufficient documentation

## 2020-11-04 DIAGNOSIS — O99820 Streptococcus B carrier state complicating pregnancy: Secondary | ICD-10-CM | POA: Diagnosis present

## 2020-11-04 DIAGNOSIS — O99012 Anemia complicating pregnancy, second trimester: Secondary | ICD-10-CM | POA: Insufficient documentation

## 2020-11-04 DIAGNOSIS — Z800 Family history of malignant neoplasm of digestive organs: Secondary | ICD-10-CM | POA: Insufficient documentation

## 2020-11-04 DIAGNOSIS — Z3A22 22 weeks gestation of pregnancy: Secondary | ICD-10-CM | POA: Insufficient documentation

## 2020-11-04 DIAGNOSIS — O99013 Anemia complicating pregnancy, third trimester: Secondary | ICD-10-CM | POA: Diagnosis present

## 2020-11-04 DIAGNOSIS — Z3A35 35 weeks gestation of pregnancy: Secondary | ICD-10-CM

## 2020-11-04 LAB — CMP (CANCER CENTER ONLY)
ALT: 24 U/L (ref 0–44)
AST: 35 U/L (ref 15–41)
Albumin: 2.8 g/dL — ABNORMAL LOW (ref 3.5–5.0)
Alkaline Phosphatase: 117 U/L (ref 38–126)
Anion gap: 11 (ref 5–15)
BUN: <4 mg/dL — ABNORMAL LOW (ref 6–20)
CO2: 24 mmol/L (ref 22–32)
Calcium: 9.1 mg/dL (ref 8.9–10.3)
Chloride: 107 mmol/L (ref 98–111)
Creatinine: 0.6 mg/dL (ref 0.44–1.00)
GFR, Estimated: >60 mL/min (ref >60)
Glucose, Bld: 126 mg/dL — ABNORMAL HIGH (ref 70–99)
Potassium: 2.4 mmol/L — CL (ref 3.5–5.1)
Sodium: 142 mmol/L (ref 135–145)
Total Bilirubin: 0.6 mg/dL (ref 0.3–1.2)
Total Protein: 6.8 g/dL (ref 6.5–8.1)

## 2020-11-04 LAB — CBC WITH DIFFERENTIAL (CANCER CENTER ONLY)
Abs Immature Granulocytes: 0.01 10*3/uL (ref 0.00–0.07)
Basophils Absolute: 0 10*3/uL (ref 0.0–0.1)
Basophils Relative: 0 %
Eosinophils Absolute: 0.1 10*3/uL (ref 0.0–0.5)
Eosinophils Relative: 2 %
HCT: 36.8 % (ref 36.0–46.0)
Hemoglobin: 12.7 g/dL (ref 12.0–15.0)
Immature Granulocytes: 0 %
Lymphocytes Relative: 21 %
Lymphs Abs: 1.2 10*3/uL (ref 0.7–4.0)
MCH: 29.5 pg (ref 26.0–34.0)
MCHC: 34.5 g/dL (ref 30.0–36.0)
MCV: 85.4 fL (ref 80.0–100.0)
Monocytes Absolute: 0.6 10*3/uL (ref 0.1–1.0)
Monocytes Relative: 11 %
Neutro Abs: 3.6 10*3/uL (ref 1.7–7.7)
Neutrophils Relative %: 66 %
Platelet Count: 235 10*3/uL (ref 150–400)
RBC: 4.31 MIL/uL (ref 3.87–5.11)
RDW: 16.9 % — ABNORMAL HIGH (ref 11.5–15.5)
WBC Count: 5.6 10*3/uL (ref 4.0–10.5)
nRBC: 0 % (ref 0.0–0.2)

## 2020-11-04 LAB — IRON AND TIBC
Iron: 124 ug/dL (ref 41–142)
Saturation Ratios: 30 % (ref 21–57)
TIBC: 409 ug/dL (ref 236–444)
UIBC: 285 ug/dL (ref 120–384)

## 2020-11-04 LAB — VITAMIN B12: Vitamin B-12: 139 pg/mL — ABNORMAL LOW (ref 180–914)

## 2020-11-04 LAB — FERRITIN: Ferritin: 29 ng/mL (ref 11–307)

## 2020-11-04 MED ORDER — DOCUSATE SODIUM 100 MG PO CAPS
100.0000 mg | ORAL_CAPSULE | Freq: Every day | ORAL | Status: DC
  Filled 2020-11-04: qty 1

## 2020-11-04 MED ORDER — ACETAMINOPHEN 325 MG PO TABS
650.0000 mg | ORAL_TABLET | ORAL | Status: DC | PRN
  Administered 2020-11-04: 650 mg via ORAL
  Filled 2020-11-04: qty 2

## 2020-11-04 MED ORDER — LACTATED RINGERS IV SOLN: INTRAVENOUS | Status: DC

## 2020-11-04 MED ORDER — ZOLPIDEM TARTRATE 5 MG PO TABS: 5.0000 mg | ORAL_TABLET | Freq: Every evening | ORAL | Status: DC | PRN

## 2020-11-04 MED ORDER — CALCIUM CARBONATE ANTACID 500 MG PO CHEW: 2.0000 | CHEWABLE_TABLET | ORAL | Status: DC | PRN

## 2020-11-04 MED ORDER — POLYSACCHARIDE IRON COMPLEX 150 MG PO CAPS: 150.0000 mg | ORAL_CAPSULE | Freq: Every day | ORAL | 5 refills | Status: DC

## 2020-11-04 MED ORDER — POTASSIUM CHLORIDE 10 MEQ/100ML IV SOLN
10.0000 meq | INTRAVENOUS | Status: AC
  Administered 2020-11-04 – 2020-11-05 (×6): 10 meq via INTRAVENOUS
  Filled 2020-11-04 (×6): qty 100

## 2020-11-04 MED ORDER — VITAMIN B-12 100 MCG PO TABS
50.0000 ug | ORAL_TABLET | Freq: Every day | ORAL | Status: DC
  Administered 2020-11-04 – 2020-11-06 (×3): 50 ug via ORAL
  Filled 2020-11-04 (×3): qty 1

## 2020-11-04 MED ORDER — PRENATAL MULTIVITAMIN CH
1.0000 | ORAL_TABLET | Freq: Every day | ORAL | Status: DC
  Administered 2020-11-05 – 2020-11-06 (×2): 1 via ORAL
  Filled 2020-11-04 (×2): qty 1

## 2020-11-04 NOTE — Telephone Encounter (Signed)
CRITICAL VALUE STICKER  CRITICAL VALUE: K+ 2.4  RECEIVER (on-site recipient of call): Marion Downer, RN  DATE & TIME NOTIFIED: 11/04/20 @ 1510  MESSENGER (representative from lab):  MD NOTIFIED: Annell Greening, RN for Dr Irene Limbo  TIME OF NOTIFICATION: [id number]  RESPONSE:

## 2020-11-04 NOTE — Progress Notes (Signed)
Contacted pt to let her know her K is 2.4. Told pt she needs to go to Maternity Admissions  as soon as possible per Dr Irene Limbo.  Pt acknowledged and agreed.

## 2020-11-04 NOTE — H&P (Signed)
Monica Green is a 35 y.o. female, G1P0000, IUP at 35.6 weeks, presenting for 24 observation for hypokalemia asymptomatic, admitting potassium 2.4, pt stable. H/O chronic anemia: 6.6 at NOB, referred to Hematology.  2 U PRBC, started IV Venofer couse 07/28/20 admitting hgb 12.7, normal Iron studies today, followed by hematology.  B-12 deficient 139 at admission. UC followed by GI no meds. Late entry to prenatal care. AMA neg panorama. GBS+. AFI subjective low 5.3 on 8/2. Pt endorse + Fm. Denies vaginal leakage. Denies vaginal bleeding. Denies feeling cxt's.   Patient Active Problem List   Diagnosis Date Noted   Hypokalemia 11/04/2020   Iron deficiency anemia 07/29/2020   Thrombocytosis 03/03/2020   Symptomatic anemia 02/17/2020   Ulcerative colitis ([place]) 04/08/2015   Ureterolithiasis 01/06/2015     Active Ambulatory Problems    Diagnosis Date Noted   Iron deficiency anemia 07/29/2020   Ureterolithiasis 01/06/2015   Ulcerative colitis ([place]) 04/08/2015   Symptomatic anemia 02/17/2020   Thrombocytosis 03/03/2020   Resolved Ambulatory Problems    Diagnosis Date Noted   Microcytic hypochromic anemia 01/05/2015   Symptomatic anemia 01/05/2015   Bacterial vaginosis 01/06/2015   AP (abdominal pain) 03/21/2015   Bloody diarrhea    Generalized abdominal pain    Hypokalemia    Rectal bleed 03/21/2015   Protein-calorie malnutrition, severe 03/22/2015   Past Medical History:  Diagnosis Date   Anemia       Medications Prior to Admission  Medication Sig Dispense Refill Last Dose   acetaminophen (TYLENOL) 325 MG tablet Take 325-650 mg by mouth every 6 (six) hours as needed for mild pain (or headaches).      iron polysaccharides (NIFEREX) 150 MG capsule Take 1 capsule (150 mg total) by mouth daily. 30 capsule 5    Prenatal Vit-Fe Fumarate-FA (PRENATAL MULTIVITAMIN) TABS tablet Take 1 tablet by mouth daily at 12 noon.       Past Medical History:  Diagnosis Date   Anemia     Ulcerative colitis ([place])      No current facility-administered medications on file prior to encounter.   Current Outpatient Medications on File Prior to Encounter  Medication Sig Dispense Refill   acetaminophen (TYLENOL) 325 MG tablet Take 325-650 mg by mouth every 6 (six) hours as needed for mild pain (or headaches).     iron polysaccharides (NIFEREX) 150 MG capsule Take 1 capsule (150 mg total) by mouth daily. 30 capsule 5   Prenatal Vit-Fe Fumarate-FA (PRENATAL MULTIVITAMIN) TABS tablet Take 1 tablet by mouth daily at 12 noon.       No Known Allergies   OB History     Gravida  1   Para      Term      Preterm      AB      Living         SAB      IAB      Ectopic      Multiple      Live Births             Past Medical History:  Diagnosis Date   Anemia    Ulcerative colitis ([place])    Past Surgical History:  Procedure Laterality Date   FLEXIBLE SIGMOIDOSCOPY N/A 03/23/2015   Procedure: FLEXIBLE SIGMOIDOSCOPY;  Surgeon: Laurence Spates, MD;  Location: [facility];  Service: Endoscopy;  Laterality: N/A;   Family History: family history includes Colon cancer in her father. Social History:  reports that she has  never smoked. She has never used smokeless tobacco. She reports previous alcohol use. She reports that she does not use drugs.  ROS:  Review of Systems  Constitutional: Negative.   HENT: Negative.    Eyes: Negative.   Respiratory: Negative.    Cardiovascular: Negative.   Gastrointestinal: Negative.   Genitourinary: Negative.   Musculoskeletal: Negative.   Skin: Negative.   Neurological: Negative.   Endo/Heme/Allergies: Negative.   Psychiatric/Behavioral: Negative.      Physical Exam: BP 135/87 (BP Location: Left Arm)   Pulse 83   Temp 98.4 F (36.9 C) (Oral)   Resp 16   SpO2 100%   Physical Exam Vitals and nursing note reviewed.  Constitutional:      Appearance: Normal appearance.  HENT:     Head: Normocephalic and atraumatic.      Nose: Nose normal.     Mouth/Throat:     Mouth: Mucous membranes are moist.  Eyes:     Conjunctiva/sclera: Conjunctivae normal.     Pupils: Pupils are equal, round, and reactive to light.  Cardiovascular:     Rate and Rhythm: Normal rate and regular rhythm.     Pulses: Normal pulses.     Heart sounds: Normal heart sounds.  Pulmonary:     Effort: Pulmonary effort is normal.     Breath sounds: Normal breath sounds.  Abdominal:     General: Bowel sounds are normal.  Genitourinary:    Comments: Deferred Musculoskeletal:        General: Normal range of motion.     Cervical back: Normal range of motion and neck supple.  Skin:    General: Skin is warm.     Capillary Refill: Capillary refill takes less than 2 seconds.  Neurological:     General: No focal deficit present.     Mental Status: She is alert.  Psychiatric:        Mood and Affect: Mood normal.     NST: FHR baseline 150 bpm, Variability: moderate, Accelerations:present, Decelerations:  Absent= Cat 1/Reactive UC:   OCC SVE:  Deferred  Labs: Results for orders placed or performed in visit on 11/04/20 (from the past 24 hour(s))  Vitamin B12     Status: Abnormal   Collection Time: 11/04/20  2:10 PM  Result Value Ref Range   Vitamin B-12 139 (L) 180 - 914 pg/mL  CMP ([facility] only)     Status: Abnormal   Collection Time: 11/04/20  2:10 PM  Result Value Ref Range   Sodium 142 135 - 145 mmol/L   Potassium 2.4 (LL) 3.5 - 5.1 mmol/L   Chloride 107 98 - 111 mmol/L   CO2 24 22 - 32 mmol/L   Glucose, Bld 126 (H) 70 - 99 mg/dL   BUN <4 (L) 6 - 20 mg/dL   Creatinine 0.60 0.44 - 1.00 mg/dL   Calcium 9.1 8.9 - 10.3 mg/dL   Total Protein 6.8 6.5 - 8.1 g/dL   Albumin 2.8 (L) 3.5 - 5.0 g/dL   AST 35 15 - 41 U/L   ALT 24 0 - 44 U/L   Alkaline Phosphatase 117 38 - 126 U/L   Total Bilirubin 0.6 0.3 - 1.2 mg/dL   GFR, Estimated >60 >60 mL/min   Anion gap 11 5 - 15  CBC with Differential ([facility] Only)     Status:  Abnormal   Collection Time: 11/04/20  2:10 PM  Result Value Ref Range   WBC Count 5.6 4.0 - 10.5 K/uL  RBC 4.31 3.87 - 5.11 MIL/uL   Hemoglobin 12.7 12.0 - 15.0 g/dL   HCT 36.8 36.0 - 46.0 %   MCV 85.4 80.0 - 100.0 fL   MCH 29.5 26.0 - 34.0 pg   MCHC 34.5 30.0 - 36.0 g/dL   RDW 16.9 (H) 11.5 - 15.5 %   Platelet Count 235 150 - 400 K/uL   nRBC 0.0 0.0 - 0.2 %   Neutrophils Relative % 66 %   Neutro Abs 3.6 1.7 - 7.7 K/uL   Lymphocytes Relative 21 %   Lymphs Abs 1.2 0.7 - 4.0 K/uL   Monocytes Relative 11 %   Monocytes Absolute 0.6 0.1 - 1.0 K/uL   Eosinophils Relative 2 %   Eosinophils Absolute 0.1 0.0 - 0.5 K/uL   Basophils Relative 0 %   Basophils Absolute 0.0 0.0 - 0.1 K/uL   Immature Granulocytes 0 %   Abs Immature Granulocytes 0.01 0.00 - 0.07 K/uL  Iron and TIBC     Status: None   Collection Time: 11/04/20  2:11 PM  Result Value Ref Range   Iron 124 41 - 142 ug/dL   TIBC 409 236 - 444 ug/dL   Saturation Ratios 30 21 - 57 %   UIBC 285 120 - 384 ug/dL  Ferritin     Status: None   Collection Time: 11/04/20  2:11 PM  Result Value Ref Range   Ferritin 29 11 - 307 ng/mL    Imaging:  [place] MFM FETAL BPP WO NON STRESS  Result Date: 11/02/2020 ----------------------------------------------------------------------  OBSTETRICS REPORT                       (Signed Final 11/02/2020 05:07 pm) ---------------------------------------------------------------------- Patient Info  ID #:       [id number]                          D.O.B.:  [birth year] (35 yrs)  Name:       Monica Green                       Visit Date: 11/02/2020 10:40 am              Green ---------------------------------------------------------------------- Performed By  Attending:        Sander Nephew      Ref. Address:     [facility]                    MD                                                             Obstetrics &                                                             Gynecology  [place]                                                             [place], [place]  Performed By:     Jeanene Erb BS,      Location:         [facility]                    RDMS                                     [facility] at                                                             [place] [facility]                                                             [facility]  Referred By:      Donnel Saxon                    CNM ---------------------------------------------------------------------- Orders  #  Description                           Code        Ordered By  1  [place] MFM OB DETAIL +14 WK               76811.01    VICKI LATHAM  2  [place] MFM FETAL BPP WO NON               [id number]    [place]  3  [place] MFM UA CORD  DOPPLER                [id number]    Donnel Saxon ----------------------------------------------------------------------  #  Order #                     Accession #                Episode #  1  [id number]                   [id number]                 [id number]  2  [id number]                   [id number]                 [id number]  3  [id number]                   [id number]                 [id number] ---------------------------------------------------------------------- Indications  Maternal care for known or suspected poor      O36.5930  fetal growth, third trimester, not applicable or  unspecified IUGR  [redacted] weeks gestation of pregnancy                Z3A.35  Encounter for antenatal screening for          Z36.3  malformations  Advanced maternal age primigravida 35+,        O65.513  third trimester  Low Risk NIPS(Negative Horizon)  Poor obstetrical history (History Severe       O09.299  Anemia)(Ulcerative Colitis)(Thrombocytosis)  ---------------------------------------------------------------------- Fetal Evaluation  Num Of Fetuses:         1  Fetal Heart Rate(bpm):  144  Cardiac Activity:       Observed  Presentation:           Cephalic  Placenta:               Posterior Fundal  P. Cord Insertion:      Not well visualized  Amniotic Fluid  AFI FV:      Subjectively low-normal  AFI Sum(cm)     %Tile       Largest Pocket(cm)  5.3             < 3         2.3  RUQ(cm)                     LUQ(cm)        LLQ(cm)  1.1                         1.9            2.3 ---------------------------------------------------------------------- Biophysical Evaluation  Amniotic F.V:   Pocket => 2 cm             F. Tone:        Observed  F. Movement:    Observed                   Score:          8/8  F. Breathing:   Observed ---------------------------------------------------------------------- Biometry  BPD:      84.4  mm     G. Age:  34w 0d         14  %  CI:        77.09   %    70 - 86                                                          FL/HC:      20.5   %    20.1 - 22.1  HC:      304.4  mm     G. Age:  33w 6d        1.8  %    HC/AC:      1.02        0.93 - 1.11  AC:      299.7  mm     G. Age:  34w 0d         15  %    FL/BPD:     73.8   %    71 - 87  FL:       62.3  mm     G. Age:  32w 2d        < 1  %    FL/AC:      20.8   %    20 - 24  Est. FW:    2202  gm    4 lb 14 oz       7  % ---------------------------------------------------------------------- OB History  Gravidity:    1         Term:   0        Prem:   0        SAB:   0  TOP:          0       Ectopic:  0        Living: 0 ---------------------------------------------------------------------- Gestational Age  LMP:           57w 5d        Date:  01/29/20                 EDD:   11/04/20  U/S Today:     33w 4d                                        EDD:   12/17/20  Best:          35w 4d     Det. By:  Previous Ultrasound      EDD:   12/03/20  ---------------------------------------------------------------------- Anatomy  Cranium:               Appears normal         Aortic Arch:            Not well visualized  Cavum:                 Appears normal         Ductal Arch:            Not well visualized  Ventricles:            Appears normal         Diaphragm:              Appears normal  Choroid Plexus:  Appears normal         Stomach:                Appears normal, left                                                                        sided  Cerebellum:            Not well visualized    Abdomen:                Appears normal  Posterior Fossa:       Not well visualized    Abdominal Wall:         Not well visualized  Nuchal Fold:           Not applicable ([id number]    Cord Vessels:           Appears normal ([redacted]                         wks GA)                                        vessel cord)  Face:                  Profile appears        Kidneys:                Appear normal                         normal  Lips:                  Appears normal         Bladder:                Appears normal  Thoracic:              Appears normal         Spine:                  Not well visualized  Heart:                 Appears normal         Upper Extremities:      Not well visualized                         (4CH, axis, and                         situs)  RVOT:                  Not well visualized    Lower Extremities:      Not well visualized  LVOT:                  Appears normal  Other:  Technically difficult due to advanced GA and fetal position. ---------------------------------------------------------------------- Doppler - Fetal Vessels  Umbilical Artery   S/D     %  tile      RI    %tile      PI    %tile            ADFV    RDFV   2.61       61    0.62       69    0.91       66               No      No ---------------------------------------------------------------------- Cervix Uterus Adnexa  Cervix  Not visualized (advanced GA >24wks)  ---------------------------------------------------------------------- Comments  Monica Green is a 35 yo G1P0 who is here in  consultation at 9 w 4 d with a new diagnosis of fetal growth  restriction seen on an outside exam. She seen at the request  of Donnel Saxon CNM.  Her pregnancy is overall uncomplicated with exception of  moderate anemia which is now resolved after IV iron  infusions. She is under the care of a hematoloigist. She also  has ulcerative colitis but she reports that during pregnancy is  the best it has ever been.  She had a low risk NIPS and horizon.  Single intrauterine pregnancy was observed and detailed  ultrasoud was performed given the above indications.  Normal anatomy with measurements less than dates due to  IUGR EFW 7th% with an AC of 15%.  There is good fetal movement but subjectively low amniotic  fluid volume (AFI 5.4 cm).  Suboptimal views of the fetal anatomy were obtained  secondary to fetal position and advanced gestation.  The UA Dopplers are normal without evidence of AEDF or  REDF.  I discussed today's visit with a diagnosis of IUGR. I explained  that the etiology includes placental insufficiency, chronic  disease, infection, aneuploidy and other genetic syndromes.  She has a low risk NIPS and neg horizon. Additional risk  factors include anemia and ulcerative colitis. At this time I  explained the diagnosis, evaluation and management to  include on going fetal growth and weekly antenatal testing to  include UA Dopplers. If the EFW < 3rd% or abnormal testing,  I recommend delivery at 37 weeks otherwise if all is normal  consider delivery at 39 weeks.  I reviewed with [name] that the amniotic fluid  was objectively normal but subjectively low being 5.4 cm. She  denies leakage of fluid. She reports she does drink a lot of  water. I explained that FGR can be associated with  oligohydraminos, howvever, the most common casue of  oligohydramnios absent of PPROM is  dehydration.  Oligohydramnios in the setting of FGR is associated with  poor outcomes and we recommend delivery between 34-37  weeks in this case. Therefore I recommended Ms.  Lezotte to hydrate with 1.5 to 2L of water per day and  have her AFI evaluated on Friday at your offices.  In the mean time we have scheduled her to return in 1 week  for UA Dopplers and BPP.  All questions answered  I spent 60 minutes with > 50% in face to face consultation. ----------------------------------------------------------------------               Sander Nephew, MD Electronically Signed Final Report   11/02/2020 05:07 pm ----------------------------------------------------------------------  [place] MFM OB DETAIL +14 WK  Result Date: 11/02/2020 ----------------------------------------------------------------------  OBSTETRICS REPORT                       (  Signed Final 11/02/2020 05:07 pm) ---------------------------------------------------------------------- Patient Info  ID #:       [id number]                          D.O.B.:  1985/11/21 (35 yrs)  Name:       Monica Green                       Visit Date: 11/02/2020 10:40 am              [place] ---------------------------------------------------------------------- Performed By  Attending:        Sander Nephew      Ref. Address:     [facility]                    MD                                                             Obstetrics &                                                             Gynecology                                                             [place]                                                             [place], [place]  Performed By:     Jeanene Erb BS,      Location:  [facility]                    RDMS                                      Fetal Care at                                                             [place] for                                                             Women  Referred By:      Donnel Saxon                    CNM ---------------------------------------------------------------------- Orders  #  Description                           Code        Ordered By  1  [place] MFM OB DETAIL +14 [place]               76811.01    [place]  2  [place] MFM FETAL BPP WO NON               76819.01    [place]  3  [place] MFM UA CORD DOPPLER                76820.02    VICKI LATHAM ----------------------------------------------------------------------  #  Order #                     Accession #                Episode #  1  [id number]                   [id number]                 [id number]  2  [id number]                   [id number]                 [id number]  3  [id number]                   [id number]                 [id number] ---------------------------------------------------------------------- Indications  Maternal care for known or suspected poor      O36.5930  fetal growth, third trimester, not applicable or  unspecified IUGR  [redacted] weeks gestation of pregnancy                Z3A.35  Encounter for antenatal screening for          Z36.3  malformations  Advanced maternal age primigravida 42+,        O35.513  third trimester  Low Risk NIPS(Negative Horizon)  Poor obstetrical history (History Severe       O09.299  Anemia)(Ulcerative Colitis)(Thrombocytosis) ---------------------------------------------------------------------- Fetal Evaluation  Num Of Fetuses:         1  Fetal Heart Rate(bpm):  144  Cardiac Activity:       Observed  Presentation:           Cephalic  Placenta:               Posterior Fundal  P. Cord Insertion:      Not well visualized  Amniotic Fluid  AFI FV:      Subjectively low-normal  AFI Sum(cm)     %Tile       Largest Pocket(cm)  5.3             < 3         2.3  RUQ(cm)                     LUQ(cm)         LLQ(cm)  1.1                         1.9            2.3 ---------------------------------------------------------------------- Biophysical Evaluation  Amniotic F.V:   Pocket => 2 cm             F. Tone:        Observed  F. Movement:    Observed                   Score:          8/8  F. Breathing:   Observed ---------------------------------------------------------------------- Biometry  BPD:      84.4  mm     G. Age:  34w 0d         14  %    CI:        77.09   %    70 - 86                                                          FL/HC:      20.5   %    20.1 - 22.1  HC:      304.4  mm     G. Age:  33w 6d        1.8  %    HC/AC:      1.02        0.93 - 1.11  AC:      299.7  mm     G. Age:  34w 0d         15  %    FL/BPD:     73.8   %    71 - 87  FL:       62.3  mm     G. Age:  32w 2d        < 1  %    FL/AC:      20.8   %    20 - 24  Est. FW:    2202  gm    4 lb 14 oz       7  % ---------------------------------------------------------------------- OB History  Gravidity:  1         Term:   0        Prem:   0        SAB:   0  TOP:          0       Ectopic:  0        Living: 0 ---------------------------------------------------------------------- Gestational Age  LMP:           46w 5d        Date:  01/29/20                 EDD:   11/04/20  U/S Today:     33w 4d                                        EDD:   12/17/20  Best:          35w 4d     Det. By:  Previous Ultrasound      EDD:   12/03/20 ---------------------------------------------------------------------- Anatomy  Cranium:               Appears normal         Aortic Arch:            Not well visualized  Cavum:                 Appears normal         Ductal Arch:            Not well visualized  Ventricles:            Appears normal         Diaphragm:              Appears normal  Choroid Plexus:        Appears normal         Stomach:                Appears normal, left                                                                        sided  Cerebellum:             Not well visualized    Abdomen:                Appears normal  Posterior Fossa:       Not well visualized    Abdominal Wall:         Not well visualized  Nuchal Fold:           Not applicable (Q000111Q    Cord Vessels:           Appears normal ([redacted]                         wks GA)                                        vessel cord)  Face:                  Profile appears        Kidneys:                Appear normal                         normal  Lips:                  Appears normal         Bladder:                Appears normal  Thoracic:              Appears normal         Spine:                  Not well visualized  Heart:                 Appears normal         Upper Extremities:      Not well visualized                         (4CH, axis, and                         situs)  RVOT:                  Not well visualized    Lower Extremities:      Not well visualized  LVOT:                  Appears normal  Other:  Technically difficult due to advanced GA and fetal position. ---------------------------------------------------------------------- Doppler - Fetal Vessels  Umbilical Artery   S/D     %tile      RI    %tile      PI    %tile            ADFV    RDFV   2.61       61    0.62       69    0.91       66               No      No ---------------------------------------------------------------------- Cervix Uterus Adnexa  Cervix  Not visualized (advanced GA >24wks) ---------------------------------------------------------------------- Comments  Monica Green is a 35 yo G1P0 who is here in  consultation at 55 w 4 d with a new diagnosis of fetal growth  restriction seen on an outside exam. She seen at the request  of Donnel Saxon CNM.  Her pregnancy is overall uncomplicated with exception of  moderate anemia which is now resolved after IV iron  infusions. She is under the care of a hematoloigist. She also  has ulcerative colitis but she reports that during pregnancy is  the best it has ever been.  She had a low risk  NIPS and horizon.  Single intrauterine pregnancy was observed and detailed  ultrasoud was performed given the above indications.  Normal anatomy with measurements less than dates due to  IUGR EFW 7th% with an AC of 15%.  There is good fetal movement but subjectively low amniotic  fluid volume (AFI 5.4 cm).  Suboptimal views of the fetal anatomy  were obtained  secondary to fetal position and advanced gestation.  The UA Dopplers are normal without evidence of AEDF or  REDF.  I discussed today's visit with a diagnosis of IUGR. I explained  that the etiology includes placental insufficiency, chronic  disease, infection, aneuploidy and other genetic syndromes.  She has a low risk NIPS and neg horizon. Additional risk  factors include anemia and ulcerative colitis. At this time I  explained the diagnosis, evaluation and management to  include on going fetal growth and weekly antenatal testing to  include UA Dopplers. If the EFW < 3rd% or abnormal testing,  I recommend delivery at 37 weeks otherwise if all is normal  consider delivery at 39 weeks.  I reviewed with [name] that the amniotic fluid  was objectively normal but subjectively low being 5.4 cm. She  denies leakage of fluid. She reports she does drink a lot of  water. I explained that FGR can be associated with  oligohydraminos, howvever, the most common casue of  oligohydramnios absent of PPROM is dehydration.  Oligohydramnios in the setting of FGR is associated with  poor outcomes and we recommend delivery between 34-37  weeks in this case. Therefore I recommended Ms.  Bralley to hydrate with 1.5 to 2L of water per day and  have her AFI evaluated on Friday at your offices.  In the mean time we have scheduled her to return in 1 week  for UA Dopplers and BPP.  All questions answered  I spent 60 minutes with > 50% in face to face consultation. ----------------------------------------------------------------------               Sander Nephew, MD  Electronically Signed Final Report   11/02/2020 05:07 pm ----------------------------------------------------------------------  [place] MFM UA CORD DOPPLER  Result Date: 11/02/2020 ----------------------------------------------------------------------  OBSTETRICS REPORT                       (Signed Final 11/02/2020 05:07 pm) ---------------------------------------------------------------------- Patient Info  ID #:       [id number]                          D.O.B.:  October 14, 1985 (35 yrs)  Name:       Monica Green                       Visit Date: 11/02/2020 10:40 am              Green ---------------------------------------------------------------------- Performed By  Attending:        Sander Nephew      Ref. Address:     [facility]                    MD                                                             Obstetrics &  Gynecology                                                             [place]                                                             Echo, Donaldson  Performed By:     Jeanene Erb BS,      Location:         [facility]                    RDMS                                     [facility]                                                             [place] [facility]                                                             [facility]  Referred By:      Donnel Saxon                    CNM ---------------------------------------------------------------------- Orders  #  Description                           Code        Ordered By  1  [place] MFM OB DETAIL +14 [place]  [id number]    [place]  2  [place] MFM FETAL BPP WO NON               76819.01    VICKI LATHAM     STRESS  3  [place] MFM UA CORD DOPPLER                 76820.02    Donnel Saxon ----------------------------------------------------------------------  #  Order #                     Accession #                Episode #  1  [id number]                   [id number]                 [id number]  2  [id number]                   [id number]                 [id number]  3  [id number]                   [id number]                 [id number] ---------------------------------------------------------------------- Indications  Maternal care for known or suspected poor      O36.5930  fetal growth, third trimester, not applicable or  unspecified IUGR  [redacted] weeks gestation of pregnancy                Z3A.35  Encounter for antenatal screening for          Z36.3  malformations  Advanced maternal age primigravida 12+,        O4.513  third trimester  Low Risk NIPS(Negative Horizon)  Poor obstetrical history (History Severe       O09.299  Anemia)(Ulcerative Colitis)(Thrombocytosis) ---------------------------------------------------------------------- Fetal Evaluation  Num Of Fetuses:         1  Fetal Heart Rate(bpm):  144  Cardiac Activity:       Observed  Presentation:           Cephalic  Placenta:               Posterior Fundal  P. Cord Insertion:      Not well visualized  Amniotic Fluid  AFI FV:      Subjectively low-normal  AFI Sum(cm)     %Tile       Largest Pocket(cm)  5.3             < 3         2.3  RUQ(cm)                     LUQ(cm)        LLQ(cm)  1.1                         1.9            2.3 ---------------------------------------------------------------------- Biophysical Evaluation  Amniotic F.V:   Pocket => 2 cm             F. Tone:        Observed  F. Movement:    Observed                   Score:  8/8  F. Breathing:   Observed ---------------------------------------------------------------------- Biometry  BPD:      84.4  mm     G. Age:  34w 0d         14  %    CI:        77.09   %    70 - 86                                                          FL/HC:      20.5   %     20.1 - 22.1  HC:      304.4  mm     G. Age:  33w 6d        1.8  %    HC/AC:      1.02        0.93 - 1.11  AC:      299.7  mm     G. Age:  34w 0d         15  %    FL/BPD:     73.8   %    71 - 87  FL:       62.3  mm     G. Age:  32w 2d        < 1  %    FL/AC:      20.8   %    20 - 24  Est. FW:    2202  gm    4 lb 14 oz       7  % ---------------------------------------------------------------------- OB History  Gravidity:    1         Term:   0        Prem:   0        SAB:   0  TOP:          0       Ectopic:  0        Living: 0 ---------------------------------------------------------------------- Gestational Age  LMP:           62w 5d        Date:  01/29/20                 EDD:   11/04/20  U/S Today:     33w 4d                                        EDD:   12/17/20  Best:          35w 4d     Det. By:  Previous Ultrasound      EDD:   12/03/20 ---------------------------------------------------------------------- Anatomy  Cranium:               Appears normal         Aortic Arch:            Not well visualized  Cavum:                 Appears normal         Ductal Arch:            Not well visualized  Ventricles:  Appears normal         Diaphragm:              Appears normal  Choroid Plexus:        Appears normal         Stomach:                Appears normal, left                                                                        sided  Cerebellum:            Not well visualized    Abdomen:                Appears normal  Posterior Fossa:       Not well visualized    Abdominal Wall:         Not well visualized  Nuchal Fold:           Not applicable ([id number]    Cord Vessels:           Appears normal ([redacted]                         wks GA)                                        vessel cord)  Face:                  Profile appears        Kidneys:                Appear normal                         normal  Lips:                  Appears normal         Bladder:                Appears normal  Thoracic:               Appears normal         Spine:                  Not well visualized  Heart:                 Appears normal         Upper Extremities:      Not well visualized                         (4CH, axis, and                         situs)  RVOT:                  Not well visualized    Lower Extremities:      Not well visualized  LVOT:  Appears normal  Other:  Technically difficult due to advanced GA and fetal position. ---------------------------------------------------------------------- Doppler - Fetal Vessels  Umbilical Artery   S/D     %tile      RI    %tile      PI    %tile            ADFV    RDFV   2.61       61    0.62       69    0.91       66               No      No ---------------------------------------------------------------------- Cervix Uterus Adnexa  Cervix  Not visualized (advanced GA >24wks) ---------------------------------------------------------------------- Comments  Monica Green is a 35 yo G1P0 who is here in  consultation at 24 w 4 d with a new diagnosis of fetal growth  restriction seen on an outside exam. She seen at the request  of Donnel Saxon CNM.  Her pregnancy is overall uncomplicated with exception of  moderate anemia which is now resolved after IV iron  infusions. She is under the care of a hematoloigist. She also  has ulcerative colitis but she reports that during pregnancy is  the best it has ever been.  She had a low risk NIPS and horizon.  Single intrauterine pregnancy was observed and detailed  ultrasoud was performed given the above indications.  Normal anatomy with measurements less than dates due to  IUGR EFW 7th% with an AC of 15%.  There is good fetal movement but subjectively low amniotic  fluid volume (AFI 5.4 cm).  Suboptimal views of the fetal anatomy were obtained  secondary to fetal position and advanced gestation.  The UA Dopplers are normal without evidence of AEDF or  REDF.  I discussed today's visit with a diagnosis of IUGR. I explained  that the  etiology includes placental insufficiency, chronic  disease, infection, aneuploidy and other genetic syndromes.  She has a low risk NIPS and neg horizon. Additional risk  factors include anemia and ulcerative colitis. At this time I  explained the diagnosis, evaluation and management to  include on going fetal growth and weekly antenatal testing to  include UA Dopplers. If the EFW < 3rd% or abnormal testing,  I recommend delivery at 37 weeks otherwise if all is normal  consider delivery at 39 weeks.  I reviewed with [name] that the amniotic fluid  was objectively normal but subjectively low being 5.4 cm. She  denies leakage of fluid. She reports she does drink a lot of  water. I explained that FGR can be associated with  oligohydraminos, howvever, the most common casue of  oligohydramnios absent of PPROM is dehydration.  Oligohydramnios in the setting of FGR is associated with  poor outcomes and we recommend delivery between 34-37  weeks in this case. Therefore I recommended Ms.  Dodge to hydrate with 1.5 to 2L of water per day and  have her AFI evaluated on Friday at your offices.  In the mean time we have scheduled her to return in 1 week  for UA Dopplers and BPP.  All questions answered  I spent 60 minutes with > 50% in face to face consultation. ----------------------------------------------------------------------               Sander Nephew, MD Electronically Signed Final Report   11/02/2020 05:07 pm ----------------------------------------------------------------------   MAU Course: Orders Placed This Encounter  Procedures   Diet regular Room service appropriate? Yes; Fluid consistency: Thin   Notify physician (specify)   Vital signs   Defer vaginal exam for vaginal bleeding or PROM <37 weeks   Initiate Oral Care Protocol   Initiate Carrier Fluid Protocol   Activity as tolerated   Cardiac monitoring   Full code   Fetal nonstress test   Place in observation (patient's  expected length of stay will be less than 2 midnights)   Meds ordered this encounter  Medications   acetaminophen (TYLENOL) tablet 650 mg   zolpidem (AMBIEN) tablet 5 mg   docusate sodium (COLACE) capsule 100 mg   calcium carbonate (TUMS - dosed in mg elemental calcium) chewable tablet 400 mg of elemental calcium   prenatal multivitamin tablet 1 tablet   vitamin B-12 (CYANOCOBALAMIN) tablet 50 mcg   lactated ringers infusion   potassium chloride 10 mEq in 100 mL IVPB    Assessment/Plan: REVECA HOWZE is a 35 y.o. female, G1P0000, IUP at 35.6 weeks, presenting for 24 observation for hypokalemia asymptomatic, admitting potassium 2.4, pt stable. H/O chronic anemia: 6.6 at NOB, referred to Hematology.  2 U PRBC, started IV Venofer couse 07/28/20 admitting hgb 12.7, normal Iron studies today, followed by hematology.  B-12 deficient 139 at admission. UC followed by GI no meds. Late entry to prenatal care. AMA neg panorama. GBS+. AFI subjective low 5.3 on 8/2.   FWB: Cat 1 Fetal Tracing with Reactive NST/ .   Plan: Admit to Antenatal per consult with Dr Alwyn Pea Routine Antenatal [facility] orders Regular diet Replace IV 67mq over 1 hours x 6 doses Repeat cmp Telemetry Qshift NST B-12 50 mcg daily  JBattle Creek [facility]oscopy And Surgery CenterNP-C, CNM, MSN 11/04/2020, 7:42 PM

## 2020-11-05 ENCOUNTER — Observation Stay (HOSPITAL_BASED_OUTPATIENT_CLINIC_OR_DEPARTMENT_OTHER): Payer: Medicaid Other

## 2020-11-05 DIAGNOSIS — O09513 Supervision of elderly primigravida, third trimester: Secondary | ICD-10-CM

## 2020-11-05 DIAGNOSIS — O99013 Anemia complicating pregnancy, third trimester: Secondary | ICD-10-CM | POA: Diagnosis present

## 2020-11-05 DIAGNOSIS — K5190 Ulcerative colitis, unspecified, without complications: Secondary | ICD-10-CM | POA: Diagnosis not present

## 2020-11-05 DIAGNOSIS — O09293 Supervision of pregnancy with other poor reproductive or obstetric history, third trimester: Secondary | ICD-10-CM | POA: Diagnosis not present

## 2020-11-05 DIAGNOSIS — Z3A36 36 weeks gestation of pregnancy: Secondary | ICD-10-CM

## 2020-11-05 DIAGNOSIS — O139 Gestational [pregnancy-induced] hypertension without significant proteinuria, unspecified trimester: Secondary | ICD-10-CM | POA: Diagnosis not present

## 2020-11-05 DIAGNOSIS — O133 Gestational [pregnancy-induced] hypertension without significant proteinuria, third trimester: Secondary | ICD-10-CM | POA: Diagnosis not present

## 2020-11-05 DIAGNOSIS — O365930 Maternal care for other known or suspected poor fetal growth, third trimester, not applicable or unspecified: Secondary | ICD-10-CM | POA: Diagnosis not present

## 2020-11-05 DIAGNOSIS — E538 Deficiency of other specified B group vitamins: Secondary | ICD-10-CM | POA: Diagnosis present

## 2020-11-05 DIAGNOSIS — Z3A35 35 weeks gestation of pregnancy: Secondary | ICD-10-CM | POA: Diagnosis not present

## 2020-11-05 DIAGNOSIS — D509 Iron deficiency anemia, unspecified: Secondary | ICD-10-CM | POA: Diagnosis present

## 2020-11-05 DIAGNOSIS — E876 Hypokalemia: Secondary | ICD-10-CM | POA: Diagnosis present

## 2020-11-05 DIAGNOSIS — Z20822 Contact with and (suspected) exposure to covid-19: Secondary | ICD-10-CM | POA: Diagnosis present

## 2020-11-05 DIAGNOSIS — O99820 Streptococcus B carrier state complicating pregnancy: Secondary | ICD-10-CM | POA: Diagnosis present

## 2020-11-05 DIAGNOSIS — O1403 Mild to moderate pre-eclampsia, third trimester: Secondary | ICD-10-CM | POA: Diagnosis present

## 2020-11-05 DIAGNOSIS — O99283 Endocrine, nutritional and metabolic diseases complicating pregnancy, third trimester: Secondary | ICD-10-CM | POA: Diagnosis present

## 2020-11-05 LAB — CBC WITH DIFFERENTIAL/PLATELET
Abs Immature Granulocytes: 0.02 10*3/uL (ref 0.00–0.07)
Basophils Absolute: 0 10*3/uL (ref 0.0–0.1)
Basophils Relative: 1 %
Eosinophils Absolute: 0.1 10*3/uL (ref 0.0–0.5)
Eosinophils Relative: 3 %
HCT: 34.6 % — ABNORMAL LOW (ref 36.0–46.0)
Hemoglobin: 11.9 g/dL — ABNORMAL LOW (ref 12.0–15.0)
Immature Granulocytes: 0 %
Lymphocytes Relative: 24 %
Lymphs Abs: 1.3 10*3/uL (ref 0.7–4.0)
MCH: 29.9 pg (ref 26.0–34.0)
MCHC: 34.4 g/dL (ref 30.0–36.0)
MCV: 86.9 fL (ref 80.0–100.0)
Monocytes Absolute: 0.6 10*3/uL (ref 0.1–1.0)
Monocytes Relative: 12 %
Neutro Abs: 3.2 10*3/uL (ref 1.7–7.7)
Neutrophils Relative %: 60 %
Platelets: 238 10*3/uL (ref 150–400)
RBC: 3.98 MIL/uL (ref 3.87–5.11)
RDW: 16.6 % — ABNORMAL HIGH (ref 11.5–15.5)
WBC: 5.4 10*3/uL (ref 4.0–10.5)
nRBC: 0 % (ref 0.0–0.2)

## 2020-11-05 LAB — COMPREHENSIVE METABOLIC PANEL
ALT: 26 U/L (ref 0–44)
AST: 36 U/L (ref 15–41)
Albumin: 2.4 g/dL — ABNORMAL LOW (ref 3.5–5.0)
Alkaline Phosphatase: 90 U/L (ref 38–126)
Anion gap: 7 (ref 5–15)
BUN: <5 mg/dL — ABNORMAL LOW (ref 6–20)
CO2: 25 mmol/L (ref 22–32)
Calcium: 8.4 mg/dL — ABNORMAL LOW (ref 8.9–10.3)
Chloride: 106 mmol/L (ref 98–111)
Creatinine, Ser: 0.45 mg/dL (ref 0.44–1.00)
GFR, Estimated: >60 mL/min (ref >60)
Glucose, Bld: 81 mg/dL (ref 70–99)
Potassium: 2.1 mmol/L — CL (ref 3.5–5.1)
Sodium: 138 mmol/L (ref 135–145)
Total Bilirubin: 1 mg/dL (ref 0.3–1.2)
Total Protein: 5.7 g/dL — ABNORMAL LOW (ref 6.5–8.1)

## 2020-11-05 LAB — LACTATE DEHYDROGENASE: LDH: 207 U/L — ABNORMAL HIGH (ref 98–192)

## 2020-11-05 LAB — SARS CORONAVIRUS 2 (TAT 6-24 HRS): SARS Coronavirus 2: NEGATIVE

## 2020-11-05 LAB — NA AND K (SODIUM & POTASSIUM), RAND UR
Potassium Urine: 6 mmol/L
Sodium, Ur: 18 mmol/L

## 2020-11-05 LAB — CREATININE, URINE, RANDOM: Creatinine, Urine: 17.07 mg/dL

## 2020-11-05 LAB — PROTEIN / CREATININE RATIO, URINE
Creatinine, Urine: 17.02 mg/dL
Protein Creatinine Ratio: 0.41 mg/mg{Cre} — ABNORMAL HIGH (ref 0.00–0.15)
Total Protein, Urine: 7 mg/dL

## 2020-11-05 LAB — POTASSIUM: Potassium: 2.4 mmol/L — CL (ref 3.5–5.1)

## 2020-11-05 LAB — URIC ACID: Uric Acid, Serum: 4 mg/dL (ref 2.5–7.1)

## 2020-11-05 LAB — MAGNESIUM: Magnesium: 1.9 mg/dL (ref 1.7–2.4)

## 2020-11-05 MED ORDER — BETAMETHASONE SOD PHOS & ACET 6 (3-3) MG/ML IJ SUSP
12.0000 mg | INTRAMUSCULAR | Status: DC
Start: 2020-11-05 — End: 2020-11-07
  Filled 2020-11-05: qty 5

## 2020-11-05 MED ORDER — POTASSIUM CHLORIDE 10 MEQ/100ML IV SOLN: 10.0000 meq | INTRAVENOUS | Status: DC

## 2020-11-05 MED ORDER — POTASSIUM CHLORIDE 2 MEQ/ML IV SOLN: INTRAVENOUS | Status: DC

## 2020-11-05 MED ORDER — CALCIUM GLUCONATE-NACL 1-0.675 GM/50ML-% IV SOLN
1.0000 g | Freq: Once | INTRAVENOUS | Status: DC
Start: 2020-11-05 — End: 2020-11-05

## 2020-11-05 MED ORDER — POTASSIUM CHLORIDE 2 MEQ/ML IV SOLN
INTRAVENOUS | Status: AC
  Filled 2020-11-05 (×2): qty 1000

## 2020-11-05 MED ORDER — VITAMIN D 25 MCG (1000 UNIT) PO TABS
4000.0000 [IU] | ORAL_TABLET | Freq: Every day | ORAL | Status: DC
  Administered 2020-11-05 – 2020-11-06 (×2): 4000 [IU] via ORAL
  Filled 2020-11-05 (×2): qty 4

## 2020-11-05 MED ORDER — POTASSIUM CHLORIDE CRYS ER 20 MEQ PO TBCR
40.0000 meq | EXTENDED_RELEASE_TABLET | ORAL | Status: AC
  Administered 2020-11-05: 40 meq via ORAL
  Filled 2020-11-05: qty 2

## 2020-11-05 NOTE — Progress Notes (Addendum)
Hospital day # 1 pregnancy at [redacted]w[redacted]d-Celesta C Nigg is a 35y.o. female, G1P0000, IUP at 35.6 weeks, presenting for 24 observation for hypokalemia asymptomatic, admitting potassium 2.4, pt stable. H/O chronic anemia: 6.6 at NOB, referred to Hematology.  2 U PRBC, started IV Venofer couse 07/28/20 admitting hgb 12.7, normal Iron studies today, followed by hematology.  B-12 deficient 139 at admission. UC followed by GI no meds. Late entry to prenatal care. AMA neg panorama. GBS+. AFI subjective low 5.3 on 8/2. Pt endorse + Fm. Denies vaginal leakage. Denies vaginal bleeding. Denies feeling cxt's. .  Patient Active Problem List   Diagnosis Date Noted   Gestational hypertension 11/05/2020   Hypokalemia 11/04/2020   Iron deficiency anemia 07/29/2020   Thrombocytosis 03/03/2020   Symptomatic anemia 02/17/2020   [place]ative [place] ([place] 04/08/2015   Ureterolithiasis 01/06/2015     Active Ambulatory Problems    Diagnosis Date Noted   Iron deficiency anemia 07/29/2020   Ureterolithiasis 01/06/2015   Ulcerative colitis ([place] 04/08/2015   Symptomatic anemia 02/17/2020   Thrombocytosis 03/03/2020   Resolved Ambulatory Problems    Diagnosis Date Noted   Microcytic hypochromic anemia 01/05/2015   Symptomatic anemia 01/05/2015   Bacterial vaginosis 01/06/2015   AP (abdominal pain) 03/21/2015   Bloody diarrhea    Generalized abdominal pain    Hypokalemia    Rectal bleed 03/21/2015   Protein-calorie malnutrition, severe 03/22/2015   Past Medical History:  Diagnosis Date   Anemia     S:  Pt stable and teary eyed in room, pt denies HA, RUQ pain or vision changes, pt was newly dx today with preeclampsia without severe features, pt became teared eyed, and worried about not being ready for the baby to come. Pt had family support mother visit today. Pt endorses + FM, denies s/sx of hypokalemia.       Perception of contractions: none      Vaginal bleeding: none now       Vaginal discharge:   no significant change  O: BP (!) 143/84 (BP Location: Right Arm)   Pulse 77   Temp 97.8 F (36.6 C) (Oral)   Resp 18   Ht '5\' 5"'$  (1.651 m)   Wt 70.3 kg   SpO2 100%   BMI 25.79 kg/m  Physical Exam Vitals and nursing note reviewed. Constitutional:      Appearance: Normal appearance. HENT:    Head: Normocephalic and atraumatic.    Nose: Nose normal.    Mouth/Throat:    Mouth: Mucous membranes are moist. Eyes:    Conjunctiva/sclera: Conjunctivae normal.    Pupils: Pupils are equal, round, and reactive to light. Cardiovascular:    Rate and Rhythm: Normal rate and regular rhythm.    Pulses: Normal pulses.    Heart sounds: Normal heart sounds. Pulmonary:    Effort: Pulmonary effort is normal.    Breath sounds: Normal breath sounds. Abdominal:    General: Bowel sounds are normal. Genitourinary:    Comments: Deferred Musculoskeletal:        General: Normal range of motion.    Cervical back: Normal range of motion and neck supple. Skin:    General: Skin is warm.    Capillary Refill: Capillary refill takes less than 2 seconds. Neurological:    General: No focal deficit present.    Mental Status: She is alert. Psychiatric:        Mood and Affect: Mood normal.     NST: FHR baseline 150 bpm, Variability: moderate, Accelerations:present, Decelerations:  Absent= Cat 1/Reactive UC:   OCC SVE:  Deferred   A/P: Hospital day # 1 pregnancy at [redacted]w[redacted]d-Jannel C Petras is a 35y.o. female, G1P0000, IUP at 35.6 weeks, presenting for 24 observation for hypokalemia asymptomatic, admitting potassium 2.4, pt stable. H/O chronic anemia: 6.6 at NOB, referred to Hematology.  2 U PRBC, started IV Venofer couse 07/28/20 admitting hgb 12.7, normal Iron studies today, followed by hematology.  B-12 deficient 139 at admission. UC followed by GI no meds. Late entry to prenatal care. AMA neg panorama. GBS+. AFI subjective low 5.3 on 8/2. Pt endorse + Fm. Denies vaginal leakage. Denies vaginal  bleeding. Denies feeling cxt's.  [redacted] weeks pregnant new dx preeclampsia without severe features: Patient normally reports having low blood pressures and is [redacted] weeks pregnant with her first child.  Blood pressures initially elevated up to 148/92. BP currently 129/78. Asymptomatic. PCR 0.47. Other labs unremarkable. Plan for IOL at 37 weeks, f/u with MFM, report s/sx or worsening BP or neuro s/sx, [facility] BP and ROB on Monday.    Hypokalemia: Acute.  Patient initially presented with potassium 2.4 and had been given 60 mEq of potassium chloride IV, but repeat check this morning 2.1, received another bag of 459m with 40 PO, repeat 2.4. Mag level checked WNL 1.7, potassium and creatinine urine was WNL 18/6. Aldosterone and renin pending. Reports having 3 stools per day on average and unchanged.  Suspect most likely related to gastrointestinal losses given history of ulcerative colitis. Plan to continue another bag of Change IV fluids to lactated Ringer's with potassium 40 mEq and 125 mL/h to be given a total of 2 L per hospitalist and repeat potassium in morning. Hospitalist will follow tomorrow.     History of ulcerative colitis: Patient reports being managed without need of medications.  Last flare reported back in 2017.   Anemia: Patient with history of anemia, but hemoglobin was overall improved baseline.  Hemoglobin 12.7-> 11.9 g/dL on repeat check today. Iron studies WNL upon admission.  No reports of bleeding.  FWB: Nst reactive today, stable, dx subjectively low fluids on 8/2 AFI 5.3 BPP 8/8, wnl doppler, EFW 7% dx with IUGR, repeat [place], BPP 8/8, AFI 11.7, plan to f/u with MFM on 8/9 for repeat BPP and doppler.   Ja[facility] CountyNM, MSN 11/05/2020 2:46 PM MD addendum:  I saw and examined patient at bedside and agree with above findings, assessment and plan as outlined above by JaMadison Parish HospitalNM, MSN.I reviewed NST today  at 1420 which showed baseline 140, minimal to moderate variability, reactive.   TOCO: No contractions.   EmWaymon AmatoMD. 11/05/2020 2250.

## 2020-11-05 NOTE — Progress Notes (Deleted)
Hospital day # 1 pregnancy at [redacted]w[redacted]d-Monica Green is a 35y.o. female, G1P0000, IUP at 35.6 weeks, presenting for 24 observation for hypokalemia asymptomatic, admitting potassium 2.4, pt stable. H/O chronic anemia: 6.6 at NOB, referred to Hematology.  2 U PRBC, started IV Venofer couse 07/28/20 admitting hgb 12.7, normal Iron studies today, followed by hematology.  B-12 deficient 139 at admission. UC followed by GI no meds. Late entry to prenatal care. AMA neg panorama. GBS+. AFI subjective low 5.3 on 8/2. Pt endorse + Fm. Denies vaginal leakage. Denies vaginal bleeding. Denies feeling cxt's. .  Patient Active Problem List   Diagnosis Date Noted   Gestational hypertension 11/05/2020   Hypokalemia 11/04/2020   Iron deficiency anemia 07/29/2020   Thrombocytosis 03/03/2020   Symptomatic anemia 02/17/2020   Ulcerative colitis ([place] 04/08/2015   Ureterolithiasis 01/06/2015     Active Ambulatory Problems    Diagnosis Date Noted   Iron deficiency anemia 07/29/2020   Ureterolithiasis 01/06/2015   Ulcerative colitis ([place] 04/08/2015   Symptomatic anemia 02/17/2020   Thrombocytosis 03/03/2020   Resolved Ambulatory Problems    Diagnosis Date Noted   Microcytic hypochromic anemia 01/05/2015   Symptomatic anemia 01/05/2015   Bacterial vaginosis 01/06/2015   AP (abdominal pain) 03/21/2015   Bloody diarrhea    Generalized abdominal pain    Hypokalemia    Rectal bleed 03/21/2015   Protein-calorie malnutrition, severe 03/22/2015   Past Medical History:  Diagnosis Date   Anemia     S:  Pt stable and teary eyed in room, pt denies HA, RUQ pain or vision changes, pt was newly dx today with preeclampsia without severe features, pt became teared eyed, and worried about not being ready for the baby to come. Pt had family support mother visit today. Pt endorses + FM, denies s/sx of hypokalemia.       Perception of contractions: none      Vaginal bleeding: none now       Vaginal discharge:   no significant change  O: BP 129/78 (BP Location: Right Arm)   Pulse 81   Temp 98 F (36.7 C) (Oral)   Resp 19   Ht '5\' 5"'$  (1.651 m)   Wt 70.3 kg   SpO2 100%   BMI 25.79 kg/m  Physical Exam Vitals and nursing note reviewed. Constitutional:      Appearance: Normal appearance. HENT:    Head: Normocephalic and atraumatic.    Nose: Nose normal.    Mouth/Throat:    Mouth: Mucous membranes are moist. Eyes:    Conjunctiva/sclera: Conjunctivae normal.    Pupils: Pupils are equal, round, and reactive to light. Cardiovascular:    Rate and Rhythm: Normal rate and regular rhythm.    Pulses: Normal pulses.    Heart sounds: Normal heart sounds. Pulmonary:    Effort: Pulmonary effort is normal.    Breath sounds: Normal breath sounds. Abdominal:    General: Bowel sounds are normal. Genitourinary:    Comments: Deferred Musculoskeletal:        General: Normal range of motion.    Cervical back: Normal range of motion and neck supple. Skin:    General: Skin is warm.    Capillary Refill: Capillary refill takes less than 2 seconds. Neurological:    General: No focal deficit present.    Mental Status: She is alert. Psychiatric:        Mood and Affect: Mood normal.     NST: FHR baseline 150 bpm, Variability: moderate, Accelerations:present, Decelerations:  Absent=  Cat 1/Reactive UC:   OCC SVE:  Deferred   A/P: Hospital day # 1 pregnancy at [redacted]w[redacted]d-Monica Green is a 35y.o. female, G1P0000, IUP at 35.6 weeks, presenting for 24 observation for hypokalemia asymptomatic, admitting potassium 2.4, pt stable. H/O chronic anemia: 6.6 at NOB, referred to Hematology.  2 U PRBC, started IV Venofer couse 07/28/20 admitting hgb 12.7, normal Iron studies today, followed by hematology.  B-12 deficient 139 at admission. UC followed by GI no meds. Late entry to prenatal care. AMA neg panorama. GBS+. AFI subjective low 5.3 on 8/2. Pt endorse + Fm. Denies vaginal leakage. Denies vaginal bleeding.  Denies feeling cxt's.  [redacted] weeks pregnant new dx preeclampsia without severe features: Patient normally reports having low blood pressures and is [redacted] weeks pregnant with her first child.  Blood pressures initially elevated up to 148/92. BP currently 129/78. Asymptomatic. PCR 0.47. Other labs unremarkable. Plan for IOL at 37 weeks, f/u with MFM, report s/sx or worsening BP or neuro s/sx, [facility] BP and ROB on Monday.    Hypokalemia: Acute.  Patient initially presented with potassium 2.4 and had been given 60 mEq of potassium chloride IV, but repeat check this morning 2.1, received another bag of 466m with 40 PO, repeat 2.4. Mag level checked WNL 1.7, potassium and creatinine urine was WNL 18/6. Aldosterone and renin pending. Reports having 3 stools per day on average and unchanged.  Suspect most likely related to gastrointestinal losses given history of ulcerative colitis. Plan to continue another bag of Change IV fluids to lactated Ringer's with potassium 40 mEq and 125 mL/h to be given a total of 2 L per hospitalist and repeat potassium in morning. Hospitalist will follow tomorrow.     History of ulcerative colitis: Patient reports being managed without need of medications.  Last flare reported back in 2017.   Anemia: Patient with history of anemia, but hemoglobin was overall improved baseline.  Hemoglobin 12.7-> 11.9 g/dL on repeat check today. Iron studies WNL upon admission.  No reports of bleeding.  FWB: Nst reactive today, stable, dx subjectively low fluids on 8/2 AFI 5.3 BPP 8/8, wnl doppler, EFW 7% dx with IUGR, repeat [place], BPP 8/8, AFI 11.7, plan to f/u with MFM on 8/9 for repeat BPP and doppler.   JaS[facility] Medical CorporationNM, MSN 11/05/2020 2:46 PM MD addendum:  I saw and examined patient at bedside and agree with above findings, assessment and plan as outlined above by JaBrynn [facility]r HospitalNM, MSN.I reviewed NST today  at 1420 which showed baseline 140, minimal to moderate variability, reactive.  TOCO: No  contractions.   EmWaymon AmatoMD. 11/05/2020 2250.

## 2020-11-05 NOTE — Consult Note (Signed)
[facility] Medical Consultation  Monica Green [id number] DOB: December 26, 1985 DOA: 11/04/2020 PCP: Patient, No Pcp Per (Inactive)   Requesting physician: Sanjuana Kava, MD Date of consultation: 11/05/2020 Reason for consultation: Hypokalemia  Impression/Recommendations Active Problems:   Hypokalemia   Gestational hypertension    [redacted] weeks pregnant gestational hypertension: Patient normally reports having low blood pressures and is 35.[redacted] weeks pregnant with her first child.  Blood pressures initially elevated up to 148/92. -Per OB/GYN  Hypokalemia: Acute.  Patient initially presented with potassium 2.4 and had been given 60 mEq of potassium chloride IV, but repeat check this morning 2.1.  Reports having 3 stools per day on average and unchanged.  Suspect most likely related to gastrointestinal losses given history of ulcerative colitis.  -Check magnesium -Check urine potassium and creatinine -Check renin and aldosterone -Change IV fluids to lactated Ringer's with potassium 40 mEq and 125 mL/h to be given a total of 2 L -Give oral potassium chloride 40 mEq p.o. x1 dose -Continue to monitor and replace as needed  History of ulcerative colitis: Patient reports being managed without need of medications.  Last flare reported back in 2017.  Anemia: Patient with history of anemia, but hemoglobin was overall improved baseline.  Hemoglobin 12.7-> 11.9 g/dL on repeat check today.  No reports of bleeding  I will followup again tomorrow. Please contact me if I can be of assistance in the meanwhile. Thank you for this consultation.  Chief Complaint: Abnormal lab  HPI:  Monica Green is a 35 y.o. female 35.[redacted] weeks pregnant  with medical history significant of anemia, ulcerative colitis, and hypokalemia presents with after being advised by Dr. Irene Limbo to come in for abnormal potassium of 2.4.  She reports that she had gone to hematology for routine follow-up.  Notes hemoglobin had  dropped down to 6.6 during this pregnancy requiring transfusion of 2 units of PRBCs back in April/May of this year.  She reports that her potassium is always low, but she has never been placed on any daily supplement.  Her ulcerative colitis is being managed without need of medications.  She reports having approximately 3 nonbloody stools per day on average.  Denied having any nausea, vomiting, or fever recently.  Normally patient also reports that her blood pressures are low, but since being in the hospital patient blood pressures elevated up to 148/92.  Repeat labs this morning reveal potassium 2.1 despite patient being given 60 mEq of potassium chloride IV.  TRH was called to formally evaluate.  Patient denies any uterine contractions, muscle cramps, or vaginal leakage.  She has been trying to eat foods that are high in sodium at baseline  Review of Systems  Constitutional:  Negative for fever.  Eyes:  Negative for photophobia and pain.  Respiratory:  Negative for cough.   Cardiovascular:  Positive for leg swelling (With pregnancy). Negative for chest pain.  Gastrointestinal:  Positive for diarrhea (Nonbloody). Negative for nausea and vomiting.  Genitourinary:  Negative for dysuria and hematuria.  Musculoskeletal:  Negative for falls.  Neurological:  Negative for focal weakness and loss of consciousness.  All other systems reviewed and are negative.   Past Medical History:  Diagnosis Date   Anemia    Ulcerative colitis ([place])    Past Surgical History:  Procedure Laterality Date   FLEXIBLE SIGMOIDOSCOPY N/A 03/23/2015   Procedure: FLEXIBLE SIGMOIDOSCOPY;  Surgeon: Laurence Spates, MD;  Location: [place];  Service: Endoscopy;  Laterality: N/A;   Social History:  reports that  she has never smoked. She has never used smokeless tobacco. She reports previous alcohol use. She reports that she does not use drugs.  No Known Allergies Family History  Problem Relation Age of Onset   Colon  cancer Father     Prior to Admission medications   Medication Sig Start Date End Date Taking? Authorizing Provider  acetaminophen (TYLENOL) 325 MG tablet Take 325-650 mg by mouth every 6 (six) hours as needed for mild pain (or headaches).    [provider]  iron polysaccharides (NIFEREX) 150 MG capsule Take 1 capsule (150 mg total) by mouth daily. 11/04/20   Brunetta Genera, MD  Prenatal Vit-Fe Fumarate-FA (PRENATAL MULTIVITAMIN) TABS tablet Take 1 tablet by mouth daily at 12 noon.    [provider]   Physical Exam:  Constitutional: Young female currently NAD, calm, comfortable Vitals:   11/04/20 2005 11/04/20 2325 11/05/20 0353 11/05/20 0757  BP:  (!) 138/100 (!) 139/98 135/80  Pulse:  74 75 80  Resp:  '18 18 18  '$ Temp:  98 F (36.7 C) 97.9 F (36.6 C) (!) 97.5 F (36.4 C)  TempSrc:  Oral Oral Oral  SpO2: 100% 100% 100% 100%  Weight:      Height:       Eyes: PERRL, lids and conjunctivae normal ENMT: Mucous membranes are moist. Posterior pharynx clear of any exudate or lesions.Normal dentition.  Neck: normal, supple, no masses, no thyromegaly Respiratory: clear to auscultation bilaterally, no wheezing, no crackles. Normal respiratory effort. No accessory muscle use.  Cardiovascular: Regular rate and rhythm, no murmurs / rubs / gallops.  Trace lower extremity edema. 2+ pedal pulses. No carotid bruits.  Abdomen: Gravid abdomen, no masses palpated. No hepatosplenomegaly. Bowel sounds positive.  Musculoskeletal: no clubbing / cyanosis. No joint deformity upper and lower extremities. Good ROM, no contractures. Normal muscle tone.  Skin: no rashes, lesions, ulcers. No induration Neurologic: CN 2-12 grossly intact. Sensation intact, DTR normal. Strength 5/5 in all 4.  Psychiatric: Normal judgment and insight. Alert and oriented x 3.  Anxious mood.   Labs on Admission:  Basic Metabolic Panel: Recent Labs  Lab 11/04/20 1410 11/05/20 0514  NA 142 138  K 2.4*  2.1*  CL 107 106  CO2 24 25  GLUCOSE 126* 81  BUN <4* <5*  CREATININE 0.60 0.45  CALCIUM 9.1 8.4*   Liver Function Tests: Recent Labs  Lab 11/04/20 1410 11/05/20 0514  AST 35 36  ALT 24 26  ALKPHOS 117 90  BILITOT 0.6 1.0  PROT 6.8 5.7*  ALBUMIN 2.8* 2.4*   No results for input(s): LIPASE, AMYLASE in the last 168 hours. No results for input(s): AMMONIA in the last 168 hours. CBC: Recent Labs  Lab 11/04/20 1410  WBC 5.6  NEUTROABS 3.6  HGB 12.7  HCT 36.8  MCV 85.4  PLT 235   Cardiac Enzymes: No results for input(s): CKTOTAL, CKMB, CKMBINDEX, TROPONINI in the last 168 hours. BNP: Invalid input(s): POCBNP CBG: No results for input(s): GLUCAP in the last 168 hours.  Radiological Exams on Admission: No results found.  Lab/previous admissions: Independently reviewed.  Evaluate for possible causes of hypokalemia, but previously noted to be likely secondary to gastrointestinal losses.  Time spent: >45 minutes  [facility]   If 7PM-7AM, please contact night-coverage

## 2020-11-06 DIAGNOSIS — E876 Hypokalemia: Secondary | ICD-10-CM | POA: Diagnosis not present

## 2020-11-06 LAB — CULTURE, OB URINE: Culture: 2000 — AB

## 2020-11-06 LAB — COMPREHENSIVE METABOLIC PANEL
ALT: 26 U/L (ref 0–44)
AST: 35 U/L (ref 15–41)
Albumin: 2.2 g/dL — ABNORMAL LOW (ref 3.5–5.0)
Alkaline Phosphatase: 80 U/L (ref 38–126)
Anion gap: 8 (ref 5–15)
BUN: <5 mg/dL — ABNORMAL LOW (ref 6–20)
CO2: 22 mmol/L (ref 22–32)
Calcium: 8.4 mg/dL — ABNORMAL LOW (ref 8.9–10.3)
Chloride: 111 mmol/L (ref 98–111)
Creatinine, Ser: 0.32 mg/dL — ABNORMAL LOW (ref 0.44–1.00)
GFR, Estimated: >60 mL/min (ref >60)
Glucose, Bld: 69 mg/dL — ABNORMAL LOW (ref 70–99)
Potassium: 2.5 mmol/L — CL (ref 3.5–5.1)
Sodium: 141 mmol/L (ref 135–145)
Total Bilirubin: 0.8 mg/dL (ref 0.3–1.2)
Total Protein: 5.4 g/dL — ABNORMAL LOW (ref 6.5–8.1)

## 2020-11-06 MED ORDER — POTASSIUM CHLORIDE CRYS ER 20 MEQ PO TBCR: 40.0000 meq | EXTENDED_RELEASE_TABLET | Freq: Three times a day (TID) | ORAL | 1 refills | Status: DC

## 2020-11-06 MED ORDER — POTASSIUM CHLORIDE 10 MEQ/100ML IV SOLN
10.0000 meq | INTRAVENOUS | Status: AC
Start: 2020-11-06 — End: 2020-11-06
  Administered 2020-11-06 (×4): 10 meq via INTRAVENOUS
  Filled 2020-11-06 (×4): qty 100

## 2020-11-06 MED ORDER — KCL-LACTATED RINGERS 20 MEQ/L IV SOLN
INTRAVENOUS | Status: DC
  Filled 2020-11-06: qty 1000

## 2020-11-06 MED ORDER — POTASSIUM CHLORIDE 2 MEQ/ML IV SOLN
INTRAVENOUS | Status: DC
  Filled 2020-11-06: qty 1000

## 2020-11-06 MED ORDER — POTASSIUM CHLORIDE CRYS ER 20 MEQ PO TBCR
40.0000 meq | EXTENDED_RELEASE_TABLET | Freq: Three times a day (TID) | ORAL | Status: DC
  Administered 2020-11-06 (×3): 40 meq via ORAL
  Filled 2020-11-06 (×3): qty 2

## 2020-11-06 MED ORDER — LACTATED RINGERS IV SOLN: INTRAVENOUS | Status: DC

## 2020-11-06 NOTE — Discharge Summary (Signed)
Physician Discharge Summary  Patient ID: NYASHIA Green MRN: [id number] DOB/AGE: 07-11-1985 35 y.o.  Admit date: 11/04/2020 Discharge date: 11/06/2020  Admission Diagnoses: hypokalemia, preeclampsia without severe features  Discharge Diagnoses:  Hypokalemia Preeclampsia without severe features  Discharged Condition: good  Hospital Course: Monica Green is a 35 y.o. female 36.1 weeks, HD#2 with medical history significant of anemia, ulcerative colitis, and hypokalemia presents with after being advised by Dr. Irene Limbo to come in for abnormal potassium of 2.4.  She reports that she had gone to hematology for routine follow-up.  Notes hemoglobin had dropped down to 6.6 during this pregnancy requiring transfusion of 2 units of PRBCs back in April/May of this year.  She reports that her potassium is always low, but she has never been placed on any daily supplement.  Her ulcerative colitis is being managed without need of medications.  She reports having approximately 3 nonbloody, non-mucousy stools per day on average.  Denied having any nausea, vomiting, or fever recently.  Normally patient also reports that her blood pressures are low, but since being in the hospital patient blood pressures elevated up to 148/92.  Repeat labs this morning reveal potassium 2.1 despite patient being given 60 mEq of potassium chloride IV.  TRH was called to formally evaluate.  Patient denies any uterine contractions, muscle cramps, or vaginal leakage.  She has been trying to eat foods that are high in sodium at baseline  Patient is status post IV p.o. repletion. 11/05/2020  Potassium is up to 2.5.  Her magnesium was not found to be low.  Urine potassium creatinine and sodium were all within normal range and an appropriate ratios. Continues to suspect this is GI loss related as there is no evidence of an RTA, however aldosterone and renin levels are still pending.  Aggressive repletion today with 40 p.o. 3 times  daily 40 mill equivalents IV, successful. Potassium level now 3.3. Cleared for DC with Hospitalists criteria and will continue oral supplement outpatient  Consulted with Dr. Alesia Richards on preeclampsia without severe features outpatient management. Orders to allow pt to DC home. She has strict hypertension precautions with support person in room to reiterate. She will keep her appt in office as scheduled Monday 8/8. She has a scheduled appt with MFM Wednesday 8/10. Note to scheduler for IOL at 37.0 Friday 8/12 at 0000.  Consults:  Hospitalist group    Discharge Exam: Blood pressure 137/83, pulse 84, temperature 98.2 F (36.8 C), temperature source Oral, resp. rate 18, height '5\' 5"'$  (1.651 m), weight 70.3 kg, SpO2 100 %. General appearance: alert, cooperative, appears stated age, and no distress Head: Normocephalic, without obvious abnormality, atraumatic Eyes: conjunctivae/corneas clear. PERRL, EOM's intact. Fundi benign. Ears: normal TM's and external ear canals both ears and wnl Nose: Nares normal. Septum midline. Mucosa normal. No drainage or sinus tenderness. Throat: lips, mucosa, and tongue normal; teeth and gums normal Neck: supple, symmetrical, trachea midline Resp: clear to auscultation bilaterally Cardio: regular rate and rhythm GI: soft, non-tender; bowel sounds normal; no masses,  no organomegaly Extremities: extremities normal, atraumatic, no cyanosis or edema and Homans sign is negative, no sign of DVT Pulses: 2+ and symmetric wnl Skin: Skin color, texture, turgor normal. No rashes or lesions Neurologic: Grossly normal  Cat I FHR tracing HR 140s, mod variability, accels present, decels absent Reactive NST   Disposition: Discharge disposition: 01-Home or Self Care       Discharge Instructions     Discharge diet:  No restrictions  Complete by: As directed    Discharge instructions   Complete by: As directed    Follow up for ROB as scheduled Monday. Follow up with  MFM Weds as scheduled. IOL at 37.0 weeks. Strict hypertension precautions given.   Fetal Kick Count:  Lie on our left side for one hour after a meal, and count the number of times your baby kicks.  If it is less than 5 times, get up, move around and drink some juice.  Repeat the test 30 minutes later.  If it is still less than 5 kicks in an hour, notify your doctor.   Complete by: As directed       Allergies as of 11/06/2020   No Known Allergies      Medication List     STOP taking these medications    acetaminophen 325 MG tablet Commonly known as: TYLENOL   iron polysaccharides 150 MG capsule Commonly known as: NIFEREX   prenatal multivitamin Tabs tablet       TAKE these medications    potassium chloride SA 20 MEQ tablet Commonly known as: KLOR-CON Take 2 tablets (40 mEq total) by mouth 3 (three) times daily.        Follow-up Information     Ob/Gyn, [facility] Follow up.   Specialty: Obstetrics and Gynecology Why: as scheduled Monday Contact information: [address] [address] [phone]                 Signed: Marveen Reeks Jaimin Krupka 11/06/2020, 8:44 PM

## 2020-11-06 NOTE — Progress Notes (Signed)
Patient ID: Monica Green, female   DOB: 11/24/1985, 35 y.o.   MRN: [id number]  PROGRESS NOTE    Monica Green  [id number] DOB: June 09, 1985 DOA: 11/04/2020 PCP: Patient, No Pcp Per (Inactive)    Brief Narrative:  Monica Green is a 35 y.o. female 35.[redacted] weeks pregnant  with medical history significant of anemia, ulcerative colitis, and hypokalemia presents with after being advised by Dr. Irene Limbo to come in for abnormal potassium of 2.4.  She reports that she had gone to hematology for routine follow-up.  Notes hemoglobin had dropped down to 6.6 during this pregnancy requiring transfusion of 2 units of PRBCs back in April/May of this year.  She reports that her potassium is always low, but she has never been placed on any daily supplement.  Her ulcerative colitis is being managed without need of medications.  She reports having approximately 3 nonbloody, non-mucousy stools per day on average.  Denied having any nausea, vomiting, or fever recently.  Normally patient also reports that her blood pressures are low, but since being in the hospital patient blood pressures elevated up to 148/92.  Repeat labs this morning reveal potassium 2.1 despite patient being given 60 mEq of potassium chloride IV.  TRH was called to formally evaluate.  Patient denies any uterine contractions, muscle cramps, or vaginal leakage.  She has been trying to eat foods that are high in sodium at baseline   Assessment & Plan:   Active Problems:   Hypokalemia   Gestational hypertension  Severe hypokalemia Patient is status post IV p.o. repletion.  Potassium is up to 2.5.  Her magnesium was not found to be low.  Urine potassium creatinine and sodium were all within normal range and an appropriate ratios. Continues to suspect this is GI loss related as there is no evidence of an RTA, however aldosterone and renin levels are still pending. Aggressive repletion today with 40 p.o. 3 times daily 40 mill  equivalents IV Recheck at 6 PM If potassium is close to 3 should be fine with discharge with p.o. repletion which usually works better than IV.  History of ulcerative colitis Currently on no medication for this Follow-up per GI as an outpatient  Anemia Has seen hematology. Status post transfusion and IV iron this pregnancy with stable hemoglobin.  Pregnancy Care per primary team  Elevated blood pressure New onset this pregnancy, consistent with preeclampsia Care per OB team  DVT prophylaxis: None:Ambulating Code Status: Full code  Family Communication: Mother at bedside Disposition Plan: Home if we can replete her potassium and her blood pressures are stable from the OB side if okay for discharge   Procedures: None  Antimicrobials: Anti-infectives (From admission, onward)    None        Subjective: Feels well today, she denies any significant muscle contractions  Objective: Vitals:   11/06/20 0615 11/06/20 0816 11/06/20 1146 11/06/20 1200  BP: 133/80 133/89 (!) 160/106 131/88  Pulse: 77 80 83 74  Resp: '18 18 18   '$ Temp: 97.9 F (36.6 C) 98.3 F (36.8 C) 98.1 F (36.7 C)   TempSrc: Oral Oral Oral   SpO2: 100% 100% 99%   Weight:      Height:        Intake/Output Summary (Last 24 hours) at 11/06/2020 1322 Last data filed at 11/06/2020 0500 Gross per 24 hour  Intake 4512.15 ml  Output --  Net 4512.15 ml   Filed Weights   11/04/20 2003  Weight: 70.3 kg  Examination:  General exam: Appears calm and comfortable  Respiratory system: Clear to auscultation. Respiratory effort normal. Cardiovascular system: S1 & S2 heard, RRR.  Gastrointestinal system: Abdomen is gravid, soft and nontender.  Central nervous system: Alert and oriented. No focal neurological deficits. Extremities: Symmetric  Skin: No rashes Psychiatry: Judgement and insight appear normal. Mood & affect appropriate.     Data Reviewed: I have personally reviewed following labs and  imaging studies  CBC: Recent Labs  Lab 11/04/20 1410 11/05/20 0813  WBC 5.6 5.4  NEUTROABS 3.6 3.2  HGB 12.7 11.9*  HCT 36.8 34.6*  MCV 85.4 86.9  PLT 235 [id number]   Basic Metabolic Panel: Recent Labs  Lab 11/04/20 1410 11/05/20 0514 11/05/20 0813 11/05/20 1346 11/06/20 0518  NA 142 138  --   --  141  K 2.4* 2.1*  --  2.4* 2.5*  CL 107 106  --   --  111  CO2 24 25  --   --  22  GLUCOSE 126* 81  --   --  69*  BUN <4* <5*  --   --  <5*  CREATININE 0.60 0.45  --   --  0.32*  CALCIUM 9.1 8.4*  --   --  8.4*  MG  --   --  1.9  --   --    GFR: Estimated Creatinine Clearance: 95.6 mL/min (A) (by C-G formula based on SCr of 0.32 mg/dL (L)). Liver Function Tests: Recent Labs  Lab 11/04/20 1410 11/05/20 0514 11/06/20 0518  AST 35 36 35  ALT '24 26 26  '$ ALKPHOS 117 90 80  BILITOT 0.6 1.0 0.8  PROT 6.8 5.7* 5.4*  ALBUMIN 2.8* 2.4* 2.2*   Anemia Panel: Recent Labs    11/04/20 1410 11/04/20 1411  VITAMINB12 139*  --   FERRITIN  --  29  TIBC  --  409  IRON  --  124   Sepsis Labs:   Recent Results (from the past 240 hour(s))  SARS CORONAVIRUS 2 (TAT 6-24 HRS) Nasopharyngeal Nasopharyngeal Swab     Status: None   Collection Time: 11/05/20  2:41 PM   Specimen: Nasopharyngeal Swab  Result Value Ref Range Status   SARS Coronavirus 2 NEGATIVE NEGATIVE Final    Comment: (NOTE) SARS-CoV-2 target nucleic acids are NOT DETECTED.  The SARS-CoV-2 RNA is generally detectable in upper and lower respiratory specimens during the acute phase of infection. Negative results do not preclude SARS-CoV-2 infection, do not rule out co-infections with other pathogens, and should not be used as the sole basis for treatment or other patient management decisions. Negative results must be combined with clinical observations, patient history, and epidemiological information. The expected result is Negative.  Fact Sheet for Patients: SugarRoll.be  Fact Sheet  for Healthcare Providers: https://www.woods-mathews.com/  This test is not yet approved or cleared by the [place] FDA and  has been authorized for detection and/or diagnosis of SARS-CoV-2 by FDA under an Emergency Use Authorization (EUA). This EUA will remain  in effect (meaning this test can be used) for the duration of the COVID-19 declaration under Se ction 564(b)(1) of the Act, 21 U.S.C. section 360bbb-3(b)(1), unless the authorization is terminated or revoked sooner.  Performed at [facility], [place] [address] [place], [place] [address]       Radiology Studies: [place] MFM FETAL BPP WO NON STRESS  Result Date: 11/05/2020 ----------------------------------------------------------------------  OBSTETRICS REPORT                       (  Signed Final 11/05/2020 04:18 pm) ---------------------------------------------------------------------- Patient Info  ID #:       [id number]                          D.O.B.:  1986/01/06 (35 yrs)  Name:       KIYLAH JAYME                       Visit Date: 11/05/2020 07:46 am              [place] ---------------------------------------------------------------------- Performed By  Attending:        Johnell Comings MD         Ref. Address:     [facility]                                                             Gynecology                                                             [address]                                                             [place]                                                             [place], [place]  Performed By:     Germain Osgood            [facility]  [place]  Referred By:      Donnel Saxon                    CNM  ---------------------------------------------------------------------- Orders  #  Description                           Code        Ordered By  1  [place] MFM OB LIMITED                     76815.01    JADE MONTANA  2  [place] MFM FETAL BPP WO NON               76819.01    JADE MONTANA     STRESS ----------------------------------------------------------------------  #  Order #                     Accession #                Episode #  1  [id number]                   [id number]  2  [id number]                   [id number]                 [id number] ---------------------------------------------------------------------- Indications  Maternal care for known or suspected poor      O36.5930  fetal growth, third trimester, not applicable or  unspecified IUGR  Advanced maternal age primigravida 50+,        O15.513  third trimester  Poor obstetrical history (History Severe       O09.299  Anemia)(Ulcerative Colitis)(Thrombocytosis)  [redacted] weeks gestation of pregnancy                Z3A.36 ---------------------------------------------------------------------- Fetal Evaluation  Num Of Fetuses:         1  Fetal Heart Rate(bpm):  123  Cardiac Activity:       Observed  Presentation:           Cephalic  Placenta:               Posterior Fundal  P. Cord Insertion:      Visualized, central  Amniotic Fluid  AFI FV:      Within normal limits  AFI Sum(cm)     %Tile       Largest Pocket(cm)  11.8            35          4.5  RUQ(cm)       RLQ(cm)       LUQ(cm)        LLQ(cm)  2.4           3.8           4.5            1.1 ---------------------------------------------------------------------- Biophysical Evaluation  Amniotic F.V:   Pocket => 2 cm             F. Tone:  Observed  F. Movement:    Observed                   Score:          8/8  F. Breathing:   Observed ---------------------------------------------------------------------- Biometry  LV:        2.9  mm  ---------------------------------------------------------------------- OB History  Gravidity:    1         Term:   0        Prem:   0        SAB:   0  TOP:          0       Ectopic:  0        Living: 0 ---------------------------------------------------------------------- Gestational Age  LMP:           40w 1d        Date:  01/29/20                 EDD:   11/04/20  Best:          Harolyn Rutherford 0d     Det. By:  Previous Ultrasound      EDD:   12/03/20 ---------------------------------------------------------------------- Anatomy  Ventricles:            Appears normal         Kidneys:                Appear normal  Diaphragm:             Appears normal         Bladder:                Appears normal  Stomach:               Appears normal, left                         sided ---------------------------------------------------------------------- Cervix Uterus Adnexa  Cervix  Not visualized (advanced GA >24wks) ---------------------------------------------------------------------- Comments  This patient was seen for a biophysical profile due to IUGR.  She is hospitalized due to hypokalemia and preeclampsia.  A biophysical profile performed today was 8 out of 8.  There was normal amniotic fluid noted on today's ultrasound  exam with a total AFI of 11.8 cm. ----------------------------------------------------------------------                   Johnell Comings, MD Electronically Signed Final Report   11/05/2020 04:18 pm ----------------------------------------------------------------------  [place] MFM OB LIMITED  Result Date: 11/05/2020 ----------------------------------------------------------------------  OBSTETRICS REPORT                       (Signed Final 11/05/2020 04:18 pm) ---------------------------------------------------------------------- Patient Info  ID #:       [id number]                          D.O.B.:  1986-02-18 (35 yrs)  Name:       Monica Green                       Visit Date: 11/05/2020 07:46 am              [place]  ---------------------------------------------------------------------- Performed By  Attending:        Johnell Comings MD         Ref. Address:     [place]                                                             Gynecology                                                             [place]                                                             [place], [place]  Performed By:     Germain Osgood            Location:         [facility]                    [place]  Referred By:      Donnel Saxon                    CNM ---------------------------------------------------------------------- Orders  #  Description                           Code        Ordered By  1  [place] MFM OB LIMITED  [id number]    JADE MONTANA  2  [place] MFM FETAL BPP WO NON               76819.01    JADE MONTANA     STRESS ----------------------------------------------------------------------  #  Order #                     Accession #                Episode #  1  [id number]                   [id number]  2  [id number]                   [id number]                 [id number] ---------------------------------------------------------------------- Indications  Maternal care for known or suspected poor      O36.5930  fetal growth, third trimester, not applicable or  unspecified IUGR  Advanced maternal age primigravida 56+,        O3.513  third trimester  Poor obstetrical history (History Severe       O09.299  Anemia)(Ulcerative Colitis)(Thrombocytosis)  [redacted] weeks gestation of pregnancy                Z3A.36 ---------------------------------------------------------------------- Fetal  Evaluation  Num Of Fetuses:         1  Fetal Heart Rate(bpm):  123  Cardiac Activity:       Observed  Presentation:           Cephalic  Placenta:               Posterior Fundal  P. Cord Insertion:      Visualized, central  Amniotic Fluid  AFI FV:      Within normal limits  AFI Sum(cm)     %Tile       Largest Pocket(cm)  11.8            35          4.5  RUQ(cm)       RLQ(cm)       LUQ(cm)        LLQ(cm)  2.4           3.8           4.5            1.1 ---------------------------------------------------------------------- Biophysical Evaluation  Amniotic F.V:   Pocket => 2 cm             F. Tone:        Observed  F. Movement:    Observed                   Score:          8/8  F. Breathing:   Observed ---------------------------------------------------------------------- Biometry  LV:        2.9  mm ---------------------------------------------------------------------- OB History  Gravidity:    1         Term:   0        Prem:   0        SAB:   0  TOP:          0       Ectopic:  0        Living: 0 ----------------------------------------------------------------------  Gestational Age  LMP:           75w 1d        Date:  01/29/20                 EDD:   11/04/20  Best:          Harolyn Rutherford 0d     Det. By:  Previous Ultrasound      EDD:   12/03/20 ---------------------------------------------------------------------- Anatomy  Ventricles:            Appears normal         Kidneys:                Appear normal  Diaphragm:             Appears normal         Bladder:                Appears normal  Stomach:               Appears normal, left                         sided ---------------------------------------------------------------------- Cervix Uterus Adnexa  Cervix  Not visualized (advanced GA >24wks) ---------------------------------------------------------------------- Comments  This patient was seen for a biophysical profile due to IUGR.  She is hospitalized due to hypokalemia and preeclampsia.  A biophysical profile performed  today was 8 out of 8.  There was normal amniotic fluid noted on today's ultrasound  exam with a total AFI of 11.8 cm. ----------------------------------------------------------------------                   Johnell Comings, MD Electronically Signed Final Report   11/05/2020 04:18 pm ----------------------------------------------------------------------    Scheduled Meds:  betamethasone acetate-betamethasone sodium phosphate  12 mg Intramuscular Q24 Hr x 2   cholecalciferol  4,000 Units Oral Daily   docusate sodium  100 mg Oral Daily   potassium chloride  40 mEq Oral TID   prenatal multivitamin  1 tablet Oral Q1200   vitamin B-12  50 mcg Oral Daily   Continuous Infusions:  lactated ringers with kcl     lactated ringers 75 mL/hr at 11/06/20 1035   potassium chloride 10 mEq (11/06/20 1240)     LOS: 2 days    Donnamae Jude, MD 11/06/2020 1:22 PM [phone] [facility] If 7PM-7AM, please contact night-coverage 11/06/2020, 1:22 PM

## 2020-11-06 NOTE — Progress Notes (Addendum)
Date and time results received: 11/06/20 08:20am   Test: Potassium Critical Value: 2.5  Name of Provider Notified: Beatrix Fetters, CNM @ 8:25  Orders Received? Continue to monitor, CNM to consult with [facility]. New orders received for K repletion.

## 2020-11-06 NOTE — Plan of Care (Signed)
  Problem: Education: Goal: Knowledge of General Education information will improve Description: Including pain rating scale, medication(s)/side effects and non-pharmacologic comfort measures Outcome: Completed/Met   Problem: Health Behavior/Discharge Planning: Goal: Ability to manage health-related needs will improve Outcome: Completed/Met   Problem: Clinical Measurements: Goal: Ability to maintain clinical measurements within normal limits will improve Outcome: Completed/Met Goal: Will remain free from infection 11/06/2020 2056 by Melvyn Neth, RN Outcome: Adequate for Discharge 11/06/2020 2054 by Melvyn Neth, RN Outcome: Adequate for Discharge Note: K+ levels increased to 3.3 with IV and oral K+ supplements Goal: Diagnostic test results will improve 11/06/2020 2056 by Melvyn Neth, RN Outcome: Completed/Met 11/06/2020 2054 by Melvyn Neth, RN Outcome: Adequate for Discharge Note: K+ levels improved with interventions Goal: Respiratory complications will improve Outcome: Completed/Met Goal: Cardiovascular complication will be avoided Outcome: Completed/Met   Problem: Activity: Goal: Risk for activity intolerance will decrease Outcome: Completed/Met   Problem: Nutrition: Goal: Adequate nutrition will be maintained Outcome: Completed/Met   Problem: Coping: Goal: Level of anxiety will decrease Outcome: Completed/Met   Problem: Elimination: Goal: Will not experience complications related to bowel motility Outcome: Completed/Met Goal: Will not experience complications related to urinary retention Outcome: Completed/Met   Problem: Pain Managment: Goal: General experience of comfort will improve Outcome: Completed/Met   Problem: Safety: Goal: Ability to remain free from injury will improve Outcome: Completed/Met   Problem: Skin Integrity: Goal: Risk for impaired skin integrity will decrease Outcome: Completed/Met   Problem: Education: Goal: Knowledge of  disease or condition will improve Outcome: Completed/Met Goal: Knowledge of the prescribed therapeutic regimen will improve Outcome: Completed/Met Goal: Individualized Educational Video(s) Outcome: Completed/Met   Problem: Clinical Measurements: Goal: Complications related to the disease process, condition or treatment will be avoided or minimized Outcome: Completed/Met

## 2020-11-07 LAB — BASIC METABOLIC PANEL
Anion gap: 7 (ref 5–15)
BUN: <5 mg/dL — ABNORMAL LOW (ref 6–20)
CO2: 22 mmol/L (ref 22–32)
Calcium: 8.9 mg/dL (ref 8.9–10.3)
Chloride: 107 mmol/L (ref 98–111)
Creatinine, Ser: 0.43 mg/dL — ABNORMAL LOW (ref 0.44–1.00)
GFR, Estimated: >60 mL/min (ref >60)
Glucose, Bld: 75 mg/dL (ref 70–99)
Potassium: 3.3 mmol/L — ABNORMAL LOW (ref 3.5–5.1)
Sodium: 136 mmol/L (ref 135–145)

## 2020-11-09 ENCOUNTER — Other Ambulatory Visit: Payer: Self-pay | Admitting: Obstetrics and Gynecology

## 2020-11-09 ENCOUNTER — Encounter (HOSPITAL_COMMUNITY): Payer: Self-pay | Admitting: *Deleted

## 2020-11-09 ENCOUNTER — Telehealth (HOSPITAL_COMMUNITY): Payer: Self-pay | Admitting: *Deleted

## 2020-11-09 NOTE — Telephone Encounter (Signed)
Preadmission screen  

## 2020-11-10 LAB — ALDOSTERONE + RENIN ACTIVITY W/ RATIO
ALDO / PRA Ratio: <2 (ref 0.0–30.0)
Aldosterone: <1 ng/dL (ref 0.0–30.0)
PRA LC/MS/MS: 0.51 ng/mL/hr (ref 0.167–5.380)

## 2020-11-11 ENCOUNTER — Other Ambulatory Visit: Payer: Self-pay

## 2020-11-11 ENCOUNTER — Ambulatory Visit: Payer: Medicaid Other | Attending: Maternal & Fetal Medicine

## 2020-11-11 ENCOUNTER — Encounter: Payer: Self-pay | Admitting: *Deleted

## 2020-11-11 ENCOUNTER — Ambulatory Visit: Payer: Medicaid Other | Admitting: *Deleted

## 2020-11-11 VITALS — BP 140/95 | HR 95

## 2020-11-11 DIAGNOSIS — O365930 Maternal care for other known or suspected poor fetal growth, third trimester, not applicable or unspecified: Secondary | ICD-10-CM

## 2020-11-11 NOTE — Progress Notes (Signed)
Dr. Annamaria Boots aware of elevated BP's

## 2020-11-12 ENCOUNTER — Inpatient Hospital Stay (HOSPITAL_COMMUNITY): Payer: Medicaid Other

## 2020-11-12 ENCOUNTER — Telehealth (HOSPITAL_COMMUNITY): Payer: Self-pay | Admitting: *Deleted

## 2020-11-12 ENCOUNTER — Encounter (HOSPITAL_COMMUNITY): Payer: Self-pay

## 2020-11-12 NOTE — Telephone Encounter (Signed)
Preadmission screen  

## 2020-11-12 NOTE — Progress Notes (Signed)
Pt was called by L&D Charge RN to come in for induction. Per pt, she was not prepared to come today and was informed by Dr. Annamaria Boots after her Korea on 8/12, that she would have a repeat growth Korea next week and induction is recommended at 38 weeks. CNM called pt and advised that induction is recommended today for increased BP values and hx of IUGR. Pt continues to decline to report to hospital for induction. Discussed case w/ Dr. Mancel Bale. MD recommends induction for the above reasons and to speak w/ Dr. Annamaria Boots RE: rationale for recommending induction at 38 wks. Spoke w/ Dr. Annamaria Boots. MD states that he was not aware of her scheduled induction date and supports the recommendation for induction today at 37 wks based on her increasing BP (143/11, 139/90, 140/95 in MFM 11/11/20). Protein creatinine ratio 0.41 on 11/05/20. Returned call to pt, discussed recommendation supported by Dr. Annamaria Boots and Dr. Mancel Bale, and gave the option of arriving at midnight. Pt states that she told Dr. Annamaria Boots of her induction date and "he said I was deliver at 38 wks" and that she will adhere to Dr. Fritz Pickerel recommendation made on 11/11/20. Pt is not willing to be induced today. Dr. Mancel Bale made aware. Pt has an [place] sched on 8/17 w/ MFM. IOL sched w/ L&D for 8/19. Called pt to confirm induction date of 8/19, no answer, left VM  Burman Foster, MSN, CNM 11/12/2020 6:22 PM

## 2020-11-17 ENCOUNTER — Encounter: Payer: Self-pay | Admitting: *Deleted

## 2020-11-17 ENCOUNTER — Ambulatory Visit: Payer: Medicaid Other | Admitting: *Deleted

## 2020-11-17 ENCOUNTER — Other Ambulatory Visit: Payer: Self-pay | Admitting: Maternal & Fetal Medicine

## 2020-11-17 ENCOUNTER — Other Ambulatory Visit: Payer: Self-pay

## 2020-11-17 ENCOUNTER — Ambulatory Visit: Payer: Medicaid Other | Attending: Maternal & Fetal Medicine

## 2020-11-17 VITALS — BP 138/96 | HR 79

## 2020-11-17 VITALS — BP 134/81

## 2020-11-17 DIAGNOSIS — O365930 Maternal care for other known or suspected poor fetal growth, third trimester, not applicable or unspecified: Secondary | ICD-10-CM | POA: Diagnosis not present

## 2020-11-17 DIAGNOSIS — O288 Other abnormal findings on antenatal screening of mother: Secondary | ICD-10-CM

## 2020-11-17 DIAGNOSIS — O09293 Supervision of pregnancy with other poor reproductive or obstetric history, third trimester: Secondary | ICD-10-CM | POA: Diagnosis not present

## 2020-11-17 DIAGNOSIS — O09513 Supervision of elderly primigravida, third trimester: Secondary | ICD-10-CM | POA: Diagnosis not present

## 2020-11-17 DIAGNOSIS — Z3A37 37 weeks gestation of pregnancy: Secondary | ICD-10-CM | POA: Diagnosis not present

## 2020-11-17 DIAGNOSIS — O133 Gestational [pregnancy-induced] hypertension without significant proteinuria, third trimester: Secondary | ICD-10-CM | POA: Insufficient documentation

## 2020-11-17 NOTE — Procedures (Signed)
Monica Green 1985/05/31 [redacted]w[redacted]d Fetus A Non-Stress Test Interpretation for 11/17/20  Indication: IUGR  Fetal Heart Rate A Mode: External Baseline Rate (A): 145 bpm Variability: Moderate Accelerations: 15 x 15 Decelerations: None Multiple birth?: No  Uterine Activity Mode: Palpation, Toco Contraction Frequency (min): none Resting Tone Palpated: Relaxed  Interpretation (Fetal Testing) Nonstress Test Interpretation: Reactive Overall Impression: Reassuring for gestational age Comments: Dr. FAnnamaria Bootsreviewed tracing

## 2020-11-19 ENCOUNTER — Inpatient Hospital Stay (HOSPITAL_COMMUNITY)
Admission: AD | Admit: 2020-11-19 | Payer: Medicaid Other | Source: Home / Self Care | Admitting: Obstetrics and Gynecology

## 2020-11-19 ENCOUNTER — Telehealth: Payer: Self-pay

## 2020-11-19 ENCOUNTER — Encounter: Payer: Self-pay | Admitting: *Deleted

## 2020-11-19 ENCOUNTER — Inpatient Hospital Stay (HOSPITAL_COMMUNITY): Payer: Medicaid Other

## 2020-11-19 ENCOUNTER — Ambulatory Visit: Payer: Medicaid Other | Admitting: *Deleted

## 2020-11-19 ENCOUNTER — Ambulatory Visit (HOSPITAL_COMMUNITY): Payer: Medicaid Other | Attending: Obstetrics and Gynecology | Admitting: *Deleted

## 2020-11-19 ENCOUNTER — Other Ambulatory Visit: Payer: Self-pay

## 2020-11-19 VITALS — BP 133/99 | HR 70

## 2020-11-19 DIAGNOSIS — O365931 Maternal care for other known or suspected poor fetal growth, third trimester, fetus 1: Secondary | ICD-10-CM | POA: Diagnosis present

## 2020-11-19 DIAGNOSIS — Z3A38 38 weeks gestation of pregnancy: Secondary | ICD-10-CM | POA: Diagnosis not present

## 2020-11-19 DIAGNOSIS — O133 Gestational [pregnancy-induced] hypertension without significant proteinuria, third trimester: Secondary | ICD-10-CM

## 2020-11-19 NOTE — Procedures (Signed)
MILIAH WEILER Jun 23, 1985 [redacted]w[redacted]d Fetus A Non-Stress Test Interpretation for 11/19/20  Indication: IUGR  Fetal Heart Rate A Mode: External Baseline Rate (A): 140 bpm Variability: Moderate Accelerations: 15 x 15 Decelerations: None Multiple birth?: No  Uterine Activity Mode: Palpation, Toco Contraction Frequency (min): Occas UI Contraction Quality: Mild Resting Tone Palpated: Relaxed Resting Time: Adequate  Interpretation (Fetal Testing) Nonstress Test Interpretation: Reactive Comments: Dr. BGertie Exonreviewed tracing.

## 2020-11-19 NOTE — Telephone Encounter (Signed)
Contacted pt per Dr Grier Mitts directive to let pt know: No indication for additional IV iron at this time.  -Would recommend to continue iron polysaccharide 150 mg p.o. twice daily ongoing.  -B12 2000 mcg sublingual daily to address B12 deficiency.  Pt acknowledged and verbalized understanding.

## 2020-11-20 ENCOUNTER — Encounter (HOSPITAL_COMMUNITY): Payer: Self-pay | Admitting: Obstetrics & Gynecology

## 2020-11-20 ENCOUNTER — Inpatient Hospital Stay (HOSPITAL_COMMUNITY)
Admission: AD | Admit: 2020-11-20 | Discharge: 2020-11-20 | Disposition: A | Discharge status: Home / Self Care | Payer: Medicaid Other | Attending: Obstetrics & Gynecology | Admitting: Obstetrics & Gynecology

## 2020-11-20 ENCOUNTER — Other Ambulatory Visit: Payer: Self-pay

## 2020-11-20 DIAGNOSIS — Z3689 Encounter for other specified antenatal screening: Secondary | ICD-10-CM

## 2020-11-20 DIAGNOSIS — O09513 Supervision of elderly primigravida, third trimester: Secondary | ICD-10-CM | POA: Insufficient documentation

## 2020-11-20 DIAGNOSIS — O133 Gestational [pregnancy-induced] hypertension without significant proteinuria, third trimester: Secondary | ICD-10-CM

## 2020-11-20 DIAGNOSIS — Z79899 Other long term (current) drug therapy: Secondary | ICD-10-CM | POA: Diagnosis not present

## 2020-11-20 DIAGNOSIS — Z3A38 38 weeks gestation of pregnancy: Secondary | ICD-10-CM | POA: Insufficient documentation

## 2020-11-20 DIAGNOSIS — O365930 Maternal care for other known or suspected poor fetal growth, third trimester, not applicable or unspecified: Secondary | ICD-10-CM | POA: Diagnosis not present

## 2020-11-20 DIAGNOSIS — O1403 Mild to moderate pre-eclampsia, third trimester: Secondary | ICD-10-CM | POA: Insufficient documentation

## 2020-11-20 NOTE — MAU Provider Note (Signed)
History     CSN: [id number]  Arrival date and time: 11/20/20 1141   Event Date/Time   First Provider Initiated Contact with Patient 11/20/20 1209      Chief Complaint  Patient presents with   Nonstress test   HPI Monica Green is a 35 y.o. G1P0 at 86w1dwho presents for an NST. She is scheduled for IOL on 8/21. Her pregnancy has been complicated by preeclampsia without severe features, low AFI at 5cm, IUGR at 2.3%. She was counseled for IOL at 37 weeks due to preeclampsia and refused. She desires to wait until tomorrow when her support person can be with her.   OB History     Gravida  1   Para      Term      Preterm      AB      Living         SAB      IAB      Ectopic      Multiple      Live Births              Past Medical History:  Diagnosis Date   Anemia    Ulcerative colitis ([place]     Past Surgical History:  Procedure Laterality Date   FLEXIBLE SIGMOIDOSCOPY N/A 03/23/2015   Procedure: FLEXIBLE SIGMOIDOSCOPY;  Surgeon: JLaurence Spates MD;  [facility];  Service: Endoscopy;  Laterality: N/A;    Family History  Problem Relation Age of Onset   Colon cancer Father     Social History   Tobacco Use   Smoking status: Never   Smokeless tobacco: Never  Vaping Use   Vaping Use: Never used  Substance Use Topics   Alcohol use: Not Currently    Comment: Social   Drug use: No    Allergies: No Known Allergies  Medications Prior to Admission  Medication Sig Dispense Refill Last Dose   potassium chloride SA (KLOR-CON) 20 MEQ tablet Take 2 tablets (40 mEq total) by mouth 3 (three) times daily. 90 tablet 1    Prenatal Vit-Fe Fumarate-FA (PRENATAL MULTIVITAMIN) TABS tablet Take 1 tablet by mouth daily at 12 noon.       Review of Systems  Constitutional: Negative.  Negative for fatigue and fever.  HENT: Negative.    Respiratory: Negative.  Negative for shortness of breath.   Cardiovascular: Negative.  Negative for chest  pain.  Gastrointestinal: Negative.  Negative for abdominal pain, constipation, diarrhea, nausea and vomiting.  Genitourinary: Negative.  Negative for dysuria.  Neurological: Negative.  Negative for dizziness and headaches.  Physical Exam   Blood pressure 140/87, pulse 81, temperature 97.9 F (36.6 C), temperature source Oral, resp. rate 16, SpO2 100 %.  Physical Exam Vitals and nursing note reviewed.  Constitutional:      General: She is not in acute distress.    Appearance: She is well-developed.  HENT:     Head: Normocephalic.  Eyes:     Pupils: Pupils are equal, round, and reactive to light.  Cardiovascular:     Rate and Rhythm: Normal rate and regular rhythm.     Heart sounds: Normal heart sounds.  Pulmonary:     Effort: Pulmonary effort is normal. No respiratory distress.     Breath sounds: Normal breath sounds.  Abdominal:     General: Bowel sounds are normal. There is no distension.     Palpations: Abdomen is soft.     Tenderness: There is no  abdominal tenderness.  Skin:    General: Skin is warm and dry.  Neurological:     Mental Status: She is alert and oriented to person, place, and time.  Psychiatric:        Mood and Affect: Mood normal.        Behavior: Behavior normal.        Thought Content: Thought content normal.        Judgment: Judgment normal.   Fetal Tracing:  Baseline: 130 Variability: moderate Accels: 15x15 Decels: none  Toco: none  MAU Course  Procedures  MDM NST reactive Discussed with patient recommendation for IOL due to preeclampsia. Risks to mother and infant reviewed at length. Patient states "that is not going to happen today but I will come back for my scheduled time."  J. [place] notified of patient arrival, reason for visit and recommendations for IOL and patient refusal. Discussed options to have patient sign out AMA or community CNM to discharge patient per plan of care. Community CNM will discharge patient as planned.    Care turned over to CNM  Assessment and Plan  -Preeclampsia with severe features -IUGR  Care turned over to [place] 11/20/2020, 12:13 PM

## 2020-11-20 NOTE — MAU Note (Signed)
Monica Green is a 35 y.o. at 23w1dhere in MAU reporting: here for a NST, denies pain, bleeding, or LOF. +FM  Onset of complaint: ongoing  Pain score: 0/10  Vitals:   11/20/20 1202  BP: 140/87  Pulse: 81  Resp: 16  Temp: 97.9 F (36.6 C)  SpO2: 100%     FHT: EFM applied in room  Lab orders placed from triage: none

## 2020-11-21 ENCOUNTER — Inpatient Hospital Stay (HOSPITAL_COMMUNITY): Payer: Medicaid Other

## 2020-11-21 NOTE — Progress Notes (Signed)
Pt called to come in for scheduled IOL-no answer-message left for patient to call L&D so she could be admitted

## 2020-11-21 NOTE — Progress Notes (Signed)
Pt was called in for IOL by L&D staff and pt did not answer. Msg left by L&D staff and this CNM and no response from pt.

## 2020-11-22 NOTE — Progress Notes (Signed)
Pt had been contacted multiple times in regards to scheduled inductions-pt did not answer-multiple messages were left--1530 pt called unit and wanted to reschedule-pt stated she was in [place] and would call when she was 1 hr away--provider made aware

## 2020-11-23 ENCOUNTER — Inpatient Hospital Stay (HOSPITAL_COMMUNITY)
Admission: AD | Admit: 2020-11-23 | Discharge: 2020-11-26 | DRG: 807 | Disposition: A | Discharge status: Home / Self Care | Payer: Medicaid Other | Attending: Obstetrics & Gynecology | Admitting: Obstetrics & Gynecology

## 2020-11-23 ENCOUNTER — Encounter (HOSPITAL_COMMUNITY): Payer: Self-pay | Admitting: Obstetrics and Gynecology

## 2020-11-23 ENCOUNTER — Ambulatory Visit: Payer: Medicaid Other | Attending: Maternal & Fetal Medicine

## 2020-11-23 ENCOUNTER — Ambulatory Visit: Payer: Medicaid Other

## 2020-11-23 ENCOUNTER — Other Ambulatory Visit: Payer: Self-pay

## 2020-11-23 DIAGNOSIS — O9902 Anemia complicating childbirth: Secondary | ICD-10-CM | POA: Diagnosis present

## 2020-11-23 DIAGNOSIS — Z3A38 38 weeks gestation of pregnancy: Secondary | ICD-10-CM

## 2020-11-23 DIAGNOSIS — Z20822 Contact with and (suspected) exposure to covid-19: Secondary | ICD-10-CM | POA: Diagnosis present

## 2020-11-23 DIAGNOSIS — O1493 Unspecified pre-eclampsia, third trimester: Secondary | ICD-10-CM

## 2020-11-23 DIAGNOSIS — O365930 Maternal care for other known or suspected poor fetal growth, third trimester, not applicable or unspecified: Secondary | ICD-10-CM | POA: Diagnosis present

## 2020-11-23 DIAGNOSIS — D509 Iron deficiency anemia, unspecified: Secondary | ICD-10-CM | POA: Diagnosis present

## 2020-11-23 DIAGNOSIS — O09513 Supervision of elderly primigravida, third trimester: Secondary | ICD-10-CM | POA: Diagnosis present

## 2020-11-23 DIAGNOSIS — O99824 Streptococcus B carrier state complicating childbirth: Secondary | ICD-10-CM | POA: Diagnosis present

## 2020-11-23 DIAGNOSIS — O365990 Maternal care for other known or suspected poor fetal growth, unspecified trimester, not applicable or unspecified: Secondary | ICD-10-CM | POA: Diagnosis present

## 2020-11-23 DIAGNOSIS — O365932 Maternal care for other known or suspected poor fetal growth, third trimester, fetus 2: Secondary | ICD-10-CM | POA: Diagnosis present

## 2020-11-23 DIAGNOSIS — O1404 Mild to moderate pre-eclampsia, complicating childbirth: Principal | ICD-10-CM | POA: Diagnosis present

## 2020-11-23 DIAGNOSIS — O133 Gestational [pregnancy-induced] hypertension without significant proteinuria, third trimester: Secondary | ICD-10-CM

## 2020-11-23 HISTORY — DX: Unspecified pre-eclampsia, third trimester: O14.93

## 2020-11-23 LAB — CBC
HCT: 41.2 % (ref 36.0–46.0)
Hemoglobin: 13.5 g/dL (ref 12.0–15.0)
MCH: 29.8 pg (ref 26.0–34.0)
MCHC: 32.8 g/dL (ref 30.0–36.0)
MCV: 90.9 fL (ref 80.0–100.0)
Platelets: 263 10*3/uL (ref 150–400)
RBC: 4.53 MIL/uL (ref 3.87–5.11)
RDW: 15.7 % — ABNORMAL HIGH (ref 11.5–15.5)
WBC: 6.4 10*3/uL (ref 4.0–10.5)
nRBC: 0 % (ref 0.0–0.2)

## 2020-11-23 LAB — TYPE AND SCREEN
ABO/RH(D): B POS
Antibody Screen: NEGATIVE

## 2020-11-23 LAB — COMPREHENSIVE METABOLIC PANEL
ALT: 23 U/L (ref 0–44)
AST: 35 U/L (ref 15–41)
Albumin: 2.9 g/dL — ABNORMAL LOW (ref 3.5–5.0)
Alkaline Phosphatase: 117 U/L (ref 38–126)
Anion gap: 12 (ref 5–15)
BUN: <5 mg/dL — ABNORMAL LOW (ref 6–20)
CO2: 21 mmol/L — ABNORMAL LOW (ref 22–32)
Calcium: 9.4 mg/dL (ref 8.9–10.3)
Chloride: 104 mmol/L (ref 98–111)
Creatinine, Ser: 0.54 mg/dL (ref 0.44–1.00)
GFR, Estimated: >60 mL/min (ref >60)
Glucose, Bld: 63 mg/dL — ABNORMAL LOW (ref 70–99)
Potassium: 3.6 mmol/L (ref 3.5–5.1)
Sodium: 137 mmol/L (ref 135–145)
Total Bilirubin: 0.9 mg/dL (ref 0.3–1.2)
Total Protein: 6.7 g/dL (ref 6.5–8.1)

## 2020-11-23 LAB — RESP PANEL BY RT-PCR (FLU A&B, COVID) ARPGX2
Influenza A by PCR: NEGATIVE
Influenza B by PCR: NEGATIVE
SARS Coronavirus 2 by RT PCR: NEGATIVE

## 2020-11-23 MED ORDER — FENTANYL CITRATE (PF) 100 MCG/2ML IJ SOLN
50.0000 ug | INTRAMUSCULAR | Status: DC | PRN
  Administered 2020-11-24 (×2): 100 ug via INTRAVENOUS
  Filled 2020-11-23 (×2): qty 2

## 2020-11-23 MED ORDER — OXYTOCIN-SODIUM CHLORIDE 30-0.9 UT/500ML-% IV SOLN: 2.5000 [IU]/h | INTRAVENOUS | Status: DC

## 2020-11-23 MED ORDER — LIDOCAINE HCL (PF) 1 % IJ SOLN: 30.0000 mL | INTRAMUSCULAR | Status: DC | PRN

## 2020-11-23 MED ORDER — TERBUTALINE SULFATE 1 MG/ML IJ SOLN: 0.2500 mg | Freq: Once | INTRAMUSCULAR | Status: DC | PRN

## 2020-11-23 MED ORDER — MISOPROSTOL 50MCG HALF TABLET
50.0000 ug | ORAL_TABLET | ORAL | Status: DC
  Administered 2020-11-23 – 2020-11-24 (×3): 50 ug via ORAL
  Filled 2020-11-23 (×3): qty 1

## 2020-11-23 MED ORDER — PENICILLIN G POT IN DEXTROSE 60000 UNIT/ML IV SOLN
3.0000 10*6.[IU] | INTRAVENOUS | Status: DC
Start: 2020-11-23 — End: 2020-11-24
  Administered 2020-11-24: 3 10*6.[IU] via INTRAVENOUS
  Filled 2020-11-23: qty 50

## 2020-11-23 MED ORDER — SOD CITRATE-CITRIC ACID 500-334 MG/5ML PO SOLN
30.0000 mL | ORAL | Status: DC | PRN
Start: 2020-11-23 — End: 2020-11-24

## 2020-11-23 MED ORDER — LACTATED RINGERS IV SOLN: INTRAVENOUS | Status: DC

## 2020-11-23 MED ORDER — SODIUM CHLORIDE 0.9 % IV SOLN
5.0000 10*6.[IU] | Freq: Once | INTRAVENOUS | Status: AC
  Administered 2020-11-24: 5 10*6.[IU] via INTRAVENOUS
  Filled 2020-11-23: qty 5

## 2020-11-23 MED ORDER — OXYTOCIN BOLUS FROM INFUSION
333.0000 mL | Freq: Once | INTRAVENOUS | Status: AC
  Administered 2020-11-24: 333 mL via INTRAVENOUS

## 2020-11-23 MED ORDER — ACETAMINOPHEN 325 MG PO TABS
650.0000 mg | ORAL_TABLET | ORAL | Status: DC | PRN
  Administered 2020-11-24: 650 mg via ORAL
  Filled 2020-11-23: qty 2

## 2020-11-23 MED ORDER — LACTATED RINGERS IV SOLN: 500.0000 mL | INTRAVENOUS | Status: DC | PRN

## 2020-11-23 MED ORDER — ONDANSETRON HCL 4 MG/2ML IJ SOLN
4.0000 mg | Freq: Four times a day (QID) | INTRAMUSCULAR | Status: DC | PRN
  Administered 2020-11-24: 4 mg via INTRAVENOUS
  Filled 2020-11-23: qty 2

## 2020-11-23 NOTE — H&P (Signed)
OB ADMISSION/ HISTORY & PHYSICAL:  Admission Date: 11/23/2020  3:59 PM  Admit Diagnosis: Pre-eclampsia without severe features  Monica Green is a 35 y.o. female G28P0 31w4dpresenting for IOL for pre-eclampsia w/o severe features and IGR. Endorses active FM, denies LOF and vaginal bleeding. Denies HA, visual changes, and RUQ pain. IOL was recommended at 37 wks for IUGR and new onset pre-eclampsia. Pt declined induction @ 37 wks and agreed IOL @ 38 wks. Pt did not keep 38 wk IOL appointment and did not respond to calls made by L&D charge RN and on-call CNM. Pt arrived today, presents as stable and asymptomatic for neuro S/S of pre-e. Will repeat CMP and urine PCR. Discussed induction methods and pain management options, questions addressed.   Treated for symptomatic anemia in 2nd trimester with 2 units PRBC and IV iron. Last hematology consult on 09/27/20, H&H 12/36, will repeat CBC today.   History of current pregnancy: G1P0   Patient entered care with [facility] at 19 wks.   EDC 12/03/20 by [place]@ [id number] to uncertain LMP.   Anatomy scan:  19+3 wks, complete w/ posterior placenta.   Antenatal testing: for AMA started at 27+3 weeks Last evaluation: 37+6  wks vtx, posterior fundal placenta, AFI 5.4      (< 3%ile), EFW 5+4 (2.3%ile), BPP/NST 10/10, UA dopplers WNL   Significant prenatal events:  Patient Active Problem List   Diagnosis Date Noted   Pre-eclampsia in third trimester 11/23/2020   AMA (advanced maternal age) primigravida 344+ third trimester 11/23/2020   IUGR (intrauterine growth restriction) affecting care of mother 11/23/2020   Hypokalemia 11/04/2020   Iron deficiency anemia 07/29/2020   Thrombocytosis 03/03/2020   Ulcerative colitis ([place] 04/08/2015    Prenatal Labs: ABO, Rh: --/--/B POS (04/27 1752) Antibody: NEG (04/27 1752) Rubella: Immune (04/11 0000)  RPR: Nonreactive (04/11 0000)  HBsAg: Negative (04/11 0000)  HIV: Non-reactive (04/11 0000)  GTT: passed 1 hr GBS:    positive GC/CHL: neg/neg Genetics: low-risk female, Horizon neg Tdap/influenza vaccines: declined both   OB History  Gravida Para Term Preterm AB Living  1            SAB IAB Ectopic Multiple Live Births               # Outcome Date GA Lbr Len/2nd Weight Sex Delivery Anes PTL Lv  1 Current             Medical / Surgical History: Past medical history:  Past Medical History:  Diagnosis Date   Anemia    [place]cerative colitis ([place]     Past surgical history:  Past Surgical History:  Procedure Laterality Date   FLEXIBLE SIGMOIDOSCOPY N/A 03/23/2015   Procedure: FLEXIBLE SIGMOIDOSCOPY;  Surgeon: JLaurence Spates MD;  [facility]  Service: Endoscopy;  Laterality: N/A;   Family History:  Family History  Problem Relation Age of Onset   Colon cancer Father     Social History:  reports that she has never smoked. She has never used smokeless tobacco. She reports that she does not currently use alcohol. She reports that she does not use drugs.  Allergies: Patient has no known allergies.   Current Medications at time of admission:  Prior to Admission medications   Medication Sig Start Date End Date Taking? Authorizing Provider  potassium chloride SA (KLOR-CON) 20 MEQ tablet Take 2 tablets (40 mEq total) by mouth 3 (three) times daily. 11/06/20  Yes [place], JMarveen Reeks CNM  Prenatal  Vit-Fe Fumarate-FA (PRENATAL MULTIVITAMIN) TABS tablet Take 1 tablet by mouth daily at 12 noon.   Yes [provider]    Review of Systems: Constitutional: Negative   HENT: Negative   Eyes: Negative   Respiratory: Negative   Cardiovascular: Negative   Gastrointestinal: Negative  Genitourinary: neg for bloody show, neg for LOF   Musculoskeletal: Negative   Skin: Negative   Neurological: Negative   Endo/Heme/Allergies: Negative   Psychiatric/Behavioral: Negative    Physical Exam: VS: Blood pressure 134/87, pulse 64, temperature 98.3 F (36.8 C), temperature source Oral,  resp. rate 18, height '5\' 5"'$  (1.651 m), weight 70.4 kg, SpO2 100 %. AAO x3, no signs of distress Cardiovascular: RRR Respiratory: Lung fields clear to ausculation GU/GI: Abdomen gravid, non-tender, non-distended, active FM, vertex Extremities: no edema, negative for pain, tenderness, and cords  Cervical exam:Dilation: 1 Effacement (%): 50 Station: -2 Exam by:: Clois Dupes CNM FHR: baseline rate 135 / variability moderate / accelerations 10x10 / absent decelerations TOCO: occasional   Prenatal Transfer Tool  Maternal Diabetes: No Genetic Screening: Normal Maternal Ultrasounds/Referrals: IUGR Fetal Ultrasounds or other Referrals:  Referred to Materal Fetal Medicine  Maternal Substance Abuse:  No Significant Maternal Medications:  None Significant Maternal Lab Results: Group B Strep positive    Assessment: 35 y.o. G1P0 29w4dIOL for pre-eclampsia w/o severe features IUGR less than 3%ile AMA  FHR category 1 GBS positive Pain management plan: epidural   Plan:  Admit to L&D Routine admission orders PCN for GBS prophylaxis Epidural PRN  Dr KAlesia Richardsnotified of admission and plan of care  VArrie EasternMSN, CNM 11/23/2020 5:54 PM

## 2020-11-23 NOTE — Progress Notes (Signed)
Contacted patient to come for induction and she stated she needs to find a ride. Patient will call me back once she can confirm transportation

## 2020-11-24 ENCOUNTER — Encounter (HOSPITAL_COMMUNITY): Payer: Self-pay | Admitting: Obstetrics & Gynecology

## 2020-11-24 ENCOUNTER — Inpatient Hospital Stay (HOSPITAL_COMMUNITY): Payer: Medicaid Other | Admitting: Anesthesiology

## 2020-11-24 LAB — PROTEIN / CREATININE RATIO, URINE
Creatinine, Urine: 14.91 mg/dL
Total Protein, Urine: <6 mg/dL

## 2020-11-24 LAB — CBC
HCT: 41.1 % (ref 36.0–46.0)
Hemoglobin: 14.2 g/dL (ref 12.0–15.0)
MCH: 30.3 pg (ref 26.0–34.0)
MCHC: 34.5 g/dL (ref 30.0–36.0)
MCV: 87.8 fL (ref 80.0–100.0)
Platelets: 241 K/uL (ref 150–400)
RBC: 4.68 MIL/uL (ref 3.87–5.11)
RDW: 15.2 % (ref 11.5–15.5)
WBC: 9.3 K/uL (ref 4.0–10.5)
nRBC: 0 % (ref 0.0–0.2)

## 2020-11-24 LAB — SYPHILIS: RPR W/REFLEX TO RPR TITER AND TREPONEMAL ANTIBODIES, TRADITIONAL SCREENING AND DIAGNOSIS ALGORITHM: RPR Ser Ql: NONREACTIVE

## 2020-11-24 MED ORDER — FENTANYL-BUPIVACAINE-NACL 0.5-0.125-0.9 MG/250ML-% EP SOLN: 12.0000 mL/h | EPIDURAL | Status: DC | PRN

## 2020-11-24 MED ORDER — TRANEXAMIC ACID-NACL 1000-0.7 MG/100ML-% IV SOLN
INTRAVENOUS | Status: AC
  Administered 2020-11-24: 1000 mg
  Filled 2020-11-24: qty 100

## 2020-11-24 MED ORDER — OXYCODONE HCL 5 MG PO TABS: 10.0000 mg | ORAL_TABLET | ORAL | Status: DC | PRN

## 2020-11-24 MED ORDER — PRENATAL MULTIVITAMIN CH
1.0000 | ORAL_TABLET | Freq: Every day | ORAL | Status: DC
  Administered 2020-11-25 – 2020-11-26 (×2): 1 via ORAL
  Filled 2020-11-24 (×2): qty 1

## 2020-11-24 MED ORDER — BENZOCAINE-MENTHOL 20-0.5 % EX AERO
1.0000 "application " | INHALATION_SPRAY | CUTANEOUS | Status: DC | PRN
  Filled 2020-11-24: qty 56

## 2020-11-24 MED ORDER — ONDANSETRON HCL 4 MG/2ML IJ SOLN: 4.0000 mg | INTRAMUSCULAR | Status: DC | PRN

## 2020-11-24 MED ORDER — FENTANYL-BUPIVACAINE-NACL 0.5-0.125-0.9 MG/250ML-% EP SOLN
12.0000 mL/h | EPIDURAL | Status: DC | PRN
  Administered 2020-11-24: 12 mL/h via EPIDURAL
  Filled 2020-11-24: qty 250

## 2020-11-24 MED ORDER — SODIUM BICARBONATE 8.4 % IV SOLN
INTRAVENOUS | Status: DC | PRN
  Administered 2020-11-24: 5 mL via EPIDURAL

## 2020-11-24 MED ORDER — EPHEDRINE 5 MG/ML INJ: 10.0000 mg | INTRAVENOUS | Status: DC | PRN

## 2020-11-24 MED ORDER — ZOLPIDEM TARTRATE 5 MG PO TABS: 5.0000 mg | ORAL_TABLET | Freq: Every evening | ORAL | Status: DC | PRN

## 2020-11-24 MED ORDER — OXYCODONE HCL 5 MG PO TABS
5.0000 mg | ORAL_TABLET | ORAL | Status: DC | PRN
  Administered 2020-11-25 – 2020-11-26 (×2): 5 mg via ORAL
  Filled 2020-11-24 (×2): qty 1

## 2020-11-24 MED ORDER — WITCH HAZEL-GLYCERIN EX PADS: 1.0000 "application " | MEDICATED_PAD | CUTANEOUS | Status: DC | PRN

## 2020-11-24 MED ORDER — ACETAMINOPHEN 325 MG PO TABS
650.0000 mg | ORAL_TABLET | ORAL | Status: DC | PRN
  Administered 2020-11-24 – 2020-11-26 (×2): 650 mg via ORAL
  Filled 2020-11-24 (×3): qty 2

## 2020-11-24 MED ORDER — TERBUTALINE SULFATE 1 MG/ML IJ SOLN: 0.2500 mg | Freq: Once | INTRAMUSCULAR | Status: DC | PRN

## 2020-11-24 MED ORDER — DIPHENHYDRAMINE HCL 25 MG PO CAPS: 25.0000 mg | ORAL_CAPSULE | Freq: Four times a day (QID) | ORAL | Status: DC | PRN

## 2020-11-24 MED ORDER — DIBUCAINE (PERIANAL) 1 % EX OINT: 1.0000 "application " | TOPICAL_OINTMENT | CUTANEOUS | Status: DC | PRN

## 2020-11-24 MED ORDER — SIMETHICONE 80 MG PO CHEW: 80.0000 mg | CHEWABLE_TABLET | ORAL | Status: DC | PRN

## 2020-11-24 MED ORDER — LACTATED RINGERS IV SOLN: 500.0000 mL | Freq: Once | INTRAVENOUS | Status: DC

## 2020-11-24 MED ORDER — PHENYLEPHRINE 40 MCG/ML (10ML) SYRINGE FOR IV PUSH (FOR BLOOD PRESSURE SUPPORT)
80.0000 ug | PREFILLED_SYRINGE | INTRAVENOUS | Status: DC | PRN
  Administered 2020-11-24: 80 ug via INTRAVENOUS

## 2020-11-24 MED ORDER — DIPHENHYDRAMINE HCL 50 MG/ML IJ SOLN
12.5000 mg | INTRAMUSCULAR | Status: DC | PRN
Start: 2020-11-24 — End: 2020-11-24

## 2020-11-24 MED ORDER — PHENYLEPHRINE 40 MCG/ML (10ML) SYRINGE FOR IV PUSH (FOR BLOOD PRESSURE SUPPORT): 80.0000 ug | PREFILLED_SYRINGE | INTRAVENOUS | Status: DC | PRN

## 2020-11-24 MED ORDER — COCONUT OIL OIL: 1.0000 "application " | TOPICAL_OIL | Status: DC | PRN

## 2020-11-24 MED ORDER — PHENYLEPHRINE 40 MCG/ML (10ML) SYRINGE FOR IV PUSH (FOR BLOOD PRESSURE SUPPORT)
80.0000 ug | PREFILLED_SYRINGE | INTRAVENOUS | Status: DC | PRN
  Filled 2020-11-24: qty 10

## 2020-11-24 MED ORDER — ONDANSETRON HCL 4 MG PO TABS: 4.0000 mg | ORAL_TABLET | ORAL | Status: DC | PRN

## 2020-11-24 MED ORDER — OXYTOCIN-SODIUM CHLORIDE 30-0.9 UT/500ML-% IV SOLN
1.0000 m[IU]/min | INTRAVENOUS | Status: DC
  Filled 2020-11-24: qty 500

## 2020-11-24 MED ORDER — TETANUS-DIPHTH-ACELL PERTUSSIS 5-2.5-18.5 LF-MCG/0.5 IM SUSY: 0.5000 mL | PREFILLED_SYRINGE | Freq: Once | INTRAMUSCULAR | Status: DC

## 2020-11-24 MED ORDER — SENNOSIDES-DOCUSATE SODIUM 8.6-50 MG PO TABS
2.0000 | ORAL_TABLET | Freq: Every day | ORAL | Status: DC
  Filled 2020-11-24: qty 2

## 2020-11-24 MED ORDER — IBUPROFEN 600 MG PO TABS
600.0000 mg | ORAL_TABLET | Freq: Four times a day (QID) | ORAL | Status: DC
  Administered 2020-11-24 – 2020-11-26 (×8): 600 mg via ORAL
  Filled 2020-11-24 (×8): qty 1

## 2020-11-24 NOTE — Progress Notes (Signed)
Subjective:    Doing well. Vaginal exam deferred as RN has just performed SVE. Pt planning to   Objective:    VS: BP (!) 147/100   Pulse 75   Temp 98.1 F (36.7 C) (Oral)   Resp 18   Ht '5\' 5"'$  (1.651 m)   Wt 70.4 kg   SpO2 100%   BMI 25.83 kg/m  FHR : baseline 135 / variability moderate / accelerations present / absent decelerations Toco: contractions every 2-3 minutes  Membranes: intact Dilation: 1 Effacement (%): 50 Station: -2 Exam by:: Earnest Conroy RN   Assessment/Plan:   35 y.o. G1P0 [redacted]w[redacted]d IOL for pre-eclampsia w/o severe features    -mild range BP    -normal CMP    -urine PCR pending IUGR less than 3%ile AMA  Labor:  Cytotec x2 Preeclampsia:  no signs or symptoms of toxicity and labs stable Fetal Wellbeing:  Category I Pain Control:   planning epidural I/D:   GBS pos Anticipated MOD:  NSVD  VArrie EasternMSN, CNM 11/24/2020 12:03 AM

## 2020-11-24 NOTE — Progress Notes (Signed)
Subjective:    Doing well. Uncomfortable with ctxs. SVE 2/70/-2. POC discussed. Fentanyl 154mg given IV. Foley balloon placed without difficulty with pt consent.   Objective:    VS: BP 129/68   Pulse 62   Temp 98.3 F (36.8 C) (Oral)   Resp 20   Ht '5\' 5"'$  (1.651 m)   Wt 70.4 kg   SpO2 100%   BMI 25.83 kg/m  FHR : baseline 135 / variability moderate / accelerations present / absent decelerations Toco: contractions every 2-4 minutes /  Membranes: Intact Dilation: 2 Effacement (%): 70 Station: -2 Presentation: Vertex Exam by:: K Janei Scheff CNM  Assessment/Plan:   35y.o. G1P0 349w5dOL for preeclampsia w/o severe features. Mild range B/Ps. Denies HA, visual disturbances or epigastric pain.  IUGR <3% AMA  Labor:  Cytotec x 3, foley balloon placed Preeclampsia:  no signs or symptoms of toxicity, intake and ouput balanced, and labs stable Fetal Wellbeing:  Category I Pain Control:  IV pain meds I/D:   GBS positive, PCN prophylaxis  Anticipated MOD:  NSVD Dr PiAlwyn Peaware of pt status and PONew RichmondSN, CNM 11/24/2020 8:25 AM

## 2020-11-24 NOTE — Progress Notes (Signed)
Subjective:    Comfortable with epidural. POC discussed with pt. AROM R/B/A reviewed. Pt request AROM. AROM complete. Clear fluid. CAT 1.  Objective:    VS: BP 139/90   Pulse 71   Temp 98.3 F (36.8 C) (Axillary)   Resp 18   Ht '5\' 5"'$  (1.651 m)   Wt 70.4 kg   SpO2 99%   BMI 25.83 kg/m  FHR : baseline 135 / variability minimal to moderate / accelerations present / absent decelerations Toco: contractions every 3-4 minutes /  Membranes: AROM @ 1155, clear fluid Dilation: 6 Effacement (%): 80 Station: 0 Presentation: Vertex Exam by:: K Holhouser CNM   Assessment/Plan:   35 y.o. G1P0 48w5dIOL for preeclampsia without severe features.  Labor: Progressing normally. Cytotec x 3, foley balloon, AROM Preeclampsia:  no signs or symptoms of toxicity, intake and ouput balanced, and labs stable Fetal Wellbeing:  Category I Pain Control:  Epidural I/D:   GBS positive. PCN prophylaxis Anticipated MOD:  NSVD Dr PAlwyn Peaaware of pt status and PArpMSN, CNM 11/24/2020 12:09 PM

## 2020-11-24 NOTE — Lactation Note (Signed)
This note was copied from a baby's chart. Lactation Consultation Note  Patient Name: Monica Green [id number] Date: 11/24/2020   Age:36 hours  LC attempted to see Mom to assist with first feeding.  RN stated that Mom desires to exclusively formula feed baby.  Broadus John 11/24/2020, 4:03 PM

## 2020-11-24 NOTE — Anesthesia Preprocedure Evaluation (Addendum)
Anesthesia Evaluation  Patient identified by MRN, date of birth, ID band Patient awake    Reviewed: Allergy & Precautions, Patient's Chart, lab work & pertinent test results  Airway Mallampati: I       Dental no notable dental hx.    Pulmonary neg pulmonary ROS,    Pulmonary exam normal        Cardiovascular hypertension, Normal cardiovascular exam     Neuro/Psych negative neurological ROS  negative psych ROS   GI/Hepatic Neg liver ROS, PUD,   Endo/Other  negative endocrine ROS  Renal/GU negative Renal ROS     Musculoskeletal negative musculoskeletal ROS (+)   Abdominal   Peds  Hematology negative hematology ROS (+)   Anesthesia Other Findings   Reproductive/Obstetrics                            Anesthesia Physical Anesthesia Plan  ASA: 2  Anesthesia Plan: Epidural   Post-op Pain Management:    Induction:   PONV Risk Score and Plan: 0  Airway Management Planned:   Additional Equipment: None  Intra-op Plan:   Post-operative Plan:   Informed Consent: I have reviewed the patients History and Physical, chart, labs and discussed the procedure including the risks, benefits and alternatives for the proposed anesthesia with the patient or authorized representative who has indicated his/her understanding and acceptance.       Plan Discussed with: CRNA  Anesthesia Plan Comments: (Lab Results      Component                Value               Date                      WBC                      9.3                 11/24/2020                HGB                      14.2                11/24/2020                HCT                      41.1                11/24/2020                MCV                      87.8                11/24/2020                PLT                      241                 11/24/2020           )       Anesthesia Quick Evaluation

## 2020-11-24 NOTE — Progress Notes (Signed)
Reviewed FHR tracing. FHR 130 with minimal variabiltiy. Occasional accelerations. Early decels noted. Dr Alwyn Pea notified of tracing. IVB infusing at present time.

## 2020-11-24 NOTE — Anesthesia Procedure Notes (Signed)
Epidural Patient location during procedure: OB Start time: 11/24/2020 11:20 AM End time: 11/24/2020 11:26 AM  Staffing Anesthesiologist: Effie Berkshire, MD Performed: anesthesiologist   Preanesthetic Checklist Completed: patient identified, IV checked, site marked, risks and benefits discussed, surgical consent, monitors and equipment checked, pre-op evaluation and timeout performed  Epidural Patient position: sitting Prep: DuraPrep Patient monitoring: heart rate, continuous pulse ox and blood pressure Approach: midline Location: L3-L4 Injection technique: LOR saline  Needle:  Needle type: Tuohy  Needle gauge: 17 G Needle length: 9 cm Catheter type: closed end flexible Catheter size: 20 Guage Test dose: negative and 1.5% lidocaine  Assessment Events: blood not aspirated, injection not painful, no injection resistance and no paresthesia  Additional Notes LOR @ 5  Patient identified. Risks/Benefits/Options discussed with patient including but not limited to bleeding, infection, nerve damage, paralysis, failed block, incomplete pain control, headache, blood pressure changes, nausea, vomiting, reactions to medications, itching and postpartum back pain. Confirmed with bedside nurse the patient's most recent platelet count. Confirmed with patient that they are not currently taking any anticoagulation, have any bleeding history or any family history of bleeding disorders. Patient expressed understanding and wished to proceed. All questions were answered. Sterile technique was used throughout the entire procedure. Please see nursing notes for vital signs. Test dose was given through epidural catheter and negative prior to continuing to dose epidural or start infusion. Warning signs of high block given to the patient including shortness of breath, tingling/numbness in hands, complete motor block, or any concerning symptoms with instructions to call for help. Patient was given instructions on  fall risk and not to get out of bed. All questions and concerns addressed with instructions to call with any issues or inadequate analgesia.    Reason for block:procedure for pain

## 2020-11-25 LAB — CBC
HCT: 34.3 % — ABNORMAL LOW (ref 36.0–46.0)
Hemoglobin: 11.3 g/dL — ABNORMAL LOW (ref 12.0–15.0)
MCH: 29.6 pg (ref 26.0–34.0)
MCHC: 32.9 g/dL (ref 30.0–36.0)
MCV: 89.8 fL (ref 80.0–100.0)
Platelets: 240 10*3/uL (ref 150–400)
RBC: 3.82 MIL/uL — ABNORMAL LOW (ref 3.87–5.11)
RDW: 15.3 % (ref 11.5–15.5)
WBC: 9.3 10*3/uL (ref 4.0–10.5)
nRBC: 0 % (ref 0.0–0.2)

## 2020-11-25 MED ORDER — NIFEDIPINE ER OSMOTIC RELEASE 30 MG PO TB24
30.0000 mg | ORAL_TABLET | Freq: Every day | ORAL | Status: DC
  Filled 2020-11-25: qty 1

## 2020-11-25 NOTE — Progress Notes (Signed)
Patient ordered Procardia-XL 30 mg daily. Patient's B/P at 0955 was 117/79. Patient declined the medication. RN encouraged the medication due to the previous B/P's being elevated.Patient still declined and requested B/P to be checked again later. Burman Foster notified.

## 2020-11-25 NOTE — Anesthesia Postprocedure Evaluation (Signed)
Anesthesia Post Note  Patient: Monica Green  Procedure(s) Performed: AN AD HOC LABOR EPIDURAL     Patient location during evaluation: Mother Baby Anesthesia Type: Epidural Level of consciousness: awake and alert and oriented Pain management: satisfactory to patient Vital Signs Assessment: post-procedure vital signs reviewed and stable Respiratory status: respiratory function stable Cardiovascular status: stable Postop Assessment: no headache, no backache, epidural receding, patient able to bend at knees, no signs of nausea or vomiting, adequate PO intake and able to ambulate Anesthetic complications: no   No notable events documented.  Last Vitals:  Vitals:   11/25/20 0240 11/25/20 0617  BP: (!) 150/94 (!) 144/94  Pulse: (!) 51 69  Resp: 18 20  Temp: 36.8 C 36.6 C  SpO2: 99% 99%    Last Pain:  Vitals:   11/25/20 0620  TempSrc:   PainSc: 0-No pain   Pain Goal:                   Breland Trouten

## 2020-11-25 NOTE — Progress Notes (Signed)
PPD# 1 SVD w/ 1st degree laceration Information for the patient's newborn:  Roquel, Carnathan [id number]  female   Baby Name [place] Circumcision N.A   S:   Reports feeling "really good". Denies HA, visual changes, and RUQ pain Tolerating PO fluid and solids No nausea or vomiting Bleeding is "like a heavy period w/o clots" Pain controlled with  PO meds Up ad lib / ambulatory / voiding w/o difficulty Feeding: Bottle    O:   VS: BP (!) 144/94 (BP Location: Right Arm)   Pulse 69   Temp 97.8 F (36.6 C) (Oral)   Resp 20   Ht '5\' 5"'$  (1.651 m)   Wt 70.4 kg   SpO2 99%   Breastfeeding Unknown   BMI 25.83 kg/m   LABS:  Recent Labs    11/24/20 0916 11/25/20 0636  WBC 9.3 9.3  HGB 14.2 11.3*  PLT 241 240   Blood type: --/--/B POS (08/23 1706) Rubella: Immune (04/11 0000)                      I&O: Intake/Output      08/24 0701 08/25 0700 08/25 0701 08/26 0700   Urine (mL/kg/hr) 1350 (0.8)    Blood 594    Total Output 1944    Net -1944         Urine Occurrence 1 x      Physical Exam: Alert and oriented X3 Lungs: Clear and unlabored Heart: regular rate and rhythm / no mumurs Abdomen: soft, non-tender, non-distended  Fundus: firm, non-tender Perineum: well-approximated, no hemorrhoids Lochia: appropriate Extremities: no edema, no calf pain or tenderness    A:  PPD # 1  Pre-eclampsia w/o severe features   -mild range BP   -start Procardia 30 mg XL today  P:  Routine post partum orders Anticipate D/C on 11/26/20  Plan reviewed w/ Dr. Effie Shy, MSN, CNM 11/25/2020, 8:05 AM

## 2020-11-25 NOTE — Progress Notes (Signed)
Rechecked B/P at 1215. It was 121/87. Patient still declining the Procardia-XL 30 mg.

## 2020-11-26 ENCOUNTER — Other Ambulatory Visit (HOSPITAL_COMMUNITY): Payer: Self-pay

## 2020-11-26 LAB — SURGICAL PATHOLOGY

## 2020-11-26 MED ORDER — IBUPROFEN 600 MG PO TABS
600.0000 mg | ORAL_TABLET | Freq: Four times a day (QID) | ORAL | 0 refills | Status: DC
Start: 2020-11-26 — End: 2021-01-14
  Filled 2020-11-26: qty 30, 8d supply, fill #0

## 2020-11-26 MED ORDER — NIFEDIPINE ER 30 MG PO TB24
30.0000 mg | ORAL_TABLET | Freq: Every day | ORAL | 0 refills | Status: DC
  Filled 2020-11-26: qty 30, 30d supply, fill #0

## 2020-11-26 NOTE — Social Work (Signed)
CSW received consult for "noncompliance of care." CSW met with MOB to offer support and complete assessment.    CSW met with MOB at bedside and introduced the CSW role. CSW congratulated both MOB and FOB. CSW observed MOB lying in bed, infant sleeping in bassinet and FOB was up in room. MOB presented pleasant, calm and welcoming of [place] visit. FOB left room to give MOB privacy to talk. CSW inquired how MOB has felt since giving birth. MOB shared she has felt good and expressed the labor and delivery went a lot better than she imagined and praised care she received. MOB disclosed she experienced some challenges with the induction process. CSW provided active listening and support. MOB shared excitement surrounding motherhood and first-time parenting. CSW observed MOB appropriately console and bottle feed the infant. CSW inquired if MOB has a history of mental health. MOB shared she developed anxiety surrounding the death father three years ago. MOB shared she has not officially been diagnosed with anxiety but has experienced panic attacks and uses deep breathing techniques to calm herself. MOB shared she most recently experienced a panic attack during Father's Day. MOB reported no history of therapy or medication. MOB shared she is open to therapy and counseling resources. CSW offered emotional support and provided education regarding the baby blues period vs. perinatal mood disorders, discussed treatment and gave resources for mental health follow up. 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 CSW recommended MOB complete self-evaluation during the postpartum time period using the New Mom Checklist from Postpartum Progress and encouraged MOB to contact a medical professional if symptoms are noted at any time. MOB shared she feels comfortable reaching out to her doctor if she has concerns. CSW inquired about MOB supports. MOB acknowledged her boyfriend, mom, siblings, and bf family as supports. CSW assessed MOB for safety. MOB  denied thoughts of harm to self and others.   CSW provided review of Sudden Infant Death Syndrome (SIDS) precautions. MOB reported she has essential items for the infant including a car seat and a basinet where the infant will safely sleep. MOB has chosen [facility] for infants follow up care. MOB reported she has transportation to appointments and plans to follow up. MOB reported no concerns with purchasing medications. MOB shared she receives [facility] and Medicaid benefits. CSW educated her on how to call and add the infant to her benefits.    CSW identifies no further need for intervention and no barriers to discharge at this time.   Kathrin Greathouse, MSW, LCSW [facility] [place] Worker  [phone] 11/26/2020  1:15 PM

## 2020-11-26 NOTE — Progress Notes (Signed)
Pt declined BP this morning. Pt declined morning dose of blood pressure medication. Pt discharged at 1 pm. Order for Q4 hr blood pressure placed after discharge at 1318 08/26.

## 2020-11-26 NOTE — Discharge Summary (Signed)
SVD OB Discharge Summary     Patient Name: Monica Green DOB: July 04, 1985 MRN: [id number]  Date of admission: 11/23/2020 Delivering MD: Holli Humbles B  Date of delivery: 11/24/2020 Type of delivery: SVD  Newborn Data: Sex: Baby female Live born female  Birth Weight: 5 lb 7.8 oz (2490 g) APGAR: 20, 9  Newborn Delivery   Birth date/time: 11/24/2020 15:15:00 Delivery type: Vaginal, Spontaneous      Feeding: breast and bottle Infant being discharge to home with mother in stable condition.   Admitting diagnosis: Pre-eclampsia in third trimester [O14.93] Intrauterine pregnancy: [redacted]w[redacted]d    Secondary diagnosis:  Active Problems:   Pre-eclampsia in third trimester   AMA (advanced maternal age) primigravida 327+ third trimester   IUGR (intrauterine growth restriction) affecting care of mother   Term newborn delivered vaginally, current hospitalization   SVD (8/24)   Normal postpartum course                                Complications: None                                                              Intrapartum Procedures: spontaneous vaginal delivery and GBS prophylaxis Postpartum Procedures: none Complications-Operative and Postpartum:  1st degree perineal laceration Augmentation: AROM, Cytotec, and IP Foley   History of Present Illness: Ms. MMIREYAH ALBERTINIis a 35y.o. female, G1P1001, who presents at 372w5deeks gestation. [facility]  Her pregnancy has been complicated by:  Patient Active Problem List   Diagnosis Date Noted   Normal postpartum course 11/26/2020   Term newborn delivered vaginally, current hospitalization 11/24/2020   SVD (8/24) 11/24/2020   Pre-eclampsia in third trimester 11/23/2020   AMA (advanced maternal age) primigravida 3540+third trimester 11/23/2020   IUGR (intrauterine growth restriction) affecting care of mother 11/23/2020   Iron deficiency anemia  07/29/2020   Thrombocytosis 03/03/2020   [place]ative [place]/08/[place]     Active Ambulatory Problems    Diagnosis Date Noted   Iron deficiency anemia 07/29/2020   [place]08[place]   Thrombocytosis 03/03/2020   Resolved Ambulatory Problems    Diagnosis Date Noted   Microcytic hypochromic anemia 01/05/2015   Symptomatic anemia 01/05/2015   Ureterolithiasis 01/06/2015   Bacterial vaginosis 01/06/2015   AP (abdominal pain) 03/21/2015   Bloody diarrhea    Generalized abdominal pain    Hypokalemia    Rectal bleed 03/21/2015   Protein-calorie malnutrition, severe 03/22/2015   Symptomatic anemia 02/17/2020   Hypokalemia 11/04/2020   Gestational hypertension 11/05/2020   Past Medical History:  Diagnosis Date   Anemia      Hospital course:  Induction of Labor With Vaginal Delivery   [age].o. yo G1P1001 at 3837w5ds admitted to the hospital 11/23/2020 for induction of labor.  Indication for induction: Preeclampsia and IUGR .  Patient had an uncomplicated labor course as follows: Membrane Rupture Time/Date: 11:58 AM ,11/24/2020   Delivery Method:Vaginal, Spontaneous  Episiotomy: None  Lacerations:  1st degree;Periurethral  Details of delivery can be found in separate delivery note.  Patient had a routine postpartum course. Patient is discharged home 11/26/20.  Newborn Data: Birth date:11/24/2020  Birth time:3:15 PM  Gender:Female  Living status:Living  Apgars:8 ,9  Weight:2490 g  Postpartum Day # 2 : S/P NSVD due to pt was admitted on 8/23 for IOL for preE w/o sf and IUGR, PCR was 0.41 3 weeks ago, other labs upon admission wnl, was placed on procardia PP for elevated BP pt declined, today pt BP has been normotensive 123/83 last night and today was 121/87, family connect called and verbalized they will visit the pt at home for 1 week BP check, denies HA, RUQ pain, or vision changes. Pt has h/o anemia with IV iron transfusion during pregnancy but in pt stable,  pt progressed to SVD on 8/24 @ 1515 over 1st degree laceration, tx x2 doses PCN for gbs+, hgb drop of 14.2-11.3, was given TXA for bleeding at delivery of 252ms. Patient up ad lib, denies syncope or dizziness. Reports consuming regular diet without issues and denies N/V. Patient reports 0 bowel movement + passing flatus.  Denies issues with urination and reports bleeding is "lighter."  Patient is breast and bottle feeding feeding and reports going well.  Desires undecided for postpartum contraception.  Pain is being appropriately managed with use of po meds.  H/O UC, no meds, stable, denies rectal or intestinal bleeding at current stay.   Physical exam  Vitals:   11/25/20 0955 11/25/20 1214 11/25/20 1319 11/25/20 2025  BP: 117/79 121/87 120/88 123/83  Pulse: (!) 58 63 69 80  Resp:   16 16  Temp:   97.6 F (36.4 C) 98 F (36.7 C)  TempSrc:   Oral Oral  SpO2:   100% 100%  Weight:      Height:       General: alert, cooperative, and no distress Lochia: appropriate Uterine Fundus: firm Perineum: approximate DVT Evaluation: No evidence of DVT seen on physical exam. Negative Homan's sign. No cords or calf tenderness. No significant calf/ankle edema.  Labs: Lab Results  Component Value Date   WBC 9.3 11/25/2020   HGB 11.3 (L) 11/25/2020   HCT 34.3 (L) 11/25/2020   MCV 89.8 11/25/2020   PLT 240 11/25/2020   CMP Latest Ref Rng & Units 11/23/2020  Glucose 70 - 99 mg/dL 63(L)  BUN 6 - 20 mg/dL <5(L)  Creatinine 0.44 - 1.00 mg/dL 0.54  Sodium 135 - 145 mmol/L 137  Potassium 3.5 - 5.1 mmol/L 3.6  Chloride 98 - 111 mmol/L 104  CO2 22 - 32 mmol/L 21(L)  Calcium 8.9 - 10.3 mg/dL 9.4  Total Protein 6.5 - 8.1 g/dL 6.7  Total Bilirubin 0.3 - 1.2 mg/dL 0.9  Alkaline Phos 38 - 126 U/L 117  AST 15 - 41 U/L 35  ALT 0 - 44 U/L 23    Date of discharge: 11/26/2020 Discharge Diagnoses: Term Pregnancy-delivered Discharge instruction: per After Visit Summary and "Baby and Me Booklet".  After  visit meds:   Activity:           unrestricted and pelvic rest Advance as tolerated. Pelvic rest for 6 weeks.  Diet:                routine Medications: PNV, Ibuprofen, and procardia '30mg'$  XL PO daily.  Postpartum contraception: Undecided Condition:  Pt discharge to home with baby in stable and condition PreE: family connect to visit, script for procardia given, pt will monitor BP and if BP >140/90s, pt verbalized she will take the medication.   Meds: Allergies as of 11/26/2020   No Known  Allergies      Medication List     STOP taking these medications    potassium chloride SA 20 MEQ tablet Commonly known as: KLOR-CON       TAKE these medications    ibuprofen 600 MG tablet Commonly known as: ADVIL Take 1 tablet (600 mg total) by mouth every 6 (six) hours.   NIFEdipine 30 MG 24 hr tablet Commonly known as: ADALAT CC Take 1 tablet (30 mg total) by mouth daily. Start taking on: November 27, 2020   prenatal multivitamin Tabs tablet Take 1 tablet by mouth daily at 12 noon.        Discharge Follow Up:   Follow-up [facility] Follow up.   Specialty: Obstetrics and Gynecology Why: 1 week BP with family connect, then 6 weeks PPV. Contact information: [address] [place] [phone] [place], NP-C, CNM 11/26/2020, 11:19 AM  Noralyn Pick, [place]

## 2020-12-04 ENCOUNTER — Other Ambulatory Visit: Payer: Self-pay

## 2020-12-04 ENCOUNTER — Inpatient Hospital Stay (HOSPITAL_COMMUNITY): Payer: Medicaid Other

## 2020-12-04 ENCOUNTER — Emergency Department (HOSPITAL_COMMUNITY): Payer: Medicaid Other

## 2020-12-04 ENCOUNTER — Encounter (HOSPITAL_COMMUNITY): Payer: Self-pay | Admitting: *Deleted

## 2020-12-04 ENCOUNTER — Inpatient Hospital Stay (HOSPITAL_COMMUNITY)
Admission: EM | Admit: 2020-12-04 | Discharge: 2020-12-06 | DRG: 776 | Disposition: A | Discharge status: Home / Self Care | Payer: Medicaid Other | Attending: Obstetrics & Gynecology | Admitting: Obstetrics & Gynecology

## 2020-12-04 DIAGNOSIS — I6783 Posterior reversible encephalopathy syndrome: Secondary | ICD-10-CM | POA: Diagnosis present

## 2020-12-04 DIAGNOSIS — O99355 Diseases of the nervous system complicating the puerperium: Secondary | ICD-10-CM | POA: Diagnosis present

## 2020-12-04 DIAGNOSIS — O152 Eclampsia in the puerperium: Principal | ICD-10-CM | POA: Diagnosis present

## 2020-12-04 DIAGNOSIS — O159 Eclampsia, unspecified as to time period: Secondary | ICD-10-CM | POA: Diagnosis not present

## 2020-12-04 DIAGNOSIS — O9943 Diseases of the circulatory system complicating the puerperium: Secondary | ICD-10-CM | POA: Diagnosis present

## 2020-12-04 DIAGNOSIS — G40909 Epilepsy, unspecified, not intractable, without status epilepticus: Secondary | ICD-10-CM | POA: Diagnosis present

## 2020-12-04 DIAGNOSIS — I10 Essential (primary) hypertension: Secondary | ICD-10-CM

## 2020-12-04 DIAGNOSIS — Z20822 Contact with and (suspected) exposure to covid-19: Secondary | ICD-10-CM | POA: Diagnosis present

## 2020-12-04 HISTORY — DX: Eclampsia, unspecified as to time period: O15.9

## 2020-12-04 HISTORY — DX: Unspecified pre-eclampsia, unspecified trimester: O14.90

## 2020-12-04 LAB — RAPID URINE DRUG SCREEN, HOSP PERFORMED
Amphetamines: NOT DETECTED
Barbiturates: NOT DETECTED
Benzodiazepines: POSITIVE — AB
Cocaine: NOT DETECTED
Opiates: NOT DETECTED
Tetrahydrocannabinol: NOT DETECTED

## 2020-12-04 LAB — URINALYSIS, ROUTINE W REFLEX MICROSCOPIC
Bilirubin Urine: NEGATIVE
Glucose, UA: NEGATIVE mg/dL
Ketones, ur: NEGATIVE mg/dL
Nitrite: NEGATIVE
Protein, ur: 30 mg/dL — AB
Specific Gravity, Urine: 1.004 — ABNORMAL LOW (ref 1.005–1.030)
pH: 7 (ref 5.0–8.0)

## 2020-12-04 LAB — CBC WITH DIFFERENTIAL/PLATELET
Abs Immature Granulocytes: 0.46 10*3/uL — ABNORMAL HIGH (ref 0.00–0.07)
Basophils Absolute: 0.1 10*3/uL (ref 0.0–0.1)
Basophils Relative: 1 %
Eosinophils Absolute: 0.4 10*3/uL (ref 0.0–0.5)
Eosinophils Relative: 3 %
HCT: 44 % (ref 36.0–46.0)
Hemoglobin: 13.8 g/dL (ref 12.0–15.0)
Immature Granulocytes: 4 %
Lymphocytes Relative: 13 %
Lymphs Abs: 1.3 10*3/uL (ref 0.7–4.0)
MCH: 29.8 pg (ref 26.0–34.0)
MCHC: 31.4 g/dL (ref 30.0–36.0)
MCV: 95 fL (ref 80.0–100.0)
Monocytes Absolute: 0.8 10*3/uL (ref 0.1–1.0)
Monocytes Relative: 8 %
Neutro Abs: 7.4 10*3/uL (ref 1.7–7.7)
Neutrophils Relative %: 71 %
Platelets: 455 10*3/uL — ABNORMAL HIGH (ref 150–400)
RBC: 4.63 MIL/uL (ref 3.87–5.11)
RDW: 15.3 % (ref 11.5–15.5)
WBC: 10.4 10*3/uL (ref 4.0–10.5)
nRBC: 0.3 % — ABNORMAL HIGH (ref 0.0–0.2)

## 2020-12-04 LAB — RESP PANEL BY RT-PCR (FLU A&B, COVID) ARPGX2
Influenza A by PCR: NEGATIVE
Influenza B by PCR: NEGATIVE
SARS Coronavirus 2 by RT PCR: NEGATIVE

## 2020-12-04 LAB — ETHANOL: Alcohol, Ethyl (B): <10 mg/dL (ref <10)

## 2020-12-04 LAB — PROTIME-INR
INR: 1.1 (ref 0.8–1.2)
Prothrombin Time: 14 seconds (ref 11.4–15.2)

## 2020-12-04 LAB — COMPREHENSIVE METABOLIC PANEL
ALT: 19 U/L (ref 0–44)
AST: 26 U/L (ref 15–41)
Albumin: 3 g/dL — ABNORMAL LOW (ref 3.5–5.0)
Alkaline Phosphatase: 83 U/L (ref 38–126)
Anion gap: 15 (ref 5–15)
BUN: 5 mg/dL — ABNORMAL LOW (ref 6–20)
CO2: 14 mmol/L — ABNORMAL LOW (ref 22–32)
Calcium: 8.7 mg/dL — ABNORMAL LOW (ref 8.9–10.3)
Chloride: 111 mmol/L (ref 98–111)
Creatinine, Ser: 0.74 mg/dL (ref 0.44–1.00)
GFR, Estimated: >60 mL/min (ref >60)
Glucose, Bld: 83 mg/dL (ref 70–99)
Potassium: 3.1 mmol/L — ABNORMAL LOW (ref 3.5–5.1)
Sodium: 140 mmol/L (ref 135–145)
Total Bilirubin: 0.6 mg/dL (ref 0.3–1.2)
Total Protein: 7.2 g/dL (ref 6.5–8.1)

## 2020-12-04 LAB — MAGNESIUM: Magnesium: 3.9 mg/dL — ABNORMAL HIGH (ref 1.7–2.4)

## 2020-12-04 MED ORDER — IBUPROFEN 400 MG PO TABS
600.0000 mg | ORAL_TABLET | Freq: Once | ORAL | Status: AC
  Administered 2020-12-04: 600 mg via ORAL
  Filled 2020-12-04: qty 1

## 2020-12-04 MED ORDER — ZOLPIDEM TARTRATE 5 MG PO TABS: 5.0000 mg | ORAL_TABLET | Freq: Every evening | ORAL | Status: DC | PRN

## 2020-12-04 MED ORDER — LABETALOL HCL 5 MG/ML IV SOLN: 20.0000 mg | INTRAVENOUS | Status: DC | PRN

## 2020-12-04 MED ORDER — LABETALOL HCL 5 MG/ML IV SOLN
10.0000 mg | Freq: Once | INTRAVENOUS | Status: DC
  Filled 2020-12-04: qty 4

## 2020-12-04 MED ORDER — LABETALOL HCL 5 MG/ML IV SOLN: 40.0000 mg | INTRAVENOUS | Status: DC | PRN

## 2020-12-04 MED ORDER — ACETAMINOPHEN 325 MG PO TABS
650.0000 mg | ORAL_TABLET | ORAL | Status: DC | PRN
  Administered 2020-12-04 (×2): 650 mg via ORAL
  Filled 2020-12-04 (×3): qty 2

## 2020-12-04 MED ORDER — LABETALOL HCL 100 MG PO TABS: 100.0000 mg | ORAL_TABLET | Freq: Once | ORAL | Status: DC

## 2020-12-04 MED ORDER — POTASSIUM CHLORIDE CRYS ER 20 MEQ PO TBCR
40.0000 meq | EXTENDED_RELEASE_TABLET | Freq: Once | ORAL | Status: AC
  Administered 2020-12-04: 40 meq via ORAL
  Filled 2020-12-04: qty 2

## 2020-12-04 MED ORDER — MAGNESIUM SULFATE 40 GM/1000ML IV SOLN
2.0000 g/h | INTRAVENOUS | Status: DC
  Administered 2020-12-04: 2 g/h via INTRAVENOUS
  Filled 2020-12-04: qty 1000

## 2020-12-04 MED ORDER — PRENATAL MULTIVITAMIN CH
1.0000 | ORAL_TABLET | Freq: Every day | ORAL | Status: DC
  Administered 2020-12-05: 1 via ORAL
  Filled 2020-12-04: qty 1

## 2020-12-04 MED ORDER — LACTATED RINGERS IV SOLN: INTRAVENOUS | Status: DC

## 2020-12-04 MED ORDER — LABETALOL HCL 5 MG/ML IV SOLN
20.0000 mg | Freq: Once | INTRAVENOUS | Status: AC
  Administered 2020-12-04: 20 mg via INTRAVENOUS
  Filled 2020-12-04: qty 4

## 2020-12-04 MED ORDER — NICARDIPINE HCL IN NACL 20-0.86 MG/200ML-% IV SOLN
3.0000 mg/h | INTRAVENOUS | Status: DC
Start: 2020-12-04 — End: 2020-12-04
  Administered 2020-12-04: 5 mg/h via INTRAVENOUS
  Filled 2020-12-04 (×2): qty 200

## 2020-12-04 MED ORDER — LABETALOL HCL 5 MG/ML IV SOLN
20.0000 mg | Freq: Once | INTRAVENOUS | Status: AC
  Administered 2020-12-04: 20 mg via INTRAVENOUS

## 2020-12-04 MED ORDER — HYDRALAZINE HCL 20 MG/ML IJ SOLN: 5.0000 mg | INTRAMUSCULAR | Status: DC | PRN

## 2020-12-04 MED ORDER — HYDRALAZINE HCL 20 MG/ML IJ SOLN: 10.0000 mg | INTRAMUSCULAR | Status: DC | PRN

## 2020-12-04 MED ORDER — CALCIUM CARBONATE ANTACID 500 MG PO CHEW: 2.0000 | CHEWABLE_TABLET | ORAL | Status: DC | PRN

## 2020-12-04 MED ORDER — DOCUSATE SODIUM 100 MG PO CAPS
100.0000 mg | ORAL_CAPSULE | Freq: Every day | ORAL | Status: DC
  Filled 2020-12-04 (×2): qty 1

## 2020-12-04 MED ORDER — MAGNESIUM SULFATE 40 GM/1000ML IV SOLN
2.0000 g/h | INTRAVENOUS | Status: AC
  Administered 2020-12-05: 2 g/h via INTRAVENOUS
  Filled 2020-12-04: qty 1000

## 2020-12-04 MED ORDER — GADOBUTROL 1 MMOL/ML IV SOLN
7.0000 mL | Freq: Once | INTRAVENOUS | Status: AC | PRN
  Administered 2020-12-04: 7 mL via INTRAVENOUS

## 2020-12-04 NOTE — ED Notes (Signed)
Bladder scanned pt 252m RN notified

## 2020-12-04 NOTE — H&P (Addendum)
OB ADMISSION/ HISTORY & PHYSICAL:  Admission Date: 12/04/2020  6:26 AM  Admit Diagnosis: PP readmit for Eclampsia. Pt is 10 days PP from SVD on 8/24 d/t IOL for preE without SF.   Pt arrived to the ED via EMS 9/3: presents with seizure activity at home.  She was diagnosed with preeclampsia prior to delivery sent home with nifedipine.  Her significant other states that they checked her blood pressure at home over the course of last 10 days it has been high but she has not liked how the nifedipine and made her feel and has not been compliant.  Otherwise no reports of new illnesses no fever no vomiting cough or diarrhea.  Her partner states that earlier this morning the patient woke up complaining of weakness in the left leg difficulty walking he was laying her down in the bed when she suddenly started developing tonic-clonic shaking episode lasted about 15 minutes.  EMS gave her Versed as well as 4 g of magnesium and brought her to the ER.  No PCR drawn d/t already dx PP with PreE, other labs unremarkable. Normal EEG.  WNL EKG. tx in ER with nicardipine drip to reduced SR BP, started on 2gm/hr of magnesium, required IV labetalol.   MRI 12/04/2020: IMPRESSION: Motion degraded examination, as described in limiting evaluation. Patchy cortical/subcortical T2/FLAIR hyperintense signal abnormality within the left-greater-than-right posterior frontal lobes and bilateral parieto-occipital lobes. Additional T2/FLAIR hyperintense signal abnormality within the posterior limbs of the internal capsules and left-greater-than-right basal ganglia. Probable subtle patchy superimposed enhancement within the bilateral parieto-occipital lobes. 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 However, alternative etiologies cannot be excluded (such as acute encephalitis). Clinical correlation is recommended. Additionally, MRI follow-up is recommended to  assess for stability, progression or resolution of these findings. Subtle diffusion-weighted signal abnormality within the bilateral parieto-occipital lobes, which may reflect complicating ischemia  CT 9/3: IMPRESSION: 1. Abnormal patchy subcortical and deep white matter hypodensity throughout the left greater than right parietal lobes, nonspecific, consider PRES. Neurologic consultation and MRI brain without and with IV contrast is suggested for further evaluation. 2. No evidence of acute intracranial hemorrhage.    HPI from [facility] on 8/23: Monica Green is a 35 y.o. female G1P0 83w4dpresenting for IOL for pre-eclampsia w/o severe features and IGR. Endorses active FM, denies LOF and vaginal bleeding. Denies HA, visual changes, and RUQ pain. IOL was recommended at 37 wks for IUGR and new onset pre-eclampsia. Pt declined induction @ 37 wks and agreed IOL @ 38 wks. Pt did not keep 38 wk IOL appointment and did not respond to calls made by L&D charge RN and on-call CNM. Pt arrived today, presents as stable and asymptomatic for neuro S/S of pre-e. Will repeat CMP and urine PCR. Discussed induction methods and pain management options, questions addressed.   Treated for symptomatic anemia in 2nd trimester with 2 units PRBC and IV iron. Last hematology consult on 09/27/20, H&H 12/36, will repeat CBC today.   History of current pregnancy: G1P0   Patient entered care with [facility] at 19 wks.   EDC 12/03/20 by [place]@ [id number] to uncertain LMP.   Anatomy scan:  19+3 wks, complete w/ posterior placenta.   Antenatal testing: for AMA started at 27+3 weeks Last evaluation: 37+6  wks vtx, posterior fundal placenta, AFI 5.4      (< 3%ile), EFW 5+4 (2.3%ile), BPP/NST 10/10, UA dopplers WNL   Significant prenatal events:  Patient Active Problem List   Diagnosis Date Noted   Eclampsia 12/04/2020   Normal postpartum course 11/26/2020   Term newborn delivered vaginally, current hospitalization 11/24/2020   SVD  (8/24) 11/24/2020   Pre-eclampsia in third trimester 11/23/2020   AMA (advanced maternal age) primigravida 83+, third trimester 11/23/2020   IUGR (intrauterine growth restriction) affecting care of mother 11/23/2020   Iron deficiency anemia 07/29/2020   Thrombocytosis 03/03/2020   Ulcerative colitis ([place]) 04/08/2015    Prenatal Labs: ABO, Rh: --/--/B POS (08/23 1706) Antibody: NEG (08/23 1706) Rubella: Immune (04/11 0000)  RPR: NON REACTIVE (08/23 1809)  HBsAg: Negative (04/11 0000)  HIV: Non-reactive (04/11 0000)  GTT: passed 1 hr GBS: Positive/-- (04/19 0000) positive GC/CHL: neg/neg Genetics: low-risk female, Horizon neg Tdap/influenza vaccines: declined both   OB History  Gravida Para Term Preterm AB Living  '1 1 1     1  '$ SAB IAB Ectopic Multiple Live Births        0 1    # Outcome Date GA Lbr Len/2nd Weight Sex Delivery Anes PTL Lv  1 Term 11/24/20 40w5d02:38 / 00:37 2490 g F Vag-Spont EPI  LIV     Birth Comments: WDL    Medical / Surgical History: Past medical history:  Past Medical History:  Diagnosis Date   Anemia    Pre-eclampsia    [place]ative [place]     Past surgical history:  Past Surgical History:  Procedure Laterality Date   FLEXIBLE SIGMOIDOSCOPY N/A 03/23/2015   Procedure: FLEXIBLE SIGMOIDOSCOPY;  Surgeon: JLaurence Spates MD;  [facility];  Service: Endoscopy;  Laterality: N/A;   Family History:  Family History  Problem Relation Age of Onset   Colon cancer Father     Social History:  reports that she has never smoked. She has never used smokeless tobacco. She reports that she does not currently use alcohol. She reports that she does not use drugs.  Allergies: Patient has no known allergies.   Current Medications at time of admission:  Prior to Admission medications   Medication Sig Start Date End Date Taking? Authorizing Provider  potassium chloride SA (KLOR-CON) 20 MEQ tablet Take 2 tablets (40 mEq total) by mouth 3 (three)  times daily. 11/06/20  Yes [place], JMarveen Reeks CNM  Prenatal Vit-Fe Fumarate-FA (PRENATAL MULTIVITAMIN) TABS tablet Take 1 tablet by mouth daily at 12 noon.   Yes [provider]    Review of Systems: Constitutional: Negative   HENT: Negative   Eyes: Negative   Respiratory: Negative   Cardiovascular: Negative   Gastrointestinal: Negative  Genitourinary: neg for bloody show, neg for LOF   Musculoskeletal: Negative   Skin: Negative   Neurological: Negative   Endo/Heme/Allergies: Negative   Psychiatric/Behavioral: Negative    Physical Exam: VS: Blood pressure 132/87, pulse (!) 102, temperature 98.4 F (36.9 C), temperature source Oral, resp. rate 18, SpO2 97 %, unknown if currently breastfeeding. AAO x3, no signs of distress, appears slightly out of it, most likely r/t valium from EEG.  Physical Exam Constitutional:      Appearance: Normal appearance.     Comments: Patient is now awake and mildly obtunded, slightly slow to answer.  HENT:     Head: Normocephalic.     Nose: Nose normal.  Eyes:     Extraocular Movements: Extraocular movements intact.  Cardiovascular:     Rate and Rhythm: Normal rate.  Pulmonary:     Effort: Pulmonary effort is normal.  Abdominal:  Tenderness: There is abdominal tenderness. There is no guarding.     Comments: Mild tenderness in lower abdominal exam appropriate for history.  Musculoskeletal:        General: Normal range of motion.     Cervical back: Normal range of motion.  Neurological:     Mental Status: She is alert.     Comments: 5/5 strength bilateral upper extremities.  Cranial nerves II to XII intact.  Some weakness noted in the left lower extremity, equivocal.    Prenatal Transfer Tool  Maternal Diabetes: No Genetic Screening: Normal Maternal Ultrasounds/Referrals: IUGR Fetal Ultrasounds or other Referrals:  Referred to Materal Fetal Medicine  Maternal Substance Abuse:  No Significant Maternal Medications:   None Significant Maternal Lab Results: Group B Strep positive    Assessment: Admission Date: 12/04/2020  6:26 AM  Admit Diagnosis: PP readmit for Eclampsia. Pt is 10 days PP from SVD on 8/24 d/t IOL for preE without SF.   Pt arrived to the ED via EMS 9/3: presents with seizure activity at home.  She was diagnosed with preeclampsia prior to delivery sent home with nifedipine.  Her significant other states that they checked her blood pressure at home over the course of last 10 days it has been high but she has not liked how the nifedipine and made her feel and has not been compliant.  Otherwise no reports of new illnesses no fever no vomiting cough or diarrhea.  Her partner states that earlier this morning the patient woke up complaining of weakness in the left leg difficulty walking he was laying her down in the bed when she suddenly started developing tonic-clonic shaking episode lasted about 15 minutes.  EMS gave her Versed as well as 4 g of magnesium and brought her to the ER.  No PCR drawn d/t already dx PP with PreE, other labs unremarkable. Normal EEG.  WNL EKG. tx in ER with nicardipine drip to reduced SR BP, started on 2gm/hr of magnesium, required IV labetalol.   MRI 12/04/2020: IMPRESSION: Motion degraded examination, as described in limiting evaluation. Patchy cortical/subcortical T2/FLAIR hyperintense signal abnormality within the left-greater-than-right posterior frontal lobes and bilateral parieto-occipital lobes. Additional T2/FLAIR hyperintense signal abnormality within the posterior limbs of the internal capsules and left-greater-than-right basal ganglia. Probable subtle patchy superimposed enhancement within the bilateral parieto-occipital lobes. 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 However, alternative etiologies cannot be excluded (such as acute encephalitis). Clinical correlation is  recommended. Additionally, MRI follow-up is recommended to assess for stability, progression or resolution of these findings. Subtle diffusion-weighted signal abnormality within the bilateral parieto-occipital lobes, which may reflect complicating ischemia  CT 9/3: IMPRESSION: 1. Abnormal patchy subcortical and deep white matter hypodensity throughout the left greater than right parietal lobes, nonspecific, consider PRES. Neurologic consultation and MRI brain without and with IV contrast is suggested for further evaluation. 2. No evidence of acute intracranial hemorrhage.   Plan:  Admit to [facility] Continue magnesium 2gm/hr for 24 hours.  Stop nicardipine drip Hydralazine protocol for SR BP Repeat CBC, CMP in morning. Seizure precautions. Neuro checks.  Regular diet.    Dr Alwyn Pea notified of admission and plan of care  [facility] MSN, [place] 12/04/2020 4:03 PM

## 2020-12-04 NOTE — ED Notes (Signed)
Pt returned from MRI, pt resting on stretcher at this time

## 2020-12-04 NOTE — ED Notes (Signed)
EEG at bedside.

## 2020-12-04 NOTE — ED Notes (Signed)
Pt transported to MRI with SWOT nurse

## 2020-12-04 NOTE — Progress Notes (Signed)
Routine EEG completed.  

## 2020-12-04 NOTE — ED Triage Notes (Signed)
Pt from home by EMS for seizure like activity, described as tonic clonic. EMS reported focal seizures on their arrival. EMS gave a total of '5mg'$  versed and '4mg'$  mag. Postpartum 1.5 week (vaginal delivery 8/24). Hx of pre-eclampsia. Rhonchi noted in all fields. EMS VS 190/0, ET 35-45, resp rate 28.

## 2020-12-04 NOTE — ED Provider Notes (Addendum)
[facility] EMERGENCY DEPARTMENT Provider Note   CSN: [id number] Arrival date & time: 12/04/20  [id number]     History Chief Complaint  Patient presents with   Seizures    Monica Green is a 35 y.o. female.  Patient with history of recent vaginal delivery 10 days ago at [redacted] weeks gestation, presents with seizure activity at home.  She was diagnosed with preeclampsia prior to delivery sent home with nifedipine.  Her significant other states that they checked her blood pressure at home over the course of last 10 days it has been high but she has not liked how the nifedipine and made her feel and has not been compliant.  Otherwise no reports of new illnesses no fever no vomiting cough or diarrhea.  Her partner states that earlier this morning the patient woke up complaining of weakness in the left leg difficulty walking he was laying her down in the bed when she suddenly started developing tonic-clonic shaking episode lasted about 15 minutes.  EMS gave her Versed as well as 4 g of magnesium and brought her to the ER.      Past Medical History:  Diagnosis Date   Anemia    Pre-eclampsia    Ulcerative colitis [facility])     Patient Active Problem List   Diagnosis Date Noted   Normal postpartum course 11/26/2020   Term newborn delivered vaginally, current hospitalization 11/24/2020   SVD (8/24) 11/24/2020   Pre-eclampsia in third trimester 11/23/2020   AMA (advanced maternal age) primigravida 87+, third trimester 11/23/2020   IUGR (intrauterine growth restriction) affecting care of mother 11/23/2020   Iron deficiency anemia 07/29/2020   Thrombocytosis 03/03/2020   Ulcerative colitis ([place]) 04/08/2015    Past Surgical History:  Procedure Laterality Date   FLEXIBLE SIGMOIDOSCOPY N/A 03/23/2015   Procedure: FLEXIBLE SIGMOIDOSCOPY;  Surgeon: Laurence Spates, MD;  Location: [facility];  Service: Endoscopy;  Laterality: N/A;     OB History     Gravida  1   Para  1    Term  1   Preterm      AB      Living  1      SAB      IAB      Ectopic      Multiple  0   Live Births  1           Family History  Problem Relation Age of Onset   Colon cancer Father     Social History   Tobacco Use   Smoking status: Never   Smokeless tobacco: Never  Vaping Use   Vaping Use: Never used  Substance Use Topics   Alcohol use: Not Currently    Comment: Social   Drug use: No    Home Medications Prior to Admission medications   Medication Sig Start Date End Date Taking? Authorizing Provider  acetaminophen (TYLENOL) 500 MG tablet Take 1,000 mg by mouth every 6 (six) hours as needed for moderate pain or headache.   Yes [provider]  aspirin-acetaminophen-caffeine (EXCEDRIN MIGRAINE) [id number] MG tablet Take 2 tablets by mouth every 6 (six) hours as needed for headache.   Yes [provider]  ibuprofen (ADVIL) 600 MG tablet Take 1 tablet (600 mg total) by mouth every 6 (six) hours. Patient taking differently: Take 600 mg by mouth every 6 (six) hours as needed for mild pain. 11/26/20  Yes [place], Jade, FNP  NIFEdipine (ADALAT CC) 30 MG 24 hr tablet Take  1 tablet (30 mg total) by mouth daily. 11/27/20  Yes [place], Luvenia Starch, FNP  Prenatal Vit-Fe Fumarate-FA (PRENATAL MULTIVITAMIN) TABS tablet Take 1 tablet by mouth daily.   Yes [provider]    Allergies    Patient has no known allergies.  Review of Systems   Review of Systems  Constitutional:  Negative for fever.  HENT:  Negative for ear pain.   Eyes:  Negative for pain.  Respiratory:  Negative for cough.   Cardiovascular:  Negative for chest pain.  Gastrointestinal:  Negative for diarrhea.  Genitourinary:  Negative for flank pain.  Musculoskeletal:  Negative for back pain.  Skin:  Negative for rash.  Neurological:  Negative for headaches.   Physical Exam Updated Vital Signs BP 126/84   Pulse 99   Temp 98.2 F (36.8 C) (Oral)   Resp 16   SpO2 99%    Breastfeeding Unknown   Physical Exam Constitutional:      Appearance: Normal appearance.     Comments: Patient is now awake and mildly obtunded, slightly slow to answer.  HENT:     Head: Normocephalic.     Nose: Nose normal.  Eyes:     Extraocular Movements: Extraocular movements intact.  Cardiovascular:     Rate and Rhythm: Normal rate.  Pulmonary:     Effort: Pulmonary effort is normal.  Abdominal:     Tenderness: There is abdominal tenderness. There is no guarding.     Comments: Mild tenderness in lower abdominal exam appropriate for history.  Musculoskeletal:        General: Normal range of motion.     Cervical back: Normal range of motion.  Neurological:     Mental Status: She is alert.     Comments: 5/5 strength bilateral upper extremities.  Cranial nerves II to XII intact.  Some weakness noted in the left lower extremity, equivocal.    ED Results / Procedures / Treatments   Labs (all labs ordered are listed, but only abnormal results are displayed) Labs Reviewed  COMPREHENSIVE METABOLIC PANEL - Abnormal; Notable for the following components:      Result Value   Potassium 3.1 (*)    CO2 14 (*)    BUN 5 (*)    Calcium 8.7 (*)    Albumin 3.0 (*)    All other components within normal limits  CBC WITH DIFFERENTIAL/PLATELET - Abnormal; Notable for the following components:   Platelets 455 (*)    nRBC 0.3 (*)    Abs Immature Granulocytes 0.46 (*)    All other components within normal limits  URINALYSIS, ROUTINE W REFLEX MICROSCOPIC - Abnormal; Notable for the following components:   Color, Urine STRAW (*)    APPearance HAZY (*)    Specific Gravity, Urine 1.004 (*)    Hgb urine dipstick LARGE (*)    Protein, ur 30 (*)    Leukocytes,Ua LARGE (*)    Bacteria, UA RARE (*)    All other components within normal limits  RAPID URINE DRUG SCREEN, HOSP PERFORMED - Abnormal; Notable for the following components:   Benzodiazepines POSITIVE (*)    All other components within  normal limits  MAGNESIUM - Abnormal; Notable for the following components:   Magnesium 3.9 (*)    All other components within normal limits  RESP PANEL BY RT-PCR (FLU A&B, COVID) ARPGX2  ETHANOL  PROTIME-INR    EKG None  Radiology CT HEAD WO CONTRAST (5MM)  Result Date: 12/04/2020 CLINICAL DATA:  Mental status change.  Seizure like activity, tonic clonic. Postpartum, history of preeclampsia. EXAM: CT HEAD WITHOUT CONTRAST TECHNIQUE: Contiguous axial images were obtained from the base of the skull through the vertex without intravenous contrast. COMPARISON:  None. FINDINGS: Brain: Abnormal patchy subcortical and deep white matter hypodensity throughout the left greater than right parietal lobes. No evidence of parenchymal hemorrhage or extra-axial fluid collection. No mass lesion, mass effect, or midline shift. Cerebral volume is age appropriate. No ventriculomegaly. Vascular: No acute abnormality. Skull: No evidence of calvarial fracture. Sinuses/Orbits: The visualized paranasal sinuses are essentially clear. Other:  The mastoid air cells are unopacified. IMPRESSION: 1. Abnormal patchy subcortical and deep white matter hypodensity throughout the left greater than right parietal lobes, nonspecific, consider PRES. Neurologic consultation and MRI brain without and with IV contrast is suggested for further evaluation. 2. No evidence of acute intracranial hemorrhage. Electronically Signed   By: Ilona Sorrel M.D.   On: 12/04/2020 07:23   MR BRAIN W WO CONTRAST  Result Date: 12/04/2020 CLINICAL DATA:  Seizure, abnormal neuro exam. EXAM: MRI HEAD WITHOUT AND WITH CONTRAST TECHNIQUE: Multiplanar, multiecho pulse sequences of the brain and surrounding structures were obtained without and with intravenous contrast. CONTRAST:  3m GADAVIST GADOBUTROL 1 MMOL/ML IV SOLN COMPARISON:  Head CT 12/04/2020. FINDINGS: Brain: Intermittently motion degraded examination, limiting evaluation. Most notably, there is moderate  motion degradation of the coronal thin T2/FLAIR imaging, moderate/severe motion degradation of the thin coronal T2 TSE sequence, moderate motion degradation of the axial T1 weighted postcontrast sequence and moderate motion degradation of the coronal T1 weighted postcontrast sequence. There is patchy cortical/subcortical T2/FLAIR hyperintense signal abnormality within the left-greater-than-right posterior frontal lobes and bilateral parietooccipital lobes. Subtle superimposed diffusion-weighted signal abnormality within portions of the bilateral parieto-occipital lobes. Additionally, there may be subtle patchy enhancement within the bilateral parieto-occipital lobes (although evaluation is limited by motion degradation of the postcontrast imaging. Additional T2/FLAIR hyperintense signal abnormality within the posterior limbs of the right internal capsules (greater on the right). Additional signal abnormality within the left-greater-than-right basal ganglia. No extra-axial fluid collection. No midline shift. Vascular: Maintained flow voids within the proximal large arterial vessels. Skull and upper cervical spine: No focal suspicious marrow lesion. Sinuses/Orbits: Visualized orbits show no acute finding. No significant paranasal sinus disease. IMPRESSION: Motion degraded examination, as described in limiting evaluation. Patchy cortical/subcortical T2/FLAIR hyperintense signal abnormality within the left-greater-than-right posterior frontal lobes and bilateral parieto-occipital lobes. Additional T2/FLAIR hyperintense signal abnormality within the posterior limbs of the internal capsules and left-greater-than-right basal ganglia. Probable subtle patchy superimposed enhancement within the bilateral parieto-occipital lobes. 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 However, alternative etiologies cannot be excluded (such as acute encephalitis).  Clinical correlation is recommended. Additionally, MRI follow-up is recommended to assess for stability, progression or resolution of these findings. Subtle diffusion-weighted signal abnormality within the bilateral parieto-occipital lobes, which may reflect complicating ischemia. Electronically Signed   By: KKellie SimmeringD.O.   On: 12/04/2020 12:59    Procedures .Critical Care  Date/Time: 12/04/2020 1:57 PM Performed by: HLuna Fuse MD Authorized by: HLuna Fuse MD   Critical care provider statement:    Critical care time (minutes):  60   Critical care time was exclusive of:  Separately billable procedures and treating other patients and teaching time   Critical care was necessary to treat or prevent imminent or life-threatening deterioration of the following conditions:  CNS failure or compromise and circulatory failure   Medications Ordered  in ED Medications  magnesium sulfate 40 grams in SWI 1000 mL OB infusion (2 g/hr Intravenous New Bag/Given 12/04/20 0825)  nicardipine (CARDENE) '20mg'$  in 0.86% saline 235m IV infusion (0.1 mg/ml) (3 mg/hr Intravenous Rate/Dose Change 12/04/20 1101)  labetalol (NORMODYNE) tablet 100 mg (100 mg Oral Not Given 12/04/20 1108)  labetalol (NORMODYNE) injection 20 mg (20 mg Intravenous Given 12/04/20 0731)  potassium chloride SA (KLOR-CON) CR tablet 40 mEq (40 mEq Oral Given 12/04/20 0954)  labetalol (NORMODYNE) injection 20 mg (20 mg Intravenous Given 12/04/20 0815)  ibuprofen (ADVIL) tablet 600 mg (600 mg Oral Given 12/04/20 0954)  gadobutrol (GADAVIST) 1 MMOL/ML injection 7 mL (7 mLs Intravenous Contrast Given 12/04/20 1231)    ED Course  I have reviewed the triage vital signs and the nursing notes.  Pertinent labs & imaging results that were available during my care of the patient were reviewed by me and considered in my medical decision making (see chart for details).    MDM Rules/Calculators/A&P                           Distal moderately elevated here.   Given IV labetalol with some improvement.  Requiring IV nicardipine drip.  CT imaging of the brain concerning for PR ES syndrome.  MRI with and without contrast pursued.  Neurology consultation requested.  Patient seen by neurology, cleared [facility].  EEG completed while here in the ER, to be followed by neurology.  OB Dr PAlwyn Peawho excepted the patient for transfer to women Center.   Final Clinical Impression(s) / ED Diagnoses Final diagnoses:  Seizure disorder in pregnancy, postpartum condition (HConstantine  Hypertension, unspecified type    Rx / DC Orders ED Discharge Orders     None        HLuna Fuse MD 12/04/20 1356    HLuna Fuse MD 12/04/20 1357

## 2020-12-04 NOTE — Procedures (Signed)
History: 35 yo F being evaluated for seizure  Sedation: Versed  Technique: This EEG was acquired with electrodes placed according to the International 10-20 electrode system (including Fp1, Fp2, F3, F4, C3, C4, P3, P4, O1, O2, T3, T4, T5, T6, A1, A2, Fz, Cz, Pz). The following electrodes were missing or displaced: none.  There is some increase in impedances resulting in some electrode artifact, but I feel that the recording is interpretable.   Background: Much of this recording occurs during drowsiness and early sleep, but during the maximal waking state the background consists of intermixed alpha and beta activities. There is a well defined posterior dominant rhythm of 10 hz that attenuates with eye opening.  There is an increase in slow activity associated with drowsiness.  Sleep is recorded with normal appearing structures.   Photic stimulation: Physiologic driving is not performed  EEG Abnormalities: None  Clinical Interpretation: This normal EEG is recorded in the waking and sleep state. There was no seizure or seizure predisposition recorded on this study. Please note that lack of epileptiform activity on EEG does not preclude the possibility of epilepsy.   Roland Rack, MD [facility] [phone]  If 7pm- 7am, please page neurology on call as listed in [place].

## 2020-12-04 NOTE — ED Notes (Signed)
Lab called to add on magnesium.

## 2020-12-04 NOTE — Consult Note (Signed)
NEUROLOGY CONSULTATION NOTE   Date of service: December 04, 2020 Patient Name: Monica Green MRN:  [id number] DOB:  1985-11-30 Reason for consult: seizures in postpartum patient with 3rd trimester pre-eclampsia Requesting physician: Thamas Jaegers MD _ _ _   _ __   _ __ _ _  __ __   _ __   __ _  History of Present Illness   35 year old woman with a history of ulcerative colitis and third trimester diagnosis of preeclampsia who is 9 days postdelivery who presented with a seizure from home.  She is somewhat altered on my initial examination due to postictal state.  Per her partner she woke up in the middle of the night and reported cramping in one of her legs, he is not sure which one.  After that she was witnessed to have a generalized tonic-clonic seizure with unresponsiveness and jerking of all of her extremities after which she was lethargic and confused.  On admission to the ED she was also noted to have some weakness of her left upper and lower extremities.  No seizure history and the weakness is new since the event last night.  She was diagnosed with preeclampsia prior to delivery and sent home with nifedipine.  Her blood pressure has been high at home per partner but she did not like how the nifedipine made her feel and has not been compliant.  EMS gave her Versed as well as 4 g of magnesium and upon arrival to the ED she was put on a magnesium drip as well as nicardipine for elevated blood pressure.  Her blood pressure was systolic [id number] to [id number] upon arrival to the ED.  MRI brain wwo  Patchy cortical/subcortical T2/FLAIR hyperintense signal abnormality within the left-greater-than-right posterior frontal lobes and bilateral parieto-occipital lobes. Additional T2/FLAIR hyperintense signal abnormality within the posterior limbs of the internal capsules and left-greater-than-right basal ganglia. Probable subtle patchy superimposed enhancement within the bilateral parieto-occipital  lobes. 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 However, alternative etiologies cannot be excluded (such as acute encephalitis). Clinical correlation is recommended. Additionally, MRI follow-up is recommended to assess for stability, progression or resolution of these findings.   Subtle diffusion-weighted signal abnormality within the bilateral parieto-occipital lobes, which may reflect complicating ischemia.  CNS imaging personally reviewed  rEEG today in ED WNL   ROS   Per HPI; all other systems reviewed and are negative  Past History   Past Medical History:  Diagnosis Date   Anemia    Pre-eclampsia    Ulcerative colitis ([place])    Past Surgical History:  Procedure Laterality Date   FLEXIBLE SIGMOIDOSCOPY N/A 03/23/2015   Procedure: FLEXIBLE SIGMOIDOSCOPY;  Surgeon: Laurence Spates, MD;  Location: [place];  Service: Endoscopy;  Laterality: N/A;   Family History  Problem Relation Age of Onset   Colon cancer Father    Social History   Socioeconomic History   Marital status: Single    Spouse name: Not on file   Number of children: 0   Years of education: Not on file   Highest education level: Not on file  Occupational History   Not on file  Tobacco Use   Smoking status: Never   Smokeless tobacco: Never  Vaping Use   Vaping Use: Never used  Substance and Sexual Activity   Alcohol use: Not Currently    Comment: Social   Drug use: No   Sexual activity: Yes  Other Topics Concern  Not on file  Social History Narrative   Not on file   Social Determinants of Health   Financial Resource Strain: Not on file  Food Insecurity: Not on file  Transportation Needs: Not on file  Physical Activity: Not on file  Stress: Not on file  Social Connections: Not on file   No Known Allergies  Medications   Medications Prior to Admission  Medication Sig Dispense Refill Last Dose    acetaminophen (TYLENOL) 500 MG tablet Take 1,000 mg by mouth every 6 (six) hours as needed for moderate pain or headache.   12/03/2020   aspirin-acetaminophen-caffeine (EXCEDRIN MIGRAINE) 250-250-65 MG tablet Take 2 tablets by mouth every 6 (six) hours as needed for headache.   12/03/2020   ibuprofen (ADVIL) 600 MG tablet Take 1 tablet (600 mg total) by mouth every 6 (six) hours. (Patient taking differently: Take 600 mg by mouth every 6 (six) hours as needed for mild pain.) 30 tablet 0 Past Week   NIFEdipine (ADALAT CC) 30 MG 24 hr tablet Take 1 tablet (30 mg total) by mouth daily. 30 tablet 0 12/01/2020   Prenatal Vit-Fe Fumarate-FA (PRENATAL MULTIVITAMIN) TABS tablet Take 1 tablet by mouth daily.   12/01/2020     Vitals   Vitals:   12/04/20 1512 12/04/20 1600 12/04/20 1700 12/04/20 1748  BP: 132/87     Pulse: (!) 102     Resp: '18 16 15 16  '$ Temp: [id number] F ([id number] C)     TempSrc: Oral     SpO2: 97%        There is no height or weight on file to calculate BMI.  Physical Exam   Physical Exam Gen: drowsy but oriented x4, no recall of actual seizure HEENT: Atraumatic, normocephalic;mucous membranes moist; oropharynx clear, tongue without atrophy or fasciculations. Neck: Supple, trachea midline. Resp: CTAB, no w/r/r CV: RRR, no m/g/r; nml S1 and S2. 2+ symmetric peripheral pulses. Abd: soft/NT/ND; nabs x 4 quad Extrem: Nml bulk; no cyanosis, clubbing, or edema.  Neuro: *MS: drowsy but oriented x4, no recall of actual seizure *Speech: fluid, nondysarthric, able to name and repeat *CN:    I: Deferred   II,III: PERRLA, VFF by confrontation, optic discs sharp   III,IV,VI: EOMI w/o nystagmus, no ptosis   V: Sensation intact from V1 to V3 to LT   VII: Eyelid closure was full.  Smile symmetric.   VIII: Hearing intact to voice   IX,X: Voice normal, palate elevates symmetrically    XI: SCM/trap 5/5 bilat   XII: Tongue protrudes midline, no atrophy or fasciculations  *Motor: RUE and RLE 5/5  throughout. LUE and LLE 4/5 diffusely. *Sensory: Intact to light touch, pinprick, temperature vibration throughout. Symmetric. Propioception intact bilat.  No double-simultaneous extinction.  *Coordination:  Finger-to-nose, heel-to-shin, rapid alternating motions were intact. *Reflexes:  2+ and symmetric throughout without clonus; toes down-going bilat *Gait: deferred    Labs   CBC:  Recent Labs  Lab 12/04/20 0633  WBC 10.4  NEUTROABS 7.4  HGB 13.8  HCT 44.0  MCV 95.0  PLT 455*    Basic Metabolic Panel:  Lab Results  Component Value Date   NA 140 12/04/2020   K 3.1 (L) 12/04/2020   CO2 14 (L) 12/04/2020   GLUCOSE 83 12/04/2020   BUN 5 (L) 12/04/2020   CREATININE 0.74 12/04/2020   CALCIUM 8.7 (L) 12/04/2020   GFRNONAA >60 12/04/2020   GFRAA >60 03/27/2015   Lipid Panel: No results found for: LDLCALC HgbA1c: No results  found for: HGBA1C Urine Drug Screen:     Component Value Date/Time   LABOPIA NONE DETECTED 12/04/2020 0633   COCAINSCRNUR NONE DETECTED 12/04/2020 0633   LABBENZ POSITIVE (A) 12/04/2020 0633   AMPHETMU NONE DETECTED 12/04/2020 0633   THCU NONE DETECTED 12/04/2020 0633   LABBARB NONE DETECTED 12/04/2020 0633    Alcohol Level     Component Value Date/Time   ETH <10 12/04/2020 [id number]     Impression   35 year old woman with a history of ulcerative colitis and third trimester diagnosis of preeclampsia who is 9 days postdelivery who presented with a seizure from home. Clinical presentation consistent with eclampsia. 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 It is somewhat unusual to have an asymmetric Todd's paralysis after a seizure from eclampsia without an underlying structural lesion, but there was none that I could see on the MRI. rEEG was normal. Recommend Mg++ infusion x24 hrs and admission to OB with control of blood pressure goal SBP <140. Will re-evaluate tomorrow. She will need f/u MRI brain wwo in 3  mos with neurology after the PRES has resolved to r/o underlying structural lesion. If she develops seizures not controlled with Mg++ titration would load with keppra '20mg'$ /kg IV and start '500mg'$  bid, but will hold off unless further seizure events.  Recommendations   - Recommend Mg++ infusion x24 hrs and admission to OB with control of blood pressure goal SBP <140.  - Will re-evaluate tomorrow.  - She will need f/u MRI brain wwo in 3 mos with neurology after the PRES has resolved to r/o underlying structural lesion.  - If she develops seizures not controlled with Mg++ titration would load with keppra '20mg'$ /kg IV and start '500mg'$  bid, but will hold off unless further seizure events. ______________________________________________________________________   Thank you for the opportunity to take part in the care of this patient. If you have any further questions, please contact the neurology consultation attending.  Signed,  Su Monks, MD [facility] [phone]  If 7pm- 7am, please page neurology on call as listed in [place].

## 2020-12-05 LAB — MAGNESIUM: Magnesium: 8.4 mg/dL (ref 1.7–2.4)

## 2020-12-05 MED ORDER — LEVETIRACETAM IN NACL 1500 MG/100ML IV SOLN
1500.0000 mg | Freq: Once | INTRAVENOUS | Status: AC
  Administered 2020-12-05: 1500 mg via INTRAVENOUS
  Filled 2020-12-05: qty 100

## 2020-12-05 MED ORDER — LABETALOL HCL 100 MG PO TABS
100.0000 mg | ORAL_TABLET | Freq: Two times a day (BID) | ORAL | Status: DC
  Administered 2020-12-05 – 2020-12-06 (×3): 100 mg via ORAL
  Filled 2020-12-05 (×3): qty 1

## 2020-12-05 MED ORDER — LEVETIRACETAM 500 MG PO TABS
500.0000 mg | ORAL_TABLET | Freq: Two times a day (BID) | ORAL | Status: DC
  Administered 2020-12-05 – 2020-12-06 (×2): 500 mg via ORAL
  Filled 2020-12-05 (×4): qty 1

## 2020-12-05 NOTE — Progress Notes (Addendum)
Neurology progress note  Patient ID: 35 yo woman w/ hx ulcerative colitis and 3rd trimester pre-eclampsia presents with seizure 9 days after delivery.  Subjective: Patient is more awake today but still drowsy. Her L sided weakness has resolved but she remains v unsteady on her feet, which is new for her.   O:  Vitals:   12/05/20 1208 12/05/20 1647  BP: 128/87 133/78  Pulse:  65  Resp: 18 17  Temp: 98.5 F (36.9 C) 98.7 F (37.1 C)  SpO2: 98% 100%    Physical Exam Gen: awake but drowsy, oriented x4 HEENT: Atraumatic, normocephalic; oropharynx clear, tongue without atrophy or fasciculations. Resp: CTAB, normal work of breathing CV: RRR, extremities appear well-perfused. Abd: soft/NT/ND Extrem: Nml bulk; no cyanosis, clubbing, or edema.  Neuro: *MS: awake but drowsy, oriented x4 *Speech: no dysarthria or aphasia, able to name and repeat. *CN:    I: Deferred   II,III: PERRLA, VFF by confrontation, optic discs not visualized 2/2 pupillary constriction   III,IV,VI: EOMI w/o nystagmus, no ptosis   V: Sensation intact from V1 to V3 to LT   VII: Eyelid closure was full.  Smile symmetric.   VIII: Hearing intact to voice   IX,X: Voice normal, palate elevates symmetrically    XI: SCM/trap 5/5 bilat   XII: Tongue protrudes midline, no atrophy or fasciculations  *Motor:   Normal bulk.  No tremor, rigidity or bradykinesia. No pronator drift.   Strength: Dlt Bic Tri WE WrF FgS Gr HF KnF KnE PlF DoF    Left '5 5 5 5 5 5 5 5 5 5 5 5    '$ Right '5 5 5 5 5 5 5 5 5 5 5 5   '$ *Sensory: Intact to light touch, pinprick, temperature vibration throughout. Symmetric. Propioception intact bilat.  No double-simultaneous extinction.  *Coordination:  Finger-to-nose, heel-to-shin, rapid alternating motions were intact. *Reflexes:  2+ and symmetric throughout without clonus; toes down-going bilat *Gait: very unsteady on her feet when walking without frank ataxia  MRI brain wwo contrast  Motion degraded  examination, as described in limiting evaluation.   Patchy cortical/subcortical T2/FLAIR hyperintense signal abnormality within the left-greater-than-right posterior frontal lobes and bilateral parieto-occipital lobes. Additional T2/FLAIR hyperintense signal abnormality within the posterior limbs of the internal capsules and left-greater-than-right basal ganglia. Probable subtle patchy superimposed enhancement within the bilateral parieto-occipital lobes. 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 However, alternative etiologies cannot be excluded (such as acute encephalitis). Clinical correlation is recommended. Additionally, MRI follow-up is recommended to assess for stability, progression or resolution of these findings.   Subtle diffusion-weighted signal abnormality within the bilateral parieto-occipital lobes, which may reflect complicating ischemia.  CNS imaging personally reviewed.  A/P: 35 year old woman with a history of ulcerative colitis and third trimester diagnosis of preeclampsia who is 9 days postdelivery who presented with a seizure from home. Clinical presentation consistent with eclampsia. 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 It is somewhat unusual to have an asymmetric Todd's paralysis after a seizure from eclampsia without an underlying structural lesion, but there was none that I could see on the MRI (although somewhat limited by motion artifact). rEEG was normal.   On re-examination this AM, patient's mental status is improved but she remains drowsy and not yet back to baseline. She has no focal neurologic deficits today but is quite unsteady on her feet which is new.  Recommendations: - S/p keppra '20mg'$ /kg load to be  f/b '500mg'$  po bid for at least 3 mos and until cleared by outpatient neurology - Will re-examine tmrw. If not back to baseline  (mental status and gait) recommend repeat MRI brain wwo contrast. Imaging yesterday was somewhat motion-degraded. - She will need f/u MRI brain wwo in 3 mos with neurology after the PRES has resolved to r/o underlying structural lesion. Amb referral at hospital discharge. - BP control goal SBP <160. Discharge oral antihypertensive choice per OB - Pt counseled that per [place] law she may not drive for at least 6 months after last seizure. She was also counseled not to operate heavy machinery, climb ladders, swim or bathe alone (showers are ok) or do any other activities that might be dangerous if she had a seizure while doing them.  Will continue to follow.  Su Monks, MD [facility] [phone]  If 7pm- 7am, please page neurology on call as listed in [place].

## 2020-12-05 NOTE — Progress Notes (Signed)
PPD# 10 SVD, Eclampsia 12/04/20 Information for the patient's newborn:  Monica Green [id number]  female    S:   Reports feeling "much better today" S/P eclamptic seizure 12/04/20. Denies HA, visual disturbances and epigastric pain. Continues to c/o of some weakness when she gets up. Mother at bedside.    O:   VS: BP (!) 156/96 (BP Location: Left Arm)   Pulse 70   Temp 98.1 F (36.7 C) (Oral)   Resp 17   SpO2 98%   Breastfeeding Unknown   LABS:  Recent Labs    12/04/20 0633  WBC 10.4  HGB 13.8  PLT 455*   Blood type: --/--/B POS (08/23 1706) Rubella: Immune (04/11 0000)                      I&O: Intake/Output      09/03 0701 09/04 0700 09/04 0701 09/05 0700   P.O. 1800    I.V. 2162.2    Other 0    Total Intake 3962.2    Urine 3050    Total Output 3050    Net +912.2         Urine Occurrence 2 x      Physical Exam: Alert and oriented X3 Lungs: Clear and unlabored Heart: regular rate and rhythm / no mumurs Abdomen: soft, non-tender, non-distended  Fundus: firm, non-tender Lochia: appropriate Extremities: negative edema, no calf pain, tenderness, or cords DTR 2+/2+, negative clonus   A:  PPD # 10  S/P eclamptic seizure on 12/04/20 Hx of preeclampsia with pregnancy Unsteady gait, unsure spatial movements such as reaching for fork and picking it up. Questions asked and answered appropriately.  Neurology consult complete  P:  S/P MgSO4 x 24 hours Start Labetalol '100mg'$  BID today Start Keppra per neurology PT ordered MRI 12/04/20 complete CT 12/04/20 complete Anticipate D/C on 12/06/20   Plan reviewed w/ Dr. Modena Jansky, MSN, CNM 12/05/2020, 11:18 AM

## 2020-12-05 NOTE — Progress Notes (Signed)
Date and time results received: 12/05/20 11:12am   Test: Magnesium Critical Value: 8.4  Name of Provider Notified: Dr. Thayer Dallas Holshauser,CNM  Orders Received? Or Actions Taken?: No new orders.

## 2020-12-06 DIAGNOSIS — G40909 Epilepsy, unspecified, not intractable, without status epilepticus: Secondary | ICD-10-CM

## 2020-12-06 DIAGNOSIS — O99355 Diseases of the nervous system complicating the puerperium: Secondary | ICD-10-CM

## 2020-12-06 MED ORDER — LABETALOL HCL 100 MG PO TABS: 100.0000 mg | ORAL_TABLET | Freq: Two times a day (BID) | ORAL | 3 refills | Status: DC

## 2020-12-06 MED ORDER — LEVETIRACETAM 500 MG PO TABS: 500.0000 mg | ORAL_TABLET | Freq: Two times a day (BID) | ORAL | 3 refills | Status: DC

## 2020-12-06 MED ORDER — PRENATAL MULTIVITAMIN CH: 1.0000 | ORAL_TABLET | Freq: Every day | ORAL | 3 refills | Status: AC

## 2020-12-06 NOTE — Progress Notes (Addendum)
Subjective: No acute events overnight.  No further seizures.  Patient states her balance is back to baseline.  Still states she has some trouble remembering everything that happened.  Denies any other concerns.   ROS: negative except above  Examination  Vital signs in last 24 hours: Temp:  [98 F (36.7 C)-98.7 F (37.1 C)] 98.4 F (36.9 C) (09/05 0805) Pulse Rate:  [57-67] 57 (09/05 0805) Resp:  [14-18] 16 (09/05 0805) BP: (101-149)/(73-81) 148/80 (09/05 0805) SpO2:  [96 %-100 %] 96 % (09/05 0805)  General: lying in bed, NAD CVS: pulse-normal rate and rhythm RS: breathing comfortably Extremities: normal   Neuro: MS: Alert, oriented, follows commands CN: pupils equal and reactive,  EOMI, face symmetric, tongue midline, normal sensation over face, Motor: 5/5 strength in all 4 extremities Reflexes: 2+ bilaterally over patella, biceps, plantars: flexor Coordination: normal Gait: No evidence of ataxia  Basic Metabolic Panel: Recent Labs  Lab 12/04/20 0633 12/05/20 0532  NA 140  --   K 3.1*  --   CL 111  --   CO2 14*  --   GLUCOSE 83  --   BUN 5*  --   CREATININE 0.74  --   CALCIUM 8.7*  --   MG 3.9* 8.4*    CBC: Recent Labs  Lab 12/04/20 0633  WBC 10.4  NEUTROABS 7.4  HGB 13.8  HCT 44.0  MCV 95.0  PLT 455*     Coagulation Studies: Recent Labs    12/04/20 0715  LABPROT 14.0  INR 1.1    Imaging MRI brain with and without contrast 12/04/2020: Patchy cortical/subcortical T2/FLAIR hyperintense signal abnormality within the left-greater-than-right posterior frontal lobes and bilateral parieto-occipital lobes. Additional T2/FLAIR hyperintense signal abnormality within the posterior limbs of the internal capsules and left-greater-than-right basal ganglia. Probable subtle patchy superimposed enhancement within the bilateral parieto-occipital lobes. These findings are favored to reflect posterior reversal encephalopathy syndrome (PRES) given the  provided history. However, alternative etiologies cannot be excluded (such as acute encephalitis). Clinical correlation is recommended. Additionally, MRI follow-up is recommended to assess for stability, progression or resolution of these findings.   Subtle diffusion-weighted signal abnormality within the bilateral parieto-occipital lobes, which may reflect complicating ischemia.      ASSESSMENT AND PLAN:  35 year old woman with a history of ulcerative colitis and third trimester diagnosis of preeclampsia who is 9 days postdelivery who presented with a seizure from home. Clinical presentation consistent with eclampsia. 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  PRES  Provoked seizures Eclampsia Thrombocytosis Hypokalemia Hypermagnesemia Hypoalbuminemia -No further seizures overnight.  Patient most likely had provoked seizure in setting of PRES, eclampsia  Recommendation -Continue Keppra 500 mg twice daily -Recommend follow-up with neurology in 4 to 6 weeks.  Patient would like to see Dr. April Manson at [facility] for possible -Will likely need repeat MRI brain without contrast to assess for improvement in MRI changes -Discussed seizure precautions in detail including do not drive for 3 months -Discussed side effects of Keppra including irritability.  Also discussed that it is Keppra can be found in the breastmilk.  However, it is not necessarily a reason to discontinue breast-feeding. Discussed potential side effects of Keppra on baby including but not limited to excessive drowsiness, irritability, etc. -Discussed to return to ER if any further seizures, side effects of medications -Management of rest of comorbidities per primary team -Discussed my plan with Dr. Alwyn Pea and patient's RN  Seizure precautions: Per [facility] statutes, patients  with seizures are not allowed to drive until they have been seizure-free for  six months and cleared by a physician    Use caution when using heavy equipment or power tools. Avoid working on ladders or at heights. Take showers instead of baths. Ensure the water temperature is not too high on the home water heater. Do not go swimming alone. Do not lock yourself in a room alone (i.e. bathroom). When caring for infants or small children, sit down when holding, feeding, or changing them to minimize risk of injury to the child in the event you have a seizure. Maintain good sleep hygiene. Avoid alcohol.    If patient has another seizure, call 911 and bring them back to the ED if: A.  The seizure lasts longer than 5 minutes.      B.  The patient doesn't wake shortly after the seizure or has new problems such as difficulty seeing, speaking or moving following the seizure C.  The patient was injured during the seizure D.  The patient has a temperature over 102 F (39C) E.  The patient vomited during the seizure and now is having trouble breathing    During the Seizure   - First, ensure adequate ventilation and place patients on the floor on their left side  Loosen clothing around the neck and ensure the airway is patent. If the patient is clenching the teeth, do not force the mouth open with any object as this can cause severe damage - Remove all items from the surrounding that can be hazardous. The patient may be oblivious to what's happening and may not even know what he or she is doing. If the patient is confused and wandering, either gently guide him/her away and block access to outside areas - Reassure the individual and be comforting - Call 911. In most cases, the seizure ends before EMS arrives. However, there are cases when seizures may last over 3 to 5 minutes. Or the individual may have developed breathing difficulties or severe injuries. If a pregnant patient or a person with diabetes develops a seizure, it is prudent to call an ambulance. - Finally, if the patient does  not regain full consciousness, then call EMS. Most patients will remain confused for about 45 to 90 minutes after a seizure, so you must use judgment in calling for help. - Avoid restraints but make sure the patient is in a bed with padded side rails - Place the individual in a lateral position with the neck slightly flexed; this will help the saliva drain from the mouth and prevent the tongue from falling backward - Remove all nearby furniture and other hazards from the area - Provide verbal assurance as the individual is regaining consciousness - Provide the patient with privacy if possible - Call for help and start treatment as ordered by the caregiver    After the Seizure (Postictal Stage)   After a seizure, most patients experience confusion, fatigue, muscle pain and/or a headache. Thus, one should permit the individual to sleep. For the next few days, reassurance is essential. Being calm and helping reorient the person is also of importance.   Most seizures are painless and end spontaneously. Seizures are not harmful to others but can lead to complications such as stress on the lungs, brain and the heart. Individuals with prior lung problems may develop labored breathing and respiratory distress.    I have spent a total of  45 minutes with the patient reviewing hospital notes,  test results, labs and examining the patient as well as establishing an assessment and plan that was discussed personally with the patient, patient's mother at bedside.  > 50% of time was spent in direct patient care.       Zeb Comfort Epilepsy [facility] For questions after 5pm please refer to AMION to reach the Neurologist on call

## 2020-12-06 NOTE — Discharge Summary (Signed)
Postpartum REadmission Discharge Summary  Date of Service updated 12/06/2020     Patient Name: Monica Green DOB: [birth year] MRN: [id number]  Date of admission: 12/04/2020 Delivery date:11/24/2020  Delivering provider: Holli Humbles B  Date of discharge: 12/06/2020  Admitting diagnosis: Eclampsia [O15.9] Seizure disorder in pregnancy, postpartum condition ([place]) [O99.355, G40.909] Hypertension, unspecified type [I10] Intrauterine pregnancy: [redacted]w[redacted]d    Secondary diagnosis:  Active Problems:   Eclampsia  Additional problems: post partum preclampsia;  PRES    Discharge diagnosis:  eclampsia                                               Post partum procedures: MRI  Hospital course: Patient readmitted for eclamptic seizure at home. Patient given magnesium sulfate IV bolus for seizure prophylaxis and severe range blood pressures managed by IV anti-hypertensives. Neurology consultation obtained and MRI/MRA done that demonstrated PRES. Patient stabilized and discharged on HD# 3  Magnesium Sulfate received: Yes: Seizure prophylaxis   Physical exam  Vitals:   12/05/20 2232 12/05/20 2310 12/06/20 0423 12/06/20 0805  BP: 131/73 101/81 (!) 149/78 (!) 148/80  Pulse: 67  67 (!) 57  Resp:  '16 14 16  '$ Temp:  98.5 F (36.9 C) 98.3 F (36.8 C) 98.4 F (36.9 C)  TempSrc:  Oral Oral Oral  SpO2:  97% 96% 96%   General: alert, cooperative, appears stated age, and no distress GI: soft, non-tender; bowel sounds normal; no masses,  no organomegaly and normal findings: soft, non-tender Extremities: extremities normal, atraumatic, no cyanosis or edema, Homans sign is negative, no sign of DVT, and no edema, redness or tenderness in the calves or thighs Vaginal Bleeding: minimal Labs: Lab Results  Component Value Date   WBC 10.4 12/04/2020   HGB 13.8 12/04/2020   HCT 44.0 12/04/2020   MCV 95.0 12/04/2020   PLT 455 (H) 12/04/2020   CMP Latest Ref Rng & Units 12/04/2020  Glucose 70 -  99 mg/dL 83  BUN 6 - 20 mg/dL 5(L)  Creatinine 0.44 - 1.00 mg/dL 0.74  Sodium 135 - 145 mmol/L 140  Potassium 3.5 - 5.1 mmol/L 3.1(L)  Chloride 98 - 111 mmol/L 111  CO2 22 - 32 mmol/L 14(L)  Calcium 8.9 - 10.3 mg/dL 8.7(L)  Total Protein 6.5 - 8.1 g/dL 7.2  Total Bilirubin 0.3 - 1.2 mg/dL 0.6  Alkaline Phos 38 - 126 U/L 83  AST 15 - 41 U/L 26  ALT 0 - 44 U/L 19   Edinburgh Score: Edinburgh Postnatal Depression Scale Screening Tool 11/25/2020  I have been able to laugh and see the funny side of things. 0  I have looked forward with enjoyment to things. 1  I have blamed myself unnecessarily when things went wrong. 0  I have been anxious or worried for no good reason. 1  I have felt scared or panicky for no good reason. 0  Things have been getting on top of me. 0  I have been so unhappy that I have had difficulty sleeping. 0  I have felt sad or miserable. 0  I have been so unhappy that I have been crying. 0  The thought of harming myself has occurred to me. 0  Edinburgh Postnatal Depression Scale Total 2      After visit meds:  Allergies as of 12/06/2020   No Known  Allergies      Medication List     STOP taking these medications    aspirin-acetaminophen-caffeine 250-250-65 MG tablet Commonly known as: EXCEDRIN MIGRAINE   NIFEdipine 30 MG 24 hr tablet Commonly known as: ADALAT CC       TAKE these medications    acetaminophen 500 MG tablet Commonly known as: TYLENOL Take 1,000 mg by mouth every 6 (six) hours as needed for moderate pain or headache.   ibuprofen 600 MG tablet Commonly known as: ADVIL Take 1 tablet (600 mg total) by mouth every 6 (six) hours. What changed:  when to take this reasons to take this   labetalol 100 MG tablet Commonly known as: NORMODYNE Take 1 tablet (100 mg total) by mouth 2 (two) times daily.   levETIRAcetam 500 MG tablet Commonly known as: KEPPRA Take 1 tablet (500 mg total) by mouth 2 (two) times daily.   prenatal  multivitamin Tabs tablet Take 1 tablet by mouth daily at 12 noon. What changed: when to take this         Discharge home in stable condition Discharge instruction: No driving, watch for signs of preeclampsia Activity: No driving or operating heavy machinery no baths. Pelvic rest for 6 weeks.  Diet: routine diet  Postpartum Appointment:1 week Additional Postpartum F/U: BP check 1 week Future Appointments: Future Appointments  Date Time Provider [place]  04/07/2021 10:00 AM [facility] None  04/07/2021 10:40 AM Brunetta Genera, MD [facility] None   Follow up Visit: Neurology - 3-4 weeks     12/06/2020 Sanjuana Kava, MD

## 2020-12-06 NOTE — Progress Notes (Signed)
MD Progress Note  Patient ID: Monica Green, female   DOB: [birth year], 35 y.o.   MRN: [id number]   Monica Green is an 35 y.o. 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 Patient today desires to go home. She has no complaints: no headache, no blurry vision, no RUQ pain. She denies nausea or vomiting, no chest pain or shortness of breath. She reports normal lochia.    Past Medical History:  Diagnosis Date   Anemia    Pre-eclampsia    Ulcerative colitis ([place])     Past Surgical History:  Procedure Laterality Date   FLEXIBLE SIGMOIDOSCOPY N/A 03/23/2015   Procedure: FLEXIBLE SIGMOIDOSCOPY;  Surgeon: Laurence Spates, MD;  Location: [facility] ENDOSCOPY;  Service: Endoscopy;  Laterality: N/A;    Family History  Problem Relation Age of Onset   Colon cancer Father     Social History:  reports that she has never smoked. She has never used smokeless tobacco. She reports that she does not currently use alcohol. She reports that she does not use drugs.  Allergies: No Known Allergies  Medications: I have reviewed the patient's current medications.  ROS as per HPI  Family History: Non contributory  Blood pressure (!) 148/80, pulse (!) 57, temperature 98.4 F (36.9 C), temperature source Oral, resp. rate 16, SpO2 96 %, unknown if currently breastfeeding.  General: alert, cooperative, appears stated age, and no distress GI: soft, non-tender; bowel sounds normal; no masses,  no organomegaly and normal findings: soft, non-tender Extremities: extremities normal, atraumatic, no cyanosis or edema, Homans sign is negative, no sign of DVT, and no edema, redness or tenderness in the calves or thighs Vaginal Bleeding: minimal  Exam deferred.  Results for orders placed or performed during the hospital encounter of 12/04/20 (from the past 48 hour(s))  Magnesium     Status: Abnormal    Collection Time: 12/05/20  5:32 AM  Result Value Ref Range   Magnesium 8.4 (HH) 1.7 - 2.4 mg/dL    Comment: RESULTS CONFIRMED BY MANUAL DILUTION CRITICAL RESULT CALLED TO, READ BACK BY AND VERIFIED WITH: H.PEGRAM RN @ [id number] 12/05/2020 BY C.EDENS Performed at [facility], [address], [place], [place] [address]     MR BRAIN W WO CONTRAST  Result Date: 12/04/2020 CLINICAL DATA:  Seizure, abnormal neuro exam. EXAM: MRI HEAD WITHOUT AND WITH CONTRAST TECHNIQUE: Multiplanar, multiecho pulse sequences of the brain and surrounding structures were obtained without and with intravenous contrast. CONTRAST:  22m GADAVIST GADOBUTROL 1 MMOL/ML IV SOLN COMPARISON:  Head CT 12/04/2020. FINDINGS: Brain: Intermittently motion degraded examination, limiting evaluation. Most notably, there is moderate motion degradation of the coronal thin T2/FLAIR imaging, moderate/severe motion degradation of the thin coronal T2 TSE sequence, moderate motion degradation of the axial T1 weighted postcontrast sequence and moderate motion degradation of the coronal T1 weighted postcontrast sequence. There is patchy cortical/subcortical T2/FLAIR hyperintense signal abnormality within the left-greater-than-right posterior frontal lobes and bilateral parietooccipital lobes. Subtle superimposed diffusion-weighted signal abnormality within portions of the bilateral parieto-occipital lobes. Additionally, there may be subtle patchy enhancement within the bilateral parieto-occipital lobes (although evaluation is limited by motion degradation of the postcontrast imaging. Additional T2/FLAIR hyperintense signal abnormality within the posterior limbs of the right internal capsules (greater on the right). Additional signal abnormality within the left-greater-than-right basal ganglia. No extra-axial fluid collection. No midline shift. Vascular: Maintained flow voids within the proximal large arterial  vessels. Skull and upper cervical spine: No  focal suspicious marrow lesion. Sinuses/Orbits: Visualized orbits show no acute finding. No significant paranasal sinus disease. IMPRESSION: Motion degraded examination, as described in limiting evaluation. Patchy cortical/subcortical T2/FLAIR hyperintense signal abnormality within the left-greater-than-right posterior frontal lobes and bilateral parieto-occipital lobes. Additional T2/FLAIR hyperintense signal abnormality within the posterior limbs of the internal capsules and left-greater-than-right basal ganglia. Probable subtle patchy superimposed enhancement within the bilateral parieto-occipital lobes. 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 However, alternative etiologies cannot be excluded (such as acute encephalitis). Clinical correlation is recommended. Additionally, MRI follow-up is recommended to assess for stability, progression or resolution of these findings. Subtle diffusion-weighted signal abnormality within the bilateral parieto-occipital lobes, which may reflect complicating ischemia. Electronically Signed   By: Kellie Simmering D.O.   On: 12/04/2020 12:59   EEG adult  Result Date: 12/04/2020 Greta Doom, MD     12/04/2020  2:47 PM History: 35 yo F being evaluated for seizure Sedation: Versed Technique: This EEG was acquired with electrodes placed according to the International 10-20 electrode system (including Fp1, Fp2, F3, F4, C3, C4, P3, P4, O1, O2, T3, T4, T5, T6, A1, A2, Fz, Cz, Pz). The following electrodes were missing or displaced: none.  There is some increase in impedances resulting in some electrode artifact, but I feel that the recording is interpretable. Background: Much of this recording occurs during drowsiness and early sleep, but during the maximal waking state the background consists of intermixed alpha and beta activities. There is a well defined posterior dominant rhythm of 10 hz that attenuates  with eye opening.  There is an increase in slow activity associated with drowsiness.  Sleep is recorded with normal appearing structures. Photic stimulation: Physiologic driving is not performed EEG Abnormalities: None Clinical Interpretation: This normal EEG is recorded in the waking and sleep state. There was no seizure or seizure predisposition recorded on this study. Please note that lack of epileptiform activity on EEG does not preclude the possibility of epilepsy. Roland Rack, MD [facility] [phone] If 7pm- 7am, please page neurology on call as listed in [place].    Assessment/Plan: S/p Eclampsia: Patient is s/p magnesium therapy and blood pressures are mild range on  Labetalol.  Will continue labetalol 100 mg bid at home Patient given strict preeclampsia precautions and signs to watch out for at home.  Discussed BP control SBP<160 and DBP <110 and to check her BPs at home.  Will continue Keppra per Neurology recommendation 500 mg BID for at least 3 months.  Patient counseled not to drive, swim or do any activities that might be dangerous if she had a seizure while doing them.  Patient to follow up in OB/GYN office this week and next week and with Neurology in 3-4 weeks.  She will have another MRI brain in 3 months to evaluate for resolution of PRES.   Rogelio Seen [place] 12/06/2020

## 2020-12-06 NOTE — Progress Notes (Signed)
Discharge instructions and prescriptions given to pt. Discussed signs and symptoms to report to the MD, upcoming appointments, and meds. Pt verbalizes understanding and has no questions or concerns at this time. Pt discharged home from hospital in stable condition. 

## 2020-12-07 ENCOUNTER — Telehealth (HOSPITAL_COMMUNITY): Payer: Self-pay | Admitting: *Deleted

## 2020-12-07 NOTE — Telephone Encounter (Signed)
Hospital Discharge Follow-Up Call:  Patient reports that she is doing well overall.  She had a headache that was relieved by Tylenol.  She reports having had high blood pressure during pregnancy.  Unsure when her follow-up appointment is with her provider.  She denies blurred vision, seeing spots, RUQ pain, or swelling.  Encouraged her to take her blood pressure at home (says she has a cuff), and to call her provider tomorrow for a follow-up appt.  EPDS today was 4 and she feels this accurately reflects that she is doing well emotionally.  Patient reports that her baby is well.  They have an appointment at the pediatrician tomorrow.  She says that the baby sleeps in a bassinet.  Reviewed the ABCs of Safe Sleep.

## 2020-12-24 ENCOUNTER — Inpatient Hospital Stay (HOSPITAL_COMMUNITY)
Admission: AD | Admit: 2020-12-24 | Discharge: 2020-12-24 | Disposition: A | Discharge status: Home / Self Care | Payer: Medicaid Other | Attending: Obstetrics & Gynecology | Admitting: Obstetrics & Gynecology

## 2020-12-24 ENCOUNTER — Encounter (HOSPITAL_COMMUNITY): Payer: Self-pay | Admitting: Obstetrics & Gynecology

## 2020-12-24 ENCOUNTER — Other Ambulatory Visit: Payer: Self-pay

## 2020-12-24 DIAGNOSIS — R197 Diarrhea, unspecified: Secondary | ICD-10-CM

## 2020-12-24 DIAGNOSIS — Z8759 Personal history of other complications of pregnancy, childbirth and the puerperium: Secondary | ICD-10-CM | POA: Insufficient documentation

## 2020-12-24 DIAGNOSIS — R5383 Other fatigue: Secondary | ICD-10-CM | POA: Diagnosis not present

## 2020-12-24 DIAGNOSIS — Z79899 Other long term (current) drug therapy: Secondary | ICD-10-CM | POA: Insufficient documentation

## 2020-12-24 DIAGNOSIS — O99893 Other specified diseases and conditions complicating puerperium: Secondary | ICD-10-CM | POA: Diagnosis not present

## 2020-12-24 DIAGNOSIS — K5190 Ulcerative colitis, unspecified, without complications: Secondary | ICD-10-CM | POA: Insufficient documentation

## 2020-12-24 DIAGNOSIS — Z791 Long term (current) use of non-steroidal anti-inflammatories (NSAID): Secondary | ICD-10-CM | POA: Diagnosis not present

## 2020-12-24 HISTORY — DX: Noninfective gastroenteritis and colitis, unspecified: K52.9

## 2020-12-24 LAB — URINALYSIS, ROUTINE W REFLEX MICROSCOPIC
Bacteria, UA: NONE SEEN
Bilirubin Urine: NEGATIVE
Glucose, UA: NEGATIVE mg/dL
Hgb urine dipstick: NEGATIVE
Ketones, ur: NEGATIVE mg/dL
Leukocytes,Ua: NEGATIVE
Nitrite: NEGATIVE
Protein, ur: 30 mg/dL — AB
Specific Gravity, Urine: 1.015 (ref 1.005–1.030)
pH: 6 (ref 5.0–8.0)

## 2020-12-24 LAB — CBC
HCT: 49 % — ABNORMAL HIGH (ref 36.0–46.0)
Hemoglobin: 16.1 g/dL — ABNORMAL HIGH (ref 12.0–15.0)
MCH: 29.5 pg (ref 26.0–34.0)
MCHC: 32.9 g/dL (ref 30.0–36.0)
MCV: 89.7 fL (ref 80.0–100.0)
Platelets: 474 10*3/uL — ABNORMAL HIGH (ref 150–400)
RBC: 5.46 MIL/uL — ABNORMAL HIGH (ref 3.87–5.11)
RDW: 14.4 % (ref 11.5–15.5)
WBC: 8.3 10*3/uL (ref 4.0–10.5)
nRBC: 0 % (ref 0.0–0.2)

## 2020-12-24 LAB — GLUCOSE, CAPILLARY: Glucose-Capillary: 119 mg/dL — ABNORMAL HIGH (ref 70–99)

## 2020-12-24 NOTE — MAU Note (Signed)
Presents c/o fatigue, weakness, and diarrhea.  Reports she's had diarrhea since new medication started approx 1 weak ago.  Reports has felt tired and fatigue since onset of medication.   S/P NVD 11/24/2020.  Reports has been hospitalized post delivery for eclampsia (approx 1 week post delivery.)

## 2020-12-24 NOTE — MAU Note (Signed)
Received 35 y/o, s/p nsvd PPD 30, pt c/o constant diarrhea x 3 weeks, pt reported feeling fatigued. Denies any headache, blurry vision dizziness or right upper quadrant pain, denies pain or discomfort, safety maintained.

## 2020-12-24 NOTE — MAU Provider Note (Signed)
Chief Complaint:  Fatigue and Diarrhea   Event Date/Time   First Provider Initiated Contact with Patient 12/24/20 1806     HPI: Monica Green is a 35 y.o. G1P1001 s/p SVD on 11/24/2020 who presents to maternity admissions reporting weakness, fatigue, and diarrhea. Patient reports that she has felt this way since she received magnesium in the hospital. She has a history of ulcerative colitis, started taking Imodium 2 days ago, however did not take last night. She did have improvement in symptoms the day that she did take it. Has no GI MD, however her OBGYN has made a referral. She thinks symptoms are related to low iron as she has a history of anemia in the past. She restarted taking iron yesterday. Patient reports that she feels as if she isn't being the mother that she should be because she is so tired. Patient's mother is present at the bedside as well and is contributing to patient history. Patient's mother reports that patient  has not been eating as much because she has a low appetite. Patient's mother also reports that patient has been "trying to do too much" despite having a very supportive family and significant other. Patient does agree with mother in that she is trying to do too much and is not resting as much as she should because she feels like she needs to be doing more.  Once patient's mother left the room, patient denies any SI/HI or symptoms of depression.   Pregnancy Course:   Past Medical History:  Diagnosis Date   Anemia    Colitis    Pre-eclampsia    Ulcerative colitis ([place])    OB History  Gravida Para Term Preterm AB Living  1 1 1     1   SAB IAB Ectopic Multiple Live Births        0 1    # Outcome Date GA Lbr Len/2nd Weight Sex Delivery Anes PTL Lv  1 Term 11/24/20 [redacted]w[redacted]d 02:38 / 00:37 2490 g F Vag-Spont EPI  LIV     Birth Comments: WDL   Past Surgical History:  Procedure Laterality Date   FLEXIBLE SIGMOIDOSCOPY N/A 03/23/2015   Procedure: FLEXIBLE  SIGMOIDOSCOPY;  Surgeon: Laurence Spates, MD;  Location: [facility] ENDOSCOPY;  Service: Endoscopy;  Laterality: N/A;   Family History  Problem Relation Age of Onset   Colon cancer Father    Social History   Tobacco Use   Smoking status: Never   Smokeless tobacco: Never  Vaping Use   Vaping Use: Never used  Substance Use Topics   Alcohol use: Not Currently    Comment: Social   Drug use: No   No Known Allergies Medications Prior to Admission  Medication Sig Dispense Refill Last Dose   acetaminophen (TYLENOL) 500 MG tablet Take 1,000 mg by mouth every 6 (six) hours as needed for moderate pain or headache.   12/24/2020 at 1030   ferrous sulfate 325 (65 FE) MG tablet Take 325 mg by mouth daily with breakfast.   12/24/2020 at 0900   labetalol (NORMODYNE) 100 MG tablet Take 1 tablet (100 mg total) by mouth 2 (two) times daily. 60 tablet 3 12/24/2020 at 0930   levETIRAcetam (KEPPRA) 500 MG tablet Take 1 tablet (500 mg total) by mouth 2 (two) times daily. 60 tablet 3 12/24/2020   Prenatal Vit-Fe Fumarate-FA (PRENATAL MULTIVITAMIN) TABS tablet Take 1 tablet by mouth daily at 12 noon. 60 tablet 3 12/24/2020 at 0930   ibuprofen (ADVIL) 600 MG tablet Take  1 tablet (600 mg total) by mouth every 6 (six) hours. (Patient taking differently: Take 600 mg by mouth every 6 (six) hours as needed for mild pain.) 30 tablet 0    I have reviewed patient's Past Medical Hx, Surgical Hx, Family Hx, Social Hx, medications and allergies.   ROS:  Review of Systems  Constitutional:  Positive for fatigue.  Respiratory: Negative.    Cardiovascular: Negative.   Gastrointestinal:  Positive for diarrhea. Negative for abdominal pain, constipation, nausea and vomiting.  Genitourinary: Negative.   Musculoskeletal: Negative.   Neurological: Negative.   Psychiatric/Behavioral: Negative.     Physical Exam  Patient Vitals for the past 24 hrs:  BP Temp Temp src Pulse Resp SpO2 Height Weight  12/24/20 1917 119/84 -- -- (!) 105 -- --  -- --  12/24/20 1730 115/85 97.9 F (36.6 C) Oral (!) 107 18 100 % -- --  12/24/20 1626 108/78 97.9 F (36.6 C) Oral (!) 118 18 97 % -- --  12/24/20 1617 -- -- -- -- -- -- 5\' 5"  (1.651 m) 55.9 kg   Constitutional: well-developed, well-nourished female in no acute distress.  Cardiovascular: normal rate Respiratory: normal effort GI: abd soft, non-tender MS: extremities nontender, no edema, normal ROM Neurologic: alert and oriented x 4.  Psychiatric: mood normal, behavior normal, judgment normal    Labs: Results for orders placed or performed during the hospital encounter of 12/24/20 (from the past 24 hour(s))  Urinalysis, Routine w reflex microscopic Urine, Clean Catch     Status: Abnormal   Collection Time: 12/24/20  5:09 PM  Result Value Ref Range   Color, Urine AMBER (A) YELLOW   APPearance HAZY (A) CLEAR   Specific Gravity, Urine 1.015 1.005 - 1.030   pH 6.0 5.0 - 8.0   Glucose, UA NEGATIVE NEGATIVE mg/dL   Hgb urine dipstick NEGATIVE NEGATIVE   Bilirubin Urine NEGATIVE NEGATIVE   Ketones, ur NEGATIVE NEGATIVE mg/dL   Protein, ur 30 (A) NEGATIVE mg/dL   Nitrite NEGATIVE NEGATIVE   Leukocytes,Ua NEGATIVE NEGATIVE   RBC / HPF 0-5 0 - 5 RBC/hpf   WBC, UA 11-20 0 - 5 WBC/hpf   Bacteria, UA NONE SEEN NONE SEEN   Squamous Epithelial / LPF 0-5 0 - 5   Mucus PRESENT    Hyaline Casts, UA PRESENT   Glucose, capillary     Status: Abnormal   Collection Time: 12/24/20  6:33 PM  Result Value Ref Range   Glucose-Capillary 119 (H) 70 - 99 mg/dL  CBC     Status: Abnormal   Collection Time: 12/24/20  6:34 PM  Result Value Ref Range   WBC 8.3 4.0 - 10.5 K/uL   RBC 5.46 (H) 3.87 - 5.11 MIL/uL   Hemoglobin 16.1 (H) 12.0 - 15.0 g/dL   HCT 49.0 (H) 36.0 - 46.0 %   MCV 89.7 80.0 - 100.0 fL   MCH 29.5 26.0 - 34.0 pg   MCHC 32.9 30.0 - 36.0 g/dL   RDW 14.4 11.5 - 15.5 %   Platelets 474 (H) 150 - 400 K/uL   nRBC 0.0 0.0 - 0.2 %    Imaging:    MAU Course: Orders Placed This  Encounter  Procedures   Urinalysis, Routine w reflex microscopic Urine, Clean Catch   CBC   Glucose, capillary   Discharge patient   No orders of the defined types were placed in this encounter.   MDM: POCT CBG CBC  I discussed at length with patient that I  think diarrhea is likely flare up of UC given recent stressful events surrounding delivery and postpartum state. Fatigue likely related to a number of contributing factors including changes to family dynamic, lack of appetite, resting, trying to do too much, as well as traumatizing PP experience. Patient to continue Imodium as previously instructed. Stop taking iron supplement as Hgb 16.1. Patient encouraged to continue letting family be supportive and help out as much as possible. Recommend small, frequent, fulfilling meals/snacks with increased amounts of fats/proteins. Rest when baby is resting.    Assessment: 1. Fatigue, unspecified type   2. Diarrhea, unspecified type     Plan: Discharge home in stable condition Recommend that patient stop taking iron supplement as Hgb was 16.1 Continue taking Imodium as previously instructed Keep appointment at [facility] and Hematology as scheduled on Tuesday.  Return to MAU as needed.    Follow-up Information     Ob/Gyn, [place] Follow up.   Specialty: Obstetrics and Gynecology Why: as scheduled on Tuesday. Return to MAU as needed. Contact information: [address] [place] [address] [phone]                 Allergies as of 12/24/2020   No Known Allergies      Medication List     STOP taking these medications    ferrous sulfate 325 (65 FE) MG tablet       TAKE these medications    acetaminophen 500 MG tablet Commonly known as: TYLENOL Take 1,000 mg by mouth every 6 (six) hours as needed for moderate pain or headache.   ibuprofen 600 MG tablet Commonly known as: ADVIL Take 1 tablet (600 mg total) by mouth every 6 (six)  hours. What changed:  when to take this reasons to take this   labetalol 100 MG tablet Commonly known as: NORMODYNE Take 1 tablet (100 mg total) by mouth 2 (two) times daily.   levETIRAcetam 500 MG tablet Commonly known as: KEPPRA Take 1 tablet (500 mg total) by mouth 2 (two) times daily.   prenatal multivitamin Tabs tablet Take 1 tablet by mouth daily at 12 noon.          Maryagnes Amos, MSN, CNM 12/24/2020 7:36 PM

## 2020-12-27 ENCOUNTER — Encounter: Payer: Self-pay | Admitting: Internal Medicine

## 2020-12-28 ENCOUNTER — Inpatient Hospital Stay: Payer: Medicaid Other

## 2020-12-28 ENCOUNTER — Inpatient Hospital Stay: Payer: Medicaid Other | Admitting: Hematology

## 2021-01-12 ENCOUNTER — Encounter (HOSPITAL_COMMUNITY): Payer: Self-pay | Admitting: Radiology

## 2021-01-14 ENCOUNTER — Ambulatory Visit: Payer: Medicaid Other | Admitting: Neurology

## 2021-01-14 ENCOUNTER — Other Ambulatory Visit: Payer: Self-pay

## 2021-01-14 ENCOUNTER — Encounter: Payer: Self-pay | Admitting: Neurology

## 2021-01-14 VITALS — BP 88/58 | HR 67 | Ht 64.0 in | Wt 120.0 lb

## 2021-01-14 DIAGNOSIS — I6783 Posterior reversible encephalopathy syndrome: Secondary | ICD-10-CM | POA: Diagnosis not present

## 2021-01-14 DIAGNOSIS — R569 Unspecified convulsions: Secondary | ICD-10-CM | POA: Diagnosis not present

## 2021-01-14 NOTE — Patient Instructions (Signed)
Continue with Keppra 500 mg twice  a day Will obtain a repeat MRI Brain with and without contrast, and if abnormalities resolved, will discontinue the anti-seizure medications  Will contact patient after completion of MRI  Return as needed

## 2021-01-14 NOTE — Progress Notes (Signed)
[facility]  PATIENT: Monica Green DOB: 10-Oct-1985  REFERRING CLINICIAN: Crawford Givens, MD HISTORY FROM: Patient and mother  REASON FOR VISIT: New seizure   HISTORICAL  CHIEF COMPLAINT:  Chief Complaint  Patient presents with   New Patient (Initial Visit)    Pt with mom, rm 31. Recently had a baby girl and was dc home. She was there for at least a week and then boyfriend witnessed a seizure. Never had a seizure before and that is the only one she has had. This took place on 12/04/2020. Went to ER.     HISTORY OF PRESENT ILLNESS:  This is a 35 year old woman with past medical history of of ulcerative colitis, preeclampsia who is presenting with seizure.  Patient said that she gave birth on August 24 to a healthy baby girl, at that time she was diagnosed with preeclampsia, she had to be induced.  After delivery she was started on a antihypertensive medication and discharged home.  She reported when she was at home she was not taking the blood pressure medication because it was giving her headache.  On September 3 she recalled her leg was hurting and she got up to go to the bathroom and next thing that she remembers was waking up in the hospital days later.  She was told that boyfriend found her in the bathroom floor and EMS was called.  In the hospital she had a brain MRI consistent with PRES, she also had a EEG which was negative for any acute seizures.  Patient returned to baseline after few days and was discharged home with a new blood pressure medication.  She reported that her blood pressure is now stabilized and she discontinued the medication.  She denies any additional seizure-like activity.  She does not have any recollection of her hospital stay until other than the last days.  Currently denies any headaches, denies any additional symptoms.  She is taking Keppra 500 mg twice daily and denies any side effect from the medication.     Handedness: right    Seizure Type: possible generalized, provoked seizures  Current frequency: Only one time   Any injuries from seizures: None   Seizure risk factors: Pre-eclampsia   Previous ASMs: Levetiracetam  Currenty ASMs: Levetiracetam  ASMs side effects: None  Brain Images: T2 flair hyperintense signal abnormality within the left the posterior limb of the internal capsule and left greater than right basal ganglia.  Findings consistent with press  Previous EEGs: No seizures normal study   OTHER MEDICAL CONDITIONS: Ulcerative colitis, preeclampsia  REVIEW OF SYSTEMS: Full 14 system review of systems performed and negative with exception of: As noted in the HPI  ALLERGIES: No Known Allergies  HOME MEDICATIONS: Outpatient Medications Prior to Visit  Medication Sig Dispense Refill   levETIRAcetam (KEPPRA) 500 MG tablet Take 1 tablet (500 mg total) by mouth 2 (two) times daily. 60 tablet 3   Prenatal Vit-Fe Fumarate-FA (PRENATAL MULTIVITAMIN) TABS tablet Take 1 tablet by mouth daily at 12 noon. 60 tablet 3   oxyCODONE-acetaminophen (PERCOCET/ROXICET) 5-325 MG tablet Take 1 tablet by mouth every 6 (six) hours as needed.     acetaminophen (TYLENOL) 500 MG tablet Take 1,000 mg by mouth every 6 (six) hours as needed for moderate pain or headache.     ibuprofen (ADVIL) 600 MG tablet Take 1 tablet (600 mg total) by mouth every 6 (six) hours. (Patient taking differently: Take 600 mg by mouth every 6 (six) hours as needed  for mild pain.) 30 tablet 0   labetalol (NORMODYNE) 100 MG tablet Take 1 tablet (100 mg total) by mouth 2 (two) times daily. 60 tablet 3   No facility-administered medications prior to visit.    PAST MEDICAL HISTORY: Past Medical History:  Diagnosis Date   Anemia    Colitis    Pre-eclampsia    Ulcerative colitis ([place])     PAST SURGICAL HISTORY: Past Surgical History:  Procedure Laterality Date   FLEXIBLE SIGMOIDOSCOPY N/A 03/23/2015   Procedure: FLEXIBLE SIGMOIDOSCOPY;   Surgeon: Laurence Spates, MD;  Location: [facility] ENDOSCOPY;  Service: Endoscopy;  Laterality: N/A;    FAMILY HISTORY: Family History  Problem Relation Age of Onset   Colon cancer Father     SOCIAL HISTORY: Social History   Socioeconomic History   Marital status: Single    Spouse name: Not on file   Number of children: 0   Years of education: Not on file   Highest education level: Not on file  Occupational History   Not on file  Tobacco Use   Smoking status: Never   Smokeless tobacco: Never  Vaping Use   Vaping Use: Never used  Substance and Sexual Activity   Alcohol use: Not Currently    Comment: Social   Drug use: No   Sexual activity: Yes  Other Topics Concern   Not on file  Social History Narrative   Not on file   Social Determinants of Health   Financial Resource Strain: Not on file  Food Insecurity: Not on file  Transportation Needs: Not on file  Physical Activity: Not on file  Stress: Not on file  Social Connections: Not on file  Intimate Partner Violence: Not on file    PHYSICAL EXAM  GENERAL EXAM/CONSTITUTIONAL: Vitals:  Vitals:   01/14/21 1015  BP: (!) 88/58  Pulse: 67  Weight: 120 lb (54.4 kg)  Height: 5\' 4"  (1.626 m)   Body mass index is 20.6 kg/m. Wt Readings from Last 3 Encounters:  01/14/21 120 lb (54.4 kg)  12/24/20 123 lb 3.2 oz (55.9 kg)  11/23/20 155 lb 3.2 oz (70.4 kg)   Patient is in no distress; well developed, nourished and groomed; neck is supple  EYES: Pupils round and reactive to light, Visual fields full to confrontation, Extraocular movements intacts,  No results found.  MUSCULOSKELETAL: Gait, strength, tone, movements noted in Neurologic exam below  NEUROLOGIC: MENTAL STATUS:  No flowsheet data found. awake, alert, oriented to person, place and time recent and remote memory intact normal attention and concentration language fluent, comprehension intact, naming intact fund of knowledge appropriate  CRANIAL  NERVE: 2nd, 3rd, 4th, 6th - pupils equal and reactive to light, visual fields full to confrontation, extraocular muscles intact, no nystagmus 5th - facial sensation symmetric 7th - facial strength symmetric 8th - hearing intact 9th - palate elevates symmetrically, uvula midline 11th - shoulder shrug symmetric 12th - tongue protrusion midline  MOTOR:  normal bulk and tone, full strength in the BUE, BLE  SENSORY:  normal and symmetric to light touch, pinprick, temperature, vibration  COORDINATION:  finger-nose-finger, fine finger movements normal  REFLEXES:  deep tendon reflexes present and symmetric  GAIT/STATION:  normal   DIAGNOSTIC DATA (LABS, IMAGING, TESTING) - I reviewed patient records, labs, notes, testing and imaging myself where available.  Lab Results  Component Value Date   WBC 8.3 12/24/2020   HGB 16.1 (H) 12/24/2020   HCT 49.0 (H) 12/24/2020   MCV 89.7 12/24/2020  PLT 474 (H) 12/24/2020      Component Value Date/Time   NA 140 12/04/2020 0633   K 3.1 (L) 12/04/2020 0633   CL 111 12/04/2020 0633   CO2 14 (L) 12/04/2020 0633   GLUCOSE 83 12/04/2020 0633   BUN 5 (L) 12/04/2020 0633   CREATININE 0.74 12/04/2020 0633   CREATININE 0.60 11/04/2020 1410   CREATININE 0.43 (L) 04/07/2015 1538   CALCIUM 8.7 (L) 12/04/2020 0633   PROT 7.2 12/04/2020 0633   ALBUMIN 3.0 (L) 12/04/2020 0633   AST 26 12/04/2020 0633   AST 35 11/04/2020 1410   ALT 19 12/04/2020 0633   ALT 24 11/04/2020 1410   ALKPHOS 83 12/04/2020 0633   BILITOT 0.6 12/04/2020 0633   BILITOT 0.6 11/04/2020 1410   GFRNONAA >60 12/04/2020 0633   GFRNONAA >60 11/04/2020 1410   GFRAA >60 03/27/2015 0557   No results found for: CHOL, HDL, LDLCALC, LDLDIRECT, TRIG No results found for: HGBA1C Lab Results  Component Value Date   VITAMINB12 139 (L) 11/04/2020   Lab Results  Component Value Date   TSH 0.827 03/22/2015    MRI Brain 12/04/2020. Patchy cortical/subcortical T2/FLAIR  hyperintense signal abnormality within the left-greater-than-right posterior frontal lobes and bilateral parieto-occipital lobes. Additional T2/FLAIR hyperintense signal abnormality within the posterior limbs of the internal capsules and left-greater-than-right basal ganglia. Probable subtle patchy superimposed enhancement within the bilateral parieto-occipital lobes. 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 However, alternative etiologies cannot be excluded (such as acute encephalitis). Clinical correlation is recommended. Additionally, MRI follow-up is recommended to assess for stability, progression or resolution of these findings.   Subtle diffusion-weighted signal abnormality within the bilateral parieto-occipital lobes, which may reflect complicating ischemia.   EEG 12/04/2020 This normal EEG is recorded in the waking and sleep state. There was no seizure or seizure predisposition recorded on this study. Please note that lack of epileptiform activity on EEG does not preclude the possibility of epilepsy  I personally reviewed brain Images and previous EEG reports.   ASSESSMENT AND PLAN  35 y.o. 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  Since being discharged from the hospital her blood pressure has normalized, she is not taking any antihypertensive medication.  Denies any headaches, denies any seizure-like activity and currently no complaints.  I explained to the patient that she likely has a provoked seizure in the setting of PRES due to eclampsia/preeclampsia.  At this time I will repeat the brain MRI and if the abnormalities resolve, I will discontinue her levetiracetam.  She is agreeable with plans, I will contact the patient after completion of the MRI.   1. Seizures ([place])   2. PRES (posterior  reversible encephalopathy syndrome)     PLAN: Continue with Keppra 500 mg twice  a day Will obtain a repeat MRI Brain with and without contrast, and if abnormalities resolved, will discontinue the anti-seizure medication  Will contact patient after completion of MRI  Return as needed    Per [facility] statutes, patients with seizures are not allowed to drive until they have been seizure-free for six months.  Other recommendations include using caution when using heavy equipment or power tools. Avoid working on ladders or at heights. Take showers instead of baths.  Do not swim alone.  Ensure the water temperature is not too high on the home water heater.  Do not go swimming alone. Do not lock yourself in a room alone (i.e. bathroom). When caring for infants or small children, sit down when holding, feeding, or changing them to minimize risk of injury to the child in the event you have a seizure. Maintain good sleep hygiene. Avoid alcohol.  Also recommend adequate sleep, hydration, good diet and minimize stress.   During the Seizure  - First, ensure adequate ventilation and place patients on the floor on their left side  Loosen clothing around the neck and ensure the airway is patent. If the patient is clenching the teeth, do not force the mouth open with any object as this can cause severe damage - Remove all items from the surrounding that can be hazardous. The patient may be oblivious to what's happening and may not even know what he or she is doing. If the patient is confused and wandering, either gently guide him/her away and block access to outside areas - Reassure the individual and be comforting - Call 911. In most cases, the seizure ends before EMS arrives. However, there are cases when seizures may last over 3 to 5 minutes. Or the individual may have developed breathing difficulties or severe injuries. If a pregnant patient or a person with diabetes develops a seizure, it is  prudent to call an ambulance. - Finally, if the patient does not regain full consciousness, then call EMS. Most patients will remain confused for about 45 to 90 minutes after a seizure, so you must use judgment in calling for help. - Avoid restraints but make sure the patient is in a bed with padded side rails - Place the individual in a lateral position with the neck slightly flexed; this will help the saliva drain from the mouth and prevent the tongue from falling backward - Remove all nearby furniture and other hazards from the area - Provide verbal assurance as the individual is regaining consciousness - Provide the patient with privacy if possible - Call for help and start treatment as ordered by the caregiver   After the Seizure (Postictal Stage)  After a seizure, most patients experience confusion, fatigue, muscle pain and/or a headache. Thus, one should permit the individual to sleep. For the next few days, reassurance is essential. Being calm and helping reorient the person is also of importance.  Most seizures are painless and end spontaneously. Seizures are not harmful to others but can lead to complications such as stress on the lungs, brain and the heart. Individuals with prior lung problems may develop labored breathing and respiratory distress.     Orders Placed This Encounter  Procedures   MR BRAIN W WO CONTRAST     No orders of the defined types were placed in this encounter.   Return if symptoms worsen or fail to improve.    Alric Ran, MD 01/14/2021, 12:01 PM  [facility] [address] [place], [place] [address] [phone]

## 2021-01-19 ENCOUNTER — Telehealth: Payer: Self-pay | Admitting: Neurology

## 2021-01-19 NOTE — Telephone Encounter (Signed)
Case started with Medicaid [facility] for MRI brain w/wo contrast. Pending tracking #[id number]. Phone # [phone]. Notes faxed to [phone].

## 2021-01-21 ENCOUNTER — Encounter: Payer: Self-pay | Admitting: Internal Medicine

## 2021-01-21 ENCOUNTER — Other Ambulatory Visit (INDEPENDENT_AMBULATORY_CARE_PROVIDER_SITE_OTHER): Payer: Medicaid Other

## 2021-01-21 ENCOUNTER — Ambulatory Visit (INDEPENDENT_AMBULATORY_CARE_PROVIDER_SITE_OTHER): Payer: Medicaid Other | Admitting: Internal Medicine

## 2021-01-21 VITALS — BP 100/62 | HR 115 | Ht 64.0 in | Wt 122.0 lb

## 2021-01-21 DIAGNOSIS — K51019 Ulcerative (chronic) pancolitis with unspecified complications: Secondary | ICD-10-CM

## 2021-01-21 LAB — C-REACTIVE PROTEIN: CRP: 16.4 mg/dL (ref 0.5–20.0)

## 2021-01-21 MED ORDER — TRAMADOL HCL 50 MG PO TABS: 50.0000 mg | ORAL_TABLET | Freq: Four times a day (QID) | ORAL | 0 refills | Status: DC | PRN

## 2021-01-21 NOTE — Patient Instructions (Addendum)
Your provider has requested that you go to the basement level for lab work before leaving today. Press "B" on the elevator. The lab is located at the first door on the left as you exit the elevator.  Due to recent changes in healthcare laws, you may see the results of your imaging and laboratory studies on MyChart before your provider has had a chance to review them.  We understand that in some cases there may be results that are confusing or concerning to you. Not all laboratory results come back in the same time frame and the provider may be waiting for multiple results in order to interpret others.  Please give [place] 48 hours in order for your provider to thoroughly review all the results before contacting the office for clarification of your results.   We have sent the following medications to your pharmacy for you to pick up at your convenience: Tramadol  I appreciate the opportunity to care for you. Georgian Co, MD

## 2021-01-21 NOTE — Progress Notes (Signed)
Chief Complaint: UC, diarrhea  HPI : 35 year old female with history of pan-UC and anemia presents with UC and diarrhea  She recently gave birth to a baby girl in August 2022 and also had a seizure that was attributed to eclampsia and/or PRES. She is no longer breast-feeding.  About 3 weeks ago she started having issues with diarrhea and poor appetite.  She has been on average having 6-8 bowel movements per day.  Endorses nocturnal stools that wake her up from sleep.  Whenever she eats, she will have a bowel movement.  After her diarrhea started, she has been having issues with rectal pain.  Her OB prescribed her a 5-day course of oxycodone to help with her symptoms.  Denies rectal bleeding or melena.  This feels similar to a UC flare that she has had in the past.  Her last flare was in 2016.  She has not been on any treatments consistently for her ulcerative colitis since then.  Denies abdominal pain, nausea, vomiting.  She has lost some weight due to her poor appetite. Denies alcohol use, illicit drug use.   She was first diagnosed with UC in 03/2015 when she was hospitalized with a UC flare and treated with steroids.  At that time she had a CT A/P that showed pancolitis.  She had a flexible sigmoidoscopy done that showed chronic active colitis.  She works as an Optician, dispensing.  Wt Readings from Last 3 Encounters:  01/21/21 122 lb (55.3 kg)  01/14/21 120 lb (54.4 kg)  12/24/20 123 lb 3.2 oz (55.9 kg)    Past Medical History:  Diagnosis Date   Anemia    Colitis    Pre-eclampsia    Ulcerative colitis ([place])     Past Surgical History:  Procedure Laterality Date   FLEXIBLE SIGMOIDOSCOPY N/A 03/23/2015   Procedure: FLEXIBLE SIGMOIDOSCOPY;  Surgeon: Laurence Spates, MD;  Location: [place];  Service: Endoscopy;  Laterality: N/A;   Family History  Problem Relation Age of Onset   Colon cancer Father    Social History   Tobacco Use   Smoking status: Never    Smokeless tobacco: Never  Vaping Use   Vaping Use: Never used  Substance Use Topics   Alcohol use: Not Currently    Comment: Social   Drug use: No   Current Outpatient Medications  Medication Sig Dispense Refill   levETIRAcetam (KEPPRA) 500 MG tablet Take 500 mg by mouth 2 (two) times daily.     Prenatal Vit-Fe Fumarate-FA (PRENATAL MULTIVITAMIN) TABS tablet Take 1 tablet by mouth daily at 12 noon. 60 tablet 3   traMADol (ULTRAM) 50 MG tablet Take 1 tablet (50 mg total) by mouth every 6 (six) hours as needed. 20 tablet 0   No current facility-administered medications for this visit.   No Known Allergies   Review of Systems: All systems reviewed and negative except where noted in HPI.   Physical Exam: BP 100/62   Pulse (!) 115   Ht 5\' 4"  (1.626 m)   Wt 122 lb (55.3 kg)   BMI 20.94 kg/m  Constitutional: Pleasant,well-developed, female in no acute distress. HEENT: Normocephalic and atraumatic. Conjunctivae are normal. No scleral icterus. Cardiovascular: Normal rate, regular rhythm.  Pulmonary/chest: Effort normal and breath sounds normal. No wheezing, rales or rhonchi. Abdominal: Soft, nondistended, nontender. Bowel sounds active throughout. There are no masses palpable. No hepatomegaly. Extremities: No edema Neurological: Alert and oriented to person place and time. Skin: Skin is warm  and dry. No rashes noted. Psychiatric: Normal mood and affect. Behavior is normal.  Labs 12/2020: CBC unremarkable except for elevated platelet count of 474, CMP with hypokalemia  CT A/P w/contrast 03/21/15: IMPRESSION: 1. Pancolitis. Given the findings on the prior study and associated prominent lymph nodes in the mesentery and perirectal space, ulcerative colitis is favored. Pseudomembranous colitis considered less likely. 2. No evidence of bowel obstruction, perforation, abscess or appendicitis. 3. Probable focal fat in the left hepatic lobe.  Flexible sigmoidoscopy  03/23/15: ENDOSCOPIC IMPRESSION: 1. Severe Pancolitis. One G.I. pathogen panel is negative for enteric pathogens and will be repeated. The most likely etiology is Crohn's or ulcerative colitis. Path: Colon, biopsy, random - CHRONIC MILDLY ACTIVE COLITIS. - SEE MICROSCOPIC DESCRIPTION Microscopic Comment The lamina propria is markedly expanded by increased inflammatory infiltrate with focal reactive lymphoid aggregates. There is moderate to focally marked crypt distortion and there is focal ulceration. The findings are consistent with chronic minimally to mildly active inflammatory bowel disease. No granulomas or dysplasia is identified.  ASSESSMENT AND PLAN: Ulcerative colitis Diarrhea Rectal pain Patient presents with diarrhea after recently having a child 2 months ago, which feels similar to a UC flare she has had in the past.  She is not on any therapies for her UC.  At this time I would favor ruling out infectious etiologies and then proceeding with prednisone therapy if the work-up is negative. Will also check her inflammatory markers and plan for potential biologic therapy in the future.  If her labs are suggestive of an UC flare, then I would favor starting her on biologic therapy. - Check C dif PCR, GI pathogen panel - Check CRP, Hep B surface antigen, Hep B core antibody, Hep B surface antibody, quantiferon gold, and TPMT, fecal calprotectin - Tramadol PRN due to rectal discomfort - RTC in 2 weeks  Christia Reading, MD  I spent 65 minutes of time, including in depth chart review, independent review of results as outlined above, communicating results with the patient directly, face-to-face time with the patient, coordinating care, ordering studies and medications as appropriate, and documentation.

## 2021-01-25 ENCOUNTER — Other Ambulatory Visit: Payer: Medicaid Other

## 2021-01-25 DIAGNOSIS — K51919 Ulcerative colitis, unspecified with unspecified complications: Secondary | ICD-10-CM

## 2021-01-25 DIAGNOSIS — K51019 Ulcerative (chronic) pancolitis with unspecified complications: Secondary | ICD-10-CM

## 2021-01-27 LAB — QUANTIFERON-TB GOLD PLUS
Mitogen-NIL: 0.28 IU/mL
NIL: 0.03 IU/mL
QuantiFERON-TB Gold Plus: UNDETERMINED — AB
TB1-NIL: <0 IU/mL
TB2-NIL: <0 IU/mL

## 2021-01-27 LAB — CLOSTRIDIUM DIFFICILE BY PCR: Toxigenic C. Difficile by PCR: NEGATIVE

## 2021-01-27 LAB — HEPATITIS B CORE ANTIBODY, TOTAL: Hep B Core Total Ab: NONREACTIVE

## 2021-01-27 LAB — HEPATITIS B SURFACE ANTIGEN: Hepatitis B Surface Ag: NONREACTIVE

## 2021-01-27 LAB — HEPATITIS B SURFACE ANTIBODY,QUALITATIVE: Hep B S Ab: REACTIVE — AB

## 2021-01-27 LAB — THIOPURINE METHYLTRANSFERASE (TPMT), RBC

## 2021-01-28 ENCOUNTER — Telehealth: Payer: Self-pay | Admitting: Internal Medicine

## 2021-01-28 ENCOUNTER — Other Ambulatory Visit: Payer: Self-pay

## 2021-01-28 DIAGNOSIS — K51019 Ulcerative (chronic) pancolitis with unspecified complications: Secondary | ICD-10-CM

## 2021-01-28 LAB — GI PROFILE, STOOL, PCR

## 2021-01-28 MED ORDER — PREDNISONE 5 MG PO TABS: ORAL_TABLET | ORAL | 0 refills | Status: DC

## 2021-01-28 MED ORDER — PREDNISONE 5 MG PO TABS: ORAL_TABLET | ORAL | 0 refills | Status: AC

## 2021-01-28 NOTE — Telephone Encounter (Signed)
Outpatient Medication Detail   Disp Refills Start End   predniSONE (DELTASONE) 5 MG tablet 252 tablet 0 01/28/2021 03/25/2021   Sig - Route: Take 8 tablets (40 mg total) by mouth daily with breakfast for 7 days, THEN 7 tablets (35 mg total) daily with breakfast for 7 days, THEN 6 tablets (30 mg total) daily with breakfast for 7 days, THEN 5 tablets (25 mg total) daily with breakfast for 7 days, THEN 4 tablets (20 mg total) daily with breakfast for 7 days, THEN 3 tablets (15 mg total) daily with breakfast for 7 days, THEN 2 tablets (10 mg total) daily with breakfast for 7 days, THEN 1 tablet (5 mg total) daily with breakfast for 7 days. - Oral   Sent to pharmacy as: predniSONE (DELTASONE) 5 MG tablet   Notes to Pharmacy: DISREGARD PREVIOUS RX. DEFAULT IMPROPERLY CALCULATED TOTAL # OF TABLETS   E-Prescribing Status: Receipt confirmed by pharmacy (01/28/2021 10:33 AM EDT)    Rx updated to reflect CORRECT # of tablets and sent successfully as above.

## 2021-01-28 NOTE — Telephone Encounter (Signed)
Maridi - Pharmacist at [facility] [phone]  Needs clarification on Prednisone instructions.  The directions do not match the quantity ordered.  Please call to clarify.

## 2021-01-28 NOTE — Addendum Note (Signed)
Addended by: Trenda Moots on: 06/12/8116 09:25 AM   Modules accepted: Orders

## 2021-01-28 NOTE — Progress Notes (Signed)
Hi Monica Green, Monica Green's infectious stool studies were negative. Please call her to let her know and that we will be starting her on steroids. Go ahead and order prednisone 40 mg QD x 7 days, decreasing by 5 mg every week.

## 2021-01-30 LAB — CALPROTECTIN, FECAL: Calprotectin, Fecal: 1480 ug/g — ABNORMAL HIGH (ref 0–120)

## 2021-02-01 ENCOUNTER — Ambulatory Visit
Admission: RE | Admit: 2021-02-01 | Discharge: 2021-02-01 | Disposition: A | Discharge status: Home / Self Care | Payer: Medicaid Other | Source: Ambulatory Visit | Attending: Neurology | Admitting: Neurology

## 2021-02-01 ENCOUNTER — Other Ambulatory Visit: Payer: Self-pay

## 2021-02-01 DIAGNOSIS — R569 Unspecified convulsions: Secondary | ICD-10-CM

## 2021-02-01 MED ORDER — GADOBENATE DIMEGLUMINE 529 MG/ML IV SOLN
11.0000 mL | Freq: Once | INTRAVENOUS | Status: AC | PRN
  Administered 2021-02-01: 11 mL via INTRAVENOUS

## 2021-02-06 ENCOUNTER — Other Ambulatory Visit: Payer: Self-pay

## 2021-02-06 DIAGNOSIS — K51019 Ulcerative (chronic) pancolitis with unspecified complications: Secondary | ICD-10-CM

## 2021-02-07 ENCOUNTER — Telehealth: Payer: Self-pay

## 2021-02-07 ENCOUNTER — Telehealth: Payer: Self-pay | Admitting: Neurology

## 2021-02-07 NOTE — Telephone Encounter (Signed)
Called patient to see if she could come at an earlier appointment time on Tuesday since Dr. Libby Maw schedule changed. She indicated that she would rather reschedule for a few weeks from now since she did not start on the prednisone right away and wants to give it a chance to work. Patient rescheduled for next Friday, 11-18.  She indicates that someone in the lab called her and said she needs to come back to the lab but was not sure why. Dr. Lorenso Courier - could you please confirm if patient still needs labs?  I see a GI pathogen panel AND a TPMT in her orders but it appears she already had the GI stool Profile and only needs the TPMT redone. Is that correct?  Thank you.

## 2021-02-07 NOTE — Telephone Encounter (Signed)
Spoke with patient, MRI is normal, resolution of PRES. Since she had the seizure on 9/3, she should not drive for a total of 6 months while being seizure free (Until March). All questions answered.   Alric Ran, MD

## 2021-02-07 NOTE — Telephone Encounter (Signed)
Pt states Dr April Manson left her a vm on Friday, she is asking for a call back.

## 2021-02-08 ENCOUNTER — Inpatient Hospital Stay: Payer: Medicaid Other

## 2021-02-08 ENCOUNTER — Other Ambulatory Visit: Payer: Self-pay

## 2021-02-08 ENCOUNTER — Inpatient Hospital Stay: Payer: Medicaid Other | Attending: Hematology and Oncology | Admitting: Hematology

## 2021-02-08 ENCOUNTER — Ambulatory Visit: Payer: Medicaid Other | Admitting: Internal Medicine

## 2021-02-08 VITALS — BP 122/68 | HR 54 | Temp 97.7°F | Resp 20 | Wt 127.5 lb

## 2021-02-08 DIAGNOSIS — E538 Deficiency of other specified B group vitamins: Secondary | ICD-10-CM | POA: Diagnosis not present

## 2021-02-08 DIAGNOSIS — Z3A35 35 weeks gestation of pregnancy: Secondary | ICD-10-CM | POA: Diagnosis not present

## 2021-02-08 DIAGNOSIS — D509 Iron deficiency anemia, unspecified: Secondary | ICD-10-CM

## 2021-02-08 DIAGNOSIS — K5900 Constipation, unspecified: Secondary | ICD-10-CM | POA: Insufficient documentation

## 2021-02-08 DIAGNOSIS — E876 Hypokalemia: Secondary | ICD-10-CM | POA: Insufficient documentation

## 2021-02-08 DIAGNOSIS — O99013 Anemia complicating pregnancy, third trimester: Secondary | ICD-10-CM | POA: Insufficient documentation

## 2021-02-08 DIAGNOSIS — Z79899 Other long term (current) drug therapy: Secondary | ICD-10-CM | POA: Diagnosis not present

## 2021-02-08 DIAGNOSIS — O26893 Other specified pregnancy related conditions, third trimester: Secondary | ICD-10-CM | POA: Diagnosis not present

## 2021-02-08 DIAGNOSIS — O99283 Endocrine, nutritional and metabolic diseases complicating pregnancy, third trimester: Secondary | ICD-10-CM | POA: Diagnosis not present

## 2021-02-08 LAB — CBC WITH DIFFERENTIAL/PLATELET
Abs Immature Granulocytes: 0.01 10*3/uL (ref 0.00–0.07)
Basophils Absolute: 0.1 10*3/uL (ref 0.0–0.1)
Basophils Relative: 1 %
Eosinophils Absolute: 0.3 10*3/uL (ref 0.0–0.5)
Eosinophils Relative: 6 %
HCT: 33.4 % — ABNORMAL LOW (ref 36.0–46.0)
Hemoglobin: 10.8 g/dL — ABNORMAL LOW (ref 12.0–15.0)
Immature Granulocytes: 0 %
Lymphocytes Relative: 15 %
Lymphs Abs: 0.8 10*3/uL (ref 0.7–4.0)
MCH: 29.5 pg (ref 26.0–34.0)
MCHC: 32.3 g/dL (ref 30.0–36.0)
MCV: 91.3 fL (ref 80.0–100.0)
Monocytes Absolute: 0.5 10*3/uL (ref 0.1–1.0)
Monocytes Relative: 10 %
Neutro Abs: 3.6 10*3/uL (ref 1.7–7.7)
Neutrophils Relative %: 68 %
Platelets: 389 10*3/uL (ref 150–400)
RBC: 3.66 MIL/uL — ABNORMAL LOW (ref 3.87–5.11)
RDW: 16.7 % — ABNORMAL HIGH (ref 11.5–15.5)
WBC: 5.3 10*3/uL (ref 4.0–10.5)
nRBC: 0 % (ref 0.0–0.2)

## 2021-02-08 LAB — CMP (CANCER CENTER ONLY)
ALT: 10 U/L (ref 0–44)
AST: 11 U/L — ABNORMAL LOW (ref 15–41)
Albumin: 3.4 g/dL — ABNORMAL LOW (ref 3.5–5.0)
Alkaline Phosphatase: 52 U/L (ref 38–126)
Anion gap: 7 (ref 5–15)
BUN: 7 mg/dL (ref 6–20)
CO2: 22 mmol/L (ref 22–32)
Calcium: 8.6 mg/dL — ABNORMAL LOW (ref 8.9–10.3)
Chloride: 108 mmol/L (ref 98–111)
Creatinine: 0.7 mg/dL (ref 0.44–1.00)
GFR, Estimated: >60 mL/min (ref >60)
Glucose, Bld: 108 mg/dL — ABNORMAL HIGH (ref 70–99)
Potassium: 3.3 mmol/L — ABNORMAL LOW (ref 3.5–5.1)
Sodium: 137 mmol/L (ref 135–145)
Total Bilirubin: 0.3 mg/dL (ref 0.3–1.2)
Total Protein: 7.4 g/dL (ref 6.5–8.1)

## 2021-02-08 LAB — FERRITIN: Ferritin: 46 ng/mL (ref 11–307)

## 2021-02-08 LAB — IRON AND TIBC
Iron: 19 ug/dL — ABNORMAL LOW (ref 41–142)
Saturation Ratios: 6 % — ABNORMAL LOW (ref 21–57)
TIBC: 333 ug/dL (ref 236–444)
UIBC: 314 ug/dL (ref 120–384)

## 2021-02-08 LAB — VITAMIN B12: Vitamin B-12: 341 pg/mL (ref 180–914)

## 2021-02-09 ENCOUNTER — Other Ambulatory Visit: Payer: Medicaid Other

## 2021-02-11 ENCOUNTER — Encounter: Payer: Self-pay | Admitting: *Deleted

## 2021-02-16 ENCOUNTER — Telehealth: Payer: Self-pay | Admitting: Hematology

## 2021-02-16 NOTE — Telephone Encounter (Signed)
Unable to leave message with follow-up appointments per 11/8 los. Mailed calendar.

## 2021-02-17 NOTE — Progress Notes (Signed)
HEMATOLOGY/ONCOLOGY CLINIC NOTE  Date of Service: .02/08/2021   Patient Care Team: Bartholome Bill, MD as PCP - General (Family Medicine)  CHIEF COMPLAINTS/PURPOSE OF CONSULTATION:  Follow-up for iron deficiency anemia  HISTORY OF PRESENTING ILLNESS:   Monica Green is a wonderful 35 y.o. female who has been referred to [place] by Donnel Saxon, CNM at [facility] for evaluation and management of chronic severe anemia. The pt reports that she is doing well overall.  The pt reports that she is currently [redacted] weeks pregnant. She was seen previously by Dr. Lindi Adie in November 2021 in the hospital for severe anemia due to her ulcerative colitis. The pt notes that she had an allergic reaction to the third and last blood transfusion reactive due to eye itching and hives over her face. She experienced no issues tolerating the Feraheme she received in the past. The pt notes her ulcerative colitis has not been bothering her recently. The pt notes she sees a GI at [place]. The pt notes that she has needed IV iron in the past. The pt notes no treatments for her ulcerative colitis that was diagnosed in 2017 due to feelings of drowsiness ad heaviness in the past. The pt has been monitoring her food intake. The pt notes that she has not been taking iron supplements in her prenatal, but denies any Iron supplementation by itself due to constipation. The pt notes she was experiencing heavy periods for full seven days in the past prior to getting pregnant. The pt notes that she has not been to her GI since becoming pregnant. The pt experiences regular bowel habits with no noticeable blood or diarrhea. The pt notes that she was recently given an antibiotic for a UTI. 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 The pt notes she has recently been experiencing ice cravings within the last two weeks.  The pt notes no other medical issues outside of her ulcerative  colitis or familial blood disorders. The pt notes only social use of alcohol and denies any smoking prior to pregnancy. The pt notes her paternal grandmother and mother have diverticulitis but notes no major medical issues that run within the family.  Lab results 07/12/2020 of CBC w/diff and CMP is as follows: all values are WNL except for Hgb of 6.6, RBC of 3.47, HCT of 23.6, MCV of 69, MCH of 19.0, MCHC of 27.7, RDW of 18.1. 07/12/2020 Vitamin D 25-Hydroxy of 22.3.  On review of systems, pt reports lightheadedness, dizziness, ice cravings, and denies acute bloody/black stools, abdominal pain, and any other symptoms.  INTERVAL HISTORY:  Monica Green is a wonderful 35 y.o. female who is here for f/u of iron deficiency anemia in pregnancy. The patient's last visit with [place] was on 11/04/2020.  Patient notes that her baby is doing well and was delivered in August 2022.  She notes that she did have a seizure that was attributed to eclampsia or possible  PRES. She was evaluated by Dr. Dayna Barker on 01/21/2021 for her ulcerative colitis and was thought to have a flare for which she is on a prednisone taper which she notes is helping some. She has not been breast-feeding at this time. She notes that she does have some increased fatigue.  Labs done today show CBC with a hemoglobin of 10.8 WBC count of 5.3k platelets of 389k. CMP with mild hypokalemia at 3.3 but otherwise unremarkable. Ferritin 46 with an iron  saturation of 6% B12 was 139 previously and is now improved to 341 We discussed possible role of additional IV iron and patient is enthusiastic about proceeding with this since she felt much better after IV iron previously and has tolerated it well.  MEDICAL HISTORY:  Past Medical History:  Diagnosis Date   Anemia    Colitis    IDA (iron deficiency anemia)    Pre-eclampsia    Ulcerative colitis ([place])     SURGICAL HISTORY: Past Surgical History:  Procedure Laterality Date    FLEXIBLE SIGMOIDOSCOPY N/A 03/23/2015   Procedure: FLEXIBLE SIGMOIDOSCOPY;  Surgeon: Laurence Spates, MD;  Location: [facility] ENDOSCOPY;  Service: Endoscopy;  Laterality: N/A;    SOCIAL HISTORY: Social History   Socioeconomic History   Marital status: Single    Spouse name: Not on file   Number of children: 1   Years of education: Not on file   Highest education level: Not on file  Occupational History   Not on file  Tobacco Use   Smoking status: Never   Smokeless tobacco: Never  Vaping Use   Vaping Use: Never used  Substance and Sexual Activity   Alcohol use: Not Currently    Comment: Social   Drug use: No   Sexual activity: Yes  Other Topics Concern   Not on file  Social History Narrative   Not on file   Social Determinants of Health   Financial Resource Strain: Not on file  Food Insecurity: Not on file  Transportation Needs: Not on file  Physical Activity: Not on file  Stress: Not on file  Social Connections: Not on file  Intimate Partner Violence: Not on file    FAMILY HISTORY: Family History  Problem Relation Age of Onset   Colon cancer Father     ALLERGIES:  has No Known Allergies.  MEDICATIONS:  Current Outpatient Medications  Medication Sig Dispense Refill   predniSONE (DELTASONE) 5 MG tablet Take 8 tablets (40 mg total) by mouth daily with breakfast for 7 days, THEN 7 tablets (35 mg total) daily with breakfast for 7 days, THEN 6 tablets (30 mg total) daily with breakfast for 7 days, THEN 5 tablets (25 mg total) daily with breakfast for 7 days, THEN 4 tablets (20 mg total) daily with breakfast for 7 days, THEN 3 tablets (15 mg total) daily with breakfast for 7 days, THEN 2 tablets (10 mg total) daily with breakfast for 7 days, THEN 1 tablet (5 mg total) daily with breakfast for 7 days. 252 tablet 0   Prenatal Vit-Fe Fumarate-FA (PRENATAL MULTIVITAMIN) TABS tablet Take 1 tablet by mouth daily at 12 noon. 60 tablet 3   levETIRAcetam (KEPPRA) 500 MG tablet Take 500  mg by mouth 2 (two) times daily. (Patient not taking: Reported on 02/08/2021)     traMADol (ULTRAM) 50 MG tablet Take 1 tablet (50 mg total) by mouth every 6 (six) hours as needed. 20 tablet 0   No current facility-administered medications for this visit.    REVIEW OF SYSTEMS:   .10 Point review of Systems was done is negative except as noted above.  PHYSICAL EXAMINATION: ECOG PERFORMANCE STATUS: 2 - Symptomatic, <50% confined to bed  . Vitals:   02/08/21 1444  BP: 122/68  Pulse: (!) 54  Resp: 20  Temp: 97.7 F (36.5 C)  SpO2: 100%    Filed Weights   02/08/21 1444  Weight: 127 lb 8 oz (57.8 kg)    .Body mass index is 21.89 kg/m. Marland Kitchen  GENERAL:alert, in no acute distress and comfortable SKIN: no acute rashes, no significant lesions EYES: conjunctiva are pink and non-injected, sclera anicteric OROPHARYNX: MMM, no exudates, no oropharyngeal erythema or ulceration NECK: supple, no JVD LYMPH:  no palpable lymphadenopathy in the cervical, axillary or inguinal regions LUNGS: clear to auscultation b/l with normal respiratory effort HEART: regular rate & rhythm ABDOMEN:  normoactive bowel sounds , non tender, not distended. Extremity: no pedal edema PSYCH: alert & oriented x 3 with fluent speech NEURO: no focal motor/sensory deficits   LABORATORY DATA:  I have reviewed the data as listed  . CBC Latest Ref Rng & Units 02/08/2021 12/24/2020 12/04/2020  WBC 4.0 - 10.5 K/uL 5.3 8.3 10.4  Hemoglobin 12.0 - 15.0 g/dL 10.8(L) 16.1(H) 13.8  Hematocrit 36.0 - 46.0 % 33.4(L) 49.0(H) 44.0  Platelets 150 - 400 K/uL 389 474(H) 455(H)    . CMP Latest Ref Rng & Units 02/08/2021 12/04/2020 11/23/2020  Glucose 70 - 99 mg/dL 108(H) 83 63(L)  BUN 6 - 20 mg/dL 7 5(L) <5(L)  Creatinine 0.44 - 1.00 mg/dL 0.70 0.74 0.54  Sodium 135 - 145 mmol/L 137 140 137  Potassium 3.5 - 5.1 mmol/L 3.3(L) 3.1(L) 3.6  Chloride 98 - 111 mmol/L 108 111 104  CO2 22 - 32 mmol/L 22 14(L) 21(L)  Calcium 8.9 - 10.3  mg/dL 8.6(L) 8.7(L) 9.4  Total Protein 6.5 - 8.1 g/dL 7.4 7.2 6.7  Total Bilirubin 0.3 - 1.2 mg/dL 0.3 0.6 0.9  Alkaline Phos 38 - 126 U/L 52 83 117  AST 15 - 41 U/L 11(L) 26 35  ALT 0 - 44 U/L 10 19 23    . Lab Results  Component Value Date   IRON 19 (L) 02/08/2021   TIBC 333 02/08/2021   IRONPCTSAT 6 (L) 02/08/2021   (Iron and TIBC)  Lab Results  Component Value Date   FERRITIN 46 02/08/2021    B12 -- 139--->341   RADIOGRAPHIC STUDIES: I have personally reviewed the radiological images as listed and agreed with the findings in the report.  ASSESSMENT & PLAN:   35 year old female currently at about [redacted] weeks gestation with  #1  Iron deficiency anemia due to recent pregnancy and GI losses due to ulcerative colitis #2 vitamin B12 deficiency-likely due to poor absorption from ulcerative colitis and from increased demand during pregnancy.  #3  Hypokalemia potassium 3.3 due to diarrhea #4 history of ulcerative colitis currently having a flare and on prednisone taper PLAN: -Discussed pt labwork today, 02/08/2021 -CBC w/diff patient's hemoglobin is up to 12.7 from a previous level of 6 prior to transfusions and IV iron.  WBC counts and platelets within normal limits.  Labs done today show CBC with a hemoglobin of 10.8 WBC count of 5.3k platelets of 389k. CMP with mild hypokalemia at 3.3 but otherwise unremarkable. Ferritin 46 with an iron saturation of 6% B12 was 139 previously and is now improved to 341 We discussed possible role of additional IV iron and patient is enthusiastic about proceeding with this she felt much better after IV iron previously and has tolerated it well. -Will proceed with IV Venofer 3 mg weekly x3 doses for iron deficiency despite p.o.iron replacement. -Okay to hold p.o. iron for now since she is having ulcerative colitis flare and significant diarrhea. -continue iron-B12 2000 mcg sublingual daily to address B12 deficiency. -Continue prenatal  vitamins   FOLLOW UP: IV Venofer weekly x 3 doses  RTC with Dr Irene Limbo in 4 months with labs  All of the patients questions were answered with apparent satisfaction. The patient knows to call the clinic with any problems, questions or concerns.  . The total time spent in the appointment was 20 minutes and more than 50% was on counseling and direct patient cares.   Sullivan Lone MD [place] AAHIVMS [facility] Hematology/Oncology Physician [facility]

## 2021-02-18 ENCOUNTER — Other Ambulatory Visit: Payer: Medicaid Other

## 2021-02-18 ENCOUNTER — Ambulatory Visit (INDEPENDENT_AMBULATORY_CARE_PROVIDER_SITE_OTHER): Payer: Medicaid Other | Admitting: Internal Medicine

## 2021-02-18 VITALS — BP 108/60 | HR 62 | Ht 65.0 in | Wt 127.4 lb

## 2021-02-18 DIAGNOSIS — K51011 Ulcerative (chronic) pancolitis with rectal bleeding: Secondary | ICD-10-CM | POA: Diagnosis not present

## 2021-02-18 MED ORDER — MESALAMINE 1.2 G PO TBEC: 2.4000 g | DELAYED_RELEASE_TABLET | Freq: Every day | ORAL | 3 refills | Status: DC

## 2021-02-18 NOTE — Progress Notes (Signed)
Chief Complaint: UC, diarrhea  HPI : 35 year old female with history of pan-UC and anemia presents with UC and diarrhea  Interval History: Started her steroids about 2 weeks ago and has noted a dramatic improvement in her symptoms. Her diarrhea has resolved, and she denies abdominal pain. Denies GI bleeding. She is having on average 2 BMs per day. Currently prednisone 35 mg per day and will continue her taper. Her baby girl is doing well.  Current Outpatient Medications  Medication Sig Dispense Refill   predniSONE (DELTASONE) 5 MG tablet Take 8 tablets (40 mg total) by mouth daily with breakfast for 7 days, THEN 7 tablets (35 mg total) daily with breakfast for 7 days, THEN 6 tablets (30 mg total) daily with breakfast for 7 days, THEN 5 tablets (25 mg total) daily with breakfast for 7 days, THEN 4 tablets (20 mg total) daily with breakfast for 7 days, THEN 3 tablets (15 mg total) daily with breakfast for 7 days, THEN 2 tablets (10 mg total) daily with breakfast for 7 days, THEN 1 tablet (5 mg total) daily with breakfast for 7 days. 252 tablet 0   Prenatal Vit-Fe Fumarate-FA (PRENATAL MULTIVITAMIN) TABS tablet Take 1 tablet by mouth daily at 12 noon. 60 tablet 3   traMADol (ULTRAM) 50 MG tablet Take 1 tablet (50 mg total) by mouth every 6 (six) hours as needed. 20 tablet 0   No current facility-administered medications for this visit.   Review of Systems: All systems reviewed and negative except where noted in HPI.   Physical Exam: BP 108/60   Pulse 62   Ht 5\' 5"  (1.651 m)   Wt 127 lb 6.4 oz (57.8 kg)   BMI 21.20 kg/m  Constitutional: Pleasant,well-developed, female in no acute distress. HEENT: Normocephalic and atraumatic. Conjunctivae are normal. No scleral icterus. Cardiovascular: Normal rate, regular rhythm.  Pulmonary/chest: Effort normal and breath sounds normal. No wheezing, rales or rhonchi. Abdominal: Soft, nondistended, nontender. Bowel sounds active throughout. There are no  masses palpable. No hepatomegaly. Extremities: No edema Neurological: Alert and oriented to person place and time. Skin: Skin is warm and dry. No rashes noted. Psychiatric: Normal mood and affect. Behavior is normal.  Labs 12/2020: CBC unremarkable except for elevated platelet count of 474, CMP with hypokalemia  CT A/P w/contrast 03/21/15: IMPRESSION: 1. Pancolitis. Given the findings on the prior study and associated prominent lymph nodes in the mesentery and perirectal space, ulcerative colitis is favored. Pseudomembranous colitis considered less likely. 2. No evidence of bowel obstruction, perforation, abscess or appendicitis. 3. Probable focal fat in the left hepatic lobe.  Flexible sigmoidoscopy 03/23/15: ENDOSCOPIC IMPRESSION: 1. Severe Pancolitis. One G.I. pathogen panel is negative for enteric pathogens and will be repeated. The most likely etiology is Crohn's or ulcerative colitis. Path: Colon, biopsy, random - CHRONIC MILDLY ACTIVE COLITIS. - SEE MICROSCOPIC DESCRIPTION Microscopic Comment The lamina propria is markedly expanded by increased inflammatory infiltrate with focal reactive lymphoid aggregates. There is moderate to focally marked crypt distortion and there is focal ulceration. The findings are consistent with chronic minimally to mildly active inflammatory bowel disease. No granulomas or dysplasia is identified.  ASSESSMENT AND PLAN:  Ulcerative colitis Patient has had a dramatic improvement in her GI symptoms after being started on prednisone. Discussed therapy options with the patient including the risks and benefits of biologics, immunomodulators, and 5-ASAs, and she would like opt for mesalamine to start off with.  - Check quantiferon gold, TPMT enzyme - Start Lialda 2400  mg - RTC in 2 months  Christia Reading, MD

## 2021-02-18 NOTE — Patient Instructions (Addendum)
If you are age 36 or older, your body mass index should be between 23-30. Your Body mass index is 21.2 kg/m. If this is out of the aforementioned range listed, please consider follow up with your Primary Care Provider.  If you are age 68 or younger, your body mass index should be between 19-25. Your Body mass index is 21.2 kg/m. If this is out of the aformentioned range listed, please consider follow up with your Primary Care Provider.   ________________________________________________________  The [facility] providers would like to encourage you to use [facility] to communicate with providers for non-urgent requests or questions.  Due to long hold times on the telephone, sending your provider a message by [facility] may be a faster and more efficient way to get a response.  Please allow 48 business hours for a response.  Please remember that this is for non-urgent requests.  _______________________________________________________  Your provider has requested that you go to the basement level for lab work before leaving today. Press "B" on the elevator. The lab is located at the first door on the left as you exit the elevator.  We have sent the following medications to your pharmacy for you to pick up at your convenience:  Lialda  Please follow up on _____________________________

## 2021-03-01 ENCOUNTER — Other Ambulatory Visit: Payer: Self-pay | Admitting: Hematology

## 2021-03-02 ENCOUNTER — Inpatient Hospital Stay: Payer: Medicaid Other

## 2021-03-02 ENCOUNTER — Other Ambulatory Visit: Payer: Self-pay

## 2021-03-02 VITALS — BP 125/84 | HR 78 | Temp 98.5°F | Resp 17

## 2021-03-02 DIAGNOSIS — D508 Other iron deficiency anemias: Secondary | ICD-10-CM

## 2021-03-02 DIAGNOSIS — O99013 Anemia complicating pregnancy, third trimester: Secondary | ICD-10-CM | POA: Diagnosis not present

## 2021-03-02 LAB — QUANTIFERON-TB GOLD PLUS
Mitogen-NIL: 4.26 IU/mL
NIL: 0.05 IU/mL
QuantiFERON-TB Gold Plus: NEGATIVE
TB1-NIL: <0 IU/mL
TB2-NIL: <0 IU/mL

## 2021-03-02 LAB — THIOPURINE METHYLTRANSFERASE (TPMT), RBC: Thiopurine Methyltransferase, RBC: 17 nmol/hr/mL RBC

## 2021-03-02 MED ORDER — LORATADINE 10 MG PO TABS
10.0000 mg | ORAL_TABLET | Freq: Once | ORAL | Status: AC
  Administered 2021-03-02: 10 mg via ORAL
  Filled 2021-03-02: qty 1

## 2021-03-02 MED ORDER — ACETAMINOPHEN 325 MG PO TABS
650.0000 mg | ORAL_TABLET | Freq: Once | ORAL | Status: AC
  Administered 2021-03-02: 650 mg via ORAL
  Filled 2021-03-02: qty 2

## 2021-03-02 MED ORDER — SODIUM CHLORIDE 0.9 % IV SOLN: Freq: Once | INTRAVENOUS | Status: AC

## 2021-03-02 MED ORDER — SODIUM CHLORIDE 0.9 % IV SOLN
300.0000 mg | Freq: Once | INTRAVENOUS | Status: AC
  Administered 2021-03-02: 300 mg via INTRAVENOUS
  Filled 2021-03-02: qty 300

## 2021-03-02 NOTE — Progress Notes (Signed)
Patient tolerated iron infusion well. Declined to stay for 30 minute post observation period. Vss upon release

## 2021-03-02 NOTE — Patient Instructions (Signed)

## 2021-03-09 ENCOUNTER — Inpatient Hospital Stay: Payer: Medicaid Other

## 2021-03-16 ENCOUNTER — Other Ambulatory Visit: Payer: Self-pay

## 2021-03-16 ENCOUNTER — Inpatient Hospital Stay: Payer: Medicaid Other | Attending: Hematology and Oncology

## 2021-03-16 VITALS — BP 111/77 | HR 82 | Temp 98.2°F | Resp 18 | Wt 135.8 lb

## 2021-03-16 DIAGNOSIS — O99013 Anemia complicating pregnancy, third trimester: Secondary | ICD-10-CM | POA: Insufficient documentation

## 2021-03-16 DIAGNOSIS — D500 Iron deficiency anemia secondary to blood loss (chronic): Secondary | ICD-10-CM | POA: Insufficient documentation

## 2021-03-16 DIAGNOSIS — D508 Other iron deficiency anemias: Secondary | ICD-10-CM

## 2021-03-16 DIAGNOSIS — Z79899 Other long term (current) drug therapy: Secondary | ICD-10-CM | POA: Insufficient documentation

## 2021-03-16 DIAGNOSIS — E538 Deficiency of other specified B group vitamins: Secondary | ICD-10-CM | POA: Diagnosis not present

## 2021-03-16 DIAGNOSIS — Z3A00 Weeks of gestation of pregnancy not specified: Secondary | ICD-10-CM | POA: Diagnosis not present

## 2021-03-16 MED ORDER — SODIUM CHLORIDE 0.9 % IV SOLN: Freq: Once | INTRAVENOUS | Status: AC

## 2021-03-16 MED ORDER — SODIUM CHLORIDE 0.9 % IV SOLN
300.0000 mg | Freq: Once | INTRAVENOUS | Status: AC
  Administered 2021-03-16: 16:00:00 300 mg via INTRAVENOUS
  Filled 2021-03-16: qty 300

## 2021-03-16 MED ORDER — LORATADINE 10 MG PO TABS
10.0000 mg | ORAL_TABLET | Freq: Once | ORAL | Status: AC
  Administered 2021-03-16: 15:00:00 10 mg via ORAL
  Filled 2021-03-16: qty 1

## 2021-03-16 MED ORDER — ACETAMINOPHEN 325 MG PO TABS
650.0000 mg | ORAL_TABLET | Freq: Once | ORAL | Status: AC
  Administered 2021-03-16: 15:00:00 650 mg via ORAL
  Filled 2021-03-16: qty 2

## 2021-03-16 NOTE — Patient Instructions (Signed)

## 2021-03-16 NOTE — Progress Notes (Signed)
Pt declined to stay for 30 minute post Venofer infusion observation.  Pt tolerated treatment well without incident. VSS at discharge.  Ambulatory to lobby.

## 2021-03-23 ENCOUNTER — Other Ambulatory Visit: Payer: Self-pay

## 2021-03-23 ENCOUNTER — Inpatient Hospital Stay: Payer: Medicaid Other

## 2021-03-23 VITALS — BP 110/80 | HR 68 | Temp 98.4°F | Resp 18

## 2021-03-23 DIAGNOSIS — O99013 Anemia complicating pregnancy, third trimester: Secondary | ICD-10-CM | POA: Diagnosis not present

## 2021-03-23 DIAGNOSIS — D508 Other iron deficiency anemias: Secondary | ICD-10-CM

## 2021-03-23 MED ORDER — SODIUM CHLORIDE 0.9 % IV SOLN
300.0000 mg | Freq: Once | INTRAVENOUS | Status: AC
  Administered 2021-03-23: 09:00:00 300 mg via INTRAVENOUS
  Filled 2021-03-23: qty 300

## 2021-03-23 MED ORDER — LORATADINE 10 MG PO TABS
10.0000 mg | ORAL_TABLET | Freq: Once | ORAL | Status: AC
  Administered 2021-03-23: 09:00:00 10 mg via ORAL
  Filled 2021-03-23: qty 1

## 2021-03-23 MED ORDER — SODIUM CHLORIDE 0.9 % IV SOLN: Freq: Once | INTRAVENOUS | Status: AC

## 2021-03-23 MED ORDER — ACETAMINOPHEN 325 MG PO TABS
650.0000 mg | ORAL_TABLET | Freq: Once | ORAL | Status: AC
  Administered 2021-03-23: 09:00:00 650 mg via ORAL
  Filled 2021-03-23: qty 2

## 2021-03-23 NOTE — Patient Instructions (Signed)

## 2021-03-23 NOTE — Progress Notes (Addendum)
Pt declined to stay for 30 min post obs. VSS upon discharge

## 2021-04-06 ENCOUNTER — Other Ambulatory Visit: Payer: Self-pay

## 2021-04-06 DIAGNOSIS — D509 Iron deficiency anemia, unspecified: Secondary | ICD-10-CM

## 2021-04-07 ENCOUNTER — Inpatient Hospital Stay: Payer: Medicaid Other | Admitting: Hematology

## 2021-04-07 ENCOUNTER — Inpatient Hospital Stay: Payer: Medicaid Other | Attending: Hematology and Oncology

## 2021-04-19 ENCOUNTER — Ambulatory Visit: Payer: Medicaid Other | Admitting: Internal Medicine

## 2021-05-23 ENCOUNTER — Encounter: Payer: Self-pay | Admitting: Internal Medicine

## 2021-05-23 ENCOUNTER — Ambulatory Visit (INDEPENDENT_AMBULATORY_CARE_PROVIDER_SITE_OTHER): Payer: Medicaid Other | Admitting: Internal Medicine

## 2021-05-23 VITALS — BP 102/68 | HR 79 | Ht 65.0 in | Wt 138.1 lb

## 2021-05-23 DIAGNOSIS — K5100 Ulcerative (chronic) pancolitis without complications: Secondary | ICD-10-CM | POA: Diagnosis not present

## 2021-05-23 MED ORDER — MESALAMINE 1.2 G PO TBEC: 4.8000 g | DELAYED_RELEASE_TABLET | Freq: Every day | ORAL | 1 refills | Status: DC

## 2021-05-23 NOTE — Progress Notes (Signed)
° °  Chief Complaint: UC, diarrhea  HPI : 36 year old female with history of pan-UC and anemia presents with UC and diarrhea  Interval History: Just about 2 weeks ago, she started having issues with diarrhea and bloody stools again.  She also notes abdominal pain due to her diarrhea and bloody stools.  Prior to that she was doing very well with complete resolution of her GI symptoms.  She had already completed her prednisone therapy.  She never picked up her mesalamine due to some concerns about cost and difficulty getting to the pharmacy while taking care of her young infant at home.  Her baby is doing well at this time.  Current Outpatient Medications  Medication Sig Dispense Refill   mesalamine (LIALDA) 1.2 g EC tablet Take 2 tablets (2.4 g total) by mouth daily with breakfast. (Patient not taking: Reported on 05/23/2021) 180 tablet 3   Prenatal Vit-Fe Fumarate-FA (PRENATAL MULTIVITAMIN) TABS tablet Take 1 tablet by mouth daily at 12 noon. 60 tablet 3   No current facility-administered medications for this visit.   Review of Systems: All systems reviewed and negative except where noted in HPI.   Physical Exam: BP 102/68    Pulse 79    Ht 5\' 5"  (1.651 m)    Wt 138 lb 2 oz (62.7 kg)    SpO2 100%    BMI 22.99 kg/m  Constitutional: Pleasant,well-developed, female in no acute distress. HEENT: Normocephalic and atraumatic. Conjunctivae are normal. No scleral icterus. Cardiovascular: Normal rate, regular rhythm.  Pulmonary/chest: Effort normal and breath sounds normal. No wheezing, rales or rhonchi. Abdominal: Soft, nondistended, nontender. Bowel sounds active throughout. There are no masses palpable. No hepatomegaly. Extremities: No edema Neurological: Alert and oriented to person place and time. Skin: Skin is warm and dry. No rashes noted. Psychiatric: Normal mood and affect. Behavior is normal.  Labs 12/2020: CBC unremarkable except for elevated platelet count of 474, CMP with  hypokalemia  CT A/P w/contrast 03/21/15: IMPRESSION: 1. Pancolitis. Given the findings on the prior study and associated prominent lymph nodes in the mesentery and perirectal space, ulcerative colitis is favored. Pseudomembranous colitis considered less likely. 2. No evidence of bowel obstruction, perforation, abscess or appendicitis. 3. Probable focal fat in the left hepatic lobe.  Flexible sigmoidoscopy 03/23/15: ENDOSCOPIC IMPRESSION: 1. Severe Pancolitis. One G.I. pathogen panel is negative for enteric pathogens and will be repeated. The most likely etiology is Crohn's or ulcerative colitis. Path: Colon, biopsy, random - CHRONIC MILDLY ACTIVE COLITIS. - SEE MICROSCOPIC DESCRIPTION Microscopic Comment The lamina propria is markedly expanded by increased inflammatory infiltrate with focal reactive lymphoid aggregates. There is moderate to focally marked crypt distortion and there is focal ulceration. The findings are consistent with chronic minimally to mildly active inflammatory bowel disease. No granulomas or dysplasia is identified.  ASSESSMENT AND PLAN:  Ulcerative pan-colitis First diagnosed in 2016. Patient has had a recurrence in her GI symptoms after not starting maintenance therapy with mesalamine. Will attempt to re-induce her by starting her on mesalamine therapy this time. If mesalamine is not effective, then can consider starting her on prednisone therapy again. Tried to emphasize the importance of keeping her UC under control to reduce her risk of developing complications from UC in the future, such as colon cancer. - Check CBC, BMP, CRP - Start Lialda 4800 mg QD - Will be due for colonoscopy in 2024 for surveillance - RTC in 2 months  Christia Reading, MD

## 2021-05-23 NOTE — Patient Instructions (Signed)
Your provider has requested that you go to the basement level for lab work before leaving today. Press "B" on the elevator. The lab is located at the first door on the left as you exit the elevator.  We have sent the following medications to your pharmacy for you to pick up at your convenience: [place].  Please contact our office if this medication is too expensive. Also, contact your insurance company to find out what is your formulary medications.    Due to recent changes in healthcare laws, you may see the results of your imaging and laboratory studies on MyChart before your provider has had a chance to review them.  We understand that in some cases there may be results that are confusing or concerning to you. Not all laboratory results come back in the same time frame and the provider may be waiting for multiple results in order to interpret others.  Please give [place] 48 hours in order for your provider to thoroughly review all the results before contacting the office for clarification of your results.   Thank you for choosing me and [facility].  Pricilla Riffle. Dagoberto Ligas., MD., Marval Regal

## 2021-07-12 ENCOUNTER — Encounter: Payer: Self-pay | Admitting: Internal Medicine

## 2021-07-13 ENCOUNTER — Other Ambulatory Visit: Payer: Self-pay

## 2021-07-13 MED ORDER — PREDNISONE 20 MG PO TABS: 40.0000 mg | ORAL_TABLET | Freq: Every day | ORAL | 0 refills | Status: AC

## 2021-07-21 ENCOUNTER — Encounter: Payer: Self-pay | Admitting: Internal Medicine

## 2021-07-21 ENCOUNTER — Other Ambulatory Visit (INDEPENDENT_AMBULATORY_CARE_PROVIDER_SITE_OTHER): Payer: Medicaid Other

## 2021-07-21 ENCOUNTER — Ambulatory Visit (INDEPENDENT_AMBULATORY_CARE_PROVIDER_SITE_OTHER): Payer: Medicaid Other | Admitting: Internal Medicine

## 2021-07-21 VITALS — BP 100/62 | HR 68 | Ht 65.0 in | Wt 142.4 lb

## 2021-07-21 DIAGNOSIS — K5100 Ulcerative (chronic) pancolitis without complications: Secondary | ICD-10-CM | POA: Diagnosis not present

## 2021-07-21 LAB — CBC WITH DIFFERENTIAL/PLATELET
Basophils Absolute: 0.1 10*3/uL (ref 0.0–0.1)
Basophils Relative: 0.9 % (ref 0.0–3.0)
Eosinophils Absolute: 1 10*3/uL — ABNORMAL HIGH (ref 0.0–0.7)
Eosinophils Relative: 14.7 % — ABNORMAL HIGH (ref 0.0–5.0)
HCT: 40.1 % (ref 36.0–46.0)
Hemoglobin: 13.1 g/dL (ref 12.0–15.0)
Lymphocytes Relative: 19.7 % (ref 12.0–46.0)
Lymphs Abs: 1.4 10*3/uL (ref 0.7–4.0)
MCHC: 32.6 g/dL (ref 30.0–36.0)
MCV: 94.4 fl (ref 78.0–100.0)
Monocytes Absolute: 0.7 10*3/uL (ref 0.1–1.0)
Monocytes Relative: 10.3 % (ref 3.0–12.0)
Neutro Abs: 3.9 10*3/uL (ref 1.4–7.7)
Neutrophils Relative %: 54.4 % (ref 43.0–77.0)
Platelets: 452 10*3/uL — ABNORMAL HIGH (ref 150.0–400.0)
RBC: 4.25 Mil/uL (ref 3.87–5.11)
RDW: 13.7 % (ref 11.5–15.5)
WBC: 7.1 10*3/uL (ref 4.0–10.5)

## 2021-07-21 LAB — BASIC METABOLIC PANEL
BUN: 11 mg/dL (ref 6–23)
CO2: 29 mEq/L (ref 19–32)
Calcium: 9.5 mg/dL (ref 8.4–10.5)
Chloride: 104 mEq/L (ref 96–112)
Creatinine, Ser: 0.92 mg/dL (ref 0.40–1.20)
GFR: 79.81 mL/min (ref >60.00)
Glucose, Bld: 91 mg/dL (ref 70–99)
Potassium: 4 mEq/L (ref 3.5–5.1)
Sodium: 139 mEq/L (ref 135–145)

## 2021-07-21 LAB — C-REACTIVE PROTEIN: CRP: <1 mg/dL (ref 0.5–20.0)

## 2021-07-21 MED ORDER — MESALAMINE 1.2 G PO TBEC: 4.8000 g | DELAYED_RELEASE_TABLET | Freq: Every day | ORAL | 1 refills | Status: DC

## 2021-07-21 NOTE — Progress Notes (Signed)
? ?Chief Complaint: UC, diarrhea ? ?HPI : 36 year old female with history of pan-UC and anemia presents with UC and diarrhea ? ?Interval History: She developed some diarrhea a week ago that lasted for about a day. She took one dose of prednisone after she had already started feeling better but hasn't taken any more. She has been taking the Lialda 4800 mg as prescribed. She has been good about keeping up with the [place]. Not missing doses. Otherwise denies blood in stools or abdominal pain. She is eating well. Her baby daughter is doing well at home, about 36 months old now.  ? ?Wt Readings from Last 3 Encounters:  ?07/21/21 142 lb 6.4 oz (64.6 kg)  ?05/23/21 138 lb 2 oz (62.7 kg)  ?03/16/21 135 lb 12 oz (61.6 kg)  ? ? ?Current Outpatient Medications  ?Medication Sig Dispense Refill  ? Prenatal Vit-Fe Fumarate-FA (PRENATAL MULTIVITAMIN) TABS tablet Take 1 tablet by mouth daily at 12 noon. 60 tablet 3  ? mesalamine (LIALDA) 1.2 g EC tablet Take 4 tablets (4.8 g total) by mouth daily with breakfast. 180 tablet 1  ? predniSONE (DELTASONE) 20 MG tablet Take 2 tablets (40 mg total) by mouth daily for 14 days. (Patient not taking: Reported on 07/21/2021) 28 tablet 0  ? ?No current facility-administered medications for this visit.  ? ?Review of Systems: ?All systems reviewed and negative except where noted in HPI.  ? ?Physical Exam: ?BP 100/62   Pulse 68   Ht '5\' 5"'$  (1.651 m)   Wt 142 lb 6.4 oz (64.6 kg)   BMI 23.70 kg/m?  ?Constitutional: Pleasant,well-developed, female in no acute distress. ?HEENT: Normocephalic and atraumatic. Conjunctivae are normal. No scleral icterus. ?Cardiovascular: Normal rate, regular rhythm.  ?Pulmonary/chest: Effort normal and breath sounds normal. No wheezing, rales or rhonchi. ?Abdominal: Soft, nondistended, nontender. Bowel sounds active throughout. There are no masses palpable. No hepatomegaly. ?Extremities: No edema ?Neurological: Alert and oriented to person place and time. ?Skin: Skin  is warm and dry. No rashes noted. ?Psychiatric: Normal mood and affect. Behavior is normal. ? ?Labs 12/2020: CBC unremarkable except for elevated platelet count of 474, CMP with hypokalemia ? ?CT A/P w/contrast 03/21/15: ?IMPRESSION: ?1. Pancolitis. Given the findings on the prior study and associated ?prominent lymph nodes in the mesentery and perirectal space, ?ulcerative colitis is favored. Pseudomembranous colitis considered ?less likely. ?2. No evidence of bowel obstruction, perforation, abscess or ?appendicitis. ?3. Probable focal fat in the left hepatic lobe. ? ?Flexible sigmoidoscopy 03/23/15: ?ENDOSCOPIC IMPRESSION: ?1. Severe Pancolitis. One G.I. pathogen panel is negative for enteric pathogens and will be repeated. The most likely etiology is Crohn's or ulcerative colitis. ?Path: ?Colon, biopsy, random ?- CHRONIC MILDLY ACTIVE COLITIS. ?- SEE MICROSCOPIC DESCRIPTION ?Microscopic Comment ?The lamina propria is markedly expanded by increased inflammatory infiltrate with focal reactive lymphoid aggregates. There is moderate to focally marked crypt distortion and there is focal ulceration. The findings are consistent with chronic minimally to mildly active inflammatory bowel disease. No granulomas or dysplasia is identified. ? ?ASSESSMENT AND PLAN: ? 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 Patient has been doing well on mesalamine therapy. Had a temporary worsening in diarrhea last week, which resolved after a day and is suspected to be due to an episode of gastroenteritis. She is overall doing really well. Will check her maintenance labs to make sure that she is tolerating the mesalamine well. ?- Check CBC, BMP, CRP ?- Continue Lialda 4800 mg QD. Refilled today ?- Will plan for colonoscopy in  early 2024 ?- RTC in 3 months ? ?Christia Reading, MD ? ?I spent 40 minutes of time, including in depth chart review, independent review of results as outlined above, communicating results with the patient  directly, face-to-face time with the patient, coordinating care, ordering studies and medications as appropriate, and documentation. ? ?

## 2021-07-21 NOTE — Patient Instructions (Signed)
If you are age 37 or older, your body mass index should be between 23-30. Your Body mass index is 23.7 kg/m?Marland Kitchen If this is out of the aforementioned range listed, please consider follow up with your Primary Care Provider. ? ?If you are age 64 or younger, your body mass index should be between 19-25. Your Body mass index is 23.7 kg/m?Marland Kitchen If this is out of the aformentioned range listed, please consider follow up with your Primary Care Provider.  ? ?Your provider has requested that you go to the basement level for lab work before leaving today. Press "B" on the elevator. The lab is located at the first door on the left as you exit the elevator. ? ? ?The [facility] providers would like to encourage you to use [facility] to communicate with providers for non-urgent requests or questions.  Due to long hold times on the telephone, sending your provider a message by [facility] may be a faster and more efficient way to get a response.  Please allow 48 business hours for a response.  Please remember that this is for non-urgent requests.  ? ?Due to recent changes in healthcare laws, you may see the results of your imaging and laboratory studies on MyChart before your provider has had a chance to review them.  We understand that in some cases there may be results that are confusing or concerning to you. Not all laboratory results come back in the same time frame and the provider may be waiting for multiple results in order to interpret others.  Please give [place] 48 hours in order for your provider to thoroughly review all the results before contacting the office for clarification of your results.  ? ?It was a pleasure to see you today! ? ?Thank you for trusting me with your gastrointestinal care!   ? ?Christia Reading, MD  ? ?

## 2022-04-11 ENCOUNTER — Other Ambulatory Visit: Payer: Self-pay | Admitting: Obstetrics and Gynecology

## 2022-04-11 DIAGNOSIS — Z363 Encounter for antenatal screening for malformations: Secondary | ICD-10-CM

## 2022-04-21 ENCOUNTER — Encounter: Payer: Self-pay | Admitting: Obstetrics and Gynecology

## 2022-04-27 ENCOUNTER — Encounter: Payer: Self-pay | Admitting: *Deleted

## 2022-04-28 ENCOUNTER — Other Ambulatory Visit: Payer: Self-pay

## 2022-05-03 ENCOUNTER — Ambulatory Visit: Payer: Medicaid Other | Attending: Obstetrics and Gynecology

## 2022-05-03 ENCOUNTER — Ambulatory Visit: Payer: Medicaid Other | Attending: Obstetrics | Admitting: Obstetrics

## 2022-05-03 ENCOUNTER — Ambulatory Visit: Payer: Medicaid Other | Admitting: *Deleted

## 2022-05-03 ENCOUNTER — Other Ambulatory Visit: Payer: Self-pay | Admitting: *Deleted

## 2022-05-03 VITALS — BP 107/69 | HR 108

## 2022-05-03 DIAGNOSIS — Z363 Encounter for antenatal screening for malformations: Secondary | ICD-10-CM | POA: Insufficient documentation

## 2022-05-03 DIAGNOSIS — O30042 Twin pregnancy, dichorionic/diamniotic, second trimester: Secondary | ICD-10-CM

## 2022-05-03 DIAGNOSIS — O280 Abnormal hematological finding on antenatal screening of mother: Secondary | ICD-10-CM | POA: Diagnosis not present

## 2022-05-03 DIAGNOSIS — Z3A21 21 weeks gestation of pregnancy: Secondary | ICD-10-CM

## 2022-05-03 DIAGNOSIS — O09522 Supervision of elderly multigravida, second trimester: Secondary | ICD-10-CM | POA: Diagnosis not present

## 2022-05-03 DIAGNOSIS — O358XX1 Maternal care for other (suspected) fetal abnormality and damage, fetus 1: Secondary | ICD-10-CM | POA: Diagnosis not present

## 2022-05-03 DIAGNOSIS — O09299 Supervision of pregnancy with other poor reproductive or obstetric history, unspecified trimester: Secondary | ICD-10-CM

## 2022-05-03 DIAGNOSIS — O358XX0 Maternal care for other (suspected) fetal abnormality and damage, not applicable or unspecified: Secondary | ICD-10-CM

## 2022-05-03 NOTE — Progress Notes (Signed)
MFM Note  Monica Green was seen due to a spontaneously conceived twin pregnancy.  She denies any significant past medical history and denies any problems in her current pregnancy.     The patient had a cell free DNA test that indicated a high risk for trisomy 21 (Down syndrome).  These are predicted to be dizygotic/fraternal twins.  A female and female fetus is predicted.     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  The overall EFW's for both twin A and twin B measured 1 to 2 weeks behind her dates.  She reports that her [facility] of September 12, 2022 was based on her LMP.  There were no obvious structural anomalies noted in twin A (the female fetus).  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  I do not believe that twin B has an AV canal defect as the mitral and tricuspid valves appears to be implanted on the crux of the heart.  The outflow tracts of the fetal heart were difficult to visualize due to fetal movements.    The patient was advised that due to her cell free DNA test indicating a high risk for trisomy 21, along with today's ultrasound findings, that twin B is at high risk for having Down syndrome.    The patient was advised regarding the availability of an amniocentesis for definitive diagnosis of fetal chromosomal abnormalities.    As this is a twin gestation, she understands that an amniocentesis would have to be performed for each fetus in order to determine if either fetus has Down syndrome.  The risks associated with an amniocentesis procedure were discussed today.  The implications of a baby with Down syndrome was also discussed.  The patient understands that should either fetus be confirmed to have Down syndrome, that either continued expectant management or selective reduction may be an option.    Should she elect for a selective reduction, we would have to  refer her to another state such as [place] for the procedure.  Most states will allow a selective reduction procedure up until 23 to 24 weeks.  As the patient was overwhelmed with the information that was discussed with her today, I advised her to go home and think about what she would like to do next.  I have scheduled a meeting for her tomorrow with our genetic counselor.    She will make an informed decision regarding the amniocentesis after she meets with the genetic counselor.  I will perform the amniocentesis for her tomorrow should she want the procedure to be done.  Due to the increased risk of Down syndrome and as a large VSD was noted in twin B, she was referred to [facility] pediatric cardiology for a fetal echocardiogram.  I will speak to her again tomorrow regarding how she would like to proceed with the pregnancy.    As twin gestations are at increased risk for developing preeclampsia, she should start taking 2 tablets of baby aspirin (81 mg each) for preeclampsia prophylaxis.    The patient and her mother stated that all of their questions were answered today.  A total of 60 minutes was spent counseling and coordinating the care for this patient.  Greater than 50% of the time was spent in direct face-to-face contact.

## 2022-05-04 ENCOUNTER — Ambulatory Visit (HOSPITAL_BASED_OUTPATIENT_CLINIC_OR_DEPARTMENT_OTHER): Payer: Medicaid Other

## 2022-05-04 DIAGNOSIS — Z3A21 21 weeks gestation of pregnancy: Secondary | ICD-10-CM | POA: Diagnosis not present

## 2022-05-04 DIAGNOSIS — O3513X2 Maternal care for (suspected) chromosomal abnormality in fetus, trisomy 21, fetus 2: Secondary | ICD-10-CM

## 2022-05-04 DIAGNOSIS — O30042 Twin pregnancy, dichorionic/diamniotic, second trimester: Secondary | ICD-10-CM | POA: Diagnosis not present

## 2022-05-04 NOTE — Progress Notes (Signed)
[facility] at [facility] [address], [address] Phone:  [phone]   Fax:  [phone]    Name: Monica Green Indication: cfDNA indicating increased risk for Down syndrome in the current twin pregnancy  DOB: 1985-06-11 Age: 37 y.o.   EDD: 09/12/2022 LMP: 12/06/2021 Referring Provider:  Ty Hilts, NP  EGA: [id number] Counselor: AStaci Righter MS, CGC  OB Hx:: [id number] of Appointment: 05/04/2022  Accompanied by: Father of the pregnancy, JMartiniqueJefferson Face to Face Time: 623Minutes   Previous Testing Completed: CBC from 07/21/2021 reviewed. MCV within normal limits. It is unlikely that MTeliahis a beta thalassemia carrier or an alpha thalassemia carrier of the double-gene deletion. Individuals with a normal MCV may be single-gene deletion carriers, but it is unlikely that the current pregnancy would be affected with alpha or beta thalassemia major. Hemoglobin fractionation cascade from 07/12/2020 reviewed. No abnormal hemoglobin bands were noted. MVoulapreviously completed carrier screening. She screened to not be a carrier for Cystic Fibrosis (CF), Spinal Muscular Atrophy (SMA), Fragile X syndrome, and Duchenne/Becker Muscular Dystrophy. A negative result on carrier screening reduces the likelihood of being a carrier, however, does not entirely rule out the possibility. Oksana previously completed a maternal serum AFP screen in this pregnancy. The result is screen negative. A negative result reduces the risk that the current pregnancy has an open neural tube defect. Closed neural tube defects and some open defects may not be detected by this screen.    Medical History:  Personal history of preeclampsia in her previous pregnancy. Denies personal history of diabetes, thyroid conditions, and seizures. Denies bleeding, infections, and fevers in this pregnancy. Denies using tobacco, alcohol, or street drugs in this pregnancy.    Family History: A pedigree was created and scanned into Epic under the Media tab. MArianareports her father passed away from colon cancer.  Maternal ethnicity reported as African American. Declined to provide paternal ethnicity. Denies Ashkenazi Jewish ancestry. Family history not remarkable for consanguinity, individuals with birth defects, intellectual disability, autism spectrum disorder, multiple spontaneous abortions, still births, or unexplained neonatal death.     Genetic Counseling:   Down syndrome suspected in the current pregnancy. Gaynell previously completed cell-free DNA screening (cfDNA). This screen analyzes cell-free DNA originating from the placenta that is found in the maternal blood circulation during pregnancy. This test can provide information regarding the presence or absence of extra fetal (placental) DNA for chromosomes 13, 18 and 21 as well as the sex chromosomes. MLarenewas referred for genetic counseling as results from her cfDNA indicate an increased risk for Trisomy 262(Down syndrome). Of importance to note is that this is a dichorionic diamniotic dizygotic twin pregnancy, and the cfDNA screen cannot inform if one or both twins is at increased risk to be affected with Down syndrome.   We reviewed that possible prenatal ultrasound findings associated with Down syndrome include increased nuchal translucency/cystic hygroma, absent nasal bone, ventriculomegaly, heart defects, an echogenic intracardiac focus, gastrointestinal abnormalities such as duodenal atresia, pyelectasis, shortened long bones, and an increased space between the first and second toes. The couple was counseled that approximately 50% of pregnancies with Down syndrome do not demonstrate signs of the condition on prenatal ultrasound, therefore a normal appearing ultrasound would not rule out the possibility of the pregnancy being affected. Postnatally, all individuals with Down syndrome have intellectual  disability and developmental delays, though a wide spectrum of ability is noted. Behavioral/emotional problems  that are possible include anxiety, depression, ADHD, and autism. The degree of intellectual disability/developmental delays and presence of behavioral/emotional problems an individual with Down syndrome will experience cannot be predicted prenatally and early intervention/special education are instrumental for the achievement of developmental milestones after birth. Several craniofacial differences are common in individuals with Down syndrome, including a flattened facial profile, almond-shaped upward-slanting eyes, a small mouth, protruding tongue, small ears, and microcephaly. Other physical features may include a short neck, small hands and feet, fifth finger clinodactyly, and a single palmar crease. Approximately 60% of affected individuals have vision problems, including cataracts, strabismus, and glaucoma among other vision abnormalities. Ear infections, respiratory infections, and obstructive sleep apnea are common, and 40-75% of individuals experience hearing loss. Half of all individuals with Down syndrome have heart defects, with atrioventricular septal defects being the most common. Musculoskeletal features may include hypotonia, short stature, joint laxity, decreased bone mass with an increased risk of fractures, hip dislocations, and atlantoaxial instability. Gastrointestinal problems including duodenal atresia, small bowel stenosis, annular pancreas, imperforate anus, Hirschsprung disease, reflux, constipation, diarrhea, and Celiac disease affect 10-15% of individuals with Down syndrome. Individuals may also experience thyroid dysfunction and delayed puberty. There is also a slightly increased risk of leukemia. In regards to the potential prognosis of pregnancies with Down syndrome, up to 30% will end in miscarriage or stillbirth from 9 weeks' gestation until term. Of liveborn children, 96%  survive the first year of life and 90% survive the first 10 years of life. We discussed that the median lifespan of individuals with Down syndrome is in their 73s. Many affected individuals participate in community sports and activities, have friends, and are active members of their families. Some adults with Down syndrome are able to care for themselves, live independently, and hold steady jobs while others depend on their families for care and hands-on assistance with daily activities.  We discussed that Down syndrome typically results from having three copies of chromosome 21. This most often occurs due to a random error in chromosomal division during the formation of reproductive cells in a process called nondisjunction. A small percentage of cases are caused by a chromosomal rearrangement (translocation) involving chromosome 21. A translocation in a person's genetics occurs when there is an exchange in chromosome material, such as when a segment of one chromosome breaks off and reattaches to a different chromosome. If a parent carries a balanced translocation they do not have any extra or missing genetic material. However, they would have an increased risk to pass unbalanced chromosomes to their offspring. There are several possible explanations for Nanna's high risk cfDNA result. First, one or both twins could truly be affected by Down syndrome. Second, one or both twins could be mosaic for Down syndrome. Mosaicism occurs when an individual has two or more genetically different sets of cells in their body. Individuals who are mosaic for Down syndrome have some cells in the body with three copies of the 21st chromosome and may have other cells that are chromosomally normal. Third, one or both of the placentas could have Down syndrome while the twins themselves could be unaffected. This is a phenomenon known as confined placental mosaicism (CPM). Lastly, this could be a false positive result. The risk for  Down syndrome after the cfDNA screen is quoted by the laboratory to be 88 out of 100. Given the finding of a VSD for twin B, Philamena was counseled that this twin is more likely to be affected with Down syndrome.  Landra was counseled regarding diagnostic testing via amniocentesis. This procedure involves the removal of a small amount of amniotic fluid from the sacs surrounding both twins. Ultrasound guidance is used throughout the procedure. Possible procedural difficulties and complications that can arise include maternal infection, cramping, bleeding, fluid leakage, and/or pregnancy loss. The risk for pregnancy loss with each amniocentesis is 1/500. We reviewed the various testing options that could be ordered on an amniocentesis sample, including information about what each test is looking for, turnaround time, and the benefits/limitations of each option. Specifically, we discussed the options of FISH and a fetal karyotype. Khristy was informed that diagnostic testing is the only way to definitively determine if a pregnancy is affected by Down syndrome prenatally.  If Kamali opted for diagnostic testing and results confirmed a diagnosis of Down syndrome, it could impact pregnancy management in several different ways. Some individuals may choose to terminate a pregnancy or consider adoption if a diagnosis of Down syndrome were confirmed. Termination for the indication of Down syndrome is not currently available in the state of [place], but is available in neighboring states such as [place]. For individuals who would not alter their pregnancy management regardless of testing outcomes, a prenatal diagnosis could allow for delivery planning and prenatal consults with various specialists that would be involved in the infant's care as well as time to plan and prepare emotionally, physically, and financially. Jovonda was also made aware that she has the option of continuing to monitor the pregnancy with  routine ultrasounds and pursue genetic testing postnatally.  Monserath and [place] appeared to understand all that was discussed in the genetic counseling session. They shared with genetic counseling that they are not interested in amniocentesis for prenatal diagnosis at this time. They are fully committed to this pregnancy and are looking forward to welcoming these babies into their family in June.      Patient Plan:  Proceed with: Routine prenatal care Informed consent was obtained. All questions were answered.  Declined: Amniocentesis   Thank you for sharing in the care of [place] with [place]. Please do not hesitate to contact us if you have any questions.  Staci Righter, MS, [facility]

## 2022-05-26 ENCOUNTER — Other Ambulatory Visit: Payer: Self-pay | Admitting: Obstetrics

## 2022-05-26 ENCOUNTER — Ambulatory Visit: Payer: Medicaid Other | Attending: Obstetrics

## 2022-05-26 ENCOUNTER — Ambulatory Visit: Payer: Medicaid Other | Admitting: *Deleted

## 2022-05-26 ENCOUNTER — Ambulatory Visit: Payer: Medicaid Other

## 2022-05-26 ENCOUNTER — Ambulatory Visit (HOSPITAL_BASED_OUTPATIENT_CLINIC_OR_DEPARTMENT_OTHER): Payer: Medicaid Other | Admitting: Maternal & Fetal Medicine

## 2022-05-26 VITALS — BP 104/71 | HR 102

## 2022-05-26 DIAGNOSIS — O365912 Maternal care for other known or suspected poor fetal growth, first trimester, fetus 2: Secondary | ICD-10-CM | POA: Diagnosis not present

## 2022-05-26 DIAGNOSIS — O09299 Supervision of pregnancy with other poor reproductive or obstetric history, unspecified trimester: Secondary | ICD-10-CM | POA: Diagnosis present

## 2022-05-26 DIAGNOSIS — O285 Abnormal chromosomal and genetic finding on antenatal screening of mother: Secondary | ICD-10-CM

## 2022-05-26 DIAGNOSIS — O09522 Supervision of elderly multigravida, second trimester: Secondary | ICD-10-CM

## 2022-05-26 DIAGNOSIS — O35BXX2 Maternal care for other (suspected) fetal abnormality and damage, fetal cardiac anomalies, fetus 2: Secondary | ICD-10-CM | POA: Diagnosis not present

## 2022-05-26 DIAGNOSIS — O09292 Supervision of pregnancy with other poor reproductive or obstetric history, second trimester: Secondary | ICD-10-CM

## 2022-05-26 DIAGNOSIS — O99112 Other diseases of the blood and blood-forming organs and certain disorders involving the immune mechanism complicating pregnancy, second trimester: Secondary | ICD-10-CM

## 2022-05-26 DIAGNOSIS — O35AXX2 Maternal care for other (suspected) fetal abnormality and damage, fetal facial anomalies, fetus 2: Secondary | ICD-10-CM

## 2022-05-26 DIAGNOSIS — D75839 Thrombocytosis, unspecified: Secondary | ICD-10-CM

## 2022-05-26 DIAGNOSIS — O35EXX2 Maternal care for other (suspected) fetal abnormality and damage, fetal genitourinary anomalies, fetus 2: Secondary | ICD-10-CM | POA: Diagnosis not present

## 2022-05-26 DIAGNOSIS — O30042 Twin pregnancy, dichorionic/diamniotic, second trimester: Secondary | ICD-10-CM

## 2022-05-26 DIAGNOSIS — D649 Anemia, unspecified: Secondary | ICD-10-CM

## 2022-05-26 DIAGNOSIS — O99012 Anemia complicating pregnancy, second trimester: Secondary | ICD-10-CM

## 2022-05-26 DIAGNOSIS — O43192 Other malformation of placenta, second trimester: Secondary | ICD-10-CM

## 2022-05-26 DIAGNOSIS — O35BXX1 Maternal care for other (suspected) fetal abnormality and damage, fetal cardiac anomalies, fetus 1: Secondary | ICD-10-CM | POA: Diagnosis not present

## 2022-05-26 DIAGNOSIS — Z3A23 23 weeks gestation of pregnancy: Secondary | ICD-10-CM

## 2022-05-26 NOTE — Progress Notes (Signed)
MFM Consult Note Patient Name: Monica Green  Patient MRN:   [id number]  Referring provider: [facility]  Reason for Consult: abnormal soft markers on twin B   HPI: Monica Green is a 37 y.o. G2P1001 at 75w4dhere for ultrasound and consultation.   Sonographic findings Dichorionic diamniotic intrauterine pregnancy at 23 weeks 4 days.  I discussed that her due date is likely September 18, 2022.  While she does know the first day of her last menstrual period she had a very prolonged and irregular cycle preceding this.  That lasted 49 days.  I discussed that using the LMP as the means for dating the pregnancy assumes a normal 28-day cycle.  I also discussed that the fetal measurements are more consistent with using her early ultrasound compared to her menstrual cycle.  Therefore we will use the EDD of 09/18/2022 dated by a 16-week 3-day ultrasound.  Twin A Normal fetal cardiac activity Transverse presentation Interval anatomy appears normal except for the known EIF Fetal weight is at the 16th percentile overall Normal amniotic fluid Placenta is posterior UA dopplers are normal  Twin B Normal fetal cardiac cavity Cephalic Interval anatomy shows UTD A1, hypoplastic nasal bone, and known EIF.  There is also concern for a VSD which is not well-seen today due to limited heart views The fetal weight is at the less than the 1st percentile overall and less than the 2nd percentile at abdominal circumference The amniotic fluid is normal Placenta is anterior UA dopplers are normal    Counseling I discussed the new ultrasound findings in the setting of the patient's positive cell free DNA screening for trisomy 21.  We discussed that it is impossible to know the genetic make-up of each fetus without an amniocentesis prior to birth.  I discussed that it is most likely that the twin B which is a female is the affected fetus given the soft markers seen on today's ultrasound.  I discussed the need for  continued ultrasound surveillance in addition to umbilical artery Dopplers due to new onset fetal growth restriction.  We discussed the increased risk of stillbirth and need for antenatal testing to start later in the pregnancy.  We also discussed the importance of monitoring fetal kick counts.  She verbalized understanding and is agreement to the plan outlined below.  Review of Systems: A review of systems was performed and was negative except per HPI   Vitals and Physical Exam BP 104/71, HR 102, pregravid BMI: 22.96.  Sitting comfortably on the sonogram table Nonlabored breathing Normal rate and rhythm Abdomen is nontender  Genetic testing: high risk NIPS for T21; declines amniocentesis  Recommendations -Serial growth ultrasounds every 4 weeks until delivery -Umbilical artery Dopplers every 1 to 2 weeks until delivery -Weekly NST to start around 28 weeks -Weekly biophysical profiles can start around 32 weeks -Delivery timing will be likely around 36 to 37 weeks but will depend on the upon the clinical course. -Fetal echo is scheduled for 06/01/22 -If fetal ptyalectasis persists a pediatric urology consult can be arranged after birth   I spent 30 minutes reviewing the patients chart, including labs and images as well as counseling the patient about her medical conditions.  BValeda Malm MFM, [place]  05/26/2022  3:46 PM

## 2022-05-31 ENCOUNTER — Other Ambulatory Visit: Payer: Self-pay | Admitting: *Deleted

## 2022-05-31 DIAGNOSIS — O99012 Anemia complicating pregnancy, second trimester: Secondary | ICD-10-CM

## 2022-05-31 DIAGNOSIS — O09299 Supervision of pregnancy with other poor reproductive or obstetric history, unspecified trimester: Secondary | ICD-10-CM

## 2022-05-31 DIAGNOSIS — O365990 Maternal care for other known or suspected poor fetal growth, unspecified trimester, not applicable or unspecified: Secondary | ICD-10-CM

## 2022-05-31 DIAGNOSIS — O280 Abnormal hematological finding on antenatal screening of mother: Secondary | ICD-10-CM

## 2022-05-31 DIAGNOSIS — O35BXX0 Maternal care for other (suspected) fetal abnormality and damage, fetal cardiac anomalies, not applicable or unspecified: Secondary | ICD-10-CM

## 2022-05-31 DIAGNOSIS — O09522 Supervision of elderly multigravida, second trimester: Secondary | ICD-10-CM

## 2022-05-31 DIAGNOSIS — O30042 Twin pregnancy, dichorionic/diamniotic, second trimester: Secondary | ICD-10-CM

## 2022-06-02 ENCOUNTER — Other Ambulatory Visit: Payer: Medicaid Other

## 2022-06-02 ENCOUNTER — Ambulatory Visit: Payer: Medicaid Other

## 2022-06-08 ENCOUNTER — Ambulatory Visit: Payer: Medicaid Other

## 2022-06-08 ENCOUNTER — Ambulatory Visit: Payer: Medicaid Other | Attending: Maternal & Fetal Medicine

## 2022-06-08 ENCOUNTER — Other Ambulatory Visit: Payer: Self-pay | Admitting: Physician Assistant

## 2022-06-08 VITALS — BP 110/62 | HR 98

## 2022-06-08 DIAGNOSIS — O35EXX2 Maternal care for other (suspected) fetal abnormality and damage, fetal genitourinary anomalies, fetus 2: Secondary | ICD-10-CM

## 2022-06-08 DIAGNOSIS — O09522 Supervision of elderly multigravida, second trimester: Secondary | ICD-10-CM | POA: Insufficient documentation

## 2022-06-08 DIAGNOSIS — O30042 Twin pregnancy, dichorionic/diamniotic, second trimester: Secondary | ICD-10-CM

## 2022-06-08 DIAGNOSIS — O99112 Other diseases of the blood and blood-forming organs and certain disorders involving the immune mechanism complicating pregnancy, second trimester: Secondary | ICD-10-CM

## 2022-06-08 DIAGNOSIS — D75839 Thrombocytosis, unspecified: Secondary | ICD-10-CM

## 2022-06-08 DIAGNOSIS — O35BXX1 Maternal care for other (suspected) fetal abnormality and damage, fetal cardiac anomalies, fetus 1: Secondary | ICD-10-CM

## 2022-06-08 DIAGNOSIS — O35BXX2 Maternal care for other (suspected) fetal abnormality and damage, fetal cardiac anomalies, fetus 2: Secondary | ICD-10-CM | POA: Diagnosis not present

## 2022-06-08 DIAGNOSIS — O09299 Supervision of pregnancy with other poor reproductive or obstetric history, unspecified trimester: Secondary | ICD-10-CM | POA: Diagnosis present

## 2022-06-08 DIAGNOSIS — O43192 Other malformation of placenta, second trimester: Secondary | ICD-10-CM | POA: Diagnosis not present

## 2022-06-08 DIAGNOSIS — O99012 Anemia complicating pregnancy, second trimester: Secondary | ICD-10-CM | POA: Insufficient documentation

## 2022-06-08 DIAGNOSIS — O280 Abnormal hematological finding on antenatal screening of mother: Secondary | ICD-10-CM | POA: Diagnosis present

## 2022-06-08 DIAGNOSIS — O35BXX0 Maternal care for other (suspected) fetal abnormality and damage, fetal cardiac anomalies, not applicable or unspecified: Secondary | ICD-10-CM

## 2022-06-08 DIAGNOSIS — O09292 Supervision of pregnancy with other poor reproductive or obstetric history, second trimester: Secondary | ICD-10-CM

## 2022-06-08 DIAGNOSIS — O365990 Maternal care for other known or suspected poor fetal growth, unspecified trimester, not applicable or unspecified: Secondary | ICD-10-CM | POA: Insufficient documentation

## 2022-06-08 DIAGNOSIS — D509 Iron deficiency anemia, unspecified: Secondary | ICD-10-CM

## 2022-06-08 DIAGNOSIS — D649 Anemia, unspecified: Secondary | ICD-10-CM

## 2022-06-08 DIAGNOSIS — O35AXX2 Maternal care for other (suspected) fetal abnormality and damage, fetal facial anomalies, fetus 2: Secondary | ICD-10-CM | POA: Diagnosis not present

## 2022-06-08 DIAGNOSIS — Z3A25 25 weeks gestation of pregnancy: Secondary | ICD-10-CM

## 2022-06-09 ENCOUNTER — Other Ambulatory Visit: Payer: Self-pay

## 2022-06-09 ENCOUNTER — Inpatient Hospital Stay (HOSPITAL_BASED_OUTPATIENT_CLINIC_OR_DEPARTMENT_OTHER): Payer: Medicaid Other | Admitting: Physician Assistant

## 2022-06-09 ENCOUNTER — Inpatient Hospital Stay: Payer: Medicaid Other | Attending: Maternal & Fetal Medicine

## 2022-06-09 ENCOUNTER — Other Ambulatory Visit: Payer: Self-pay | Admitting: *Deleted

## 2022-06-09 ENCOUNTER — Telehealth: Payer: Self-pay

## 2022-06-09 VITALS — BP 106/58 | HR 105 | Temp 97.5°F | Resp 16 | Wt 151.7 lb

## 2022-06-09 DIAGNOSIS — R0602 Shortness of breath: Secondary | ICD-10-CM | POA: Diagnosis not present

## 2022-06-09 DIAGNOSIS — E876 Hypokalemia: Secondary | ICD-10-CM

## 2022-06-09 DIAGNOSIS — O99012 Anemia complicating pregnancy, second trimester: Secondary | ICD-10-CM

## 2022-06-09 DIAGNOSIS — O3513X2 Maternal care for (suspected) chromosomal abnormality in fetus, trisomy 21, fetus 2: Secondary | ICD-10-CM

## 2022-06-09 DIAGNOSIS — Z800 Family history of malignant neoplasm of digestive organs: Secondary | ICD-10-CM | POA: Insufficient documentation

## 2022-06-09 DIAGNOSIS — Z3A24 24 weeks gestation of pregnancy: Secondary | ICD-10-CM | POA: Diagnosis not present

## 2022-06-09 DIAGNOSIS — D509 Iron deficiency anemia, unspecified: Secondary | ICD-10-CM | POA: Diagnosis present

## 2022-06-09 DIAGNOSIS — O35BXX0 Maternal care for other (suspected) fetal abnormality and damage, fetal cardiac anomalies, not applicable or unspecified: Secondary | ICD-10-CM

## 2022-06-09 DIAGNOSIS — O30042 Twin pregnancy, dichorionic/diamniotic, second trimester: Secondary | ICD-10-CM

## 2022-06-09 DIAGNOSIS — O280 Abnormal hematological finding on antenatal screening of mother: Secondary | ICD-10-CM

## 2022-06-09 DIAGNOSIS — Z79899 Other long term (current) drug therapy: Secondary | ICD-10-CM | POA: Insufficient documentation

## 2022-06-09 DIAGNOSIS — O365990 Maternal care for other known or suspected poor fetal growth, unspecified trimester, not applicable or unspecified: Secondary | ICD-10-CM

## 2022-06-09 DIAGNOSIS — E538 Deficiency of other specified B group vitamins: Secondary | ICD-10-CM | POA: Diagnosis not present

## 2022-06-09 DIAGNOSIS — O09522 Supervision of elderly multigravida, second trimester: Secondary | ICD-10-CM

## 2022-06-09 DIAGNOSIS — Q212 Atrioventricular septal defect, unspecified as to partial or complete: Secondary | ICD-10-CM

## 2022-06-09 DIAGNOSIS — O09299 Supervision of pregnancy with other poor reproductive or obstetric history, unspecified trimester: Secondary | ICD-10-CM

## 2022-06-09 LAB — CBC WITH DIFFERENTIAL (CANCER CENTER ONLY)
Abs Immature Granulocytes: 0.04 10*3/uL (ref 0.00–0.07)
Basophils Absolute: 0.1 10*3/uL (ref 0.0–0.1)
Basophils Relative: 1 %
Eosinophils Absolute: 0.4 10*3/uL (ref 0.0–0.5)
Eosinophils Relative: 5 %
HCT: 27.1 % — ABNORMAL LOW (ref 36.0–46.0)
Hemoglobin: 8.3 g/dL — ABNORMAL LOW (ref 12.0–15.0)
Immature Granulocytes: 1 %
Lymphocytes Relative: 17 %
Lymphs Abs: 1.2 10*3/uL (ref 0.7–4.0)
MCH: 22.7 pg — ABNORMAL LOW (ref 26.0–34.0)
MCHC: 30.6 g/dL (ref 30.0–36.0)
MCV: 74.2 fL — ABNORMAL LOW (ref 80.0–100.0)
Monocytes Absolute: 0.8 10*3/uL (ref 0.1–1.0)
Monocytes Relative: 11 %
Neutro Abs: 4.7 10*3/uL (ref 1.7–7.7)
Neutrophils Relative %: 65 %
Platelet Count: 383 10*3/uL (ref 150–400)
RBC: 3.65 MIL/uL — ABNORMAL LOW (ref 3.87–5.11)
RDW: 15.9 % — ABNORMAL HIGH (ref 11.5–15.5)
WBC Count: 7.1 10*3/uL (ref 4.0–10.5)
nRBC: 0 % (ref 0.0–0.2)

## 2022-06-09 LAB — CMP (CANCER CENTER ONLY)
ALT: 9 U/L (ref 0–44)
AST: 12 U/L — ABNORMAL LOW (ref 15–41)
Albumin: 3.2 g/dL — ABNORMAL LOW (ref 3.5–5.0)
Alkaline Phosphatase: 81 U/L (ref 38–126)
Anion gap: 8 (ref 5–15)
BUN: 6 mg/dL (ref 6–20)
CO2: 26 mmol/L (ref 22–32)
Calcium: 8.8 mg/dL — ABNORMAL LOW (ref 8.9–10.3)
Chloride: 102 mmol/L (ref 98–111)
Creatinine: 0.41 mg/dL — ABNORMAL LOW (ref 0.44–1.00)
GFR, Estimated: >60 mL/min (ref >60)
Glucose, Bld: 88 mg/dL (ref 70–99)
Potassium: 2.7 mmol/L — CL (ref 3.5–5.1)
Sodium: 136 mmol/L (ref 135–145)
Total Bilirubin: 0.3 mg/dL (ref 0.3–1.2)
Total Protein: 7.1 g/dL (ref 6.5–8.1)

## 2022-06-09 LAB — IRON AND IRON BINDING CAPACITY (CC-WL,HP ONLY)
Iron: 19 ug/dL — ABNORMAL LOW (ref 28–170)
Saturation Ratios: 3 % — ABNORMAL LOW (ref 10.4–31.8)
TIBC: 567 ug/dL — ABNORMAL HIGH (ref 250–450)
UIBC: 548 ug/dL

## 2022-06-09 LAB — FERRITIN: Ferritin: 7 ng/mL — ABNORMAL LOW (ref 11–307)

## 2022-06-09 MED ORDER — POTASSIUM CHLORIDE CRYS ER 20 MEQ PO TBCR: 20.0000 meq | EXTENDED_RELEASE_TABLET | Freq: Two times a day (BID) | ORAL | 0 refills | Status: DC

## 2022-06-09 NOTE — Telephone Encounter (Signed)
T/C to pt to advise of a critical lab of K+ 2.7.  RX sent to pharmacy and lab appt scheduled for 06/12/22 at 11:00  Pt voiced understanding and confirmed lab appt.

## 2022-06-09 NOTE — Progress Notes (Signed)
CRITICAL VALUE STICKER  CRITICAL VALUE: K+ 2.7 mmol/L  RECEIVER (on-site recipient of call): Arna Snipe RN  Hansen NOTIFIED: 06/09/2022  1538  MESSENGER (representative from lab): Pam  MD NOTIFIED: Rondel Baton LPN/ Dede Query LPN  TIME OF NOTIFICATION: [id number]  RESPONSE:  Acknowledged information

## 2022-06-11 ENCOUNTER — Encounter: Payer: Self-pay | Admitting: Hematology

## 2022-06-11 NOTE — Progress Notes (Signed)
HEMATOLOGY/ONCOLOGY CLINIC NOTE  Date of Service:06/09/2022  Patient Care Team: Bartholome Bill, MD as PCP - General (Family Medicine) Hands, Kylie, NP as PCP - OBGYN (Obstetrics and Gynecology)  CHIEF COMPLAINTS/PURPOSE OF CONSULTATION:  Follow-up for iron deficiency anemia  TREATMENT: Periodic IV iron infusion, last treatment IV venofer 300 mg x 3 doses from 03/02/21-03/23/21.   INTERVAL HISTORY:  Monica Green is a wonderful 36 y.o. female who is here for f/u of iron deficiency anemia in pregnancy in the background of ulcerative colitis. She is unaccompanied for this visit.   Monica Green is [redacted] weeks pregnant with twins. She has noticed progressive fatigue throughout the pregnancy. She does have shortness of breath with exertion. She does crave ice throughout the day. She reports have one flare of UC with diarrhea recently. Otherwise her bowel movements are normal. She denies easy bruising or signs of bleeding including hematochezia or melena. She denies fevers, chills, sweats, chest pain or cough. She has no other complaints. Rest of the 10 point ROS is below.    MEDICAL HISTORY:  Past Medical History:  Diagnosis Date   Anemia    Colitis    IDA (iron deficiency anemia)    Pre-eclampsia    Ulcerative colitis ([place])     SURGICAL HISTORY: Past Surgical History:  Procedure Laterality Date   FLEXIBLE SIGMOIDOSCOPY N/A 03/23/2015   Procedure: FLEXIBLE SIGMOIDOSCOPY;  Surgeon: Laurence Spates, MD;  Location: [facility] ENDOSCOPY;  Service: Endoscopy;  Laterality: N/A;    SOCIAL HISTORY: Social History   Socioeconomic History   Marital status: Single    Spouse name: Not on file   Number of children: 1   Years of education: Not on file   Highest education level: Not on file  Occupational History   Not on file  Tobacco Use   Smoking status: Never   Smokeless tobacco: Never  Vaping Use   Vaping Use: Never used  Substance and Sexual Activity   Alcohol use:  Not Currently    Comment: Social   Drug use: No   Sexual activity: Yes  Other Topics Concern   Not on file  Social History Narrative   Not on file   Social Determinants of Health   Financial Resource Strain: Not on file  Food Insecurity: Not on file  Transportation Needs: Not on file  Physical Activity: Not on file  Stress: Not on file  Social Connections: Not on file  Intimate Partner Violence: Not on file    FAMILY HISTORY: Family History  Problem Relation Age of Onset   Colon cancer Father     ALLERGIES:  has No Known Allergies.  MEDICATIONS:  Current Outpatient Medications  Medication Sig Dispense Refill   ferrous sulfate 325 (65 FE) MG tablet Take 325 mg by mouth daily with breakfast.     potassium chloride SA (KLOR-CON M) 20 MEQ tablet Take 1 tablet (20 mEq total) by mouth 2 (two) times daily. 14 tablet 0   Prenatal Vit-Fe Fumarate-FA (PRENATAL MULTIVITAMIN) TABS tablet Take 1 tablet by mouth daily at 12 noon. 60 tablet 3   VITAMIN D3 50 MCG (2000 UT) capsule Take 2 capsules by mouth daily.     mesalamine (LIALDA) 1.2 g EC tablet Take 4 tablets (4.8 g total) by mouth daily with breakfast. (Patient not taking: Reported on 05/26/2022) 180 tablet 1   No current facility-administered medications for this visit.    REVIEW OF SYSTEMS:   Constitutional: Negative for appetite change, chills, fever  and unexpected weight change +fatigue HENT: Negative for mouth sores, nosebleeds, sore throat and trouble swallowing.   Eyes: Negative for eye problems and icterus.  Respiratory: Negative for cough, hemoptysis,and wheezing.  +shortness of breath.  Cardiovascular: Negative for chest pain and leg swelling.  Gastrointestinal: Negative for abdominal pain, constipation,nausea and vomiting. +diarrhea Genitourinary: Negative for bladder incontinence, difficulty urinating, dysuria, frequency and hematuria. Musculoskeletal: Negative for back pain, gait problem, neck pain and neck  stiffness.  Skin:Negative for rash and ulcers Neurological: Negative for dizziness, extremity weakness, gait problem, headaches, light-headedness and seizures.  Hematological: Negative for adenopathy. Does not bruise/bleed easily.  Psychiatric/Behavioral: Negative for confusion, depression and sleep disturbance. The patient is not nervous/anxious.     PHYSICAL EXAMINATION: ECOG PERFORMANCE STATUS: 2 - Symptomatic, <50% confined to bed  . Vitals:   06/09/22 1500  BP: (!) 106/58  Pulse: (!) 105  Resp: 16  Temp: (!) 97.5 F (36.4 C)  SpO2: 100%    Filed Weights   06/09/22 1500  Weight: 151 lb 11.2 oz (68.8 kg)   . GENERAL:alert, in no acute distress and comfortable SKIN: no acute rashes, no significant lesions EYES: conjunctiva are pink and non-injected, sclera anicteric OROPHARYNX: MMM, no exudates, no oropharyngeal erythema or ulceration LUNGS: clear to auscultation b/l with normal respiratory effort HEART: regular rate & rhythm ABDOMEN:  normoactive bowel sounds.  Extremity: no pedal edema PSYCH: alert & oriented x 3 with fluent speech NEURO: no focal motor/sensory deficits   LABORATORY DATA:  I have reviewed the data as listed  .    Latest Ref Rng & Units 06/09/2022    2:35 PM 07/21/2021    2:16 PM 02/08/2021    1:45 PM  CBC  WBC 4.0 - 10.5 K/uL 7.1  7.1  5.3   Hemoglobin 12.0 - 15.0 g/dL 8.3  13.1  10.8   Hematocrit 36.0 - 46.0 % 27.1  40.1  33.4   Platelets 150 - 400 K/uL 383  452.0  389     .    Latest Ref Rng & Units 06/09/2022    2:35 PM 07/21/2021    2:16 PM 02/08/2021    1:45 PM  CMP  Glucose 70 - 99 mg/dL 88  91  108   BUN 6 - 20 mg/dL '6  11  7   '$ Creatinine 0.44 - 1.00 mg/dL 0.41  0.92  0.70   Sodium 135 - 145 mmol/L 136  139  137   Potassium 3.5 - 5.1 mmol/L 2.7  4.0  3.3   Chloride 98 - 111 mmol/L 102  104  108   CO2 22 - 32 mmol/L '26  29  22   '$ Calcium 8.9 - 10.3 mg/dL 8.8  9.5  8.6   Total Protein 6.5 - 8.1 g/dL 7.1   7.4   Total Bilirubin 0.3  - 1.2 mg/dL 0.3   0.3   Alkaline Phos 38 - 126 U/L 81   52   AST 15 - 41 U/L 12   11   ALT 0 - 44 U/L 9   10    . Lab Results  Component Value Date   IRON 19 (L) 06/09/2022   TIBC 567 (H) 06/09/2022   IRONPCTSAT 3 (L) 06/09/2022   (Iron and TIBC)  Lab Results  Component Value Date   FERRITIN 7 (L) 06/09/2022     RADIOGRAPHIC STUDIES: I have personally reviewed the radiological images as listed and agreed with the findings in the report.  ASSESSMENT &  PLAN:  Monica Green is a 37 y.o. female who returns for a follow up for iron deficiency anemia and B12 deficiency.  #1  Iron deficiency anemia due to recent pregnancy and GI losses due to ulcerative colitis --Receives periodic IV iron infusions. Last received IV venofer 300 mg x 3 doses from 03/02/21-03/23/21. --Currently [redacted] weeks gestation with twins.  --Labs from today reviewed which shows worsening anemia with 8.3, MCV 74.2. Iron panel confirmed deficiency with serum iron 19, TIBC 567, saturation 3%, ferritin 7.  --Recommend IV feraheme 510 mg x 2 doses. We will arrange at day hospital with fetal monitoring.   #2 Vitamin B12 deficiency -Likely due to poor absorption from ulcerative colitis and from increased demand during pregnancy. -We will repeat vitamin B12 levels are next visit  #3 Hypokalema: -Potassium level is 2.7 today -Gave 2 week prescription for potassium chloride 20 mEq twice daily  FOLLOW UP: IV feraheme 510 mg x 2 doses RTC in 6 weeks with labs    All of the patients questions were answered with apparent satisfaction. The patient knows to call the clinic with any problems, questions or concerns.  I have spent a total of 30 minutes minutes of face-to-face and non-face-to-face time, preparing to see the patient, performing a medically appropriate examination, counseling and educating the patient, ordering medications/tests, documenting clinical information in the electronic health record,  and care  coordination.   Dede Query PA-C Dept of Hematology and [place] at [facility] Phone: [phone]

## 2022-06-12 ENCOUNTER — Inpatient Hospital Stay: Payer: Medicaid Other

## 2022-06-12 ENCOUNTER — Other Ambulatory Visit: Payer: Self-pay

## 2022-06-12 ENCOUNTER — Telehealth: Payer: Self-pay

## 2022-06-12 DIAGNOSIS — E876 Hypokalemia: Secondary | ICD-10-CM

## 2022-06-12 DIAGNOSIS — O99012 Anemia complicating pregnancy, second trimester: Secondary | ICD-10-CM | POA: Diagnosis not present

## 2022-06-12 LAB — CMP (CANCER CENTER ONLY)
ALT: 8 U/L (ref 0–44)
AST: 10 U/L — ABNORMAL LOW (ref 15–41)
Albumin: 3 g/dL — ABNORMAL LOW (ref 3.5–5.0)
Alkaline Phosphatase: 74 U/L (ref 38–126)
Anion gap: 7 (ref 5–15)
BUN: 5 mg/dL — ABNORMAL LOW (ref 6–20)
CO2: 23 mmol/L (ref 22–32)
Calcium: 8.2 mg/dL — ABNORMAL LOW (ref 8.9–10.3)
Chloride: 106 mmol/L (ref 98–111)
Creatinine: 0.4 mg/dL — ABNORMAL LOW (ref 0.44–1.00)
GFR, Estimated: >60 mL/min (ref >60)
Glucose, Bld: 89 mg/dL (ref 70–99)
Potassium: 3.6 mmol/L (ref 3.5–5.1)
Sodium: 136 mmol/L (ref 135–145)
Total Bilirubin: 0.3 mg/dL (ref 0.3–1.2)
Total Protein: 6.6 g/dL (ref 6.5–8.1)

## 2022-06-12 NOTE — Telephone Encounter (Signed)
T/C to pt to advise of IV iron appt 3/14 at 9:00 and lab result of K+ 3.6.  Pt  is to finish out the week of taking potassium once a day.  Pt with VU

## 2022-06-15 ENCOUNTER — Encounter (HOSPITAL_COMMUNITY)
Admission: RE | Admit: 2022-06-15 | Discharge: 2022-06-15 | Disposition: A | Discharge status: Home / Self Care | Payer: Medicaid Other | Source: Ambulatory Visit | Attending: Physician Assistant | Admitting: Physician Assistant

## 2022-06-15 DIAGNOSIS — O99012 Anemia complicating pregnancy, second trimester: Secondary | ICD-10-CM | POA: Insufficient documentation

## 2022-06-15 MED ORDER — SODIUM CHLORIDE 0.9 % IV SOLN
510.0000 mg | INTRAVENOUS | Status: DC
  Administered 2022-06-15: 510 mg via INTRAVENOUS
  Filled 2022-06-15: qty 510

## 2022-06-16 ENCOUNTER — Other Ambulatory Visit: Payer: Medicaid Other

## 2022-06-16 ENCOUNTER — Ambulatory Visit: Payer: Medicaid Other | Admitting: *Deleted

## 2022-06-16 ENCOUNTER — Ambulatory Visit: Payer: Medicaid Other | Attending: Maternal & Fetal Medicine

## 2022-06-16 ENCOUNTER — Encounter: Payer: Self-pay | Admitting: Maternal & Fetal Medicine

## 2022-06-16 VITALS — BP 110/59 | HR 90

## 2022-06-16 DIAGNOSIS — O30042 Twin pregnancy, dichorionic/diamniotic, second trimester: Secondary | ICD-10-CM

## 2022-06-16 DIAGNOSIS — O35BXX0 Maternal care for other (suspected) fetal abnormality and damage, fetal cardiac anomalies, not applicable or unspecified: Secondary | ICD-10-CM

## 2022-06-16 DIAGNOSIS — D75839 Thrombocytosis, unspecified: Secondary | ICD-10-CM

## 2022-06-16 DIAGNOSIS — O99012 Anemia complicating pregnancy, second trimester: Secondary | ICD-10-CM

## 2022-06-16 DIAGNOSIS — O365922 Maternal care for other known or suspected poor fetal growth, second trimester, fetus 2: Secondary | ICD-10-CM

## 2022-06-16 DIAGNOSIS — O09522 Supervision of elderly multigravida, second trimester: Secondary | ICD-10-CM

## 2022-06-16 DIAGNOSIS — D649 Anemia, unspecified: Secondary | ICD-10-CM

## 2022-06-16 DIAGNOSIS — O09292 Supervision of pregnancy with other poor reproductive or obstetric history, second trimester: Secondary | ICD-10-CM

## 2022-06-16 DIAGNOSIS — O365990 Maternal care for other known or suspected poor fetal growth, unspecified trimester, not applicable or unspecified: Secondary | ICD-10-CM

## 2022-06-16 DIAGNOSIS — O09299 Supervision of pregnancy with other poor reproductive or obstetric history, unspecified trimester: Secondary | ICD-10-CM | POA: Diagnosis present

## 2022-06-16 DIAGNOSIS — O3513X2 Maternal care for (suspected) chromosomal abnormality in fetus, trisomy 21, fetus 2: Secondary | ICD-10-CM

## 2022-06-16 DIAGNOSIS — O35BXX1 Maternal care for other (suspected) fetal abnormality and damage, fetal cardiac anomalies, fetus 1: Secondary | ICD-10-CM | POA: Diagnosis not present

## 2022-06-16 DIAGNOSIS — O35BXX2 Maternal care for other (suspected) fetal abnormality and damage, fetal cardiac anomalies, fetus 2: Secondary | ICD-10-CM

## 2022-06-16 DIAGNOSIS — O35AXX2 Maternal care for other (suspected) fetal abnormality and damage, fetal facial anomalies, fetus 2: Secondary | ICD-10-CM

## 2022-06-16 DIAGNOSIS — O280 Abnormal hematological finding on antenatal screening of mother: Secondary | ICD-10-CM

## 2022-06-16 DIAGNOSIS — O35EXX2 Maternal care for other (suspected) fetal abnormality and damage, fetal genitourinary anomalies, fetus 2: Secondary | ICD-10-CM | POA: Diagnosis not present

## 2022-06-16 DIAGNOSIS — O3513X1 Maternal care for (suspected) chromosomal abnormality in fetus, trisomy 21, fetus 1: Secondary | ICD-10-CM

## 2022-06-16 DIAGNOSIS — Z3A26 26 weeks gestation of pregnancy: Secondary | ICD-10-CM

## 2022-06-16 DIAGNOSIS — O43192 Other malformation of placenta, second trimester: Secondary | ICD-10-CM

## 2022-06-16 DIAGNOSIS — O365921 Maternal care for other known or suspected poor fetal growth, second trimester, fetus 1: Secondary | ICD-10-CM

## 2022-06-16 DIAGNOSIS — O99112 Other diseases of the blood and blood-forming organs and certain disorders involving the immune mechanism complicating pregnancy, second trimester: Secondary | ICD-10-CM

## 2022-06-22 ENCOUNTER — Ambulatory Visit: Payer: Medicaid Other | Attending: Obstetrics | Admitting: Obstetrics

## 2022-06-22 ENCOUNTER — Ambulatory Visit: Payer: Medicaid Other | Attending: Maternal & Fetal Medicine

## 2022-06-22 ENCOUNTER — Ambulatory Visit: Payer: Medicaid Other | Admitting: *Deleted

## 2022-06-22 VITALS — BP 105/62 | HR 84

## 2022-06-22 DIAGNOSIS — O365921 Maternal care for other known or suspected poor fetal growth, second trimester, fetus 1: Secondary | ICD-10-CM

## 2022-06-22 DIAGNOSIS — O3513X2 Maternal care for (suspected) chromosomal abnormality in fetus, trisomy 21, fetus 2: Secondary | ICD-10-CM

## 2022-06-22 DIAGNOSIS — O35EXX2 Maternal care for other (suspected) fetal abnormality and damage, fetal genitourinary anomalies, fetus 2: Secondary | ICD-10-CM

## 2022-06-22 DIAGNOSIS — O35AXX2 Maternal care for other (suspected) fetal abnormality and damage, fetal facial anomalies, fetus 2: Secondary | ICD-10-CM

## 2022-06-22 DIAGNOSIS — O402XX2 Polyhydramnios, second trimester, fetus 2: Secondary | ICD-10-CM

## 2022-06-22 DIAGNOSIS — O99012 Anemia complicating pregnancy, second trimester: Secondary | ICD-10-CM | POA: Diagnosis not present

## 2022-06-22 DIAGNOSIS — O35BXX2 Maternal care for other (suspected) fetal abnormality and damage, fetal cardiac anomalies, fetus 2: Secondary | ICD-10-CM

## 2022-06-22 DIAGNOSIS — O43192 Other malformation of placenta, second trimester: Secondary | ICD-10-CM

## 2022-06-22 DIAGNOSIS — Q212 Atrioventricular septal defect, unspecified as to partial or complete: Secondary | ICD-10-CM

## 2022-06-22 DIAGNOSIS — O30042 Twin pregnancy, dichorionic/diamniotic, second trimester: Secondary | ICD-10-CM

## 2022-06-22 DIAGNOSIS — O365922 Maternal care for other known or suspected poor fetal growth, second trimester, fetus 2: Secondary | ICD-10-CM | POA: Diagnosis not present

## 2022-06-22 DIAGNOSIS — O09299 Supervision of pregnancy with other poor reproductive or obstetric history, unspecified trimester: Secondary | ICD-10-CM | POA: Diagnosis present

## 2022-06-22 DIAGNOSIS — O35BXX1 Maternal care for other (suspected) fetal abnormality and damage, fetal cardiac anomalies, fetus 1: Secondary | ICD-10-CM | POA: Diagnosis not present

## 2022-06-22 DIAGNOSIS — Z3A27 27 weeks gestation of pregnancy: Secondary | ICD-10-CM

## 2022-06-22 DIAGNOSIS — D649 Anemia, unspecified: Secondary | ICD-10-CM

## 2022-06-22 DIAGNOSIS — O365990 Maternal care for other known or suspected poor fetal growth, unspecified trimester, not applicable or unspecified: Secondary | ICD-10-CM

## 2022-06-22 DIAGNOSIS — O280 Abnormal hematological finding on antenatal screening of mother: Secondary | ICD-10-CM | POA: Diagnosis present

## 2022-06-22 DIAGNOSIS — O35BXX0 Maternal care for other (suspected) fetal abnormality and damage, fetal cardiac anomalies, not applicable or unspecified: Secondary | ICD-10-CM

## 2022-06-22 DIAGNOSIS — O09522 Supervision of elderly multigravida, second trimester: Secondary | ICD-10-CM

## 2022-06-22 DIAGNOSIS — O99112 Other diseases of the blood and blood-forming organs and certain disorders involving the immune mechanism complicating pregnancy, second trimester: Secondary | ICD-10-CM

## 2022-06-22 DIAGNOSIS — O09292 Supervision of pregnancy with other poor reproductive or obstetric history, second trimester: Secondary | ICD-10-CM

## 2022-06-22 DIAGNOSIS — D75839 Thrombocytosis, unspecified: Secondary | ICD-10-CM

## 2022-06-22 NOTE — Progress Notes (Signed)
MFM Note  Monica Green was seen due to a dichorionic, diamniotic twin gestation with IUGR of both fetuses. She denies any problems since her last exam.  She reports feeling vigorous fetal movements of both fetuses throughout the day.  She had a fetal echocardiogram performed with [facility] pediatric cardiology where an AV canal defect was noted in twin B.  Doppler studies of the umbilical arteries performed due to fetal growth restriction showed continued normal flow in both twin A and twin B.  There were no signs of absent or reversed end-diastolic flow noted today in either fetus.  There was normal amniotic fluid noted around twin A.  Polyhydramnios is noted in twin B. Vigorous fetal movements of both fetuses were noted throughout today's exam.  Mild ventriculomegaly, bilateral pyelectasis, and the AV canal defect continues to be noted in twin B.    She was advised that based on these findings and her positive cell free DNA test for Down syndrome, it is highly likely that twin B has Down syndrome.  She was offered and declined an amniocentesis today for definitive diagnosis.  Due to IUGR, we will continue to follow her with weekly fetal testing and umbilical artery Doppler studies.  She will return in 1 week for an NST and umbilical artery Doppler study.     The patient stated that all of her questions were answered today.  A total of 20 minutes was spent counseling and coordinating the care for this patient.  Greater than 50% of the time was spent in direct face-to-face contact.

## 2022-06-26 ENCOUNTER — Encounter (HOSPITAL_COMMUNITY)
Admission: RE | Admit: 2022-06-26 | Discharge: 2022-06-26 | Disposition: A | Discharge status: Home / Self Care | Payer: Medicaid Other | Source: Ambulatory Visit | Attending: Physician Assistant | Admitting: Physician Assistant

## 2022-06-26 DIAGNOSIS — O99012 Anemia complicating pregnancy, second trimester: Secondary | ICD-10-CM | POA: Diagnosis not present

## 2022-06-26 MED ORDER — SODIUM CHLORIDE 0.9 % IV SOLN
510.0000 mg | INTRAVENOUS | Status: DC
  Administered 2022-06-26: 510 mg via INTRAVENOUS
  Filled 2022-06-26: qty 510

## 2022-06-28 ENCOUNTER — Ambulatory Visit: Payer: Medicaid Other | Attending: Maternal & Fetal Medicine

## 2022-06-28 ENCOUNTER — Ambulatory Visit (HOSPITAL_BASED_OUTPATIENT_CLINIC_OR_DEPARTMENT_OTHER): Payer: Medicaid Other | Admitting: *Deleted

## 2022-06-28 ENCOUNTER — Ambulatory Visit: Payer: Medicaid Other | Admitting: *Deleted

## 2022-06-28 ENCOUNTER — Other Ambulatory Visit: Payer: Self-pay | Admitting: *Deleted

## 2022-06-28 VITALS — BP 108/60 | HR 103

## 2022-06-28 DIAGNOSIS — Q212 Atrioventricular septal defect, unspecified as to partial or complete: Secondary | ICD-10-CM | POA: Diagnosis present

## 2022-06-28 DIAGNOSIS — O35EXX2 Maternal care for other (suspected) fetal abnormality and damage, fetal genitourinary anomalies, fetus 2: Secondary | ICD-10-CM

## 2022-06-28 DIAGNOSIS — O35BXX1 Maternal care for other (suspected) fetal abnormality and damage, fetal cardiac anomalies, fetus 1: Secondary | ICD-10-CM | POA: Diagnosis not present

## 2022-06-28 DIAGNOSIS — O09523 Supervision of elderly multigravida, third trimester: Secondary | ICD-10-CM

## 2022-06-28 DIAGNOSIS — O365932 Maternal care for other known or suspected poor fetal growth, third trimester, fetus 2: Secondary | ICD-10-CM

## 2022-06-28 DIAGNOSIS — O283 Abnormal ultrasonic finding on antenatal screening of mother: Secondary | ICD-10-CM

## 2022-06-28 DIAGNOSIS — O35AXX2 Maternal care for other (suspected) fetal abnormality and damage, fetal facial anomalies, fetus 2: Secondary | ICD-10-CM

## 2022-06-28 DIAGNOSIS — O30043 Twin pregnancy, dichorionic/diamniotic, third trimester: Secondary | ICD-10-CM | POA: Diagnosis present

## 2022-06-28 DIAGNOSIS — O3513X2 Maternal care for (suspected) chromosomal abnormality in fetus, trisomy 21, fetus 2: Secondary | ICD-10-CM | POA: Diagnosis present

## 2022-06-28 DIAGNOSIS — O30042 Twin pregnancy, dichorionic/diamniotic, second trimester: Secondary | ICD-10-CM | POA: Insufficient documentation

## 2022-06-28 DIAGNOSIS — D649 Anemia, unspecified: Secondary | ICD-10-CM

## 2022-06-28 DIAGNOSIS — D75839 Thrombocytosis, unspecified: Secondary | ICD-10-CM

## 2022-06-28 DIAGNOSIS — O365931 Maternal care for other known or suspected poor fetal growth, third trimester, fetus 1: Secondary | ICD-10-CM

## 2022-06-28 DIAGNOSIS — O09293 Supervision of pregnancy with other poor reproductive or obstetric history, third trimester: Secondary | ICD-10-CM

## 2022-06-28 DIAGNOSIS — O365990 Maternal care for other known or suspected poor fetal growth, unspecified trimester, not applicable or unspecified: Secondary | ICD-10-CM | POA: Diagnosis present

## 2022-06-28 DIAGNOSIS — O35BXX2 Maternal care for other (suspected) fetal abnormality and damage, fetal cardiac anomalies, fetus 2: Secondary | ICD-10-CM

## 2022-06-28 DIAGNOSIS — O43193 Other malformation of placenta, third trimester: Secondary | ICD-10-CM | POA: Diagnosis present

## 2022-06-28 DIAGNOSIS — Z3A28 28 weeks gestation of pregnancy: Secondary | ICD-10-CM

## 2022-06-28 DIAGNOSIS — O3513X0 Maternal care for (suspected) chromosomal abnormality in fetus, trisomy 21, not applicable or unspecified: Secondary | ICD-10-CM | POA: Diagnosis not present

## 2022-06-28 DIAGNOSIS — O99113 Other diseases of the blood and blood-forming organs and certain disorders involving the immune mechanism complicating pregnancy, third trimester: Secondary | ICD-10-CM

## 2022-06-28 DIAGNOSIS — O99013 Anemia complicating pregnancy, third trimester: Secondary | ICD-10-CM

## 2022-06-28 NOTE — Procedures (Signed)
JAZIYA VANDEPOL 07/02/1985 [redacted]w[redacted]d   Fetus B Non-Stress Test Interpretation for 06/28/22  Indication: IUGR and VSD  Fetal Heart Rate Fetus B Mode: External Baseline Rate (B): 130 BPM Variability: Moderate Accelerations: 10 x 10 Decelerations: None  Uterine Activity Mode: Toco Contraction Frequency (min): 1 u/c during NST Contraction Duration (sec): 50 Contraction Quality: Mild Resting Tone Palpated: Relaxed     LATOSHA MIEDEMA May 28, 1985 [redacted]w[redacted]d  Fetus A Non-Stress Test Interpretation for 06/28/22  Indication: IUGR  Fetal Heart Rate A Mode: External Baseline Rate (A): 140 bpm Variability: Moderate Accelerations: 10 x 10 Decelerations: None Multiple birth?: Yes  Uterine Activity Mode: Toco Contraction Frequency (min): 1 u/c during NST Contraction Duration (sec): 50 Contraction Quality: Mild Resting Tone Palpated: Relaxed  Interpretation (Fetal Testing) Nonstress Test Interpretation: Reactive Overall Impression: Reassuring for gestational age Comments: tracing reviewed by Dr. Donalee Citrin

## 2022-07-05 ENCOUNTER — Ambulatory Visit: Payer: Medicaid Other | Admitting: *Deleted

## 2022-07-05 ENCOUNTER — Ambulatory Visit (HOSPITAL_BASED_OUTPATIENT_CLINIC_OR_DEPARTMENT_OTHER): Payer: Medicaid Other | Admitting: *Deleted

## 2022-07-05 ENCOUNTER — Ambulatory Visit: Payer: Medicaid Other | Attending: Maternal & Fetal Medicine

## 2022-07-05 VITALS — BP 88/60 | HR 122

## 2022-07-05 DIAGNOSIS — O99013 Anemia complicating pregnancy, third trimester: Secondary | ICD-10-CM

## 2022-07-05 DIAGNOSIS — O365932 Maternal care for other known or suspected poor fetal growth, third trimester, fetus 2: Secondary | ICD-10-CM | POA: Diagnosis not present

## 2022-07-05 DIAGNOSIS — O99113 Other diseases of the blood and blood-forming organs and certain disorders involving the immune mechanism complicating pregnancy, third trimester: Secondary | ICD-10-CM

## 2022-07-05 DIAGNOSIS — O3513X2 Maternal care for (suspected) chromosomal abnormality in fetus, trisomy 21, fetus 2: Secondary | ICD-10-CM | POA: Diagnosis present

## 2022-07-05 DIAGNOSIS — Z3A29 29 weeks gestation of pregnancy: Secondary | ICD-10-CM | POA: Insufficient documentation

## 2022-07-05 DIAGNOSIS — D75839 Thrombocytosis, unspecified: Secondary | ICD-10-CM

## 2022-07-05 DIAGNOSIS — O365931 Maternal care for other known or suspected poor fetal growth, third trimester, fetus 1: Secondary | ICD-10-CM | POA: Diagnosis not present

## 2022-07-05 DIAGNOSIS — O365990 Maternal care for other known or suspected poor fetal growth, unspecified trimester, not applicable or unspecified: Secondary | ICD-10-CM | POA: Diagnosis present

## 2022-07-05 DIAGNOSIS — O35EXX2 Maternal care for other (suspected) fetal abnormality and damage, fetal genitourinary anomalies, fetus 2: Secondary | ICD-10-CM

## 2022-07-05 DIAGNOSIS — D649 Anemia, unspecified: Secondary | ICD-10-CM

## 2022-07-05 DIAGNOSIS — O35BXX1 Maternal care for other (suspected) fetal abnormality and damage, fetal cardiac anomalies, fetus 1: Secondary | ICD-10-CM

## 2022-07-05 DIAGNOSIS — O30043 Twin pregnancy, dichorionic/diamniotic, third trimester: Secondary | ICD-10-CM

## 2022-07-05 DIAGNOSIS — O35AXX2 Maternal care for other (suspected) fetal abnormality and damage, fetal facial anomalies, fetus 2: Secondary | ICD-10-CM

## 2022-07-05 DIAGNOSIS — Q212 Atrioventricular septal defect, unspecified as to partial or complete: Secondary | ICD-10-CM | POA: Diagnosis present

## 2022-07-05 DIAGNOSIS — O09523 Supervision of elderly multigravida, third trimester: Secondary | ICD-10-CM

## 2022-07-05 DIAGNOSIS — O35BXX2 Maternal care for other (suspected) fetal abnormality and damage, fetal cardiac anomalies, fetus 2: Secondary | ICD-10-CM | POA: Diagnosis not present

## 2022-07-05 DIAGNOSIS — O43193 Other malformation of placenta, third trimester: Secondary | ICD-10-CM

## 2022-07-05 DIAGNOSIS — O30042 Twin pregnancy, dichorionic/diamniotic, second trimester: Secondary | ICD-10-CM | POA: Diagnosis present

## 2022-07-05 DIAGNOSIS — O3513X0 Maternal care for (suspected) chromosomal abnormality in fetus, trisomy 21, not applicable or unspecified: Secondary | ICD-10-CM | POA: Diagnosis not present

## 2022-07-05 DIAGNOSIS — O09293 Supervision of pregnancy with other poor reproductive or obstetric history, third trimester: Secondary | ICD-10-CM

## 2022-07-05 NOTE — Procedures (Signed)
AYMAR SHVARTS [birth year] [redacted]w[redacted]d   Fetus B Non-Stress Test Interpretation for 07/05/22  Indication: IUGR and Advanced Maternal Age >40 years  Fetal Heart Rate Fetus B Mode: External Baseline Rate (B): 140 BPM Variability: Moderate Accelerations: 10 x 10 Decelerations: Variable  Uterine Activity Mode: Toco Contraction Frequency (min): 2-11 Contraction Duration (sec): 40 with UI Contraction Quality: Mild Resting Tone Palpated: Relaxed  Interpretation (Baby B - Fetal Testing) Nonstress Test Interpretation (Baby B): Reactive Overall Impression (Baby B): Reassuring for gestational age Comments (Baby B): tracing reviewed byDr. Erling Cruz 07-13-1985 [redacted]w[redacted]d  Fetus A Non-Stress Test Interpretation for 07/05/22  Indication: IUGR and Advanced Maternal Age >40 years  Fetal Heart Rate A Mode: External Baseline Rate (A): 150 bpm Variability: Moderate Accelerations: 10 x 10 Decelerations: Variable Multiple birth?: Yes  Uterine Activity Mode: Toco Contraction Frequency (min): 2-11 Contraction Duration (sec): 40 with UI Contraction Quality: Mild Resting Tone Palpated: Relaxed  Interpretation (Fetal Testing) Nonstress Test Interpretation: Reactive Overall Impression: Reassuring for gestational age Comments: tracing reviewed byDr. Donalee Citrin

## 2022-07-13 ENCOUNTER — Ambulatory Visit: Payer: Medicaid Other | Admitting: *Deleted

## 2022-07-13 ENCOUNTER — Ambulatory Visit: Payer: Medicaid Other

## 2022-07-13 ENCOUNTER — Ambulatory Visit: Payer: Medicaid Other | Attending: Obstetrics and Gynecology | Admitting: *Deleted

## 2022-07-13 VITALS — BP 102/62 | HR 92

## 2022-07-13 DIAGNOSIS — O30043 Twin pregnancy, dichorionic/diamniotic, third trimester: Secondary | ICD-10-CM

## 2022-07-13 DIAGNOSIS — O365932 Maternal care for other known or suspected poor fetal growth, third trimester, fetus 2: Secondary | ICD-10-CM | POA: Insufficient documentation

## 2022-07-13 DIAGNOSIS — O09523 Supervision of elderly multigravida, third trimester: Secondary | ICD-10-CM | POA: Diagnosis not present

## 2022-07-13 DIAGNOSIS — Z3A30 30 weeks gestation of pregnancy: Secondary | ICD-10-CM

## 2022-07-13 DIAGNOSIS — O365931 Maternal care for other known or suspected poor fetal growth, third trimester, fetus 1: Secondary | ICD-10-CM | POA: Insufficient documentation

## 2022-07-13 NOTE — Procedures (Signed)
Monica Green 09/17/1985 [redacted]w[redacted]d   Fetus B Non-Stress Test Interpretation for 07/13/22  Indication: IUGR and Advanced Maternal Age >40 years  Fetal Heart Rate Fetus B Mode: External Baseline Rate (B): 135 BPM Variability: Moderate Accelerations: 10 x 10 Decelerations: None  Uterine Activity Mode: Toco Contraction Frequency (min): UI Resting Tone Palpated: Relaxed  Interpretation (Baby B - Fetal Testing) Nonstress Test Interpretation (Baby B): Reactive Overall Impression (Baby B): Reassuring for gestational age Comments (Baby B): tracing reviewed by Dr. Alyse Low 05-07-1985 [redacted]w[redacted]d  Fetus A Non-Stress Test Interpretation for 07/13/22  Indication: IUGR and Advanced Maternal Age >40 years  Fetal Heart Rate A Mode: External Baseline Rate (A): 150 bpm Variability: Moderate Accelerations: 10 x 10 Decelerations: None Multiple birth?: Yes  Uterine Activity Mode: Toco Contraction Frequency (min): UI Resting Tone Palpated: Relaxed  Interpretation (Fetal Testing) Nonstress Test Interpretation: Reactive Overall Impression: Reassuring for gestational age Comments: tracing reviewed by Dr. Judeth Cornfield

## 2022-07-19 ENCOUNTER — Ambulatory Visit (HOSPITAL_BASED_OUTPATIENT_CLINIC_OR_DEPARTMENT_OTHER): Payer: Medicaid Other | Admitting: *Deleted

## 2022-07-19 ENCOUNTER — Ambulatory Visit: Payer: Medicaid Other | Attending: Obstetrics and Gynecology

## 2022-07-19 ENCOUNTER — Ambulatory Visit: Payer: Medicaid Other | Admitting: *Deleted

## 2022-07-19 VITALS — BP 106/66 | HR 92

## 2022-07-19 DIAGNOSIS — O365932 Maternal care for other known or suspected poor fetal growth, third trimester, fetus 2: Secondary | ICD-10-CM | POA: Insufficient documentation

## 2022-07-19 DIAGNOSIS — Z3A31 31 weeks gestation of pregnancy: Secondary | ICD-10-CM | POA: Insufficient documentation

## 2022-07-19 DIAGNOSIS — O35AXX2 Maternal care for other (suspected) fetal abnormality and damage, fetal facial anomalies, fetus 2: Secondary | ICD-10-CM | POA: Diagnosis not present

## 2022-07-19 DIAGNOSIS — O09523 Supervision of elderly multigravida, third trimester: Secondary | ICD-10-CM

## 2022-07-19 DIAGNOSIS — O43193 Other malformation of placenta, third trimester: Secondary | ICD-10-CM | POA: Insufficient documentation

## 2022-07-19 DIAGNOSIS — O35EXX2 Maternal care for other (suspected) fetal abnormality and damage, fetal genitourinary anomalies, fetus 2: Secondary | ICD-10-CM

## 2022-07-19 DIAGNOSIS — O3513X0 Maternal care for (suspected) chromosomal abnormality in fetus, trisomy 21, not applicable or unspecified: Secondary | ICD-10-CM | POA: Diagnosis not present

## 2022-07-19 DIAGNOSIS — O30043 Twin pregnancy, dichorionic/diamniotic, third trimester: Secondary | ICD-10-CM | POA: Diagnosis not present

## 2022-07-19 DIAGNOSIS — O09293 Supervision of pregnancy with other poor reproductive or obstetric history, third trimester: Secondary | ICD-10-CM

## 2022-07-19 DIAGNOSIS — O365931 Maternal care for other known or suspected poor fetal growth, third trimester, fetus 1: Secondary | ICD-10-CM

## 2022-07-19 DIAGNOSIS — D649 Anemia, unspecified: Secondary | ICD-10-CM

## 2022-07-19 DIAGNOSIS — O99013 Anemia complicating pregnancy, third trimester: Secondary | ICD-10-CM

## 2022-07-19 DIAGNOSIS — O35BXX2 Maternal care for other (suspected) fetal abnormality and damage, fetal cardiac anomalies, fetus 2: Secondary | ICD-10-CM | POA: Diagnosis not present

## 2022-07-19 DIAGNOSIS — O99113 Other diseases of the blood and blood-forming organs and certain disorders involving the immune mechanism complicating pregnancy, third trimester: Secondary | ICD-10-CM

## 2022-07-19 DIAGNOSIS — O283 Abnormal ultrasonic finding on antenatal screening of mother: Secondary | ICD-10-CM | POA: Diagnosis present

## 2022-07-19 DIAGNOSIS — O35BXX1 Maternal care for other (suspected) fetal abnormality and damage, fetal cardiac anomalies, fetus 1: Secondary | ICD-10-CM | POA: Diagnosis not present

## 2022-07-19 DIAGNOSIS — D75839 Thrombocytosis, unspecified: Secondary | ICD-10-CM

## 2022-07-19 NOTE — Procedures (Signed)
Monica Green 1985-04-25 [redacted]w[redacted]d   Fetus B Non-Stress Test Interpretation for 07/19/22  Indication: IUGR  Fetal Heart Rate Fetus B Mode: External Baseline Rate (B): 130 BPM Variability: Moderate Accelerations: 10 x 10 Decelerations: None  Uterine Activity Mode: Toco Contraction Frequency (min): one u/c during NST Contraction Duration (sec): 120 Contraction Quality: Mild Resting Tone Palpated: Relaxed Resting Time: Adequate  Interpretation (Baby B - Fetal Testing) Nonstress Test Interpretation (Baby B): Reactive Overall Impression (Baby B): Reassuring for gestational age Comments (Baby B): tracing reviewed by Dr. Alyse Low 1985-04-16 [redacted]w[redacted]d  Fetus A Non-Stress Test Interpretation for 07/19/22  Indication: IUGR  Fetal Heart Rate A Mode: External Baseline Rate (A): 145 bpm Variability: Moderate Accelerations: 10 x 10 Decelerations: None Multiple birth?: Yes  Uterine Activity Mode: Toco Contraction Frequency (min): one u/c during NST Contraction Duration (sec): 120 Contraction Quality: Mild Resting Tone Palpated: Relaxed Resting Time: Adequate  Interpretation (Fetal Testing) Nonstress Test Interpretation: Reactive Overall Impression: Reassuring for gestational age Comments: tracing reviewed by Dr. Judeth Cornfield

## 2022-07-24 ENCOUNTER — Inpatient Hospital Stay: Payer: Medicaid Other | Attending: Maternal & Fetal Medicine

## 2022-07-24 ENCOUNTER — Other Ambulatory Visit: Payer: Self-pay

## 2022-07-24 ENCOUNTER — Inpatient Hospital Stay (HOSPITAL_BASED_OUTPATIENT_CLINIC_OR_DEPARTMENT_OTHER): Payer: Medicaid Other | Admitting: Hematology

## 2022-07-24 VITALS — BP 109/71 | HR 86 | Temp 97.9°F | Resp 20 | Wt 158.8 lb

## 2022-07-24 DIAGNOSIS — Z8719 Personal history of other diseases of the digestive system: Secondary | ICD-10-CM | POA: Diagnosis not present

## 2022-07-24 DIAGNOSIS — O26892 Other specified pregnancy related conditions, second trimester: Secondary | ICD-10-CM | POA: Diagnosis not present

## 2022-07-24 DIAGNOSIS — E876 Hypokalemia: Secondary | ICD-10-CM | POA: Diagnosis not present

## 2022-07-24 DIAGNOSIS — O99012 Anemia complicating pregnancy, second trimester: Secondary | ICD-10-CM | POA: Diagnosis not present

## 2022-07-24 DIAGNOSIS — Z800 Family history of malignant neoplasm of digestive organs: Secondary | ICD-10-CM | POA: Insufficient documentation

## 2022-07-24 DIAGNOSIS — Z825 Family history of asthma and other chronic lower respiratory diseases: Secondary | ICD-10-CM | POA: Insufficient documentation

## 2022-07-24 DIAGNOSIS — D509 Iron deficiency anemia, unspecified: Secondary | ICD-10-CM

## 2022-07-24 DIAGNOSIS — Z8249 Family history of ischemic heart disease and other diseases of the circulatory system: Secondary | ICD-10-CM | POA: Insufficient documentation

## 2022-07-24 DIAGNOSIS — E538 Deficiency of other specified B group vitamins: Secondary | ICD-10-CM

## 2022-07-24 DIAGNOSIS — O280 Abnormal hematological finding on antenatal screening of mother: Secondary | ICD-10-CM | POA: Insufficient documentation

## 2022-07-24 DIAGNOSIS — Z79899 Other long term (current) drug therapy: Secondary | ICD-10-CM | POA: Diagnosis not present

## 2022-07-24 DIAGNOSIS — Z809 Family history of malignant neoplasm, unspecified: Secondary | ICD-10-CM | POA: Insufficient documentation

## 2022-07-24 DIAGNOSIS — Z3A22 22 weeks gestation of pregnancy: Secondary | ICD-10-CM | POA: Insufficient documentation

## 2022-07-24 DIAGNOSIS — O09292 Supervision of pregnancy with other poor reproductive or obstetric history, second trimester: Secondary | ICD-10-CM | POA: Diagnosis not present

## 2022-07-24 DIAGNOSIS — R42 Dizziness and giddiness: Secondary | ICD-10-CM | POA: Diagnosis not present

## 2022-07-24 DIAGNOSIS — O30049 Twin pregnancy, dichorionic/diamniotic, unspecified trimester: Secondary | ICD-10-CM | POA: Insufficient documentation

## 2022-07-24 LAB — CBC WITH DIFFERENTIAL (CANCER CENTER ONLY)
Abs Immature Granulocytes: 0.04 10*3/uL (ref 0.00–0.07)
Basophils Absolute: 0 10*3/uL (ref 0.0–0.1)
Basophils Relative: 1 %
Eosinophils Absolute: 0.3 10*3/uL (ref 0.0–0.5)
Eosinophils Relative: 5 %
HCT: 31.6 % — ABNORMAL LOW (ref 36.0–46.0)
Hemoglobin: 9.7 g/dL — ABNORMAL LOW (ref 12.0–15.0)
Immature Granulocytes: 1 %
Lymphocytes Relative: 17 %
Lymphs Abs: 1 10*3/uL (ref 0.7–4.0)
MCH: 25.1 pg — ABNORMAL LOW (ref 26.0–34.0)
MCHC: 30.7 g/dL (ref 30.0–36.0)
MCV: 81.9 fL (ref 80.0–100.0)
Monocytes Absolute: 0.5 10*3/uL (ref 0.1–1.0)
Monocytes Relative: 9 %
Neutro Abs: 4.2 10*3/uL (ref 1.7–7.7)
Neutrophils Relative %: 67 %
Platelet Count: 290 10*3/uL (ref 150–400)
RBC: 3.86 MIL/uL — ABNORMAL LOW (ref 3.87–5.11)
RDW: 23.3 % — ABNORMAL HIGH (ref 11.5–15.5)
WBC Count: 6.1 10*3/uL (ref 4.0–10.5)
nRBC: 0 % (ref 0.0–0.2)

## 2022-07-24 LAB — CMP (CANCER CENTER ONLY)
ALT: 7 U/L (ref 0–44)
AST: 12 U/L — ABNORMAL LOW (ref 15–41)
Albumin: 3 g/dL — ABNORMAL LOW (ref 3.5–5.0)
Alkaline Phosphatase: 98 U/L (ref 38–126)
Anion gap: 6 (ref 5–15)
BUN: 5 mg/dL — ABNORMAL LOW (ref 6–20)
CO2: 24 mmol/L (ref 22–32)
Calcium: 8.7 mg/dL — ABNORMAL LOW (ref 8.9–10.3)
Chloride: 106 mmol/L (ref 98–111)
Creatinine: 0.44 mg/dL (ref 0.44–1.00)
GFR, Estimated: >60 mL/min (ref >60)
Glucose, Bld: 75 mg/dL (ref 70–99)
Potassium: 3.3 mmol/L — ABNORMAL LOW (ref 3.5–5.1)
Sodium: 136 mmol/L (ref 135–145)
Total Bilirubin: 0.2 mg/dL — ABNORMAL LOW (ref 0.3–1.2)
Total Protein: 6.7 g/dL (ref 6.5–8.1)

## 2022-07-24 LAB — FERRITIN: Ferritin: 14 ng/mL (ref 11–307)

## 2022-07-24 LAB — IRON AND IRON BINDING CAPACITY (CC-WL,HP ONLY)
Iron: 41 ug/dL (ref 28–170)
Saturation Ratios: 8 % — ABNORMAL LOW (ref 10.4–31.8)
TIBC: 536 ug/dL — ABNORMAL HIGH (ref 250–450)
UIBC: 495 ug/dL — ABNORMAL HIGH (ref 148–442)

## 2022-07-24 LAB — VITAMIN B12: Vitamin B-12: 147 pg/mL — ABNORMAL LOW (ref 180–914)

## 2022-07-24 NOTE — Progress Notes (Signed)
HEMATOLOGY/ONCOLOGY CLINIC NOTE  Date of Service: 07/24/22   Patient Care Team: Verlon Au, MD as PCP - General (Family Medicine) Hands, Jenel Lucks, NP as PCP - OBGYN (Obstetrics and Gynecology)  CHIEF COMPLAINTS/PURPOSE OF CONSULTATION:  Follow-up for iron deficiency anemia  HISTORY OF PRESENTING ILLNESS:   Monica Green is a wonderful 37 y.o. female who has been referred to [place] by Nigel Bridgeman, CNM at [facility] for evaluation and management of chronic severe anemia. The pt reports that she is doing well overall.  The pt reports that she is currently [redacted] weeks pregnant. She was seen previously by Dr. Pamelia Hoit in November 2021 in the hospital for severe anemia due to her ulcerative colitis. The pt notes that she had an allergic reaction to the third and last blood transfusion reactive due to eye itching and hives over her face. She experienced no issues tolerating the Feraheme she received in the past. The pt notes her ulcerative colitis has not been bothering her recently. The pt notes she sees a GI at [facility]. The pt notes that she has needed IV iron in the past. The pt notes no treatments for her ulcerative colitis that was diagnosed in 2017 due to feelings of drowsiness ad heaviness in the past. The pt has been monitoring her food intake. The pt notes that she has not been taking iron supplements in her prenatal, but denies any Iron supplementation by itself due to constipation. The pt notes she was experiencing heavy periods for full seven days in the past prior to getting pregnant. The pt notes that she has not been to her GI since becoming pregnant. The pt experiences regular bowel habits with no noticeable blood or diarrhea. The pt notes that she was recently given an antibiotic for a UTI. 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 The pt notes she has recently been experiencing ice cravings within the last two weeks.  The pt  notes no other medical issues outside of her ulcerative colitis or familial blood disorders. The pt notes only social use of alcohol and denies any smoking prior to pregnancy. The pt notes her paternal grandmother and mother have diverticulitis but notes no major medical issues that run within the family.  Lab results 07/12/2020 of CBC w/diff and CMP is as follows: all values are WNL except for Hgb of 6.6, RBC of 3.47, HCT of 23.6, MCV of 69, MCH of 19.0, MCHC of 27.7, RDW of 18.1. 07/12/2020 Vitamin D 25-Hydroxy of 22.3.  On review of systems, pt reports lightheadedness, dizziness, ice cravings, and denies acute bloody/black stools, abdominal pain, and any other symptoms.  INTERVAL HISTORY:  Monica Green is a wonderful 37 y.o. female who is here for f/u of iron deficiency anemia in pregnancy.  Patient was last seen by PA Thayil on 06/09/2022 and she complained of fatigue due to pregnancy, shortness of breath with exertion, and she had one flare of UC with diarrhea.   Patient notes she has been doing well since her last visit with PA Thayil. She is around [redacted] weeks pregnant with twins. She received IV Feraheme 510 mg x 2 doses last month.   She denies fever, chills, night sweats, unexpected weight loss, infection issues, abdominal pain, chest pain, vaginal bleeding, or leg swelling.  She is currently taking prenatal vitamins and taking iron supplement every couple of week.   MEDICAL HISTORY:  Past Medical History:  Diagnosis Date  Anemia    Colitis    IDA (iron deficiency anemia)    Pre-eclampsia    Ulcerative colitis ([facility])     SURGICAL HISTORY: Past Surgical History:  Procedure Laterality Date   FLEXIBLE SIGMOIDOSCOPY N/A 03/23/2015   Procedure: FLEXIBLE SIGMOIDOSCOPY;  Surgeon: Carman Ching, MD;  Location: [facility] ENDOSCOPY;  Service: Endoscopy;  Laterality: N/A;    SOCIAL HISTORY: Social History   Socioeconomic History   Marital status: Single    Spouse name: Not on  file   Number of children: 1   Years of education: Not on file   Highest education level: Not on file  Occupational History   Not on file  Tobacco Use   Smoking status: Never   Smokeless tobacco: Never  Vaping Use   Vaping Use: Never used  Substance and Sexual Activity   Alcohol use: Not Currently    Comment: Social   Drug use: No   Sexual activity: Yes  Other Topics Concern   Not on file  Social History Narrative   Not on file   Social Determinants of Health   Financial Resource Strain: Not on file  Food Insecurity: Not on file  Transportation Needs: Not on file  Physical Activity: Not on file  Stress: Not on file  Social Connections: Not on file  Intimate Partner Violence: Not on file    FAMILY HISTORY: Family History  Problem Relation Age of Onset   Hypertension Father    Cancer Father    Colon cancer Father    Asthma Sister    Asthma Brother    Hypertension Paternal Grandfather    Diabetes Neg Hx    Heart disease Neg Hx     ALLERGIES:  has No Known Allergies.  MEDICATIONS:  Current Outpatient Medications  Medication Sig Dispense Refill   potassium chloride SA (KLOR-CON M) 20 MEQ tablet Take 1 tablet (20 mEq total) by mouth 2 (two) times daily. 14 tablet 0   Prenatal Vit-Fe Fumarate-FA (PRENATAL MULTIVITAMIN) TABS tablet Take 1 tablet by mouth daily at 12 noon. 60 tablet 3   VITAMIN D3 50 MCG (2000 UT) capsule Take 2 capsules by mouth daily.     No current facility-administered medications for this visit.    REVIEW OF SYSTEMS:   .10 Point review of Systems was done is negative except as noted above.  PHYSICAL EXAMINATION: ECOG PERFORMANCE STATUS: 2 - Symptomatic, <50% confined to bed  . Vitals:   07/24/22 1235  BP: 109/71  Pulse: 86  Resp: 20  Temp: 97.9 F (36.6 C)  SpO2: 100%     Filed Weights   07/24/22 1235  Weight: 158 lb 12.8 oz (72 kg)  .Body mass index is 26.43 kg/m. Marland Kitchen GENERAL:alert, in no acute distress and  comfortable SKIN: no acute rashes, no significant lesions EYES: conjunctiva are pink and non-injected, sclera anicteric OROPHARYNX: MMM, no exudates, no oropharyngeal erythema or ulceration NECK: supple, no JVD LYMPH:  no palpable lymphadenopathy in the cervical, axillary or inguinal regions LUNGS: clear to auscultation b/l with normal respiratory effort HEART: regular rate & rhythm ABDOMEN:  normoactive bowel sounds , non tender, not distended. Extremity: no pedal edema PSYCH: alert & oriented x 3 with fluent speech NEURO: no focal motor/sensory deficits   LABORATORY DATA:  I have reviewed the data as listed  .    Latest Ref Rng & Units 07/24/2022   12:09 PM 06/09/2022    2:35 PM 07/21/2021    2:16 PM  CBC  WBC  4.0 - 10.5 K/uL 6.1  7.1  7.1   Hemoglobin 12.0 - 15.0 g/dL 9.7  8.3  47.8   Hematocrit 36.0 - 46.0 % 31.6  27.1  40.1   Platelets 150 - 400 K/uL 290  383  452.0     .    Latest Ref Rng & Units 07/24/2022   12:09 PM 06/12/2022   11:15 AM 06/09/2022    2:35 PM  CMP  Glucose 70 - 99 mg/dL 75  89  88   BUN 6 - 20 mg/dL 5  5  6    Creatinine 0.44 - 1.00 mg/dL 2.95  6.21  3.08   Sodium 135 - 145 mmol/L 136  136  136   Potassium 3.5 - 5.1 mmol/L 3.3  3.6  2.7   Chloride 98 - 111 mmol/L 106  106  102   CO2 22 - 32 mmol/L 24  23  26    Calcium 8.9 - 10.3 mg/dL 8.7  8.2  8.8   Total Protein 6.5 - 8.1 g/dL 6.7  6.6  7.1   Total Bilirubin 0.3 - 1.2 mg/dL 0.2  0.3  0.3   Alkaline Phos 38 - 126 U/L 98  74  81   AST 15 - 41 U/L 12  10  12    ALT 0 - 44 U/L 7  8  9     . Lab Results  Component Value Date   IRON 41 07/24/2022   TIBC 536 (H) 07/24/2022   IRONPCTSAT 8 (L) 07/24/2022   (Iron and TIBC)  Lab Results  Component Value Date   FERRITIN 14 07/24/2022    B12 -- 139--->341   RADIOGRAPHIC STUDIES: I have personally reviewed the radiological images as listed and agreed with the findings in the report.  ASSESSMENT & PLAN:   37 year old female currently at about  [redacted] weeks gestation with  #1  Iron deficiency anemia due to recent pregnancy and GI losses due to ulcerative colitis #2 vitamin B12 deficiency-likely due to poor absorption from ulcerative colitis and from increased demand during pregnancy.  #3  Hypokalemia potassium 3.3 due to diarrhea #4 history of ulcerative colitis currently having a flare and on prednisone taper  PLAN: -Discussed lab results from today, 07/24/2022, with the patient. CBC shows improvement in hemoglobin from 8.3 to 9.7 and hematocrit of 31.6. CMP shows slightly decreased potassium level at 3.3. B12 -- 147 -- will need to start B12 po daily in addition to her prenatal vitamins.  Iron labs --ferritin 14 with Iron saturation of 8% -Iron polysaccharide 150mg  po daily -Patient does not need IV Iron right know according to labs.  - -Continue prenatal vitamins, Vitamin B-12 supplements.   FOLLOW-UP: RTC with Dr Candise Che with labs in 4 months  The total time spent in the appointment was 21 minutes* .  All of the patient's questions were answered with apparent satisfaction. The patient knows to call the clinic with any problems, questions or concerns.   Wyvonnia Lora MD MS AAHIVMS [facility] Hematology/Oncology Physician [facility]  .*Total Encounter Time as defined by the Centers for Medicare and Medicaid Services includes, in addition to the face-to-face time of a patient visit (documented in the note above) non-face-to-face time: obtaining and reviewing outside history, ordering and reviewing medications, tests or procedures, care coordination (communications with other health care professionals or caregivers) and documentation in the medical record.    Colonel Bald, am acting as a Neurosurgeon for Wyvonnia Lora, MD.  .  I have reviewed the above documentation for accuracy and completeness, and I agree with the above. Brunetta Genera MD

## 2022-07-25 ENCOUNTER — Telehealth: Payer: Self-pay | Admitting: Hematology

## 2022-07-26 ENCOUNTER — Ambulatory Visit: Payer: Medicaid Other | Admitting: *Deleted

## 2022-07-26 ENCOUNTER — Other Ambulatory Visit: Payer: Self-pay | Admitting: *Deleted

## 2022-07-26 ENCOUNTER — Ambulatory Visit: Payer: Medicaid Other | Attending: Obstetrics and Gynecology

## 2022-07-26 VITALS — BP 110/65 | HR 89

## 2022-07-26 DIAGNOSIS — O365932 Maternal care for other known or suspected poor fetal growth, third trimester, fetus 2: Secondary | ICD-10-CM

## 2022-07-26 DIAGNOSIS — O35BXX2 Maternal care for other (suspected) fetal abnormality and damage, fetal cardiac anomalies, fetus 2: Secondary | ICD-10-CM

## 2022-07-26 DIAGNOSIS — O280 Abnormal hematological finding on antenatal screening of mother: Secondary | ICD-10-CM

## 2022-07-26 DIAGNOSIS — O365931 Maternal care for other known or suspected poor fetal growth, third trimester, fetus 1: Secondary | ICD-10-CM

## 2022-07-26 DIAGNOSIS — O09523 Supervision of elderly multigravida, third trimester: Secondary | ICD-10-CM

## 2022-07-26 DIAGNOSIS — D75839 Thrombocytosis, unspecified: Secondary | ICD-10-CM

## 2022-07-26 DIAGNOSIS — O30049 Twin pregnancy, dichorionic/diamniotic, unspecified trimester: Secondary | ICD-10-CM | POA: Diagnosis present

## 2022-07-26 DIAGNOSIS — O283 Abnormal ultrasonic finding on antenatal screening of mother: Secondary | ICD-10-CM | POA: Diagnosis present

## 2022-07-26 DIAGNOSIS — O43193 Other malformation of placenta, third trimester: Secondary | ICD-10-CM | POA: Diagnosis present

## 2022-07-26 DIAGNOSIS — O30043 Twin pregnancy, dichorionic/diamniotic, third trimester: Secondary | ICD-10-CM | POA: Diagnosis not present

## 2022-07-26 DIAGNOSIS — D649 Anemia, unspecified: Secondary | ICD-10-CM

## 2022-07-26 DIAGNOSIS — O3513X0 Maternal care for (suspected) chromosomal abnormality in fetus, trisomy 21, not applicable or unspecified: Secondary | ICD-10-CM | POA: Diagnosis not present

## 2022-07-26 DIAGNOSIS — O99013 Anemia complicating pregnancy, third trimester: Secondary | ICD-10-CM

## 2022-07-26 DIAGNOSIS — O09293 Supervision of pregnancy with other poor reproductive or obstetric history, third trimester: Secondary | ICD-10-CM

## 2022-07-26 DIAGNOSIS — Z3A32 32 weeks gestation of pregnancy: Secondary | ICD-10-CM

## 2022-07-26 DIAGNOSIS — O35BXX1 Maternal care for other (suspected) fetal abnormality and damage, fetal cardiac anomalies, fetus 1: Secondary | ICD-10-CM

## 2022-07-26 DIAGNOSIS — O35EXX2 Maternal care for other (suspected) fetal abnormality and damage, fetal genitourinary anomalies, fetus 2: Secondary | ICD-10-CM | POA: Diagnosis not present

## 2022-07-26 DIAGNOSIS — O35AXX2 Maternal care for other (suspected) fetal abnormality and damage, fetal facial anomalies, fetus 2: Secondary | ICD-10-CM

## 2022-07-26 DIAGNOSIS — O99113 Other diseases of the blood and blood-forming organs and certain disorders involving the immune mechanism complicating pregnancy, third trimester: Secondary | ICD-10-CM

## 2022-07-26 NOTE — Procedures (Signed)
Monica Green 1986/02/03 [redacted]w[redacted]d   Fetus B Non-Stress Test Interpretation for 07/26/22  Indication: IUGR  Fetal Heart Rate Fetus B Mode: External Baseline Rate (B): 135 BPM Variability: Moderate Accelerations: None Decelerations: None  Uterine Activity Mode: Palpation, Toco Contraction Frequency (min): UI Contraction Quality: Mild Resting Tone Palpated: Relaxed Resting Time: Adequate  Interpretation (Baby B - Fetal Testing) Nonstress Test Interpretation (Baby B): Non-reactive Overall Impression (Baby B): Reassuring for gestational age Comments (Baby B): Dr. Parke Poisson reviewed tracing. Patient to have [place], BPP following NST.  Monica Green [birth year] [redacted]w[redacted]d  Fetus A Non-Stress Test Interpretation for 07/26/22  Indication: IUGR  Fetal Heart Rate A Mode: External Baseline Rate (A): 140 bpm Variability: Moderate Accelerations: 15 x 15 Decelerations: None Multiple birth?: Yes  Uterine Activity Mode: Palpation, Toco Contraction Frequency (min): UI Contraction Quality: Mild Resting Tone Palpated: Relaxed Resting Time: Adequate  Interpretation (Fetal Testing) Nonstress Test Interpretation: Reactive Overall Impression: Reassuring for gestational age Comments: Dr. Parke Poisson reviewed tracing.

## 2022-07-28 ENCOUNTER — Ambulatory Visit: Payer: Medicaid Other | Attending: Maternal & Fetal Medicine

## 2022-07-28 ENCOUNTER — Ambulatory Visit: Payer: Medicaid Other | Admitting: *Deleted

## 2022-07-28 VITALS — BP 102/73 | HR 95

## 2022-07-28 DIAGNOSIS — O35BXX1 Maternal care for other (suspected) fetal abnormality and damage, fetal cardiac anomalies, fetus 1: Secondary | ICD-10-CM | POA: Diagnosis not present

## 2022-07-28 DIAGNOSIS — D649 Anemia, unspecified: Secondary | ICD-10-CM

## 2022-07-28 DIAGNOSIS — O43193 Other malformation of placenta, third trimester: Secondary | ICD-10-CM

## 2022-07-28 DIAGNOSIS — O99013 Anemia complicating pregnancy, third trimester: Secondary | ICD-10-CM

## 2022-07-28 DIAGNOSIS — O35BXX2 Maternal care for other (suspected) fetal abnormality and damage, fetal cardiac anomalies, fetus 2: Secondary | ICD-10-CM | POA: Diagnosis not present

## 2022-07-28 DIAGNOSIS — O365932 Maternal care for other known or suspected poor fetal growth, third trimester, fetus 2: Secondary | ICD-10-CM

## 2022-07-28 DIAGNOSIS — D75839 Thrombocytosis, unspecified: Secondary | ICD-10-CM

## 2022-07-28 DIAGNOSIS — O3513X0 Maternal care for (suspected) chromosomal abnormality in fetus, trisomy 21, not applicable or unspecified: Secondary | ICD-10-CM

## 2022-07-28 DIAGNOSIS — O365931 Maternal care for other known or suspected poor fetal growth, third trimester, fetus 1: Secondary | ICD-10-CM

## 2022-07-28 DIAGNOSIS — Z3A32 32 weeks gestation of pregnancy: Secondary | ICD-10-CM

## 2022-07-28 DIAGNOSIS — O99113 Other diseases of the blood and blood-forming organs and certain disorders involving the immune mechanism complicating pregnancy, third trimester: Secondary | ICD-10-CM

## 2022-07-28 DIAGNOSIS — O365930 Maternal care for other known or suspected poor fetal growth, third trimester, not applicable or unspecified: Secondary | ICD-10-CM | POA: Diagnosis present

## 2022-07-28 DIAGNOSIS — O30043 Twin pregnancy, dichorionic/diamniotic, third trimester: Secondary | ICD-10-CM | POA: Diagnosis not present

## 2022-07-28 DIAGNOSIS — O35AXX2 Maternal care for other (suspected) fetal abnormality and damage, fetal facial anomalies, fetus 2: Secondary | ICD-10-CM

## 2022-07-28 DIAGNOSIS — O35EXX2 Maternal care for other (suspected) fetal abnormality and damage, fetal genitourinary anomalies, fetus 2: Secondary | ICD-10-CM

## 2022-07-28 DIAGNOSIS — O09523 Supervision of elderly multigravida, third trimester: Secondary | ICD-10-CM | POA: Insufficient documentation

## 2022-07-28 DIAGNOSIS — O09293 Supervision of pregnancy with other poor reproductive or obstetric history, third trimester: Secondary | ICD-10-CM

## 2022-07-28 NOTE — Procedures (Signed)
Monica Green 07/07/1985 [redacted]w[redacted]d   Fetus B Non-Stress Test Interpretation for 07/28/22  Indication: IUGR  Fetal Heart Rate Fetus B Mode: External Baseline Rate (B): 135 BPM Variability: Moderate Accelerations: 15 x 15, 10 x 10 (1 - 10x10, 1- 15x15)  Uterine Activity Mode: Toco Contraction Frequency (min): ui Resting Tone Palpated: Relaxed  Interpretation (Baby B - Fetal Testing) Nonstress Test Interpretation (Baby B): Non-reactive Overall Impression (Baby B): Reassuring for gestational age Comments (Baby B): tracing reviewed by Dr. Victory Dakin Aug 14, 1985 [redacted]w[redacted]d  Fetus A Non-Stress Test Interpretation for 07/28/22  Indication: IUGR  Fetal Heart Rate A Mode: External Baseline Rate (A): 140 bpm Variability: Moderate Accelerations: 10 x 10, 15 x 15 (1- 10x10, 1- 15x15) Decelerations: None Multiple birth?: Yes  Uterine Activity Mode: Toco Contraction Frequency (min): ui Resting Tone Palpated: Relaxed  Interpretation (Fetal Testing) Nonstress Test Interpretation: Non-reactive Overall Impression: Reassuring for gestational age Comments: tracing reviewed by Dr. Parke Poisson

## 2022-07-31 ENCOUNTER — Encounter: Payer: Self-pay | Admitting: Hematology

## 2022-07-31 MED ORDER — B-12 1000 MCG SL SUBL: 1000.0000 ug | SUBLINGUAL_TABLET | Freq: Every day | SUBLINGUAL | 11 refills | Status: DC

## 2022-07-31 MED ORDER — POLYSACCHARIDE IRON COMPLEX 150 MG PO CAPS: 150.0000 mg | ORAL_CAPSULE | Freq: Every day | ORAL | 5 refills | Status: DC

## 2022-07-31 NOTE — Addendum Note (Signed)
Addended by: Wyvonnia Lora on: 07/31/2022 01:05 AM   Modules accepted: Orders

## 2022-08-01 ENCOUNTER — Other Ambulatory Visit: Payer: Self-pay | Admitting: Obstetrics and Gynecology

## 2022-08-01 ENCOUNTER — Ambulatory Visit: Payer: Medicaid Other | Admitting: *Deleted

## 2022-08-01 ENCOUNTER — Ambulatory Visit: Payer: Medicaid Other | Attending: Obstetrics and Gynecology

## 2022-08-01 VITALS — BP 106/70 | HR 86

## 2022-08-01 DIAGNOSIS — D649 Anemia, unspecified: Secondary | ICD-10-CM

## 2022-08-01 DIAGNOSIS — O30043 Twin pregnancy, dichorionic/diamniotic, third trimester: Secondary | ICD-10-CM

## 2022-08-01 DIAGNOSIS — O365931 Maternal care for other known or suspected poor fetal growth, third trimester, fetus 1: Secondary | ICD-10-CM

## 2022-08-01 DIAGNOSIS — O283 Abnormal ultrasonic finding on antenatal screening of mother: Secondary | ICD-10-CM

## 2022-08-01 DIAGNOSIS — O365932 Maternal care for other known or suspected poor fetal growth, third trimester, fetus 2: Secondary | ICD-10-CM

## 2022-08-01 DIAGNOSIS — O43193 Other malformation of placenta, third trimester: Secondary | ICD-10-CM

## 2022-08-01 DIAGNOSIS — O35AXX2 Maternal care for other (suspected) fetal abnormality and damage, fetal facial anomalies, fetus 2: Secondary | ICD-10-CM | POA: Diagnosis not present

## 2022-08-01 DIAGNOSIS — O3513X0 Maternal care for (suspected) chromosomal abnormality in fetus, trisomy 21, not applicable or unspecified: Secondary | ICD-10-CM | POA: Diagnosis not present

## 2022-08-01 DIAGNOSIS — O99013 Anemia complicating pregnancy, third trimester: Secondary | ICD-10-CM

## 2022-08-01 DIAGNOSIS — O35BXX1 Maternal care for other (suspected) fetal abnormality and damage, fetal cardiac anomalies, fetus 1: Secondary | ICD-10-CM | POA: Diagnosis not present

## 2022-08-01 DIAGNOSIS — O35BXX2 Maternal care for other (suspected) fetal abnormality and damage, fetal cardiac anomalies, fetus 2: Secondary | ICD-10-CM

## 2022-08-01 DIAGNOSIS — O35EXX2 Maternal care for other (suspected) fetal abnormality and damage, fetal genitourinary anomalies, fetus 2: Secondary | ICD-10-CM

## 2022-08-01 DIAGNOSIS — O365930 Maternal care for other known or suspected poor fetal growth, third trimester, not applicable or unspecified: Secondary | ICD-10-CM

## 2022-08-01 DIAGNOSIS — O99113 Other diseases of the blood and blood-forming organs and certain disorders involving the immune mechanism complicating pregnancy, third trimester: Secondary | ICD-10-CM

## 2022-08-01 DIAGNOSIS — O09523 Supervision of elderly multigravida, third trimester: Secondary | ICD-10-CM

## 2022-08-01 DIAGNOSIS — O09293 Supervision of pregnancy with other poor reproductive or obstetric history, third trimester: Secondary | ICD-10-CM

## 2022-08-01 DIAGNOSIS — D75839 Thrombocytosis, unspecified: Secondary | ICD-10-CM

## 2022-08-01 DIAGNOSIS — Z3A33 33 weeks gestation of pregnancy: Secondary | ICD-10-CM

## 2022-08-01 NOTE — Procedures (Signed)
Monica Green 18-Aug-1985 [redacted]w[redacted]d  Fetus A Non-Stress Test Interpretation for 08/01/22  Indication: IUGR  Fetal Heart Rate A Mode: External Baseline Rate (A): 140 bpm Variability: Moderate Accelerations: 15 x 15 Decelerations: None Multiple birth?: Yes  Uterine Activity Mode: Palpation, Toco Contraction Frequency (min): Rare w/erratic UI Contraction Duration (sec): 10-70 Contraction Quality: Mild Resting Tone Palpated: Relaxed Resting Time: Adequate  Interpretation (Fetal Testing) Nonstress Test Interpretation: Reactive Comments: Dr. Darra Lis reviewed tracing.  Monica Green [birth year] [redacted]w[redacted]d   Fetus B Non-Stress Test Interpretation for 08/01/22  Indication: IUGR and Unsatisfactory BPP  Fetal Heart Rate Fetus B Mode: External Baseline Rate (B): 135 BPM Accelerations: 15 x 15, 10 x 10 Decelerations: None  Uterine Activity Mode: Palpation, Toco Contraction Frequency (min): Rare w/erratic UI Contraction Duration (sec): 10-70 Contraction Quality: Mild Resting Tone Palpated: Relaxed Resting Time: Adequate  Interpretation (Baby B - Fetal Testing) Nonstress Test Interpretation (Baby B): Reactive Comments (Baby B): Dr. Darra Lis reviewed tracing.

## 2022-08-02 ENCOUNTER — Ambulatory Visit: Payer: Medicaid Other

## 2022-08-04 ENCOUNTER — Ambulatory Visit: Payer: Medicaid Other | Admitting: *Deleted

## 2022-08-04 ENCOUNTER — Ambulatory Visit: Payer: Medicaid Other | Attending: Obstetrics and Gynecology

## 2022-08-04 VITALS — BP 121/76 | HR 96

## 2022-08-04 DIAGNOSIS — O30043 Twin pregnancy, dichorionic/diamniotic, third trimester: Secondary | ICD-10-CM | POA: Insufficient documentation

## 2022-08-04 DIAGNOSIS — O283 Abnormal ultrasonic finding on antenatal screening of mother: Secondary | ICD-10-CM | POA: Diagnosis present

## 2022-08-04 DIAGNOSIS — O35BXX1 Maternal care for other (suspected) fetal abnormality and damage, fetal cardiac anomalies, fetus 1: Secondary | ICD-10-CM | POA: Diagnosis not present

## 2022-08-04 DIAGNOSIS — O43193 Other malformation of placenta, third trimester: Secondary | ICD-10-CM

## 2022-08-04 DIAGNOSIS — D649 Anemia, unspecified: Secondary | ICD-10-CM

## 2022-08-04 DIAGNOSIS — O3513X0 Maternal care for (suspected) chromosomal abnormality in fetus, trisomy 21, not applicable or unspecified: Secondary | ICD-10-CM

## 2022-08-04 DIAGNOSIS — O35BXX2 Maternal care for other (suspected) fetal abnormality and damage, fetal cardiac anomalies, fetus 2: Secondary | ICD-10-CM

## 2022-08-04 DIAGNOSIS — O99013 Anemia complicating pregnancy, third trimester: Secondary | ICD-10-CM

## 2022-08-04 DIAGNOSIS — O365992 Maternal care for other known or suspected poor fetal growth, unspecified trimester, fetus 2: Secondary | ICD-10-CM | POA: Insufficient documentation

## 2022-08-04 DIAGNOSIS — O35EXX2 Maternal care for other (suspected) fetal abnormality and damage, fetal genitourinary anomalies, fetus 2: Secondary | ICD-10-CM

## 2022-08-04 DIAGNOSIS — O365931 Maternal care for other known or suspected poor fetal growth, third trimester, fetus 1: Secondary | ICD-10-CM

## 2022-08-04 DIAGNOSIS — O365932 Maternal care for other known or suspected poor fetal growth, third trimester, fetus 2: Secondary | ICD-10-CM | POA: Diagnosis present

## 2022-08-04 DIAGNOSIS — O35AXX2 Maternal care for other (suspected) fetal abnormality and damage, fetal facial anomalies, fetus 2: Secondary | ICD-10-CM | POA: Diagnosis not present

## 2022-08-04 DIAGNOSIS — O365991 Maternal care for other known or suspected poor fetal growth, unspecified trimester, fetus 1: Secondary | ICD-10-CM

## 2022-08-04 DIAGNOSIS — Z3A33 33 weeks gestation of pregnancy: Secondary | ICD-10-CM

## 2022-08-04 NOTE — Procedures (Signed)
[place] Em[place] Feb 19, 1986 [redacted]w[redacted]d   Fetus B Non-Stress Test Interpretation for 08/04/22  Indication: IUGR  Fetal Heart Rate Fetus B Mode: External Baseline Rate (B): 130 BPM Variability: Moderate Accelerations: 15 x 15 Decelerations: None  Uterine Activity Mode: Toco Contraction Frequency (min): UI Resting Tone Palpated: Relaxed  Interpretation (Baby B - Fetal Testing) Nonstress Test Interpretation (Baby B): Reactive Overall Impression (Baby B): Reassuring for gestational age Comments (Baby B): Tracing reviewed by Dr. Darra Lis   [birth year] [redacted]w[redacted]d  Fetus A Non-Stress Test Interpretation for 08/04/22  Indication: IUGR  Fetal Heart Rate A Mode: External Baseline Rate (A): 145 bpm Variability: Moderate Accelerations: 15 x 15 Decelerations: None Multiple birth?: Yes  Uterine Activity Mode: Toco Contraction Frequency (min): UI Resting Tone Palpated: Relaxed  Interpretation (Fetal Testing) Nonstress Test Interpretation: Reactive Overall Impression: Reassuring for gestational age Comments: Tracing reviewwed by Dr. Darra Lis

## 2022-08-07 ENCOUNTER — Ambulatory Visit: Payer: Medicaid Other | Admitting: *Deleted

## 2022-08-07 ENCOUNTER — Other Ambulatory Visit: Payer: Self-pay | Admitting: *Deleted

## 2022-08-07 ENCOUNTER — Other Ambulatory Visit: Payer: Self-pay | Admitting: Obstetrics and Gynecology

## 2022-08-07 ENCOUNTER — Inpatient Hospital Stay (HOSPITAL_COMMUNITY)
Admission: AD | Admit: 2022-08-07 | Discharge: 2022-08-13 | DRG: 787 | Disposition: A | Discharge status: Home / Self Care | Payer: Medicaid Other | Attending: Obstetrics and Gynecology | Admitting: Obstetrics and Gynecology

## 2022-08-07 ENCOUNTER — Ambulatory Visit (HOSPITAL_BASED_OUTPATIENT_CLINIC_OR_DEPARTMENT_OTHER): Payer: Medicaid Other

## 2022-08-07 ENCOUNTER — Encounter (HOSPITAL_COMMUNITY): Payer: Self-pay | Admitting: Obstetrics and Gynecology

## 2022-08-07 ENCOUNTER — Other Ambulatory Visit: Payer: Self-pay

## 2022-08-07 VITALS — BP 101/63 | HR 96

## 2022-08-07 DIAGNOSIS — Z3A34 34 weeks gestation of pregnancy: Secondary | ICD-10-CM

## 2022-08-07 DIAGNOSIS — Z20822 Contact with and (suspected) exposure to covid-19: Secondary | ICD-10-CM | POA: Diagnosis present

## 2022-08-07 DIAGNOSIS — O9902 Anemia complicating childbirth: Secondary | ICD-10-CM | POA: Diagnosis present

## 2022-08-07 DIAGNOSIS — O4700 False labor before 37 completed weeks of gestation, unspecified trimester: Secondary | ICD-10-CM | POA: Diagnosis present

## 2022-08-07 DIAGNOSIS — O30043 Twin pregnancy, dichorionic/diamniotic, third trimester: Secondary | ICD-10-CM | POA: Insufficient documentation

## 2022-08-07 DIAGNOSIS — O4423 Partial placenta previa NOS or without hemorrhage, third trimester: Secondary | ICD-10-CM | POA: Diagnosis not present

## 2022-08-07 DIAGNOSIS — O365931 Maternal care for other known or suspected poor fetal growth, third trimester, fetus 1: Secondary | ICD-10-CM

## 2022-08-07 DIAGNOSIS — K219 Gastro-esophageal reflux disease without esophagitis: Secondary | ICD-10-CM | POA: Diagnosis present

## 2022-08-07 DIAGNOSIS — O09523 Supervision of elderly multigravida, third trimester: Secondary | ICD-10-CM | POA: Diagnosis not present

## 2022-08-07 DIAGNOSIS — O43193 Other malformation of placenta, third trimester: Secondary | ICD-10-CM | POA: Insufficient documentation

## 2022-08-07 DIAGNOSIS — O365932 Maternal care for other known or suspected poor fetal growth, third trimester, fetus 2: Secondary | ICD-10-CM

## 2022-08-07 DIAGNOSIS — D509 Iron deficiency anemia, unspecified: Secondary | ICD-10-CM | POA: Diagnosis present

## 2022-08-07 DIAGNOSIS — O99013 Anemia complicating pregnancy, third trimester: Secondary | ICD-10-CM | POA: Diagnosis not present

## 2022-08-07 DIAGNOSIS — O35AXX2 Maternal care for other (suspected) fetal abnormality and damage, fetal facial anomalies, fetus 2: Secondary | ICD-10-CM

## 2022-08-07 DIAGNOSIS — O43123 Velamentous insertion of umbilical cord, third trimester: Secondary | ICD-10-CM | POA: Diagnosis present

## 2022-08-07 DIAGNOSIS — O3513X0 Maternal care for (suspected) chromosomal abnormality in fetus, trisomy 21, not applicable or unspecified: Secondary | ICD-10-CM

## 2022-08-07 DIAGNOSIS — O283 Abnormal ultrasonic finding on antenatal screening of mother: Secondary | ICD-10-CM

## 2022-08-07 DIAGNOSIS — O368390 Maternal care for abnormalities of the fetal heart rate or rhythm, unspecified trimester, not applicable or unspecified: Secondary | ICD-10-CM | POA: Diagnosis present

## 2022-08-07 DIAGNOSIS — O321XX2 Maternal care for breech presentation, fetus 2: Secondary | ICD-10-CM | POA: Diagnosis present

## 2022-08-07 DIAGNOSIS — Z98891 History of uterine scar from previous surgery: Secondary | ICD-10-CM

## 2022-08-07 DIAGNOSIS — O365930 Maternal care for other known or suspected poor fetal growth, third trimester, not applicable or unspecified: Secondary | ICD-10-CM | POA: Insufficient documentation

## 2022-08-07 DIAGNOSIS — O35EXX2 Maternal care for other (suspected) fetal abnormality and damage, fetal genitourinary anomalies, fetus 2: Secondary | ICD-10-CM

## 2022-08-07 DIAGNOSIS — O9962 Diseases of the digestive system complicating childbirth: Secondary | ICD-10-CM | POA: Diagnosis present

## 2022-08-07 DIAGNOSIS — O35BXX1 Maternal care for other (suspected) fetal abnormality and damage, fetal cardiac anomalies, fetus 1: Secondary | ICD-10-CM | POA: Diagnosis not present

## 2022-08-07 DIAGNOSIS — O3513X2 Maternal care for (suspected) chromosomal abnormality in fetus, trisomy 21, fetus 2: Secondary | ICD-10-CM | POA: Diagnosis present

## 2022-08-07 DIAGNOSIS — O280 Abnormal hematological finding on antenatal screening of mother: Secondary | ICD-10-CM

## 2022-08-07 DIAGNOSIS — O35BXX2 Maternal care for other (suspected) fetal abnormality and damage, fetal cardiac anomalies, fetus 2: Secondary | ICD-10-CM | POA: Diagnosis not present

## 2022-08-07 DIAGNOSIS — D649 Anemia, unspecified: Secondary | ICD-10-CM | POA: Diagnosis not present

## 2022-08-07 DIAGNOSIS — O99113 Other diseases of the blood and blood-forming organs and certain disorders involving the immune mechanism complicating pregnancy, third trimester: Secondary | ICD-10-CM | POA: Diagnosis not present

## 2022-08-07 DIAGNOSIS — D75839 Thrombocytosis, unspecified: Secondary | ICD-10-CM | POA: Diagnosis not present

## 2022-08-07 LAB — URINALYSIS, ROUTINE W REFLEX MICROSCOPIC
Bilirubin Urine: NEGATIVE
Glucose, UA: NEGATIVE mg/dL
Hgb urine dipstick: NEGATIVE
Ketones, ur: 80 mg/dL — AB
Nitrite: NEGATIVE
Protein, ur: 30 mg/dL — AB
Specific Gravity, Urine: 1.014 (ref 1.005–1.030)
pH: 6 (ref 5.0–8.0)

## 2022-08-07 LAB — COMPREHENSIVE METABOLIC PANEL
ALT: 13 U/L (ref 0–44)
AST: 16 U/L (ref 15–41)
Albumin: 2.2 g/dL — ABNORMAL LOW (ref 3.5–5.0)
Alkaline Phosphatase: 133 U/L — ABNORMAL HIGH (ref 38–126)
Anion gap: 14 (ref 5–15)
BUN: <5 mg/dL — ABNORMAL LOW (ref 6–20)
CO2: 16 mmol/L — ABNORMAL LOW (ref 22–32)
Calcium: 8.7 mg/dL — ABNORMAL LOW (ref 8.9–10.3)
Chloride: 104 mmol/L (ref 98–111)
Creatinine, Ser: 0.64 mg/dL (ref 0.44–1.00)
GFR, Estimated: >60 mL/min (ref >60)
Glucose, Bld: 83 mg/dL (ref 70–99)
Potassium: 2.4 mmol/L — CL (ref 3.5–5.1)
Sodium: 134 mmol/L — ABNORMAL LOW (ref 135–145)
Total Bilirubin: 0.7 mg/dL (ref 0.3–1.2)
Total Protein: 6.6 g/dL (ref 6.5–8.1)

## 2022-08-07 LAB — CBC
HCT: 32.2 % — ABNORMAL LOW (ref 36.0–46.0)
Hemoglobin: 10.4 g/dL — ABNORMAL LOW (ref 12.0–15.0)
MCH: 25.6 pg — ABNORMAL LOW (ref 26.0–34.0)
MCHC: 32.3 g/dL (ref 30.0–36.0)
MCV: 79.1 fL — ABNORMAL LOW (ref 80.0–100.0)
Platelets: 264 10*3/uL (ref 150–400)
RBC: 4.07 MIL/uL (ref 3.87–5.11)
RDW: 22.5 % — ABNORMAL HIGH (ref 11.5–15.5)
WBC: 10.3 10*3/uL (ref 4.0–10.5)
nRBC: 0 % (ref 0.0–0.2)

## 2022-08-07 LAB — GROUP B STREP BY PCR: Group B strep by PCR: NEGATIVE

## 2022-08-07 MED ORDER — DOCUSATE SODIUM 100 MG PO CAPS: 100.0000 mg | ORAL_CAPSULE | Freq: Every day | ORAL | Status: DC

## 2022-08-07 MED ORDER — PRENATAL MULTIVITAMIN CH: 1.0000 | ORAL_TABLET | Freq: Every day | ORAL | Status: DC

## 2022-08-07 MED ORDER — POTASSIUM CHLORIDE 10 MEQ/100ML IV SOLN
10.0000 meq | INTRAVENOUS | Status: AC
  Administered 2022-08-07 – 2022-08-08 (×3): 10 meq via INTRAVENOUS
  Filled 2022-08-07 (×3): qty 100

## 2022-08-07 MED ORDER — CALCIUM CARBONATE ANTACID 500 MG PO CHEW
1.0000 | CHEWABLE_TABLET | ORAL | Status: AC
  Administered 2022-08-07: 200 mg via ORAL
  Filled 2022-08-07: qty 1

## 2022-08-07 MED ORDER — LACTATED RINGERS IV SOLN: 125.0000 mL/h | INTRAVENOUS | Status: AC

## 2022-08-07 MED ORDER — LOPERAMIDE HCL 2 MG PO CAPS: 2.0000 mg | ORAL_CAPSULE | ORAL | Status: DC | PRN

## 2022-08-07 MED ORDER — ACETAMINOPHEN 325 MG PO TABS: 650.0000 mg | ORAL_TABLET | ORAL | Status: DC | PRN

## 2022-08-07 MED ORDER — LACTATED RINGERS IV BOLUS
1000.0000 mL | Freq: Once | INTRAVENOUS | Status: AC
  Administered 2022-08-07: 1000 mL via INTRAVENOUS

## 2022-08-07 MED ORDER — FERROUS SULFATE 325 (65 FE) MG PO TABS
325.0000 mg | ORAL_TABLET | ORAL | Status: DC
  Administered 2022-08-08: 325 mg via ORAL
  Filled 2022-08-07: qty 1

## 2022-08-07 MED ORDER — BETAMETHASONE SOD PHOS & ACET 6 (3-3) MG/ML IJ SUSP
12.0000 mg | Freq: Two times a day (BID) | INTRAMUSCULAR | Status: AC
  Administered 2022-08-07 – 2022-08-08 (×2): 12 mg via INTRAMUSCULAR

## 2022-08-07 MED ORDER — BETAMETHASONE SOD PHOS & ACET 6 (3-3) MG/ML IJ SUSP
12.0000 mg | INTRAMUSCULAR | Status: DC
  Filled 2022-08-07: qty 5

## 2022-08-07 MED ORDER — POTASSIUM CHLORIDE CRYS ER 20 MEQ PO TBCR
40.0000 meq | EXTENDED_RELEASE_TABLET | Freq: Once | ORAL | Status: AC
  Administered 2022-08-07: 40 meq via ORAL
  Filled 2022-08-07: qty 2

## 2022-08-07 MED ORDER — NIFEDIPINE 10 MG PO CAPS
10.0000 mg | ORAL_CAPSULE | ORAL | Status: DC | PRN
  Administered 2022-08-07 (×2): 10 mg via ORAL
  Filled 2022-08-07 (×2): qty 1

## 2022-08-07 MED ORDER — ZOLPIDEM TARTRATE 5 MG PO TABS: 5.0000 mg | ORAL_TABLET | Freq: Every evening | ORAL | Status: DC | PRN

## 2022-08-07 MED ORDER — LACTATED RINGERS IV BOLUS
500.0000 mL | Freq: Once | INTRAVENOUS | Status: AC
  Administered 2022-08-07: 500 mL via INTRAVENOUS

## 2022-08-07 MED ORDER — CALCIUM CARBONATE ANTACID 500 MG PO CHEW
2.0000 | CHEWABLE_TABLET | ORAL | Status: DC | PRN
  Administered 2022-08-08 – 2022-08-09 (×3): 400 mg via ORAL
  Filled 2022-08-07 (×3): qty 2

## 2022-08-07 MED ORDER — DOCUSATE SODIUM 100 MG PO CAPS
100.0000 mg | ORAL_CAPSULE | Freq: Every day | ORAL | Status: DC
  Filled 2022-08-07: qty 1

## 2022-08-07 MED ORDER — DEXTROSE IN LACTATED RINGERS 5 % IV SOLN: INTRAVENOUS | Status: DC

## 2022-08-07 MED ORDER — LACTATED RINGERS IV SOLN: INTRAVENOUS | Status: DC

## 2022-08-07 MED ORDER — BETAMETHASONE SOD PHOS & ACET 6 (3-3) MG/ML IJ SUSP: 12.0000 mg | Freq: Two times a day (BID) | INTRAMUSCULAR | Status: DC

## 2022-08-07 MED ORDER — FAMOTIDINE IN NACL 20-0.9 MG/50ML-% IV SOLN: 20.0000 mg | Freq: Two times a day (BID) | INTRAVENOUS | Status: DC | PRN

## 2022-08-07 MED ORDER — SODIUM CHLORIDE 0.9 % IV SOLN
25.0000 mg | Freq: Four times a day (QID) | INTRAVENOUS | Status: DC | PRN
  Administered 2022-08-07 – 2022-08-10 (×2): 25 mg via INTRAVENOUS
  Filled 2022-08-07: qty 1
  Filled 2022-08-07: qty 25

## 2022-08-07 NOTE — MAU Note (Signed)
Monica Green is a 37 y.o. at [redacted]w[redacted]d here in MAU reporting: sent from MFM, sent over for fluids and more monitoring.  FH went down with contractions.   Is ?dehydrated,been using the bathroom a lot, hx of ulcerative colitis.  Onset of complaint: increase in BM's over the weekend No leaking or bleeding.  Babies are moving.  Pain score: mild cramping Vitals:   08/07/22 1434  BP: 115/72  Pulse: (!) 108  Resp: 18  Temp: 98.1 F (36.7 C)  SpO2: 100%       FHT:   TWINS   Lab orders placed from triage:  urine

## 2022-08-07 NOTE — MAU Provider Note (Signed)
History     CSN: [id number]  Arrival date and time: 08/07/22 1415   Event Date/Time   First Provider Initiated Contact with Patient 08/07/22 1609      Chief Complaint  Patient presents with   Dehydration   Abdominal Pain   37 y.o. G2P1001 @34 .0 wks with didi twins sent from MFM for FHR decels of twin B. Reports good FM. Denies ctx, VB, or LOF.    OB History     Gravida  2   Para  1   Term  1   Preterm      AB      Living  1      SAB      IAB      Ectopic      Multiple  0   Live Births  1           Past Medical History:  Diagnosis Date   Anemia    Colitis    IDA (iron deficiency anemia)    Pre-eclampsia    Ulcerative colitis (HCC)     Past Surgical History:  Procedure Laterality Date   FLEXIBLE SIGMOIDOSCOPY N/A 03/23/2015   Procedure: FLEXIBLE SIGMOIDOSCOPY;  Surgeon: Carman Ching, MD;  Location: [facility] ENDOSCOPY;  Service: Endoscopy;  Laterality: N/A;    Family History  Problem Relation Age of Onset   Hypertension Father    Cancer Father    Colon cancer Father    Asthma Sister    Asthma Brother    Hypertension Paternal Grandfather    Diabetes Neg Hx    Heart disease Neg Hx     Social History   Tobacco Use   Smoking status: Never   Smokeless tobacco: Never  Vaping Use   Vaping Use: Never used  Substance Use Topics   Alcohol use: Not Currently    Comment: Social   Drug use: No    Allergies: No Known Allergies  Medications Prior to Admission  Medication Sig Dispense Refill Last Dose   iron polysaccharides (NIFEREX) 150 MG capsule Take 1 capsule (150 mg total) by mouth daily. 30 capsule 5 08/06/2022   potassium chloride SA (KLOR-CON M) 20 MEQ tablet Take 1 tablet (20 mEq total) by mouth 2 (two) times daily. 14 tablet 0 Past Week   Cyanocobalamin (B-12) 1000 MCG SUBL Place 1,000 mcg under the tongue daily. 30 tablet 11 08/04/2022   Prenatal Vit-Fe Fumarate-FA (PRENATAL MULTIVITAMIN) TABS tablet Take 1 tablet by mouth daily at 12  noon. 60 tablet 3 08/05/2022   VITAMIN D3 50 MCG (2000 UT) capsule Take 2 capsules by mouth daily.       Review of Systems  Gastrointestinal:  Negative for abdominal pain.  Genitourinary:  Negative for vaginal bleeding and vaginal discharge.   Physical Exam   Blood pressure 115/72, pulse (!) 108, temperature 98.1 F (36.7 C), temperature source Oral, resp. rate 18, height 5\' 5"  (1.651 m), weight 68.4 kg, last menstrual period 12/06/2021, SpO2 100 %, unknown if currently breastfeeding.  Physical Exam Vitals and nursing note reviewed.  Constitutional:      General: She is not in acute distress.    Appearance: Normal appearance.  HENT:     Head: Normocephalic and atraumatic.  Cardiovascular:     Rate and Rhythm: Normal rate.  Pulmonary:     Effort: Pulmonary effort is normal. No respiratory distress.  Musculoskeletal:        General: Normal range of motion.     Cervical back: Normal  range of motion.  Neurological:     General: No focal deficit present.     Mental Status: She is alert and oriented to person, place, and time.  Psychiatric:        Mood and Affect: Mood normal.        Behavior: Behavior normal.   EFM-A: 155 bpm, mod variability, + accels, no decels EFM-B: 145 bpm, mod variability, + accels, occ late decels Toco: irreg  Results for orders placed or performed during the hospital encounter of 08/07/22 (from the past 24 hour(s))  Urinalysis, Routine w reflex microscopic -Urine, Clean Catch     Status: Abnormal   Collection Time: 08/07/22  3:59 PM  Result Value Ref Range   Color, Urine YELLOW YELLOW   APPearance CLEAR CLEAR   Specific Gravity, Urine 1.014 1.005 - 1.030   pH 6.0 5.0 - 8.0   Glucose, UA NEGATIVE NEGATIVE mg/dL   Hgb urine dipstick NEGATIVE NEGATIVE   Bilirubin Urine NEGATIVE NEGATIVE   Ketones, ur 80 (A) NEGATIVE mg/dL   Protein, ur 30 (A) NEGATIVE mg/dL   Nitrite NEGATIVE NEGATIVE   Leukocytes,Ua SMALL (A) NEGATIVE   RBC / HPF 0-5 0 - 5 RBC/hpf    WBC, UA 6-10 0 - 5 WBC/hpf   Bacteria, UA RARE (A) NONE SEEN   Squamous Epithelial / HPF 0-5 0 - 5 /HPF   Mucus PRESENT    MAU Course  Procedures LR Prolonged EFM  MDM Prenatal records reviewed: Pregnancy complicated by AMA, twins, twin B with VSD and marginal CI, elevated T21 risk, FGR, anemia, and hx of pp eclampsia. After prolonged EFM and IVF Baby B with occasional late decel, rec BMZ and admit per MFM. 1720: Dr. Su Hilt notified of presentation, clinical findings, and recs. Plan for admit.   Assessment and Plan  [redacted] weeks gestation Didi twins Admit to [facility] per Dr. Fernande Boyden, CNM 08/07/2022, 5:41 PM

## 2022-08-07 NOTE — Procedures (Signed)
Monica Green Sep 27, 1985 [redacted]w[redacted]d   Fetus B Non-Stress Test Interpretation for 08/07/22  Indication: IUGR  Fetal Heart Rate Fetus B Mode: External Baseline Rate (B): 130 BPM Variability: Moderate Accelerations: None Decelerations: Variable  Uterine Activity Mode: Palpation, Toco Contraction Frequency (min): occ with ui Contraction Duration (sec): 50-120 Contraction Quality: Mild Resting Tone Palpated: Relaxed Resting Time: Adequate  Interpretation (Baby B - Fetal Testing) Nonstress Test Interpretation (Baby B): Non-reactive Overall Impression (Baby B): Non-reassuring (Variable decel FHR decreased to 90s after uc) Comments (Baby B): Dr. Darra Lis reviewed tracing. Patient sent to MAU for fluid hydration and prolonged fetal monitoring.  Monica Green [birth year] [redacted]w[redacted]d  Fetus A Non-Stress Test Interpretation for 08/07/22  Indication: IUGR  Fetal Heart Rate A Mode: External Baseline Rate (A): 140 bpm Variability: Moderate Accelerations: 15 x 15 Decelerations: None Multiple birth?: Yes  Uterine Activity Mode: Palpation, Toco Contraction Frequency (min): occ with ui Contraction Duration (sec): 50-120 Contraction Quality: Mild Resting Tone Palpated: Relaxed Resting Time: Adequate  Interpretation (Fetal Testing) Nonstress Test Interpretation: Reactive Overall Impression: Reassuring for gestational age Comments: Dr. Darra Lis reviewed tracing

## 2022-08-07 NOTE — H&P (Signed)
Monica Green is a 37 y.o. female, G2P1001, IUP at 58 weeks with DiDi pregnancy, presenting for preterm contraction (S/P procardia 10mg  in MAU) and fetal decel noted in Twin B (which appears ot have resolved). H/O UC with increased BM over this past week, h/o Eclampsia ni previous pregnancy, was placed on keppra but does not take.  Pt endorse + Fm. Denies vaginal leakage. Denies vaginal bleeding.  I did not see or assess this pt, HP written off verbal PE with Dr Su Hilt.  Prenatal Problem: abnormal chromosomal and genetic finding on antenatal screening of mother (Elevated Trisomy 21 risk for this pregnancy.  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  Declined amnio. on twin B) advanced maternal age gravida Chronic  anemia (Hgb-9.1 at current NOB, Rx for Fe.  Prior pregnancy 6.6 at NOB, referred to Hematology., had IV Venofer infusions and  2 U PRBC.  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  Seeing peds cardiology at [facility] in 3rd trimester for recommendations for place of delivery.  OK to deliver at [facility].) dichorionic diamniotic twin pregnancy Doppler studies abnormal (Twin B with intermittent absent end diastolic flow at 32 weeks, close monitoring.  Frequency of testing increased to 2x/week at 33 weeks.) fetal growth restriction history of pre-eclampsia (last pregnancy, then eclampsia PP. Seizure 10 days PP, placed on Keppra. 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) ulcerative colitis (Dx 2017, no meds, followed at [facility]) vitamin D deficiency (Noted at NOB, rx'd supplementation, recheck PP.)  Patient Active Problem List   Diagnosis Date Noted   Fetal heart rate decelerations affecting management of mother 08/07/2022   Preterm contractions 08/07/2022   Dichorionic  diamniotic twin pregnancy, antepartum 07/24/2022   Abnormal maternal serum screening test 07/24/2022   Eclampsia 12/04/2020   Normal postpartum course 11/26/2020   Term newborn delivered vaginally, current hospitalization 11/24/2020   SVD (8/24) 11/24/2020   Pre-eclampsia in third trimester 11/23/2020   AMA (advanced maternal age) primigravida 35+, third trimester 11/23/2020   IUGR (intrauterine growth restriction) affecting care of mother 11/23/2020   Iron deficiency anemia 07/29/2020   Thrombocytosis 03/03/2020   Ulcerative colitis (HCC) 04/08/2015     Active Ambulatory Problems    Diagnosis Date Noted   Iron deficiency anemia 07/29/2020   Ulcerative colitis (HCC) 04/08/2015   Thrombocytosis 03/03/2020   Pre-eclampsia in third trimester 11/23/2020   AMA (advanced maternal age) primigravida 35+, third trimester 11/23/2020   IUGR (intrauterine growth restriction) affecting care of mother 11/23/2020   Term newborn delivered vaginally, current hospitalization 11/24/2020   SVD (8/24) 11/24/2020   Normal postpartum course 11/26/2020   Eclampsia 12/04/2020   Dichorionic diamniotic twin pregnancy, antepartum 07/24/2022   Abnormal maternal serum screening test 07/24/2022   Resolved Ambulatory Problems    Diagnosis Date Noted   Microcytic hypochromic anemia 01/05/2015   Symptomatic anemia 01/05/2015   Ureterolithiasis 01/06/2015   Bacterial vaginosis 01/06/2015   AP (abdominal pain) 03/21/2015   Bloody diarrhea    Generalized abdominal pain    Hypokalemia    Rectal bleed 03/21/2015   Protein-calorie malnutrition, severe 03/22/2015   Symptomatic anemia 02/17/2020   Hypokalemia 11/04/2020   Gestational hypertension 11/05/2020   Past Medical History:  Diagnosis Date   Anemia    Colitis  IDA (iron deficiency anemia)    Pre-eclampsia       Medications Prior to Admission  Medication Sig Dispense Refill Last Dose   iron polysaccharides (NIFEREX) 150 MG capsule Take 1  capsule (150 mg total) by mouth daily. 30 capsule 5 08/06/2022   potassium chloride SA (KLOR-CON M) 20 MEQ tablet Take 1 tablet (20 mEq total) by mouth 2 (two) times daily. 14 tablet 0 Past Week   Cyanocobalamin (B-12) 1000 MCG SUBL Place 1,000 mcg under the tongue daily. 30 tablet 11 08/04/2022   Prenatal Vit-Fe Fumarate-FA (PRENATAL MULTIVITAMIN) TABS tablet Take 1 tablet by mouth daily at 12 noon. 60 tablet 3 08/05/2022   VITAMIN D3 50 MCG (2000 UT) capsule Take 2 capsules by mouth daily.       Past Medical History:  Diagnosis Date   Anemia    Colitis    IDA (iron deficiency anemia)    Pre-eclampsia    Ulcerative colitis (HCC)      No current facility-administered medications on file prior to encounter.   Current Outpatient Medications on File Prior to Encounter  Medication Sig Dispense Refill   iron polysaccharides (NIFEREX) 150 MG capsule Take 1 capsule (150 mg total) by mouth daily. 30 capsule 5   potassium chloride SA (KLOR-CON M) 20 MEQ tablet Take 1 tablet (20 mEq total) by mouth 2 (two) times daily. 14 tablet 0   Cyanocobalamin (B-12) 1000 MCG SUBL Place 1,000 mcg under the tongue daily. 30 tablet 11   Prenatal Vit-Fe Fumarate-FA (PRENATAL MULTIVITAMIN) TABS tablet Take 1 tablet by mouth daily at 12 noon. 60 tablet 3   VITAMIN D3 50 MCG (2000 UT) capsule Take 2 capsules by mouth daily.       No Known Allergies  History of present pregnancy: Pt Info/Preference:  Screening/Consents:  Labs:   EDD: Estimated Date of Delivery: 09/18/22  Establised: Patient's last menstrual period was 12/06/2021.  Anatomy Scan: Date: 05/03/2022 Placenta Location:  A: posterior B: VSD, marginal cord insertion Genetic Screen: Panoroma:Twin A WNL, B T21 risk AFP:  First Tri: Quad:  Office: ccob             First PNV: 17 weeks Blood Type  B+  Language: english Last PNV: 33.2 weeks Rhogam    Flu Vaccine:  declined   Antibody  Neg  TDaP vaccine declined   GTT: Early: 5.5 Third Trimester: 78  Feeding  Plan: ??? BTL: ??? Rubella:  Immune  Contraception: ??? VBAC: No RPR:   nr  Circumcision: ???   HBsAg:  neg  Pediatrician:  ???   HIV:   neg  Prenatal Classes: No Additional [place]: Yes today see [place] report.  GBS:  Pending(For PCN allergy, check sensitivities)       Chlamydia: neg    MFM Referral/Consult:  GC: neg  Support Person: parnter   PAP: ???  Pain Management: ??? Neonatologist Referral:  Hgb Electrophoresis:  AA  Birth Plan: PCS   Hgb NOB: 9.1    28W: 8.4   OB History     Gravida  2   Para  1   Term  1   Preterm      AB      Living  1      SAB      IAB      Ectopic      Multiple  0   Live Births  1          Past Medical History:  Diagnosis  Date   Anemia    Colitis    IDA (iron deficiency anemia)    Pre-eclampsia    Ulcerative colitis (HCC)    Past Surgical History:  Procedure Laterality Date   FLEXIBLE SIGMOIDOSCOPY N/A 03/23/2015   Procedure: FLEXIBLE SIGMOIDOSCOPY;  Surgeon: Carman Ching, MD;  Location: [facility] ENDOSCOPY;  Service: Endoscopy;  Laterality: N/A;   Family History: family history includes Asthma in her brother and sister; Cancer in her father; Colon cancer in her father; Hypertension in her father and paternal grandfather. Social History:  reports that she has never smoked. She has never used smokeless tobacco. She reports that she does not currently use alcohol. She reports that she does not use drugs.   Prenatal Transfer Tool  Maternal Diabetes: No Genetic Screening: Abnormal:  Results: Elevated risk of Trisomy 21 on twin B and VSD Maternal Ultrasounds/Referrals: IUGR and Cardiac defect Fetal Ultrasounds or other Referrals:  Fetal echo, Referred to Materal Fetal Medicine  Maternal Substance Abuse:  No Significant Maternal Medications:  None Significant Maternal Lab Results: Other:   ROS:  Review of Systems  Constitutional: Negative.   HENT: Negative.    Cardiovascular: Negative.   Gastrointestinal: Negative.   Genitourinary:  Negative.   Skin: Negative.   Neurological: Negative.      Physical Exam: BP 115/72 (BP Location: Right Arm)   Pulse (!) 108   Temp 98.1 F (36.7 C) (Oral)   Resp 18   Ht 5\' 5"  (1.651 m)   Wt 68.4 kg   LMP 12/06/2021   SpO2 100%   BMI 25.08 kg/m   Physical Exam Per MAU Assessment Vitals and nursing note reviewed.  Constitutional:      General: She is not in acute distress.    Appearance: Normal appearance.  HENT:     Head: Normocephalic and atraumatic.  Cardiovascular:     Rate and Rhythm: Normal rate.  Pulmonary:     Effort: Pulmonary effort is normal. No respiratory distress.  Musculoskeletal:        General: Normal range of motion.     Cervical back: Normal range of motion.  Neurological:     General: No focal deficit present.     Mental Status: She is alert and oriented to person, place, and time.  Psychiatric:        Mood and Affect: Mood normal.        Behavior: Behavior normal.    EFM-A: 155 bpm, mod variability, + accels, no decels EFM-B: 145 bpm, mod variability, + accels, occ late decels Toco: irreg  Labs: Results for orders placed or performed during the hospital encounter of 08/07/22 (from the past 24 hour(s))  Urinalysis, Routine w reflex microscopic -Urine, Clean Catch     Status: Abnormal   Collection Time: 08/07/22  3:59 PM  Result Value Ref Range   Color, Urine YELLOW YELLOW   APPearance CLEAR CLEAR   Specific Gravity, Urine 1.014 1.005 - 1.030   pH 6.0 5.0 - 8.0   Glucose, UA NEGATIVE NEGATIVE mg/dL   Hgb urine dipstick NEGATIVE NEGATIVE   Bilirubin Urine NEGATIVE NEGATIVE   Ketones, ur 80 (A) NEGATIVE mg/dL   Protein, ur 30 (A) NEGATIVE mg/dL   Nitrite NEGATIVE NEGATIVE   Leukocytes,Ua SMALL (A) NEGATIVE   RBC / HPF 0-5 0 - 5 RBC/hpf   WBC, UA 6-10 0 - 5 WBC/hpf   Bacteria, UA RARE (A) NONE SEEN   Squamous Epithelial / HPF 0-5 0 - 5 /HPF   Mucus  PRESENT     Imaging:  [place] MFM UA CORD DOPPLER  Result Date:  08/07/2022 ----------------------------------------------------------------------  OBSTETRICS REPORT                       (Signed Final 08/07/2022 12:21 pm) ---------------------------------------------------------------------- Patient Info  ID #:       [id number]                          D.O.B.:  May 03, 1985 (37 yrs)  Name:       Monica Green                       Visit Date: 08/07/2022 09:33 am              [place] ---------------------------------------------------------------------- Performed By  Attending:        Braxton Feathers DO       Secondary Phy.:   Nigel Bridgeman                                                             CNM  Performed By:     Marcellina Millin       Address:          [facility]                    [facility]                                                             Obstetrics &                                                             Gynecology                                                             [address]                                                             [address]  [place], [place]                                                             [address]  Referred By:      Mittie Bodo            Location[facility]         [facility]  Ref. Address:     [facility] ---------------------------------------------------------------------- Orders  #  Description                           Code        Ordered By  1  [place] MFM UA CORD DOPPLER                [id number]    RAVI SHANKAR  2  [place] MFM UA ADDL GEST                   76820.01    RAVI SHANKAR  3  [place] MFM FETAL BPP                      76818.5     RAVI SHANKAR     W/NONSTRESS  4  [place] MFM FETAL BPP                       65784.6     RAVI [facility]     W/NONSTRESS ADD'L GEST ----------------------------------------------------------------------  #  Order #                     Accession #                Episode #  1  [id number]                   [id number]                 [id number]  2  [id number]                   [id number]  [id number]  3  [id number]                   [id number]                 [id number]  4  [id number]                   [id number]                 [id number] ---------------------------------------------------------------------- Indications  Maternal care for known or suspected poor      O36.5931  fetal growth, third trimester, fetus 1 IUGR  Maternal care for known or suspected poor      O36.5932  fetal growth, third trimester, fetus 2 IUGR  Twin pregnancy, di/di, third trimester         O30.043  Anemia during pregnancy in third trimester     O15.013  Twin B: UTDA1, hypoplastic nasal bone,         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, EIF; Twin A: EIF  Medical complication of pregnancy              O26.90  (Thrombocytosis)  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  management of mother in third trimester  (Twin B)  Abnormal biochemical screen (quad) for         O28.9  Trisomy 22  Advanced maternal age multigravida 51+,        O84.523  third trimester (37 yrs)  Poor obstetric history: Previous               O09.299  preeclampsia / eclampsia/gestational HTN  [redacted] weeks gestation of pregnancy                Z3A.34 ---------------------------------------------------------------------- Vital Signs  BP:          101/63 ---------------------------------------------------------------------- Fetal Evaluation (Fetus A)  Num Of Fetuses:         2  Fetal Heart Rate(bpm):  143  Cardiac Activity:       Observed  Fetal Lie:              Maternal right side  Presentation:           Cephalic  Placenta:               Posterior  P. Cord Insertion:      Previously visualized  Membrane Desc:      Dividing Membrane seen - Dichorionic.   Amniotic Fluid  AFI FV:      Within normal limits                              Largest Pocket(cm)                              4.24 ---------------------------------------------------------------------- Biophysical Evaluation (Fetus A)  Amniotic F.V:   Pocket => 2 cm             F. Tone:        Observed  F. Movement:    Observed                   N.S.T:          Reactive  F. Breathing:   Observed                   Score:  10/10 ---------------------------------------------------------------------- OB History  Blood Type:   B+  Maternal Racial/Ethnic Group:   Black (non-Hispanic)  Gravidity:    2         Term:   1        Prem:   0        SAB:   0  TOP:          0       Ectopic:  0        Living: 1 ---------------------------------------------------------------------- Gestational Age (Fetus A)  LMP:           34w 6d        Date:  12/06/21                   EDD:   09/12/22  Best:          34w 0d     Det. By:  Previous Ultrasound      EDD:   09/18/22                                      (04/06/22) ---------------------------------------------------------------------- Doppler - Fetal Vessels (Fetus A)  Umbilical Artery   S/D     %tile      RI    %tile      PI    %tile     PSV    ADFV    RDFV                                                     (cm/s)    1.8        7    0.44    < 2.5    0.59      3.1    44.59      No      No ---------------------------------------------------------------------- Fetal Evaluation (Fetus B)  Num Of Fetuses:         2  Fetal Heart Rate(bpm):  140  Cardiac Activity:       Observed  Fetal Lie:              Maternal left side  Presentation:           Breech  Placenta:               Anterior  P. Cord Insertion:      Marginal insertion prev seen  Membrane Desc:      Dividing Membrane seen - Dichorionic.  Amniotic Fluid  AFI FV:      Within normal limits                              Largest Pocket(cm)                              4.75  ---------------------------------------------------------------------- Biophysical Evaluation (Fetus B)  Amniotic F.V:   Pocket => 2 cm             F. Tone:        Observed  F. Movement:    Observed  N.S.T:          Nonreactive  F. Breathing:   Observed                   Score:          8/10 ---------------------------------------------------------------------- Gestational Age (Fetus B)  LMP:           34w 6d        Date:  12/06/21                   EDD:   09/12/22  Best:          34w 0d     Det. By:  Previous Ultrasound      EDD:   09/18/22                                      (04/06/22) ---------------------------------------------------------------------- Doppler - Fetal Vessels (Fetus B)  Umbilical Artery   S/D     %tile      RI    %tile      PI    %tile     PSV    ADFV    RDFV                                                     (cm/s)   5.75   > 97.5    0.83   > 97.5    1.49   > 97.5     35.5      No      No ---------------------------------------------------------------------- Comments  Follow-up ultrasound for di-di twins.  Sonographic findings  Di-di intrauterine pregnancy at 34w 0d  Fetal cardiac activity: A: Observed, B: Observed.  Presentation: A: Cephalic, B: Breech.  Internal anatomy: appears normal except for: A: EIF; B: UTD  (resolved), hypoplastic NB, AVSD, EIF, Marginal CI.  Fetal biometry (estimated fetal weight):  percentile.  Amniotic fluid volume: A: Within normal limits, B: Within  normal limits.  Placentas: A: Posterior, B: Anterior.  UA dopplers: A: normal, B: elevated  BPP: A: 10/10, B: 8/10 - late decel with variable decels noted  Recommendations  -Sent to MAU for 2-4 hours of monitoring due to late decel of  twin B. If there are no further decels she can be discharged  and she will f/u on 5/11. If there is further concern consider  betamethasone and delivery. Please reach out to MFM with  any concerns.  -Notified Dr. Ladean Raya (OB attending)  ----------------------------------------------------------------------                 Braxton Feathers, DO Electronically Signed Final Report   08/07/2022 12:21 pm ----------------------------------------------------------------------  [place] MFM UA ADDL GEST  Result Date: 08/07/2022 ----------------------------------------------------------------------  OBSTETRICS REPORT                       (Signed Final 08/07/2022 12:21 pm) ---------------------------------------------------------------------- Patient Info  ID #:       [id number]                          D.O.B.:  [birth year] (37 yrs)  Name:       Monica Green  Visit Date: 08/07/2022 09:33 am              [place] ---------------------------------------------------------------------- Performed By  Attending:        Braxton Feathers DO       Secondary Phy.:   Nigel Bridgeman                                                             CNM  Performed By:     Marcellina Millin       Address:          [facility]                                                             Obstetrics &                                                             Gynecology                                                             [address]                                                             [address]                                                             [place], [place]                                                             [address]  Referred By:      Mittie Bodo  [facility]         [facility]  Ref. Address:     [facility] ---------------------------------------------------------------------- Orders  #  Description                           Code         Ordered By  1  [place] MFM UA CORD DOPPLER                76820.02    RAVI SHANKAR  2  [place] MFM UA ADDL GEST                   76820.01    RAVI SHANKAR  3  [place] MFM FETAL BPP                      76818.5     RAVI SHANKAR     W/NONSTRESS  4  [place] MFM FETAL BPP                      09811.9     RAVI SHANKAR     W/NONSTRESS ADD'L GEST ----------------------------------------------------------------------  #  Order #                     Accession #                Episode #  1  [id number]                   [id number]                 [id number]  2  [id number]                   [id number]                 [id number]  3  [id number]                   [id number]                 [id number]  4  [id number]                   [id number]                 [id number] ---------------------------------------------------------------------- Indications  Maternal care for known or suspected poor      O36.5931  fetal growth, third trimester, fetus 1 IUGR  Maternal care for known or suspected poor      O36.5932  fetal growth, third trimester, fetus 2 IUGR  Twin pregnancy, di/di, third trimester         O30.043  Anemia during pregnancy in third trimester     O52.013  Twin B: UTDA1, hypoplastic nasal bone,         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, EIF; Twin A: EIF  Medical complication of pregnancy              O26.90  (Thrombocytosis)  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  management of mother in third trimester  (Twin B)  Abnormal biochemical screen (quad) for         O28.9  Trisomy 76  Advanced maternal age multigravida 82+,        O53.523  third trimester (37 yrs)  Poor obstetric history: Previous               O09.299  preeclampsia / eclampsia/gestational HTN  [redacted] weeks gestation of pregnancy                Z3A.34 ---------------------------------------------------------------------- Vital Signs  BP:          101/63 ---------------------------------------------------------------------- Fetal Evaluation (Fetus A)  Num Of Fetuses:         2  Fetal Heart  Rate(bpm):  143  Cardiac Activity:       Observed  Fetal Lie:              Maternal right side  Presentation:           Cephalic  Placenta:               Posterior  P. Cord Insertion:      Previously visualized  Membrane Desc:      Dividing Membrane seen - Dichorionic.  Amniotic Fluid  AFI FV:      Within normal limits                              Largest Pocket(cm)                              4.24 ---------------------------------------------------------------------- Biophysical Evaluation (Fetus A)  Amniotic F.V:   Pocket => 2 cm             F. Tone:        Observed  F. Movement:    Observed                   N.S.T:          Reactive  F. Breathing:   Observed                   Score:          10/10 ---------------------------------------------------------------------- OB History  Blood Type:   B+  Maternal Racial/Ethnic Group:   Black (non-Hispanic)  Gravidity:    2         Term:   1        Prem:   0        SAB:   0  TOP:          0       Ectopic:  0  Living: 1 ---------------------------------------------------------------------- Gestational Age (Fetus A)  LMP:           34w 6d        Date:  12/06/21                   EDD:   09/12/22  Best:          34w 0d     Det. By:  Previous Ultrasound      EDD:   09/18/22                                      (04/06/22) ---------------------------------------------------------------------- Doppler - Fetal Vessels (Fetus A)  Umbilical Artery   S/D     %tile      RI    %tile      PI    %tile     PSV    ADFV    RDFV                                                     (cm/s)    1.8        7    0.44    < 2.5    0.59      3.1    44.59      No      No ---------------------------------------------------------------------- Fetal Evaluation (Fetus B)  Num Of Fetuses:         2  Fetal Heart Rate(bpm):  140  Cardiac Activity:       Observed  Fetal Lie:              Maternal left side  Presentation:           Breech  Placenta:               Anterior  P. Cord Insertion:      Marginal  insertion prev seen  Membrane Desc:      Dividing Membrane seen - Dichorionic.  Amniotic Fluid  AFI FV:      Within normal limits                              Largest Pocket(cm)                              4.75 ---------------------------------------------------------------------- Biophysical Evaluation (Fetus B)  Amniotic F.V:   Pocket => 2 cm             F. Tone:        Observed  F. Movement:    Observed                   N.S.T:          Nonreactive  F. Breathing:   Observed                   Score:          8/10 ---------------------------------------------------------------------- Gestational Age (Fetus B)  LMP:           34w 6d        Date:  12/06/21  EDD:   09/12/22  Best:          34w 0d     Det. By:  Previous Ultrasound      EDD:   09/18/22                                      (04/06/22) ---------------------------------------------------------------------- Doppler - Fetal Vessels (Fetus B)  Umbilical Artery   S/D     %tile      RI    %tile      PI    %tile     PSV    ADFV    RDFV                                                     (cm/s)   5.75   > 97.5    0.83   > 97.5    1.49   > 97.5     35.5      No      No ---------------------------------------------------------------------- Comments  Follow-up ultrasound for di-di twins.  Sonographic findings  Di-di intrauterine pregnancy at 34w 0d  Fetal cardiac activity: A: Observed, B: Observed.  Presentation: A: Cephalic, B: Breech.  Internal anatomy: appears normal except for: A: EIF; B: UTD  (resolved), hypoplastic NB, AVSD, EIF, Marginal CI.  Fetal biometry (estimated fetal weight):  percentile.  Amniotic fluid volume: A: Within normal limits, B: Within  normal limits.  Placentas: A: Posterior, B: Anterior.  UA dopplers: A: normal, B: elevated  BPP: A: 10/10, B: 8/10 - late decel with variable decels noted  Recommendations  -Sent to MAU for 2-4 hours of monitoring due to late decel of  twin B. If there are no further decels she can be discharged   and she will f/u on 5/11. If there is further concern consider  betamethasone and delivery. Please reach out to MFM with  any concerns.  -Notified Dr. Ladean Raya (OB attending) ----------------------------------------------------------------------                 Braxton Feathers, DO Electronically Signed Final Report   08/07/2022 12:21 pm ----------------------------------------------------------------------  [place] MFM FETAL BPP W/NONSTRESS ADD'L GEST  Result Date: 08/07/2022 ----------------------------------------------------------------------  OBSTETRICS REPORT                       (Signed Final 08/07/2022 12:21 pm) ---------------------------------------------------------------------- Patient Info  ID #:       [id number]                          D.O.B.:  01/14/1986 (37 yrs)  Name:       Monica Green                       Visit Date: 08/07/2022 09:33 am              [place] ---------------------------------------------------------------------- Performed By  Attending:        Braxton Feathers DO       Secondary Phy.:   Nigel Bridgeman  CNM  Performed By:     Marcellina Millin       Address:          [facility]                                                             Obstetrics &                                                             Gynecology                                                             [address]                                                             [address]                                                             [place], [place]                                                             [address]  Referred By:      Mittie Bodo            Location[facility]         [facility]  Ref. Address:     [facility] ---------------------------------------------------------------------- Orders  #  Description                           Code        Ordered By  1  [place] MFM UA CORD DOPPLER                76820.02    RAVI SHANKAR  2  [place] MFM UA ADDL GEST                   76820.01    RAVI SHANKAR  3  [place] MFM FETAL BPP                      76818.5     RAVI SHANKAR     W/NONSTRESS  4  [place] MFM FETAL BPP                      96045.4     RAVI [facility]     W/NONSTRESS ADD'L GEST ----------------------------------------------------------------------  #  Order #                     Accession #                Episode #  1  [id number]                   [id number]                 [id number]  2  [id number]                   [id number]                 [id number]  3  [id number]                   [id number]                 [id number]  4  [id number]                   [id number]                 [id number] ---------------------------------------------------------------------- Indications  Maternal care for known or suspected poor      O36.5931  fetal growth, third trimester, fetus 1 IUGR  Maternal care for known or suspected poor      O36.5932  fetal growth, third trimester, fetus 2 IUGR  Twin pregnancy, di/di, third trimester         O30.043  Anemia during pregnancy in third trimester     O81.013  Twin B: UTDA1, hypoplastic nasal bone,         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, EIF; Twin A: EIF  Medical complication of pregnancy              O26.90  (Thrombocytosis)  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  management of mother in third trimester  (Twin B)  Abnormal biochemical screen (  quad) for         O28.9  Trisomy 48  Advanced maternal age multigravida 73+,        O37.523  third trimester (37 yrs)  Poor obstetric history: Previous               O09.299  preeclampsia / eclampsia/gestational HTN  [redacted] weeks gestation of pregnancy                Z3A.34  ---------------------------------------------------------------------- Vital Signs  BP:          101/63 ---------------------------------------------------------------------- Fetal Evaluation (Fetus A)  Num Of Fetuses:         2  Fetal Heart Rate(bpm):  143  Cardiac Activity:       Observed  Fetal Lie:              Maternal right side  Presentation:           Cephalic  Placenta:               Posterior  P. Cord Insertion:      Previously visualized  Membrane Desc:      Dividing Membrane seen - Dichorionic.  Amniotic Fluid  AFI FV:      Within normal limits                              Largest Pocket(cm)                              4.24 ---------------------------------------------------------------------- Biophysical Evaluation (Fetus A)  Amniotic F.V:   Pocket => 2 cm             F. Tone:        Observed  F. Movement:    Observed                   N.S.T:          Reactive  F. Breathing:   Observed                   Score:          10/10 ---------------------------------------------------------------------- OB History  Blood Type:   B+  Maternal Racial/Ethnic Group:   Black (non-Hispanic)  Gravidity:    2         Term:   1        Prem:   0        SAB:   0  TOP:          0       Ectopic:  0        Living: 1 ---------------------------------------------------------------------- Gestational Age (Fetus A)  LMP:           34w 6d        Date:  12/06/21                   EDD:   09/12/22  Best:          34w 0d     Det. By:  Previous Ultrasound      EDD:   09/18/22                                      (04/06/22) ---------------------------------------------------------------------- Doppler - Fetal Vessels (Fetus A)  Umbilical Artery   S/D     %tile      RI    %tile      PI    %tile     PSV    ADFV    RDFV                                                     (cm/s)    1.8        7    0.44    < 2.5    0.59      3.1    44.59      No      No ---------------------------------------------------------------------- Fetal Evaluation  (Fetus B)  Num Of Fetuses:         2  Fetal Heart Rate(bpm):  140  Cardiac Activity:       Observed  Fetal Lie:              Maternal left side  Presentation:           Breech  Placenta:               Anterior  P. Cord Insertion:      Marginal insertion prev seen  Membrane Desc:      Dividing Membrane seen - Dichorionic.  Amniotic Fluid  AFI FV:      Within normal limits                              Largest Pocket(cm)                              4.75 ---------------------------------------------------------------------- Biophysical Evaluation (Fetus B)  Amniotic F.V:   Pocket => 2 cm             F. Tone:        Observed  F. Movement:    Observed                   N.S.T:          Nonreactive  F. Breathing:   Observed                   Score:          8/10 ---------------------------------------------------------------------- Gestational Age (Fetus B)  LMP:           34w 6d        Date:  12/06/21                   EDD:   09/12/22  Best:          34w 0d     Det. By:  Previous Ultrasound      EDD:   09/18/22                                      (04/06/22) ---------------------------------------------------------------------- Doppler - Fetal Vessels (Fetus B)  Umbilical Artery   S/D     %tile      RI    %tile      PI    %tile     PSV  ADFV    RDFV                                                     (cm/s)   5.75   > 97.5    0.83   > 97.5    1.49   > 97.5     35.5      No      No ---------------------------------------------------------------------- Comments  Follow-up ultrasound for di-di twins.  Sonographic findings  Di-di intrauterine pregnancy at 34w 0d  Fetal cardiac activity: A: Observed, B: Observed.  Presentation: A: Cephalic, B: Breech.  Internal anatomy: appears normal except for: A: EIF; B: UTD  (resolved), hypoplastic NB, AVSD, EIF, Marginal CI.  Fetal biometry (estimated fetal weight):  percentile.  Amniotic fluid volume: A: Within normal limits, B: Within  normal limits.  Placentas: A: Posterior, B: Anterior.   UA dopplers: A: normal, B: elevated  BPP: A: 10/10, B: 8/10 - late decel with variable decels noted  Recommendations  -Sent to MAU for 2-4 hours of monitoring due to late decel of  twin B. If there are no further decels she can be discharged  and she will f/u on 5/11. If there is further concern consider  betamethasone and delivery. Please reach out to MFM with  any concerns.  -Notified Dr. Ladean Raya (OB attending) ----------------------------------------------------------------------                 Braxton Feathers, DO Electronically Signed Final Report   08/07/2022 12:21 pm ----------------------------------------------------------------------  [place] MFM FETAL BPP W/NONSTRESS  Result Date: 08/07/2022 ----------------------------------------------------------------------  OBSTETRICS REPORT                       (Signed Final 08/07/2022 12:21 pm) ---------------------------------------------------------------------- Patient Info  ID #:       [id number]                          D.O.B.:  [birth year] (37 yrs)  Name:       Monica Green                       Visit Date: 08/07/2022 09:33 am              [place] ---------------------------------------------------------------------- Performed By  Attending:        Braxton Feathers DO       Secondary Phy.:   Nigel Bridgeman                                                             CNM  Performed By:     Marcellina Millin       Address:          [facility]                    [facility]  Obstetrics &                                                             Gynecology                                                             [address]                                                             [address]                                                             [place], [place]                                                             [address]  Referred By:       Mittie Bodo            Location[facility]         [facility]  Ref. Address:     [facility] ----------------------------------------------------------------------  Orders  #  Description                           Code        Ordered By  1  [place] MFM UA CORD DOPPLER                76820.02    RAVI SHANKAR  2  [place] MFM UA ADDL GEST                   76820.01    RAVI SHANKAR  3  [place] MFM FETAL BPP                      76818.5     RAVI SHANKAR     W/NONSTRESS  4  [place] MFM FETAL BPP                      81191.4     RAVI [facility]     W/NONSTRESS ADD'L GEST ----------------------------------------------------------------------  #  Order #                     Accession #                Episode #  1  [id number]                   [id number]                 [id number]  2  [id number]                   [id number]                 [id number]  3  [id number]                   [id number]                 [id number]  4  [id number]                   [id number]                 [id number] ---------------------------------------------------------------------- Indications  Maternal care for known or suspected poor      O36.5931  fetal growth, third trimester, fetus 1 IUGR  Maternal care for known or suspected poor      O36.5932  fetal growth, third trimester, fetus 2 IUGR  Twin pregnancy, di/di, third trimester         O30.043  Anemia during pregnancy in third trimester     O58.013  Twin B: UTDA1, hypoplastic nasal bone,         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, EIF; Twin A: EIF  Medical complication of pregnancy              O26.90  (Thrombocytosis)  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  management of mother in third trimester  (Twin B)  Abnormal biochemical screen (quad) for         O28.9  Trisomy 76  Advanced maternal age multigravida 7+,         O74.523  third trimester (37 yrs)  Poor obstetric history: Previous               O09.299  preeclampsia / eclampsia/gestational HTN  [redacted] weeks gestation of pregnancy  Z3A.34 ---------------------------------------------------------------------- Vital Signs  BP:          101/63 ---------------------------------------------------------------------- Fetal Evaluation (Fetus A)  Num Of Fetuses:         2  Fetal Heart Rate(bpm):  143  Cardiac Activity:       Observed  Fetal Lie:              Maternal right side  Presentation:           Cephalic  Placenta:               Posterior  P. Cord Insertion:      Previously visualized  Membrane Desc:      Dividing Membrane seen - Dichorionic.  Amniotic Fluid  AFI FV:      Within normal limits                              Largest Pocket(cm)                              4.24 ---------------------------------------------------------------------- Biophysical Evaluation (Fetus A)  Amniotic F.V:   Pocket => 2 cm             F. Tone:        Observed  F. Movement:    Observed                   N.S.T:          Reactive  F. Breathing:   Observed                   Score:          10/10 ---------------------------------------------------------------------- OB History  Blood Type:   B+  Maternal Racial/Ethnic Group:   Black (non-Hispanic)  Gravidity:    2         Term:   1        Prem:   0        SAB:   0  TOP:          0       Ectopic:  0        Living: 1 ---------------------------------------------------------------------- Gestational Age (Fetus A)  LMP:           34w 6d        Date:  12/06/21                   EDD:   09/12/22  Best:          34w 0d     Det. By:  Previous Ultrasound      EDD:   09/18/22                                      (04/06/22) ---------------------------------------------------------------------- Doppler - Fetal Vessels (Fetus A)  Umbilical Artery   S/D     %tile      RI    %tile      PI    %tile     PSV    ADFV    RDFV                                                      (  cm/s)    1.8        7    0.44    < 2.5    0.59      3.1    44.59      No      No ---------------------------------------------------------------------- Fetal Evaluation (Fetus B)  Num Of Fetuses:         2  Fetal Heart Rate(bpm):  140  Cardiac Activity:       Observed  Fetal Lie:              Maternal left side  Presentation:           Breech  Placenta:               Anterior  P. Cord Insertion:      Marginal insertion prev seen  Membrane Desc:      Dividing Membrane seen - Dichorionic.  Amniotic Fluid  AFI FV:      Within normal limits                              Largest Pocket(cm)                              4.75 ---------------------------------------------------------------------- Biophysical Evaluation (Fetus B)  Amniotic F.V:   Pocket => 2 cm             F. Tone:        Observed  F. Movement:    Observed                   N.S.T:          Nonreactive  F. Breathing:   Observed                   Score:          8/10 ---------------------------------------------------------------------- Gestational Age (Fetus B)  LMP:           34w 6d        Date:  12/06/21                   EDD:   09/12/22  Best:          34w 0d     Det. By:  Previous Ultrasound      EDD:   09/18/22                                      (04/06/22) ---------------------------------------------------------------------- Doppler - Fetal Vessels (Fetus B)  Umbilical Artery   S/D     %tile      RI    %tile      PI    %tile     PSV    ADFV    RDFV                                                     (cm/s)   5.75   > 97.5    0.83   > 97.5    1.49   > 97.5     35.5      No  No ---------------------------------------------------------------------- Comments  Follow-up ultrasound for di-di twins.  Sonographic findings  Di-di intrauterine pregnancy at 34w 0d  Fetal cardiac activity: A: Observed, B: Observed.  Presentation: A: Cephalic, B: Breech.  Internal anatomy: appears normal except for: A: EIF; B: UTD  (resolved), hypoplastic NB, AVSD, EIF,  Marginal CI.  Fetal biometry (estimated fetal weight):  percentile.  Amniotic fluid volume: A: Within normal limits, B: Within  normal limits.  Placentas: A: Posterior, B: Anterior.  UA dopplers: A: normal, B: elevated  BPP: A: 10/10, B: 8/10 - late decel with variable decels noted  Recommendations  -Sent to MAU for 2-4 hours of monitoring due to late decel of  twin B. If there are no further decels she can be discharged  and she will f/u on 5/11. If there is further concern consider  betamethasone and delivery. Please reach out to MFM with  any concerns.  -Notified Dr. Ladean Raya (OB attending) ----------------------------------------------------------------------                 Braxton Feathers, DO Electronically Signed Final Report   08/07/2022 12:21 pm ----------------------------------------------------------------------  [place] MFM UA CORD DOPPLER  Result Date: 08/04/2022 ----------------------------------------------------------------------  OBSTETRICS REPORT                       (Signed Final 08/04/2022 12:48 pm) ---------------------------------------------------------------------- Patient Info  ID #:       [id number]                          D.O.B.:  07/11/1985 (37 yrs)  Name:       Monica Green                       Visit Date: 08/04/2022 08:49 am              [place] ---------------------------------------------------------------------- Performed By  Attending:        Braxton Feathers DO       Secondary Phy.:   Nigel Bridgeman                                                             CNM  Performed By:     Anabel Halon          Address:          [facility]                    [facility]                                                             Obstetrics &                                                             Gynecology  [address]                                                              [address]                                                             [place], [place]                                                             [address]  Referred By:      Mittie Bodo            Location:         [facility]  Ref. Address:     [facility] ---------------------------------------------------------------------- Orders  #  Description                           Code        Ordered By  1  [place] MFM UA CORD DOPPLER                76820.02    RAVI SHANKAR  2  [place] MFM UA ADDL GEST                   76820.01    RAVI SHANKAR  3  [place] MFM FETAL BPP  29528.4     RAVI SHANKAR     W/NONSTRESS  4  [place] MFM FETAL BPP                      13244.0     RAVI [facility]     W/NONSTRESS ADD'L GEST ----------------------------------------------------------------------  #  Order #                     Accession #                Episode #  1  [id number]                   [id number]                 [id number]  2  [id number]                   [id number]                 [id number]  3  [id number]                   [id number]                 [id number]  4  [id number]                   [id number]                 [id number] ---------------------------------------------------------------------- Indications  Maternal care for known or suspected poor      O36.5931  fetal growth, third trimester, fetus 1 IUGR  Maternal care for known or suspected poor      O36.5932  fetal growth, third trimester, fetus 2 IUGR  Twin pregnancy, di/di, third trimester         O30.043  Anemia during pregnancy in third trimester     O79.013  Twin B: UTDA1, hypoplastic nasal bone,         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, EIF; Twin A: EIF  Medical complication of pregnancy              O26.90  (Thrombocytosis)  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.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  management of mother in third trimester  (Twin B)  Abnormal biochemical screen (quad) for         O28.9  Trisomy 49  Advanced maternal age multigravida 61+,        O39.523  third trimester (37 yrs)  [redacted] weeks gestation of pregnancy                Z3A.33  Poor obstetric history: Previous               O09.299  preeclampsia / eclampsia/gestational HTN ---------------------------------------------------------------------- Fetal Evaluation (Fetus A)  Num Of Fetuses:         2  Fetal Heart Rate(bpm):  124  Cardiac Activity:       Observed  Presentation:           Cephalic  Placenta:               Posterior  P. Cord Insertion:      Previously visualized  Amniotic Fluid  AFI FV:      Within normal limits  AFI Sum(cm)     %Tile       Largest Pocket(cm)  6.91            < 3  6.89  RUQ(cm)  6.91 ---------------------------------------------------------------------- Biophysical Evaluation (Fetus A)  Amniotic F.V:   Pocket => 2 cm             F. Tone:        Observed  F. Movement:    Observed                   N.S.T:          Reactive  F. Breathing:   Observed                   Score:          10/10 ---------------------------------------------------------------------- OB History  Blood Type:   B+  Maternal Racial/Ethnic Group:   Black (non-Hispanic)  Gravidity:    2         Term:   1        Prem:   0        SAB:   0  TOP:          0       Ectopic:  0        Living: 1 ---------------------------------------------------------------------- Gestational Age (Fetus A)  LMP:           34w 3d        Date:  12/06/21                   EDD:   09/12/22  Best:          33w 4d     Det. By:  Previous Ultrasound      EDD:   09/18/22                                      (04/06/22) ---------------------------------------------------------------------- Anatomy (Fetus A)  Diaphragm:             Appears normal         Kidneys:                Appear normal  Stomach:               Appears normal, left   Bladder:                Appears  normal                         sided ---------------------------------------------------------------------- Doppler - Fetal Vessels (Fetus A)  Umbilical Artery   S/D     %tile      RI    %tile      PI    %tile     PSV    ADFV    RDFV                                                     (cm/s)   2.05       18    0.51       12     0.7       15    63.21      No      No ---------------------------------------------------------------------- Fetal Evaluation (Fetus B)  Num Of Fetuses:  2  Fetal Heart Rate(bpm):  135  Cardiac Activity:       Observed  Presentation:           Breech  Placenta:               Anterior  P. Cord Insertion:      Previously visualized  Amniotic Fluid  AFI FV:      Within normal limits                              Largest Pocket(cm)                              4.78 ---------------------------------------------------------------------- Biophysical Evaluation (Fetus B)  Amniotic F.V:   Pocket => 2 cm             F. Tone:        Observed  F. Movement:    Observed                   N.S.T:          Reactive  F. Breathing:   Observed                   Score:          10/10 ---------------------------------------------------------------------- Gestational Age (Fetus B)  LMP:           34w 3d        Date:  12/06/21                   EDD:   09/12/22  Best:          33w 4d     Det. By:  Previous Ultrasound      EDD:   09/18/22                                      (04/06/22) ---------------------------------------------------------------------- Anatomy (Fetus B)  Diaphragm:             Appears normal         Kidneys:                Appear normal  Stomach:               Appears normal, left   Bladder:                Appears normal                         sided ---------------------------------------------------------------------- Doppler - Fetal Vessels (Fetus B)  Umbilical Artery   S/D     %tile      RI    %tile      PI    %tile     PSV    ADFV    RDFV                                                      (cm/s)   6.43   > 97.5    0.84   > 97.5    1.69   > 97.5  37.23      No      No ---------------------------------------------------------------------- Cervix Uterus Adnexa  Cervix  Not visualized (advanced GA >24wks)  Uterus  No abnormality visualized.  Right Ovary  Within normal limits.  Left Ovary  Not visualized.  Cul De Sac  No free fluid seen.  Adnexa  No abnormality visualized ---------------------------------------------------------------------- Comments  Follow-up ultrasound for di-di twins. She has no concerns  today.  Sonographic findings  Di-di intrauterine pregnancy at 33w 4d  Fetal cardiac activity: A: Observed, B: Observed.  Presentation: A: Cephalic, B: Breech.  Internal anatomy: appears normal except for: A: EIF; B: UTD  (resolved), hypoplastic NB, AVSD, EIF, Marginal CI  Amniotic fluid volume: A: Within normal limits, B: Within  normal limits.  Placentas: A: Posterior, B: Anterior.  UA dopplers: A: normal, B: elevated  Recommendations  -Serial growth ultrasounds every 3-4 weeks until delivery  -Continue twice weekly testing and weekly UA dopplers  -Delivery around [redacted] weeks gestation. 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 (see fetal echo in Care  Everywhere) ----------------------------------------------------------------------                 Braxton Feathers, DO Electronically Signed Final Report   08/04/2022 12:48 pm ----------------------------------------------------------------------  [place] MFM UA ADDL GEST  Result Date: 08/04/2022 ----------------------------------------------------------------------  OBSTETRICS REPORT                       (Signed Final 08/04/2022 12:48 pm) ---------------------------------------------------------------------- Patient Info  ID #:       [id number]                          D.O.B.:  [birth year] (37 yrs)  Name:       Monica Green                       Visit Date: 08/04/2022 08:49 am              [place]  ---------------------------------------------------------------------- Performed By  Attending:        Braxton Feathers DO       Secondary Phy.:   Nigel Bridgeman                                                             CNM  Performed By:     Anabel Halon          Address:          [facility]                    RDMS                                                             Obstetrics &  Gynecology                                                             [address]                                                             [address]                                                             [place], [place]                                                             [address]  Referred By:      Mittie Bodo            Location[facility]         [facility]  Ref. Address:     [facility] ---------------------------------------------------------------------- Orders  #  Description                           Code        Ordered By  1  [place] MFM UA CORD DOPPLER  [address].60    RAVI SHANKAR  2  [place] MFM UA ADDL GEST                   76820.01    RAVI SHANKAR  3  [place] MFM FETAL BPP                      76818.5     RAVI SHANKAR     W/NONSTRESS  4  [place] MFM FETAL BPP                      45409.8     RAVI SHANKAR     W/NONSTRESS ADD'L GEST ----------------------------------------------------------------------  #  Order #                     Accession #                Episode #  1  [id number]                   [id number]                 [id number]  2  [id number]                   [id number]                 [id number]  3  [id number]                   [id number]                 [id number]  4  [id number]                    [id number]                 [id number] ---------------------------------------------------------------------- Indications  Maternal care for known or suspected poor      O36.5931  fetal growth, third trimester, fetus 1 IUGR  Maternal care for known or suspected poor      O36.5932  fetal growth, third trimester, fetus 2 IUGR  Twin pregnancy, di/di, third trimester         O30.043  Anemia during pregnancy in third trimester     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.013  Twin B: UTDA1, hypoplastic nasal bone,         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, EIF; Twin A: EIF  Medical complication of pregnancy              O26.90  (Thrombocytosis)  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  management of mother in third trimester  (Twin B)  Abnormal biochemical screen (quad) for         O28.9  Trisomy 33  Advanced maternal age multigravida 63+,        O45.523  third trimester (37 yrs)  [redacted] weeks gestation of pregnancy                Z3A.33  Poor obstetric history: Previous               O09.299  preeclampsia / eclampsia/gestational HTN ---------------------------------------------------------------------- Fetal Evaluation (Fetus A)  Num Of Fetuses:         2  Fetal Heart Rate(bpm):  124  Cardiac Activity:       Observed  Presentation:           Cephalic  Placenta:  Posterior  P. Cord Insertion:      Previously visualized  Amniotic Fluid  AFI FV:      Within normal limits  AFI Sum(cm)     %Tile       Largest Pocket(cm)  6.91            < 3         6.89  RUQ(cm)  6.91 ---------------------------------------------------------------------- Biophysical Evaluation (Fetus A)  Amniotic F.V:   Pocket => 2 cm             F. Tone:        Observed  F. Movement:    Observed                   N.S.T:          Reactive  F. Breathing:   Observed                   Score:          10/10 ---------------------------------------------------------------------- OB History  Blood Type:   B+  Maternal Racial/Ethnic Group:   Black (non-Hispanic)  Gravidity:    2          Term:   1        Prem:   0        SAB:   0  TOP:          0       Ectopic:  0        Living: 1 ---------------------------------------------------------------------- Gestational Age (Fetus A)  LMP:           34w 3d        Date:  12/06/21                   EDD:   09/12/22  Best:          33w 4d     Det. By:  Previous Ultrasound      EDD:   09/18/22                                      (04/06/22) ---------------------------------------------------------------------- Anatomy (Fetus A)  Diaphragm:             Appears normal         Kidneys:                Appear normal  Stomach:               Appears normal, left   Bladder:                Appears normal                         sided ---------------------------------------------------------------------- Doppler - Fetal Vessels (Fetus A)  Umbilical Artery   S/D     %tile      RI    %tile      PI    %tile     PSV    ADFV    RDFV                                                     (cm/s)  2.05       18    0.51       12     0.7       15    63.21      No      No ---------------------------------------------------------------------- Fetal Evaluation (Fetus B)  Num Of Fetuses:         2  Fetal Heart Rate(bpm):  135  Cardiac Activity:       Observed  Presentation:           Breech  Placenta:               Anterior  P. Cord Insertion:      Previously visualized  Amniotic Fluid  AFI FV:      Within normal limits                              Largest Pocket(cm)                              4.78 ---------------------------------------------------------------------- Biophysical Evaluation (Fetus B)  Amniotic F.V:   Pocket => 2 cm             F. Tone:        Observed  F. Movement:    Observed                   N.S.T:          Reactive  F. Breathing:   Observed                   Score:          10/10 ---------------------------------------------------------------------- Gestational Age (Fetus B)  LMP:           34w 3d        Date:  12/06/21                   EDD:   09/12/22  Best:           33w 4d     Det. By:  Previous Ultrasound      EDD:   09/18/22                                      (04/06/22) ---------------------------------------------------------------------- Anatomy (Fetus B)  Diaphragm:             Appears normal         Kidneys:                Appear normal  Stomach:               Appears normal, left   Bladder:                Appears normal                         sided ---------------------------------------------------------------------- Doppler - Fetal Vessels (Fetus B)  Umbilical Artery   S/D     %tile      RI    %tile      PI    %tile     PSV    ADFV    RDFV                                                     (  cm/s)   6.43   > 97.5    0.84   > 97.5    1.69   > 97.5    37.23      No      No ---------------------------------------------------------------------- Cervix Uterus Adnexa  Cervix  Not visualized (advanced GA >24wks)  Uterus  No abnormality visualized.  Right Ovary  Within normal limits.  Left Ovary  Not visualized.  Cul De Sac  No free fluid seen.  Adnexa  No abnormality visualized ---------------------------------------------------------------------- Comments  Follow-up ultrasound for di-di twins. She has no concerns  today.  Sonographic findings  Di-di intrauterine pregnancy at 33w 4d  Fetal cardiac activity: A: Observed, B: Observed.  Presentation: A: Cephalic, B: Breech.  Internal anatomy: appears normal except for: A: EIF; B: UTD  (resolved), hypoplastic NB, AVSD, EIF, Marginal CI  Amniotic fluid volume: A: Within normal limits, B: Within  normal limits.  Placentas: A: Posterior, B: Anterior.  UA dopplers: A: normal, B: elevated  Recommendations  -Serial growth ultrasounds every 3-4 weeks until delivery  -Continue twice weekly testing and weekly UA dopplers  -Delivery around [redacted] weeks gestation. 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 (see fetal echo in Care  Everywhere) ----------------------------------------------------------------------                  Braxton Feathers, DO Electronically Signed Final Report   08/04/2022 12:48 pm ----------------------------------------------------------------------  [place] MFM FETAL BPP W/NONSTRESS  Result Date: 08/04/2022 ----------------------------------------------------------------------  OBSTETRICS REPORT                       (Signed Final 08/04/2022 12:48 pm) ---------------------------------------------------------------------- Patient Info  ID #:       [id number]                          D.O.B.:  [birth year] (37 yrs)  Name:       Monica Green                       Visit Date: 08/04/2022 08:49 am              [place] ---------------------------------------------------------------------- Performed By  Attending:        Braxton Feathers DO       Secondary Phy.:   Nigel Bridgeman                                                             CNM  Performed By:     Anabel Halon          Address:          [facility]                    RDMS                                                             Obstetrics &  Gynecology                                                             [address]                                                             [address]                                                             [place], [place]                                                             [address]  Referred By:      Mittie Bodo            Location[facility]         [facility]  Ref. Address:     [facility] ---------------------------------------------------------------------- Orders  #  Description                           Code        Ordered By  1  [place] MFM UA CORD DOPPLER  40981.19    RAVI  SHANKAR  2  [place] MFM UA ADDL GEST                   76820.01    RAVI SHANKAR  3  [place] MFM FETAL BPP                      76818.5     RAVI SHANKAR     W/NONSTRESS  4  [place] MFM FETAL BPP                      14782.9     RAVI SHANKAR     W/NONSTRESS ADD'L GEST ----------------------------------------------------------------------  #  Order #                     Accession #                Episode #  1  [id number]                   [id number]                 [id number]  2  [id number]                   [id number]                 [id number]  3  [id number]                   [id number]                 [id number]  4  [id number]                   [id number]                 [id number] ---------------------------------------------------------------------- Indications  Maternal care for known or suspected poor      O36.5931  fetal growth, third trimester, fetus 1 IUGR  Maternal care for known or suspected poor      O36.5932  fetal growth, third trimester, fetus 2 IUGR  Twin pregnancy, di/di, third trimester         O30.043  Anemia during pregnancy in third trimester     O53.013  Twin B: UTDA1, hypoplastic nasal bone,         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, EIF; Twin A: EIF  Medical complication of pregnancy              O26.90  (Thrombocytosis)  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  management of mother in third trimester  (Twin B)  Abnormal biochemical screen (quad) for         O28.9  Trisomy 80  Advanced maternal age multigravida 66+,        O29.523  third trimester (37 yrs)  [redacted] weeks gestation of pregnancy                Z3A.33  Poor obstetric history: Previous               O09.299  preeclampsia / eclampsia/gestational HTN ---------------------------------------------------------------------- Fetal Evaluation (Fetus A)  Num Of Fetuses:         2  Fetal Heart Rate(bpm):  124  Cardiac Activity:       Observed  Presentation:           Cephalic  Placenta:  Posterior  P. Cord Insertion:      Previously visualized  Amniotic Fluid   AFI FV:      Within normal limits  AFI Sum(cm)     %Tile       Largest Pocket(cm)  6.91            < 3         6.89  RUQ(cm)  6.91 ---------------------------------------------------------------------- Biophysical Evaluation (Fetus A)  Amniotic F.V:   Pocket => 2 cm             F. Tone:        Observed  F. Movement:    Observed                   N.S.T:          Reactive  F. Breathing:   Observed                   Score:          10/10 ---------------------------------------------------------------------- OB History  Blood Type:   B+  Maternal Racial/Ethnic Group:   Black (non-Hispanic)  Gravidity:    2         Term:   1        Prem:   0        SAB:   0  TOP:          0       Ectopic:  0        Living: 1 ---------------------------------------------------------------------- Gestational Age (Fetus A)  LMP:           34w 3d        Date:  12/06/21                   EDD:   09/12/22  Best:          33w 4d     Det. By:  Previous Ultrasound      EDD:   09/18/22                                      (04/06/22) ---------------------------------------------------------------------- Anatomy (Fetus A)  Diaphragm:             Appears normal         Kidneys:                Appear normal  Stomach:               Appears normal, left   Bladder:                Appears normal                         sided ---------------------------------------------------------------------- Doppler - Fetal Vessels (Fetus A)  Umbilical Artery   S/D     %tile      RI    %tile      PI    %tile     PSV    ADFV    RDFV                                                     (cm/s)  2.05       18    0.51       12     0.7       15    63.21      No      No ---------------------------------------------------------------------- Fetal Evaluation (Fetus B)  Num Of Fetuses:         2  Fetal Heart Rate(bpm):  135  Cardiac Activity:       Observed  Presentation:           Breech  Placenta:               Anterior  P. Cord Insertion:      Previously visualized  Amniotic Fluid   AFI FV:      Within normal limits                              Largest Pocket(cm)                              4.78 ---------------------------------------------------------------------- Biophysical Evaluation (Fetus B)  Amniotic F.V:   Pocket => 2 cm             F. Tone:        Observed  F. Movement:    Observed                   N.S.T:          Reactive  F. Breathing:   Observed                   Score:          10/10 ---------------------------------------------------------------------- Gestational Age (Fetus B)  LMP:           34w 3d        Date:  12/06/21                   EDD:   09/12/22  Best:          33w 4d     Det. By:  Previous Ultrasound      EDD:   09/18/22                                      (04/06/22) ---------------------------------------------------------------------- Anatomy (Fetus B)  Diaphragm:             Appears normal         Kidneys:                Appear normal  Stomach:               Appears normal, left   Bladder:                Appears normal                         sided ---------------------------------------------------------------------- Doppler - Fetal Vessels (Fetus B)  Umbilical Artery   S/D     %tile      RI    %tile      PI    %tile     PSV    ADFV    RDFV                                                     (  cm/s)   6.43   > 97.5    0.84   > 97.5    1.69   > 97.5    37.23      No      No ---------------------------------------------------------------------- Cervix Uterus Adnexa  Cervix  Not visualized (advanced GA >24wks)  Uterus  No abnormality visualized.  Right Ovary  Within normal limits.  Left Ovary  Not visualized.  Cul De Sac  No free fluid seen.  Adnexa  No abnormality visualized ---------------------------------------------------------------------- Comments  Follow-up ultrasound for di-di twins. She has no concerns  today.  Sonographic findings  Di-di intrauterine pregnancy at 33w 4d  Fetal cardiac activity: A: Observed, B: Observed.  Presentation: A: Cephalic, B: Breech.   Internal anatomy: appears normal except for: A: EIF; B: UTD  (resolved), hypoplastic NB, AVSD, EIF, Marginal CI  Amniotic fluid volume: A: Within normal limits, B: Within  normal limits.  Placentas: A: Posterior, B: Anterior.  UA dopplers: A: normal, B: elevated  Recommendations  -Serial growth ultrasounds every 3-4 weeks until delivery  -Continue twice weekly testing and weekly UA dopplers  -Delivery around [redacted] weeks gestation. 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 (see fetal echo in Care  Everywhere) ----------------------------------------------------------------------                 Braxton Feathers, DO Electronically Signed Final Report   08/04/2022 12:48 pm ----------------------------------------------------------------------  [place] MFM FETAL BPP W/NONSTRESS ADD'L GEST  Result Date: 08/04/2022 ----------------------------------------------------------------------  OBSTETRICS REPORT                       (Signed Final 08/04/2022 12:48 pm) ---------------------------------------------------------------------- Patient Info  ID #:       [id number]                          D.O.B.:  24-Apr-1985 (37 yrs)  Name:       Monica Green                       Visit Date: 08/04/2022 08:49 am              [place] ---------------------------------------------------------------------- Performed By  Attending:        Braxton Feathers DO       Secondary Phy.:   Nigel Bridgeman                                                             CNM  Performed By:     Anabel Halon          Address:          [facility]                    RDMS                                                             Obstetrics &  Gynecology                                                             [address]                                                             [address]                                                              [place], [place]                                                             [address]  Referred By:      Mittie Bodo            Location[facility]         [facility]  Ref. Address:     [facility] ---------------------------------------------------------------------- Orders  #  Description                           Code        Ordered By  1  [place] MFM UA CORD DOPPLER  [address].60    RAVI SHANKAR  2  [place] MFM UA ADDL GEST                   76820.01    RAVI SHANKAR  3  [place] MFM FETAL BPP                      76818.5     RAVI SHANKAR     W/NONSTRESS  4  [place] MFM FETAL BPP                      45409.8     RAVI SHANKAR     W/NONSTRESS ADD'L GEST ----------------------------------------------------------------------  #  Order #                     Accession #                Episode #  1  [id number]                   [id number]                 [id number]  2  [id number]                   [id number]                 [id number]  3  [id number]                   [id number]                 [id number]  4  [id number]                   [id number]                 [id number] ---------------------------------------------------------------------- Indications  Maternal care for known or suspected poor      O36.5931  fetal growth, third trimester, fetus 1 IUGR  Maternal care for known or suspected poor      O36.5932  fetal growth, third trimester, fetus 2 IUGR  Twin pregnancy, di/di, third trimester         O30.043  Anemia during pregnancy in third trimester     O69.013  Twin B: UTDA1, hypoplastic nasal bone,         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, EIF; Twin A: EIF  Medical complication of pregnancy              O26.90  (Thrombocytosis)  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  management of mother in third trimester  (Twin  B)  Abnormal biochemical screen (quad) for         O28.9  Trisomy 63  Advanced maternal age multigravida 92+,        O57.523  third trimester (37 yrs)  [redacted] weeks gestation of pregnancy                Z3A.33  Poor obstetric history: Previous               O09.299  preeclampsia / eclampsia/gestational HTN ---------------------------------------------------------------------- Fetal Evaluation (Fetus A)  Num Of Fetuses:         2  Fetal Heart Rate(bpm):  124  Cardiac Activity:       Observed  Presentation:           Cephalic  Placenta:  Posterior  P. Cord Insertion:      Previously visualized  Amniotic Fluid  AFI FV:      Within normal limits  AFI Sum(cm)     %Tile       Largest Pocket(cm)  6.91            < 3         6.89  RUQ(cm)  6.91 ---------------------------------------------------------------------- Biophysical Evaluation (Fetus A)  Amniotic F.V:   Pocket => 2 cm             F. Tone:        Observed  F. Movement:    Observed                   N.S.T:          Reactive  F. Breathing:   Observed                   Score:          10/10 ---------------------------------------------------------------------- OB History  Blood Type:   B+  Maternal Racial/Ethnic Group:   Black (non-Hispanic)  Gravidity:    2         Term:   1        Prem:   0        SAB:   0  TOP:          0       Ectopic:  0        Living: 1 ---------------------------------------------------------------------- Gestational Age (Fetus A)  LMP:           34w 3d        Date:  12/06/21                   EDD:   09/12/22  Best:          33w 4d     Det. By:  Previous Ultrasound      EDD:   09/18/22                                      (04/06/22) ---------------------------------------------------------------------- Anatomy (Fetus A)  Diaphragm:             Appears normal         Kidneys:                Appear normal  Stomach:               Appears normal, left   Bladder:                Appears normal                         sided  ---------------------------------------------------------------------- Doppler - Fetal Vessels (Fetus A)  Umbilical Artery   S/D     %tile      RI    %tile      PI    %tile     PSV    ADFV    RDFV                                                     (cm/s)  2.05       18    0.51       12     0.7       15    63.21      No      No ---------------------------------------------------------------------- Fetal Evaluation (Fetus B)  Num Of Fetuses:         2  Fetal Heart Rate(bpm):  135  Cardiac Activity:       Observed  Presentation:           Breech  Placenta:               Anterior  P. Cord Insertion:      Previously visualized  Amniotic Fluid  AFI FV:      Within normal limits                              Largest Pocket(cm)                              4.78 ---------------------------------------------------------------------- Biophysical Evaluation (Fetus B)  Amniotic F.V:   Pocket => 2 cm             F. Tone:        Observed  F. Movement:    Observed                   N.S.T:          Reactive  F. Breathing:   Observed                   Score:          10/10 ---------------------------------------------------------------------- Gestational Age (Fetus B)  LMP:           34w 3d        Date:  12/06/21                   EDD:   09/12/22  Best:          33w 4d     Det. By:  Previous Ultrasound      EDD:   09/18/22                                      (04/06/22) ---------------------------------------------------------------------- Anatomy (Fetus B)  Diaphragm:             Appears normal         Kidneys:                Appear normal  Stomach:               Appears normal, left   Bladder:                Appears normal                         sided ---------------------------------------------------------------------- Doppler - Fetal Vessels (Fetus B)  Umbilical Artery   S/D     %tile      RI    %tile      PI    %tile     PSV    ADFV    RDFV                                                     (  cm/s)   6.43   > 97.5    0.84   >  97.5    1.69   > 97.5    37.23      No      No ---------------------------------------------------------------------- Cervix Uterus Adnexa  Cervix  Not visualized (advanced GA >24wks)  Uterus  No abnormality visualized.  Right Ovary  Within normal limits.  Left Ovary  Not visualized.  Cul De Sac  No free fluid seen.  Adnexa  No abnormality visualized ---------------------------------------------------------------------- Comments  Follow-up ultrasound for di-di twins. She has no concerns  today.  Sonographic findings  Di-di intrauterine pregnancy at 33w 4d  Fetal cardiac activity: A: Observed, B: Observed.  Presentation: A: Cephalic, B: Breech.  Internal anatomy: appears normal except for: A: EIF; B: UTD  (resolved), hypoplastic NB, AVSD, EIF, Marginal CI  Amniotic fluid volume: A: Within normal limits, B: Within  normal limits.  Placentas: A: Posterior, B: Anterior.  UA dopplers: A: normal, B: elevated  Recommendations  -Serial growth ultrasounds every 3-4 weeks until delivery  -Continue twice weekly testing and weekly UA dopplers  -Delivery around [redacted] weeks gestation. 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 (see fetal echo in Care  Everywhere) ----------------------------------------------------------------------                 Braxton Feathers, DO Electronically Signed Final Report   08/04/2022 12:48 pm ----------------------------------------------------------------------  [place] MFM UA CORD DOPPLER  Result Date: 08/01/2022 ----------------------------------------------------------------------  OBSTETRICS REPORT                       (Signed Final 08/01/2022 05:05 pm) ---------------------------------------------------------------------- Patient Info  ID #:       [id number]                          D.O.B.:  04/05/1985 (37 yrs)  Name:       WILLAMAE DELLES                       Visit Date: 08/01/2022 08:33 am              [place] ----------------------------------------------------------------------  Performed By  Attending:        Braxton Feathers DO       Secondary Phy.:   Nigel Bridgeman                                                             CNM  Performed By:     Tommie Raymond BS,       Address:          [facility]                    RDMS, RVTUA                                                             Obstetrics &  Gynecology                                                             [address]                                                             [address]                                                             [place], [place]                                                             [address]  Referred By:      Mittie Bodo            Location[facility]         [facility]  Ref. Address:     [facility] ---------------------------------------------------------------------- Orders  #  Description                           Code        Ordered By  1  [place] MFM FETAL BPP  16109.6     RAVI SHANKAR     W/NONSTRESS  2  [place] MFM FETAL BPP                      04540.9     RAVI SHANKAR     W/NONSTRESS ADD'L GEST  3  [place] MFM UA CORD DOPPLER                76820.02    RAVI SHANKAR  4  [place] MFM UA ADDL GEST                   76820.01    RAVI SHANKAR ----------------------------------------------------------------------  #  Order #                     Accession #                Episode #  1  [id number]                   [id number]                 [id number]  2  [id number]                   [id number]                 [id number]  3  [id number]                   [id number]                 [id number]  4  [id number]                   [id number]                 [id number]  ---------------------------------------------------------------------- Indications  [redacted] weeks gestation of pregnancy                Z3A.33  Maternal care for known or suspected poor      O36.5931  fetal growth, third trimester, fetus 1 IUGR  Maternal care for known or suspected poor      O36.5932  fetal growth, third trimester, fetus 2 IUGR  Twin pregnancy, di/di, third trimester         O30.043  Anemia during pregnancy in third trimester     O16.013  Twin B: UTDA1, hypoplastic nasal bone,         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, EIF; Twin A: EIF  Medical complication of pregnancy              O26.90  (Thrombocytosis)  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 Cord Insertion:      Previously visualized  Amniotic Fluid  AFI FV:      Within normal limits                              Largest Pocket(cm)                              4.81 ---------------------------------------------------------------------- Biophysical Evaluation (Fetus A)  Amniotic F.V:   Pocket => 2 cm             F. Tone:        Observed  F. Movement:    Observed                   N.S.T:          Reactive  F. Breathing:   Not Observed               Score:          8/10 ---------------------------------------------------------------------- OB History  Blood Type:   B+  Maternal Racial/Ethnic Group:   Black (non-Hispanic)  Gravidity:    2         Term:   1         Prem:   0        SAB:   0  TOP:          0       Ectopic:  0        Living: 1 ---------------------------------------------------------------------- Gestational Age (Fetus A)  LMP:           34w 0d        Date:  12/06/21                   EDD:   09/12/22  Best:          33w 1d     Det. By:  Previous Ultrasound      EDD:   09/18/22                                      (04/06/22) ---------------------------------------------------------------------- Anatomy (Fetus A)  Cranium:               Previously seen        Aortic Arch:            Previously seen  Cavum:                 Previously seen        Ductal Arch:            Previously seen  Ventricles:            Appears normal         Diaphragm:              Appears normal  Choroid Plexus:        Previously seen        Stomach:                Appears normal, left  sided  Cerebellum:            Previously seen        Abdomen:                Previously seen  Posterior Fossa:       Previously seen        Abdominal Wall:         Previously seen  Nuchal Fold:           Previously seen        Cord Vessels:           Previously seen  Face:                  Orbits and profile     Kidneys:                Appear normal                         previously seen  Lips:                  Previously seen        Bladder:                Appears normal  Thoracic:              Appears normal         Spine:                  Previously seen  Heart:                 Previously seen        Upper Extremities:      Previously seen  RVOT:                  Previously seen        Lower Extremities:      Previously seen  LVOT:                  Previously seen  Other:  IVC/SVC, 3VV, 3VTV, Hands, feet/ heels, and Female gender previously          visualized. Technically difficult due to advanced GA and fetal position. ---------------------------------------------------------------------- Doppler - Fetal Vessels (Fetus A)  Umbilical Artery    S/D     %tile      RI    %tile      PI    %tile     PSV    ADFV    RDFV                                                     (cm/s)   2.02       15     0.5        9    0.71       15    37.74      No      No ---------------------------------------------------------------------- Fetal Evaluation (Fetus B)  Num Of Fetuses:         2  Fetal Heart Rate(bpm):  144  Cardiac Activity:       Observed  Fetal Lie:              Maternal  left side  Presentation:           Breech  Placenta:               Anterior  P. Cord Insertion:      Previously visualized  Amniotic Fluid  AFI FV:      Within normal limits                              Largest Pocket(cm)                              5.16 ---------------------------------------------------------------------- Biophysical Evaluation (Fetus B)  Amniotic F.V:   Pocket => 2 cm             F. Tone:        Observed  F. Movement:    Observed                   N.S.T:          Reactive  F. Breathing:   Observed                   Score:          10/10 ---------------------------------------------------------------------- Gestational Age (Fetus B)  LMP:           34w 0d        Date:  12/06/21                   EDD:   09/12/22  Best:          33w 1d     Det. By:  Previous Ultrasound      EDD:   09/18/22                                      (04/06/22) ---------------------------------------------------------------------- Anatomy (Fetus B)  Cranium:               Previously seen        Heart:                  Abnormal, see                                                                        comments  Cavum:                 Previously seen        Diaphragm:              Appears normal  Ventricles:            Previously seen        Stomach:                Appears normal, left  sided  Choroid Plexus:        Previously seen        Abdomen:                Previously seen  Cerebellum:            Previously seen        Abdominal  Wall:         Previously seen  Posterior Fossa:       Previously seen        Cord Vessels:           Previously seen  Nuchal Fold:           Previously seen        Kidneys:                Appear normal  Face:                  Orbits and profile     Bladder:                Appears normal                         previously seen  Lips:                  Previously seen        Spine:                  Previously seen  Palate:                Previously             Upper Extremities:      Previously seen                         visualized  Thoracic:              Appears normal         Lower Extremities:      Previously seen  Other:  Cardiac anomaly- AVSD. Hands and feet/heels previously visualized.          Technically difficult due to advanced GA and fetal position. ---------------------------------------------------------------------- Doppler - Fetal Vessels (Fetus B)  Umbilical Artery   S/D     %tile      RI    %tile      PI    %tile     PSV    ADFV    RDFV                                                     (cm/s)   7.25   > 97.5    0.86   > 97.5    1.64   > 97.5    34.01      No      No ---------------------------------------------------------------------- Cervix Uterus Adnexa  Cervix  Not visualized (advanced GA >24wks)  Uterus  No abnormality visualized.  Right Ovary  Within normal limits.  Left Ovary  Within normal limits.  Cul De Sac  No free fluid seen.  Adnexa  No abnormality visualized ---------------------------------------------------------------------- Comments  Follow-up ultrasound for di-di twins. She has no concerns  today.  Sonographic findings  Di-di intrauterine  pregnancy at 33w 1d  Fetal cardiac activity: A: Observed, B: Observed.  Presentation: A: Cephalic, B: Breech.  Internal anatomy: appears normal x 2.  Fetal biometry (estimated fetal weight):  percentile.  Amniotic fluid volume: A: Within normal limits, B: Within  normal limits.  Placentas: A: Posterior, B: Anterior.  UA dopplers: A: normal, B:  elevated.  BPP: A: 8/10, B: 10/10  Recommendations  -Serial growth ultrasounds every 3-4 weeks until delivery  -Continue twice weekly testing and weekly UA dopplers  -Delivery around [redacted] weeks gestation. 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 (see fetal echo in Care  Everywhere) ----------------------------------------------------------------------                  Braxton Feathers, DO Electronically Signed Final Report   08/01/2022 05:05 pm ----------------------------------------------------------------------  [place] MFM UA ADDL GEST  Result Date: 08/01/2022 ----------------------------------------------------------------------  OBSTETRICS REPORT                       (Signed Final 08/01/2022 05:05 pm) ---------------------------------------------------------------------- Patient Info  ID #:       [id number]                          D.O.B.:  1985/07/05 (37 yrs)  Name:       Monica Green                       Visit Date: 08/01/2022 08:33 am              [place] ---------------------------------------------------------------------- Performed By  Attending:        Braxton Feathers DO       Secondary Phy.:   Nigel Bridgeman                                                             CNM  Performed By:     Tommie Raymond BS,       Address:          [facility]                    RDMS, RVTUA                                                             Obstetrics &                                                             Gynecology                                                             [address]                                                             [address]                                                             [place], [place]                                                             [address]  Referred By:      Mittie Bodo            Location[facility]         [facility]  Ref. Address:     [facility] ---------------------------------------------------------------------- Orders  #  Description                           Code        Ordered By  1  [place] MFM FETAL BPP                      [id number]     RAVI SHANKAR     W/NONSTRESS  2  [place] MFM FETAL BPP                      32951.8     RAVI SHANKAR     W/NONSTRESS ADD'L GEST  3  [place] MFM UA CORD DOPPLER                76820.02    RAVI SHANKAR  4  [place] MFM UA ADDL GEST                   76820.01    RAVI SHANKAR ----------------------------------------------------------------------  #  Order #                     Accession #                Episode #  1  [id number]                   [id number]                 [id number]  2  [id number]                   [id number]                 [id number]  3  [id number]                   [id number]                 [id number]  4  [id number]                   [id number]                 [id number] ---------------------------------------------------------------------- Indications  [redacted] weeks gestation of pregnancy                Z3A.33  Maternal care for known or suspected poor      O36.5931  fetal growth, third trimester, fetus 1 IUGR  Maternal care for known or suspected poor      O36.5932  fetal growth, third trimester, fetus 2 IUGR  Twin pregnancy, di/di, third trimester         O30.043  Anemia during pregnancy in third trimester     O45.013  Twin B: UTDA1, hypoplastic nasal bone,         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, EIF; Twin A: EIF  Medical complication of pregnancy              O26.90  (Thrombocytosis)  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  management of mother in third trimester  (Twin B)  Abnormal biochemical screen (quad) for         O28.9  Trisomy 60  Advanced maternal age multigravida 59+,        O50.523  third trimester (37 yrs)  Poor obstetric history: Previous                O09.299  preeclampsia / eclampsia/gestational HTN ---------------------------------------------------------------------- Fetal Evaluation (Fetus A)  Num Of Fetuses:         2  Fetal Heart Rate(bpm):  144  Cardiac Activity:       Observed  Fetal Lie:              Maternal right side  Presentation:           Cephalic  Placenta:               Posterior  P. Cord Insertion:      Previously visualized  Amniotic Fluid  AFI FV:      Within normal limits                              Largest Pocket(cm)                              4.81 ---------------------------------------------------------------------- Biophysical Evaluation (Fetus A)  Amniotic F.V:   Pocket =>  2 cm             F. Tone:        Observed  F. Movement:    Observed                   N.S.T:          Reactive  F. Breathing:   Not Observed               Score:          8/10 ---------------------------------------------------------------------- OB History  Blood Type:   B+  Maternal Racial/Ethnic Group:   Black (non-Hispanic)  Gravidity:    2         Term:   1        Prem:   0        SAB:   0  TOP:          0       Ectopic:  0        Living: 1 ---------------------------------------------------------------------- Gestational Age (Fetus A)  LMP:           34w 0d        Date:  12/06/21                   EDD:   09/12/22  Best:          33w 1d     Det. By:  Previous Ultrasound      EDD:   09/18/22                                      (04/06/22) ---------------------------------------------------------------------- Anatomy (Fetus A)  Cranium:               Previously seen        Aortic Arch:            Previously seen  Cavum:                 Previously seen        Ductal Arch:            Previously seen  Ventricles:            Appears normal         Diaphragm:              Appears normal  Choroid Plexus:        Previously seen        Stomach:                Appears normal, left                                                                        sided   Cerebellum:            Previously seen        Abdomen:                Previously seen  Posterior Fossa:       Previously seen        Abdominal Wall:  Previously seen  Nuchal Fold:           Previously seen        Cord Vessels:           Previously seen  Face:                  Orbits and profile     Kidneys:                Appear normal                         previously seen  Lips:                  Previously seen        Bladder:                Appears normal  Thoracic:              Appears normal         Spine:                  Previously seen  Heart:                 Previously seen        Upper Extremities:      Previously seen  RVOT:                  Previously seen        Lower Extremities:      Previously seen  LVOT:                  Previously seen  Other:  IVC/SVC, 3VV, 3VTV, Hands, feet/ heels, and Female gender previously          visualized. Technically difficult due to advanced GA and fetal position. ---------------------------------------------------------------------- Doppler - Fetal Vessels (Fetus A)  Umbilical Artery   S/D     %tile      RI    %tile      PI    %tile     PSV    ADFV    RDFV                                                     (cm/s)   2.02       15     0.5        9    0.71       15    37.74      No      No ---------------------------------------------------------------------- Fetal Evaluation (Fetus B)  Num Of Fetuses:         2  Fetal Heart Rate(bpm):  144  Cardiac Activity:       Observed  Fetal Lie:              Maternal left side  Presentation:           Breech  Placenta:               Anterior  P. Cord Insertion:      Previously visualized  Amniotic Fluid  AFI FV:      Within normal limits  Largest Pocket(cm)                              5.16 ---------------------------------------------------------------------- Biophysical Evaluation (Fetus B)  Amniotic F.V:   Pocket => 2 cm             F. Tone:        Observed  F. Movement:    Observed                    N.S.T:          Reactive  F. Breathing:   Observed                   Score:          10/10 ---------------------------------------------------------------------- Gestational Age (Fetus B)  LMP:           34w 0d        Date:  12/06/21                   EDD:   09/12/22  Best:          33w 1d     Det. By:  Previous Ultrasound      EDD:   09/18/22                                      (04/06/22) ---------------------------------------------------------------------- Anatomy (Fetus B)  Cranium:               Previously seen        Heart:                  Abnormal, see                                                                        comments  Cavum:                 Previously seen        Diaphragm:              Appears normal  Ventricles:            Previously seen        Stomach:                Appears normal, left                                                                        sided  Choroid Plexus:        Previously seen        Abdomen:                Previously seen  Cerebellum:            Previously seen        Abdominal Wall:  Previously seen  Posterior Fossa:       Previously seen        Cord Vessels:           Previously seen  Nuchal Fold:           Previously seen        Kidneys:                Appear normal  Face:                  Orbits and profile     Bladder:                Appears normal                         previously seen  Lips:                  Previously seen        Spine:                  Previously seen  Palate:                Previously             Upper Extremities:      Previously seen                         visualized  Thoracic:              Appears normal         Lower Extremities:      Previously seen  Other:  Cardiac anomaly- AVSD. Hands and feet/heels previously visualized.          Technically difficult due to advanced GA and fetal position. ---------------------------------------------------------------------- Doppler - Fetal Vessels (Fetus B)  Umbilical Artery   S/D      %tile      RI    %tile      PI    %tile     PSV    ADFV    RDFV                                                     (cm/s)   7.25   > 97.5    0.86   > 97.5    1.64   > 97.5    34.01      No      No ---------------------------------------------------------------------- Cervix Uterus Adnexa  Cervix  Not visualized (advanced GA >24wks)  Uterus  No abnormality visualized.  Right Ovary  Within normal limits.  Left Ovary  Within normal limits.  Cul De Sac  No free fluid seen.  Adnexa  No abnormality visualized ---------------------------------------------------------------------- Comments  Follow-up ultrasound for di-di twins. She has no concerns  today.  Sonographic findings  Di-di intrauterine pregnancy at 33w 1d  Fetal cardiac activity: A: Observed, B: Observed.  Presentation: A: Cephalic, B: Breech.  Internal anatomy: appears normal x 2.  Fetal biometry (estimated fetal weight):  percentile.  Amniotic fluid volume: A: Within normal limits, B: Within  normal limits.  Placentas: A: Posterior, B: Anterior.  UA dopplers: A: normal, B: elevated.  BPP: A: 8/10, B: 10/10  Recommendations  -Serial growth  ultrasounds every 3-4 weeks until delivery  -Continue twice weekly testing and weekly UA dopplers  -Delivery around [redacted] weeks gestation. 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 (see fetal echo in Care  Everywhere) ----------------------------------------------------------------------                  Braxton Feathers, DO Electronically Signed Final Report   08/01/2022 05:05 pm ----------------------------------------------------------------------  [place] MFM FETAL BPP W/NONSTRESS  Result Date: 08/01/2022 ----------------------------------------------------------------------  OBSTETRICS REPORT                       (Signed Final 08/01/2022 05:05 pm) ---------------------------------------------------------------------- Patient Info  ID #:       [id number]                          D.O.B.:  19-Aug-1985 (37 yrs)   Name:       Monica Green                       Visit Date: 08/01/2022 08:33 am              [place] ---------------------------------------------------------------------- Performed By  Attending:        Braxton Feathers DO       Secondary Phy.:   Nigel Bridgeman                                                             CNM  Performed By:     Tommie Raymond BS,       Address:          [facility]                    RDMS, RVTUA                                                             Obstetrics &                                                             Gynecology                                                             [address]  [address]                                                             [place], [place]                                                             [address]  Referred By:      Mittie Bodo            Location[facility]         [facility]  Ref. Address:     CCOB ---------------------------------------------------------------------- Orders  #  Description                           Code        Ordered By  1  [place] MFM FETAL BPP                      [id number]     RAVI SHANKAR     W/NONSTRESS  2  [place] MFM FETAL BPP                      60454.0     RAVI [facility]     W/NONSTRESS ADD'L GEST  3  [place] MFM UA CORD DOPPLER                [id number]    RAVI SHANKAR  4  [place] MFM UA ADDL GEST                   76820.01    RAVI SHANKAR ----------------------------------------------------------------------  #  Order #                     Accession #                Episode #  1  [id number]  [id number]                 [id number]  2  [id number]                   [id number]                  [id number]  3  [id number]                   [id number]                 [id number]  4  [id number]                   [id number]                 [id number] ---------------------------------------------------------------------- Indications  [redacted] weeks gestation of pregnancy                Z3A.33  Maternal care for known or suspected poor      O36.5931  fetal growth, third trimester, fetus 1 IUGR  Maternal care for known or suspected poor      O36.5932  fetal growth, third trimester, fetus 2 IUGR  Twin pregnancy, di/di, third trimester         O30.043  Anemia during pregnancy in third trimester     O23.013  Twin B: UTDA1, hypoplastic nasal bone,         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, EIF; Twin A: EIF  Medical complication of pregnancy              O26.90  (Thrombocytosis)  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  management of mother in third trimester  (Twin B)  Abnormal biochemical screen (quad) for         O28.9  Trisomy 62  Advanced maternal age multigravida 16+,        O62.523  third trimester (37 yrs)  Poor obstetric history: Previous               O09.299  preeclampsia / eclampsia/gestational HTN ---------------------------------------------------------------------- Fetal Evaluation (Fetus A)  Num Of Fetuses:         2  Fetal Heart Rate(bpm):  144  Cardiac Activity:       Observed  Fetal Lie:              Maternal right side  Presentation:           Cephalic  Placenta:               Posterior  P. Cord Insertion:      Previously visualized  Amniotic Fluid  AFI FV:      Within normal limits                              Largest Pocket(cm)                              4.81 ---------------------------------------------------------------------- Biophysical Evaluation (Fetus A)  Amniotic F.V:   Pocket => 2 cm             F. Tone:        Observed  F. Movement:    Observed                   N.S.T:          Reactive  F.  Breathing:   Not Observed               Score:          8/10  ---------------------------------------------------------------------- OB History  Blood Type:   B+  Maternal Racial/Ethnic Group:   Black (non-Hispanic)  Gravidity:    2         Term:   1        Prem:   0        SAB:   0  TOP:          0       Ectopic:  0        Living: 1 ---------------------------------------------------------------------- Gestational Age (Fetus A)  LMP:           34w 0d        Date:  12/06/21                   EDD:   09/12/22  Best:          33w 1d     Det. By:  Previous Ultrasound      EDD:   09/18/22                                      (04/06/22) ---------------------------------------------------------------------- Anatomy (Fetus A)  Cranium:               Previously seen        Aortic Arch:            Previously seen  Cavum:                 Previously seen        Ductal Arch:            Previously seen  Ventricles:            Appears normal         Diaphragm:              Appears normal  Choroid Plexus:        Previously seen        Stomach:                Appears normal, left                                                                        sided  Cerebellum:            Previously seen        Abdomen:                Previously seen  Posterior Fossa:       Previously seen        Abdominal Wall:         Previously seen  Nuchal Fold:           Previously seen        Cord Vessels:           Previously seen  Face:                  Orbits and  profile     Kidneys:                Appear normal                         previously seen  Lips:                  Previously seen        Bladder:                Appears normal  Thoracic:              Appears normal         Spine:                  Previously seen  Heart:                 Previously seen        Upper Extremities:      Previously seen  RVOT:                  Previously seen        Lower Extremities:      Previously seen  LVOT:                  Previously seen  Other:  IVC/SVC, 3VV, 3VTV, Hands, feet/ heels, and Female gender previously           visualized. Technically difficult due to advanced GA and fetal position. ---------------------------------------------------------------------- Doppler - Fetal Vessels (Fetus A)  Umbilical Artery   S/D     %tile      RI    %tile      PI    %tile     PSV    ADFV    RDFV                                                     (cm/s)   2.02       15     0.5        9    0.71       15    37.74      No      No ---------------------------------------------------------------------- Fetal Evaluation (Fetus B)  Num Of Fetuses:         2  Fetal Heart Rate(bpm):  144  Cardiac Activity:       Observed  Fetal Lie:              Maternal left side  Presentation:           Breech  Placenta:               Anterior  P. Cord Insertion:      Previously visualized  Amniotic Fluid  AFI FV:      Within normal limits                              Largest Pocket(cm)                              5.16 ---------------------------------------------------------------------- Biophysical Evaluation (Fetus B)  Amniotic F.V:  Pocket => 2 cm             F. Tone:        Observed  F. Movement:    Observed                   N.S.T:          Reactive  F. Breathing:   Observed                   Score:          10/10 ---------------------------------------------------------------------- Gestational Age (Fetus B)  LMP:           34w 0d        Date:  12/06/21                   EDD:   09/12/22  Best:          33w 1d     Det. By:  Previous Ultrasound      EDD:   09/18/22                                      (04/06/22) ---------------------------------------------------------------------- Anatomy (Fetus B)  Cranium:               Previously seen        Heart:                  Abnormal, see                                                                        comments  Cavum:                 Previously seen        Diaphragm:              Appears normal  Ventricles:            Previously seen        Stomach:                Appears normal, left                                                                         sided  Choroid Plexus:        Previously seen        Abdomen:                Previously seen  Cerebellum:            Previously seen        Abdominal Wall:         Previously seen  Posterior Fossa:       Previously seen        Cord Vessels:           Previously seen  Nuchal  Fold:           Previously seen        Kidneys:                Appear normal  Face:                  Orbits and profile     Bladder:                Appears normal                         previously seen  Lips:                  Previously seen        Spine:                  Previously seen  Palate:                Previously             Upper Extremities:      Previously seen                         visualized  Thoracic:              Appears normal         Lower Extremities:      Previously seen  Other:  Cardiac anomaly- AVSD. Hands and feet/heels previously visualized.          Technically difficult due to advanced GA and fetal position. ---------------------------------------------------------------------- Doppler - Fetal Vessels (Fetus B)  Umbilical Artery   S/D     %tile      RI    %tile      PI    %tile     PSV    ADFV    RDFV                                                     (cm/s)   7.25   > 97.5    0.86   > 97.5    1.64   > 97.5    34.01      No      No ---------------------------------------------------------------------- Cervix Uterus Adnexa  Cervix  Not visualized (advanced GA >24wks)  Uterus  No abnormality visualized.  Right Ovary  Within normal limits.  Left Ovary  Within normal limits.  Cul De Sac  No free fluid seen.  Adnexa  No abnormality visualized ---------------------------------------------------------------------- Comments  Follow-up ultrasound for di-di twins. She has no concerns  today.  Sonographic findings  Di-di intrauterine pregnancy at 33w 1d  Fetal cardiac activity: A: Observed, B: Observed.  Presentation: A: Cephalic, B: Breech.  Internal anatomy: appears normal x 2.  Fetal  biometry (estimated fetal weight):  percentile.  Amniotic fluid volume: A: Within normal limits, B: Within  normal limits.  Placentas: A: Posterior, B: Anterior.  UA dopplers: A: normal, B: elevated.  BPP: A: 8/10, B: 10/10  Recommendations  -Serial growth ultrasounds every 3-4 weeks until delivery  -Continue twice weekly testing and weekly UA dopplers  -Delivery around [redacted] weeks gestation. 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 (see fetal echo  in Care  Everywhere) ----------------------------------------------------------------------                  Braxton Feathers, DO Electronically Signed Final Report   08/01/2022 05:05 pm ----------------------------------------------------------------------  [place] MFM FETAL BPP W/NONSTRESS ADD'L GEST  Result Date: 08/01/2022 ----------------------------------------------------------------------  OBSTETRICS REPORT                       (Signed Final 08/01/2022 05:05 pm) ---------------------------------------------------------------------- Patient Info  ID #:       [id number]                          D.O.B.:  06/26/1985 (37 yrs)  Name:       Monica Green                       Visit Date: 08/01/2022 08:33 am              [place] ---------------------------------------------------------------------- Performed By  Attending:        Braxton Feathers DO       Secondary Phy.:   Nigel Bridgeman                                                             CNM  Performed By:     Tommie Raymond BS,       Address:          [facility]                    RDMS, RVTUA                                                             Obstetrics &                                                             Gynecology                                                             [address].  [address]                                                              [place], [place]                                                             [address]  Referred By:      Mittie Bodo            Location:         [facility]  Ref. Address:     CCOB ---------------------------------------------------------------------- Orders  #  Description                           Code        Ordered By  1  [place] MFM FETAL BPP                      [id number]     RAVI SHANKAR     W/NONSTRESS  2  [place] MFM FETAL BPP                      60454.0     RAVI [facility]     W/NONSTRESS ADD'L GEST  3  [place] MFM UA CORD DOPPLER                [id number]    RAVI SHANKAR  4  [place] MFM UA ADDL GEST                   76820.01    RAVI SHANKAR ----------------------------------------------------------------------  #  Order #                     Accession #                Episode #  1  [id number]  [id number]                 [id number]  2  [id number]                   [id number]                 [id number]  3  [id number]                   [id number]                 [id number]  4  [id number]                   [id number]                 [id number] ---------------------------------------------------------------------- Indications  [redacted] weeks gestation of pregnancy                Z3A.33  Maternal care for known or suspected poor      O36.5931  fetal growth, third trimester, fetus 1 IUGR  Maternal care for known or suspected poor      O36.5932  fetal growth, third trimester, fetus 2 IUGR  Twin pregnancy, di/di, third trimester         O30.043  Anemia during pregnancy in third trimester     O31.013  Twin B: UTDA1, hypoplastic nasal bone,         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, EIF; Twin A: EIF  Medical complication of pregnancy              O26.90  (Thrombocytosis)  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  management of  mother in third trimester  (Twin B)  Abnormal biochemical screen (quad) for         O28.9  Trisomy 67  Advanced maternal age multigravida 54+,        O13.523  third trimester (37 yrs)  Poor obstetric history: Previous               O09.299  preeclampsia / eclampsia/gestational HTN ---------------------------------------------------------------------- Fetal Evaluation (Fetus A)  Num Of Fetuses:         2  Fetal Heart Rate(bpm):  144  Cardiac Activity:       Observed  Fetal Lie:              Maternal right side  Presentation:           Cephalic  Placenta:               Posterior  P. Cord Insertion:      Previously visualized  Amniotic Fluid  AFI FV:      Within normal limits                              Largest Pocket(cm)                              4.81 ---------------------------------------------------------------------- Biophysical Evaluation (Fetus A)  Amniotic F.V:   Pocket => 2 cm             F. Tone:        Observed  F. Movement:    Observed                   N.S.T:          Reactive  F.  Breathing:   Not Observed               Score:          8/10 ---------------------------------------------------------------------- OB History  Blood Type:   B+  Maternal Racial/Ethnic Group:   Black (non-Hispanic)  Gravidity:    2         Term:   1        Prem:   0        SAB:   0  TOP:          0       Ectopic:  0        Living: 1 ---------------------------------------------------------------------- Gestational Age (Fetus A)  LMP:           34w 0d        Date:  12/06/21                   EDD:   09/12/22  Best:          33w 1d     Det. By:  Previous Ultrasound      EDD:   09/18/22                                      (04/06/22) ---------------------------------------------------------------------- Anatomy (Fetus A)  Cranium:               Previously seen        Aortic Arch:            Previously seen  Cavum:                 Previously seen        Ductal Arch:            Previously seen  Ventricles:            Appears normal          Diaphragm:              Appears normal  Choroid Plexus:        Previously seen        Stomach:                Appears normal, left                                                                        sided  Cerebellum:            Previously seen        Abdomen:                Previously seen  Posterior Fossa:       Previously seen        Abdominal Wall:         Previously seen  Nuchal Fold:           Previously seen        Cord Vessels:           Previously seen  Face:                  Orbits and  profile     Kidneys:                Appear normal                         previously seen  Lips:                  Previously seen        Bladder:                Appears normal  Thoracic:              Appears normal         Spine:                  Previously seen  Heart:                 Previously seen        Upper Extremities:      Previously seen  RVOT:                  Previously seen        Lower Extremities:      Previously seen  LVOT:                  Previously seen  Other:  IVC/SVC, 3VV, 3VTV, Hands, feet/ heels, and Female gender previously          visualized. Technically difficult due to advanced GA and fetal position. ---------------------------------------------------------------------- Doppler - Fetal Vessels (Fetus A)  Umbilical Artery   S/D     %tile      RI    %tile      PI    %tile     PSV    ADFV    RDFV                                                     (cm/s)   2.02       15     0.5        9    0.71       15    37.74      No      No ---------------------------------------------------------------------- Fetal Evaluation (Fetus B)  Num Of Fetuses:         2  Fetal Heart Rate(bpm):  144  Cardiac Activity:       Observed  Fetal Lie:              Maternal left side  Presentation:           Breech  Placenta:               Anterior  P. Cord Insertion:      Previously visualized  Amniotic Fluid  AFI FV:      Within normal limits                              Largest Pocket(cm)                               5.16 ---------------------------------------------------------------------- Biophysical Evaluation (Fetus B)  Amniotic F.V:  Pocket => 2 cm             F. Tone:        Observed  F. Movement:    Observed                   N.S.T:          Reactive  F. Breathing:   Observed                   Score:          10/10 ---------------------------------------------------------------------- Gestational Age (Fetus B)  LMP:           34w 0d        Date:  12/06/21                   EDD:   09/12/22  Best:          33w 1d     Det. By:  Previous Ultrasound      EDD:   09/18/22                                      (04/06/22) ---------------------------------------------------------------------- Anatomy (Fetus B)  Cranium:               Previously seen        Heart:                  Abnormal, see                                                                        comments  Cavum:                 Previously seen        Diaphragm:              Appears normal  Ventricles:            Previously seen        Stomach:                Appears normal, left                                                                        sided  Choroid Plexus:        Previously seen        Abdomen:                Previously seen  Cerebellum:            Previously seen        Abdominal Wall:         Previously seen  Posterior Fossa:       Previously seen        Cord Vessels:           Previously seen  Nuchal Fold:  Previously seen        Kidneys:                Appear normal  Face:                  Orbits and profile     Bladder:                Appears normal                         previously seen  Lips:                  Previously seen        Spine:                  Previously seen  Palate:                Previously             Upper Extremities:      Previously seen                         visualized  Thoracic:              Appears normal         Lower Extremities:      Previously seen  Other:  Cardiac anomaly- AVSD. Hands and feet/heels  previously visualized.          Technically difficult due to advanced GA and fetal position. ---------------------------------------------------------------------- Doppler - Fetal Vessels (Fetus B)  Umbilical Artery   S/D     %tile      RI    %tile      PI    %tile     PSV    ADFV    RDFV                                                     (cm/s)   7.25   > 97.5    0.86   > 97.5    1.64   > 97.5    34.01      No      No ---------------------------------------------------------------------- Cervix Uterus Adnexa  Cervix  Not visualized (advanced GA >24wks)  Uterus  No abnormality visualized.  Right Ovary  Within normal limits.  Left Ovary  Within normal limits.  Cul De Sac  No free fluid seen.  Adnexa  No abnormality visualized ---------------------------------------------------------------------- Comments  Follow-up ultrasound for di-di twins. She has no concerns  today.  Sonographic findings  Di-di intrauterine pregnancy at 33w 1d  Fetal cardiac activity: A: Observed, B: Observed.  Presentation: A: Cephalic, B: Breech.  Internal anatomy: appears normal x 2.  Fetal biometry (estimated fetal weight):  percentile.  Amniotic fluid volume: A: Within normal limits, B: Within  normal limits.  Placentas: A: Posterior, B: Anterior.  UA dopplers: A: normal, B: elevated.  BPP: A: 8/10, B: 10/10  Recommendations  -Serial growth ultrasounds every 3-4 weeks until delivery  -Continue twice weekly testing and weekly UA dopplers  -Delivery around [redacted] weeks gestation. 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 (see fetal echo in Care  Everywhere) ----------------------------------------------------------------------  Braxton Feathers, DO Electronically Signed Final Report   08/01/2022 05:05 pm ----------------------------------------------------------------------  [place] MFM UA CORD DOPPLER  Result Date: 07/28/2022 ----------------------------------------------------------------------  OBSTETRICS REPORT                        (Signed Final 07/28/2022 05:26 pm) ---------------------------------------------------------------------- Patient Info  ID #:       [id number]                          D.O.B.:  27-Mar-1986 (37 yrs)  Name:       Monica Green                       Visit Date: 07/28/2022 12:24 pm              [place] ---------------------------------------------------------------------- Performed By  Attending:        Ma Rings MD         Secondary Phy.:   Nigel Bridgeman                                                             CNM  Performed By:     Isac Sarna        Address:          [facility] RDMS                                                             Obstetrics &                                                             Gynecology                                                             [address]                                                             [address]  [place], [place]                                                             [address]  Referred By:      Mittie Bodo            [facility]         [facility]  Ref. Address:     CCOB ---------------------------------------------------------------------- Orders  #  Description                           Code        Ordered By  1  [place] MFM UA CORD DOPPLER                76820.02    BURK SCHAIBLE  2  [place] MFM UA ADDL GEST                   76820.01    BURK SCHAIBLE  3  [place] MFM FETAL BPP WO NST               76819.1     BURK SCHAIBLE     ADDL GESTATION  4  [place] MFM FETAL BPP WO NON               76819.01    [facility]     STRESS  ----------------------------------------------------------------------  #  Order #                     Accession #                Episode #  1  [id number]                   [id number]                 [id number]  2  [id number]                   [id number]                 [id number]  3  [id number]  [id number]                 [id number]  4  [id number]                   [id number]                 [id number] ---------------------------------------------------------------------- Indications  Maternal care for known or suspected poor      O36.5931  fetal growth, third trimester, fetus 1 IUGR  Maternal care for known or suspected poor      O36.5932  fetal growth, third trimester, fetus 2 IUGR  Twin pregnancy, di/di, third trimester         O30.043  Anemia during pregnancy in third trimester     O42.013  Twin B: UTDA1, hypoplastic nasal bone,         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, EIF; Twin A: EIF  Medical complication of pregnancy              O26.90  (Thrombocytosis)  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  management of mother in third trimester  (Twin B)  Abnormal biochemical screen (quad) for         O28.9  Trisomy 64  Advanced maternal age multigravida 61+,        O86.523  third trimester (37 yrs)  Poor obstetric history: Previous               O09.299  preeclampsia / eclampsia/gestational HTN  [redacted] weeks gestation of pregnancy                Z3A.32 ---------------------------------------------------------------------- Fetal Evaluation (Fetus A)  Num Of Fetuses:         2  Fetal Heart Rate(bpm):  158  Cardiac Activity:       Observed  Fetal Lie:              Maternal right side  Presentation:           Cephalic  Placenta:               Posterior  P. Cord Insertion:      Previously visualized  Membrane Desc:      Dividing Membrane seen - Dichorionic.  Amniotic Fluid  AFI FV:      Within normal limits                              Largest Pocket(cm)                              4.97  ---------------------------------------------------------------------- Biophysical Evaluation (Fetus A)  Amniotic F.V:   Pocket => 2 cm             F. Tone:        Observed  F. Movement:    Observed                   N.S.T:          Nonreactive  F. Breathing:   Observed                   Score:          8/10 ---------------------------------------------------------------------- OB History  Blood Type:   B+  Maternal Racial/Ethnic Group:   Black (non-Hispanic)  Gravidity:    2         Term:  1        Prem:   0        SAB:   0  TOP:          0       Ectopic:  0        Living: 1 ---------------------------------------------------------------------- Gestational Age (Fetus A)  LMP:           33w 3d        Date:  12/06/21                   EDD:   09/12/22  Best:          Armida Sans 4d     Det. By:  Previous Ultrasound      EDD:   09/18/22                                      (04/06/22) ---------------------------------------------------------------------- Anatomy (Fetus A)  Cranium:               Appears normal         Aortic Arch:            Previously seen  Cavum:                 Previously seen        Ductal Arch:            Previously seen  Ventricles:            Previously seen        Diaphragm:              Appears normal  Choroid Plexus:        Previously seen        Stomach:                Appears normal, left                                                                        sided  Cerebellum:            Previously seen        Abdomen:                Previously seen  Posterior Fossa:       Previously seen        Abdominal Wall:         Previously seen  Nuchal Fold:           Previously seen        Cord Vessels:           Previously seen  Face:                  Orbits and profile     Kidneys:                Appear normal                         previously seen  Lips:  Previously seen        Bladder:                Appears normal  Thoracic:              Previously seen        Spine:                   Previously seen  Heart:                 Previously seen        Upper Extremities:      Previously seen  RVOT:                  Previously seen        Lower Extremities:      Previously seen  LVOT:                  Previously seen  Other:  IVC/SVC, 3VV, 3VTV previously visualized. Heels previously seen.          Hands and feet  previously visualized. Female gender. ---------------------------------------------------------------------- Doppler - Fetal Vessels (Fetus A)  Umbilical Artery   S/D     %tile      RI    %tile      PI    %tile     PSV    ADFV    RDFV                                                     (cm/s)   2.24       24    0.55       23     0.8       30    54.98      No      No ---------------------------------------------------------------------- Fetal Evaluation (Fetus B)  Num Of Fetuses:         2  Fetal Heart Rate(bpm):  136  Cardiac Activity:       Observed  Fetal Lie:              Maternal left side  Presentation:           Breech  Placenta:               Posterior  P. Cord Insertion:      Previously visualized  Membrane Desc:      Dividing Membrane seen - Dichorionic.  Amniotic Fluid  AFI FV:      Within normal limits                              Largest Pocket(cm)                              5 ---------------------------------------------------------------------- Biophysical Evaluation (Fetus B)  Amniotic F.V:   Pocket => 2 cm             F. Tone:        Observed  F. Movement:    Observed                   N.S.T:  Nonreactive  F. Breathing:   Observed                   Score:          8/10 ---------------------------------------------------------------------- Gestational Age (Fetus B)  LMP:           33w 3d        Date:  12/06/21                   EDD:   09/12/22  Best:          Armida Sans 4d     Det. By:  Previous Ultrasound      EDD:   09/18/22                                      (04/06/22) ---------------------------------------------------------------------- Anatomy (Fetus B)  Cranium:                Appears normal         Heart:                  Abnormal, see                                                                        comments  Cavum:                 Previously seen        Diaphragm:              Appears normal  Ventricles:            Previously seen        Stomach:                Appears normal, left                                                                        sided  Choroid Plexus:        Previously seen        Abdomen:                Previously seen  Cerebellum:            Previously seen        Abdominal Wall:         Previously seen  Posterior Fossa:       Previously seen        Cord Vessels:           Previously seen  Nuchal Fold:           Previously seen        Kidneys:                Previously seen  Face:                  Orbits and profile  Bladder:                Appears normal                         previously seen  Lips:                  Previously seen        Spine:                  Previously seen  Palate:                Previously             Upper Extremities:      Previously seen                         visualized  Thoracic:              Previously seen        Lower Extremities:      Previously seen  Other:  Cardiac anomaly- AVSD. Hands and feet  previously visualized.          Heels previously seen. ---------------------------------------------------------------------- Doppler - Fetal Vessels (Fetus B)  Umbilical Artery   S/D     %tile      RI    %tile      PI    %tile     PSV    ADFV    RDFV                                                     (cm/s)   5.14   > 97.5    0.81   > 97.5    1.49   > 97.5     32.7     Yes      No ---------------------------------------------------------------------- Comments  This patient was seen due to IUGR of twin B in a dichorionic,  diamniotic twin gestation.  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  Intermittent absent end-diastolic flow was noted in twin B on  her last umbilical artery Doppler study.  She denies any problems since her  last exam.  She reports  feeling vigorous fetal movements throughout the day.  A BPP performed today was 8 out of 10 for both twin A and  twin B.  Both twin A and twin B had nonreactive nonstress  tests.  There was normal amniotic fluid noted around twin A and twin  B.  Doppler studies of the umbilical arteries performed today  showed a normal S/D ratio of 2.24 for twin A.  There were no  signs of absent or reversed end-diastolic flow noted today in  twin A.  Intermittent absent end-diastolic flow continues to be noted in  twin B.  There were no signs of reversed end-diastolic flow  noted in twin B.  As intermittent absent end-diastolic flow is noted in twin B, we  will increase the frequency of fetal testing and umbilical artery  Doppler studies to twice a week.  The goal for her delivery would be at around 36 weeks.  She will return next week for another BPP and umbilical  artery Doppler study. ----------------------------------------------------------------------  Ma Rings, MD Electronically Signed Final Report   07/28/2022 05:26 pm ----------------------------------------------------------------------  [place] MFM UA ADDL GEST  Result Date: 07/28/2022 ----------------------------------------------------------------------  OBSTETRICS REPORT                       (Signed Final 07/28/2022 05:26 pm) ---------------------------------------------------------------------- Patient Info  ID #:       [id number]                          D.O.B.:  January 21, 1986 (37 yrs)  Name:       Monica Green                       Visit Date: 07/28/2022 12:24 pm              [place] ---------------------------------------------------------------------- Performed By  Attending:        Ma Rings MD         Secondary Phy.:   Nigel Bridgeman                                                             CNM  Performed By:     Isac Sarna        Address:          [facility] RDMS                                                              Obstetrics &                                                             Gynecology                                                             [address]                                                             [address]  [place], [place]                                                             [address]  Referred By:      Mittie Bodo            Location[facility]         [facility]  Ref. Address:     [facility] ---------------------------------------------------------------------- Orders  #  Description                           Code        Ordered By  1  [place] MFM UA CORD DOPPLER                76820.02    BURK SCHAIBLE  2  [place] MFM UA ADDL GEST                   76820.01    BURK SCHAIBLE  3  [place] MFM FETAL BPP WO NST               76819.1     BURK SCHAIBLE     ADDL GESTATION  4  [place] MFM FETAL BPP WO NON               76819.01    [facility]     STRESS ----------------------------------------------------------------------  #  Order #                     Accession #                Episode #  1  [id number]                   [id number]                 [id number]  2  [id number]                   [id number]                 [id number]  3  [id number]  [id number]                 [id number]  4  [id number]                   [id number]                 [id number] ---------------------------------------------------------------------- Indications  Maternal care for known or suspected poor      O36.5931  fetal growth, third trimester, fetus 1 IUGR  Maternal care for known or suspected poor      O36.5932  fetal growth, third trimester, fetus 2 IUGR  Twin  pregnancy, di/di, third trimester         O30.043  Anemia during pregnancy in third trimester     O39.013  Twin B: UTDA1, hypoplastic nasal bone,         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, EIF; Twin A: EIF  Medical complication of pregnancy              O26.90  (Thrombocytosis)  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  management of mother in third trimester  (Twin B)  Abnormal biochemical screen (quad) for         O28.9  Trisomy 40  Advanced maternal age multigravida 59+,        O42.523  third trimester (37 yrs)  Poor obstetric history: Previous               O09.299  preeclampsia / eclampsia/gestational HTN  [redacted] weeks gestation of pregnancy                Z3A.32 ---------------------------------------------------------------------- Fetal Evaluation (Fetus A)  Num Of Fetuses:         2  Fetal Heart Rate(bpm):  158  Cardiac Activity:       Observed  Fetal Lie:              Maternal right side  Presentation:           Cephalic  Placenta:               Posterior  P. Cord Insertion:      Previously visualized  Membrane Desc:      Dividing Membrane seen - Dichorionic.  Amniotic Fluid  AFI FV:      Within normal limits                              Largest Pocket(cm)                              4.97 ---------------------------------------------------------------------- Biophysical Evaluation (Fetus A)  Amniotic F.V:   Pocket => 2 cm             F. Tone:        Observed  F. Movement:    Observed                   N.S.T:          Nonreactive  F. Breathing:   Observed                   Score:          8/10 ---------------------------------------------------------------------- OB History  Blood Type:   B+  Maternal Racial/Ethnic Group:   Black (non-Hispanic)  Gravidity:    2         Term:  1        Prem:   0        SAB:   0  TOP:          0       Ectopic:  0        Living: 1 ---------------------------------------------------------------------- Gestational Age (Fetus A)  LMP:           33w 3d        Date:  12/06/21                    EDD:   09/12/22  Best:          Armida Sans 4d     Det. By:  Previous Ultrasound      EDD:   09/18/22                                      (04/06/22) ---------------------------------------------------------------------- Anatomy (Fetus A)  Cranium:               Appears normal         Aortic Arch:            Previously seen  Cavum:                 Previously seen        Ductal Arch:            Previously seen  Ventricles:            Previously seen        Diaphragm:              Appears normal  Choroid Plexus:        Previously seen        Stomach:                Appears normal, left                                                                        sided  Cerebellum:            Previously seen        Abdomen:                Previously seen  Posterior Fossa:       Previously seen        Abdominal Wall:         Previously seen  Nuchal Fold:           Previously seen        Cord Vessels:           Previously seen  Face:                  Orbits and profile     Kidneys:                Appear normal                         previously seen  Lips:  Previously seen        Bladder:                Appears normal  Thoracic:              Previously seen        Spine:                  Previously seen  Heart:                 Previously seen        Upper Extremities:      Previously seen  RVOT:                  Previously seen        Lower Extremities:      Previously seen  LVOT:                  Previously seen  Other:  IVC/SVC, 3VV, 3VTV previously visualized. Heels previously seen.          Hands and feet  previously visualized. Female gender. ---------------------------------------------------------------------- Doppler - Fetal Vessels (Fetus A)  Umbilical Artery   S/D     %tile      RI    %tile      PI    %tile     PSV    ADFV    RDFV                                                     (cm/s)   2.24       24    0.55       23     0.8       30    54.98      No      No  ---------------------------------------------------------------------- Fetal Evaluation (Fetus B)  Num Of Fetuses:         2  Fetal Heart Rate(bpm):  136  Cardiac Activity:       Observed  Fetal Lie:              Maternal left side  Presentation:           Breech  Placenta:               Posterior  P. Cord Insertion:      Previously visualized  Membrane Desc:      Dividing Membrane seen - Dichorionic.  Amniotic Fluid  AFI FV:      Within normal limits                              Largest Pocket(cm)                              5 ---------------------------------------------------------------------- Biophysical Evaluation (Fetus B)  Amniotic F.V:   Pocket => 2 cm             F. Tone:        Observed  F. Movement:    Observed                   N.S.T:  Nonreactive  F. Breathing:   Observed                   Score:          8/10 ---------------------------------------------------------------------- Gestational Age (Fetus B)  LMP:           33w 3d        Date:  12/06/21                   EDD:   09/12/22  Best:          Armida Sans 4d     Det. By:  Previous Ultrasound      EDD:   09/18/22                                      (04/06/22) ---------------------------------------------------------------------- Anatomy (Fetus B)  Cranium:               Appears normal         Heart:                  Abnormal, see                                                                        comments  Cavum:                 Previously seen        Diaphragm:              Appears normal  Ventricles:            Previously seen        Stomach:                Appears normal, left                                                                        sided  Choroid Plexus:        Previously seen        Abdomen:                Previously seen  Cerebellum:            Previously seen        Abdominal Wall:         Previously seen  Posterior Fossa:       Previously seen        Cord Vessels:           Previously seen  Nuchal Fold:           Previously  seen        Kidneys:                Previously seen  Face:                  Orbits and profile  Bladder:                Appears normal                         previously seen  Lips:                  Previously seen        Spine:                  Previously seen  Palate:                Previously             Upper Extremities:      Previously seen                         visualized  Thoracic:              Previously seen        Lower Extremities:      Previously seen  Other:  Cardiac anomaly- AVSD. Hands and feet  previously visualized.          Heels previously seen. ---------------------------------------------------------------------- Doppler - Fetal Vessels (Fetus B)  Umbilical Artery   S/D     %tile      RI    %tile      PI    %tile     PSV    ADFV    RDFV                                                     (cm/s)   5.14   > 97.5    0.81   > 97.5    1.49   > 97.5     32.7     Yes      No ---------------------------------------------------------------------- Comments  This patient was seen due to IUGR of twin B in a dichorionic,  diamniotic twin gestation.  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  Intermittent absent end-diastolic flow was noted in twin B on  her last umbilical artery Doppler study.  She denies any problems since her last exam.  She reports  feeling vigorous fetal movements throughout the day.  A BPP performed today was 8 out of 10 for both twin A and  twin B.  Both twin A and twin B had nonreactive nonstress  tests.  There was normal amniotic fluid noted around twin A and twin  B.  Doppler studies of the umbilical arteries performed today  showed a normal S/D ratio of 2.24 for twin A.  There were no  signs of absent or reversed end-diastolic flow noted today in  twin A.  Intermittent absent end-diastolic flow continues to be noted in  twin B.  There were no signs of reversed end-diastolic flow  noted in twin B.  As intermittent absent end-diastolic flow is noted in twin B, we  will increase the  frequency of fetal testing and umbilical artery  Doppler studies to twice a week.  The goal for her delivery would be at around 36 weeks.  She will return next week for another BPP and umbilical  artery Doppler study. ----------------------------------------------------------------------  Ma Rings, MD Electronically Signed Final Report   07/28/2022 05:26 pm ----------------------------------------------------------------------  [place] MFM FETAL BPP WO NST ADDL GESTATION  Result Date: 07/28/2022 ----------------------------------------------------------------------  OBSTETRICS REPORT                       (Signed Final 07/28/2022 05:26 pm) ---------------------------------------------------------------------- Patient Info  ID #:       [id number]                          D.O.B.:  [birth year] (37 yrs)  Name:       JAYLYNNE LAMOND                       Visit Date: 07/28/2022 12:24 pm              [place] ---------------------------------------------------------------------- Performed By  Attending:        Ma Rings MD         Secondary Phy.:   Nigel Bridgeman                                                             CNM  Performed By:     Isac Sarna        Address:          [facility] RDMS                                                             Obstetrics &                                                             Gynecology                                                             [address]                                                             [address]  [place], [place]                                                             [address]  Referred By:      Mittie Bodo            Location:         [facility]  Ref. Address:     [facility] ---------------------------------------------------------------------- Orders  #  Description                           Code        Ordered By  1  [place] MFM UA CORD DOPPLER                76820.02    BURK SCHAIBLE  2  [place] MFM UA ADDL GEST                   76820.01    BURK SCHAIBLE  3  [place] MFM FETAL BPP WO NST               76819.1     BURK SCHAIBLE     ADDL GESTATION  4  [place] MFM FETAL BPP WO NON               76819.01    [facility]     STRESS ----------------------------------------------------------------------  #  Order #                     Accession #                Episode #  1  [id number]                   [id number]                 [id number]  2  [id number]                   [id number]                 [id number]  3  [id number]  [id number]                 [id number]  4  [id number]                   [id number]                 [id number] ---------------------------------------------------------------------- Indications  Maternal care for known or suspected poor      O36.5931  fetal growth, third trimester, fetus 1 IUGR  Maternal care for known or suspected poor      O36.5932  fetal growth, third trimester, fetus 2 IUGR  Twin pregnancy, di/di, third trimester         O30.043  Anemia during pregnancy in third trimester     O38.013  Twin B: UTDA1, hypoplastic nasal bone,         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, EIF; Twin A: EIF  Medical complication of pregnancy              O26.90  (Thrombocytosis)  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  management of mother in third trimester  (Twin B)  Abnormal biochemical screen (quad) for         O28.9  Trisomy 73  Advanced maternal age multigravida 39+,        O79.523  third trimester (37 yrs)  Poor obstetric history: Previous               O09.299  preeclampsia / eclampsia/gestational HTN  [redacted] weeks gestation of pregnancy                Z3A.32  ---------------------------------------------------------------------- Fetal Evaluation (Fetus A)  Num Of Fetuses:         2  Fetal Heart Rate(bpm):  158  Cardiac Activity:       Observed  Fetal Lie:              Maternal right side  Presentation:           Cephalic  Placenta:               Posterior  P. Cord Insertion:      Previously visualized  Membrane Desc:      Dividing Membrane seen - Dichorionic.  Amniotic Fluid  AFI FV:      Within normal limits                              Largest Pocket(cm)                              4.97 ---------------------------------------------------------------------- Biophysical Evaluation (Fetus A)  Amniotic F.V:   Pocket => 2 cm             F. Tone:        Observed  F. Movement:    Observed                   N.S.T:          Nonreactive  F. Breathing:   Observed                   Score:          8/10 ---------------------------------------------------------------------- OB History  Blood Type:   B+  Maternal Racial/Ethnic Group:   Black (non-Hispanic)  Gravidity:    2         Term:  1        Prem:   0        SAB:   0  TOP:          0       Ectopic:  0        Living: 1 ---------------------------------------------------------------------- Gestational Age (Fetus A)  LMP:           33w 3d        Date:  12/06/21                   EDD:   09/12/22  Best:          Armida Sans 4d     Det. By:  Previous Ultrasound      EDD:   09/18/22                                      (04/06/22) ---------------------------------------------------------------------- Anatomy (Fetus A)  Cranium:               Appears normal         Aortic Arch:            Previously seen  Cavum:                 Previously seen        Ductal Arch:            Previously seen  Ventricles:            Previously seen        Diaphragm:              Appears normal  Choroid Plexus:        Previously seen        Stomach:                Appears normal, left                                                                        sided   Cerebellum:            Previously seen        Abdomen:                Previously seen  Posterior Fossa:       Previously seen        Abdominal Wall:         Previously seen  Nuchal Fold:           Previously seen        Cord Vessels:           Previously seen  Face:                  Orbits and profile     Kidneys:                Appear normal                         previously seen  Lips:  Previously seen        Bladder:                Appears normal  Thoracic:              Previously seen        Spine:                  Previously seen  Heart:                 Previously seen        Upper Extremities:      Previously seen  RVOT:                  Previously seen        Lower Extremities:      Previously seen  LVOT:                  Previously seen  Other:  IVC/SVC, 3VV, 3VTV previously visualized. Heels previously seen.          Hands and feet  previously visualized. Female gender. ---------------------------------------------------------------------- Doppler - Fetal Vessels (Fetus A)  Umbilical Artery   S/D     %tile      RI    %tile      PI    %tile     PSV    ADFV    RDFV                                                     (cm/s)   2.24       24    0.55       23     0.8       30    54.98      No      No ---------------------------------------------------------------------- Fetal Evaluation (Fetus B)  Num Of Fetuses:         2  Fetal Heart Rate(bpm):  136  Cardiac Activity:       Observed  Fetal Lie:              Maternal left side  Presentation:           Breech  Placenta:               Posterior  P. Cord Insertion:      Previously visualized  Membrane Desc:      Dividing Membrane seen - Dichorionic.  Amniotic Fluid  AFI FV:      Within normal limits                              Largest Pocket(cm)                              5 ---------------------------------------------------------------------- Biophysical Evaluation (Fetus B)  Amniotic F.V:   Pocket => 2 cm             F. Tone:        Observed  F.  Movement:    Observed                   N.S.T:  Nonreactive  F. Breathing:   Observed                   Score:          8/10 ---------------------------------------------------------------------- Gestational Age (Fetus B)  LMP:           33w 3d        Date:  12/06/21                   EDD:   09/12/22  Best:          Armida Sans 4d     Det. By:  Previous Ultrasound      EDD:   09/18/22                                      (04/06/22) ---------------------------------------------------------------------- Anatomy (Fetus B)  Cranium:               Appears normal         Heart:                  Abnormal, see                                                                        comments  Cavum:                 Previously seen        Diaphragm:              Appears normal  Ventricles:            Previously seen        Stomach:                Appears normal, left                                                                        sided  Choroid Plexus:        Previously seen        Abdomen:                Previously seen  Cerebellum:            Previously seen        Abdominal Wall:         Previously seen  Posterior Fossa:       Previously seen        Cord Vessels:           Previously seen  Nuchal Fold:           Previously seen        Kidneys:                Previously seen  Face:                  Orbits and profile  Bladder:                Appears normal                         previously seen  Lips:                  Previously seen        Spine:                  Previously seen  Palate:                Previously             Upper Extremities:      Previously seen                         visualized  Thoracic:              Previously seen        Lower Extremities:      Previously seen  Other:  Cardiac anomaly- AVSD. Hands and feet  previously visualized.          Heels previously seen. ---------------------------------------------------------------------- Doppler - Fetal Vessels (Fetus B)  Umbilical Artery   S/D      %tile      RI    %tile      PI    %tile     PSV    ADFV    RDFV                                                     (cm/s)   5.14   > 97.5    0.81   > 97.5    1.49   > 97.5     32.7     Yes      No ---------------------------------------------------------------------- Comments  This patient was seen due to IUGR of twin B in a dichorionic,  diamniotic twin gestation.  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  Intermittent absent end-diastolic flow was noted in twin B on  her last umbilical artery Doppler study.  She denies any problems since her last exam.  She reports  feeling vigorous fetal movements throughout the day.  A BPP performed today was 8 out of 10 for both twin A and  twin B.  Both twin A and twin B had nonreactive nonstress  tests.  There was normal amniotic fluid noted around twin A and twin  B.  Doppler studies of the umbilical arteries performed today  showed a normal S/D ratio of 2.24 for twin A.  There were no  signs of absent or reversed end-diastolic flow noted today in  twin A.  Intermittent absent end-diastolic flow continues to be noted in  twin B.  There were no signs of reversed end-diastolic flow  noted in twin B.  As intermittent absent end-diastolic flow is noted in twin B, we  will increase the frequency of fetal testing and umbilical artery  Doppler studies to twice a week.  The goal for her delivery would be at around 36 weeks.  She will return next week for another BPP and umbilical  artery Doppler study. ----------------------------------------------------------------------  Ma Rings, MD Electronically Signed Final Report   07/28/2022 05:26 pm ----------------------------------------------------------------------  [place] MFM FETAL BPP WO NON STRESS  Result Date: 07/28/2022 ----------------------------------------------------------------------  OBSTETRICS REPORT                       (Signed Final 07/28/2022 05:26 pm)  ---------------------------------------------------------------------- Patient Info  ID #:       [id number]                          D.O.B.:  07/27/1985 (37 yrs)  Name:       TANJANIKA SJOBERG                       Visit Date: 07/28/2022 12:24 pm              [place] ---------------------------------------------------------------------- Performed By  Attending:        Ma Rings MD         Secondary Phy.:   Nigel Bridgeman                                                             CNM  Performed By:     Isac Sarna        Address:          [facility]                                                             Obstetrics &                                                             Gynecology                                                             [address]                                                             [address]  [place], [place]                                                             [address]  Referred By:      Mittie Bodo            Location[facility]         [facility]  Ref. Address:     [facility] ---------------------------------------------------------------------- Orders  #  Description                           Code        Ordered By  1  [place] MFM UA CORD DOPPLER                76820.02    BURK SCHAIBLE  2  [place] MFM UA ADDL GEST                   76820.01    BURK SCHAIBLE  3  [place] MFM FETAL BPP WO NST               76819.1     BURK SCHAIBLE     ADDL GESTATION  4  [place] MFM FETAL BPP WO NON               76819.01    [facility]     STRESS ----------------------------------------------------------------------  #  Order #                      Accession #                Episode #  1  [id number]                   [id number]                 [id number]  2  [id number]                   [id number]                 [id number]  3  [id number]  [id number]                 [id number]  4  [id number]                   [id number]                 [id number] ---------------------------------------------------------------------- Indications  Maternal care for known or suspected poor      O36.5931  fetal growth, third trimester, fetus 1 IUGR  Maternal care for known or suspected poor      O36.5932  fetal growth, third trimester, fetus 2 IUGR  Twin pregnancy, di/di, third trimester         O30.043  Anemia during pregnancy in third trimester     O50.013  Twin B: UTDA1, hypoplastic nasal bone,         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, EIF; Twin A: EIF  Medical complication of pregnancy              O26.90  (Thrombocytosis)  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  management of mother in third trimester  (Twin B)  Abnormal biochemical screen (quad) for         O28.9  Trisomy 11  Advanced maternal age multigravida 52+,        O47.523  third trimester (37 yrs)  Poor obstetric history: Previous               O09.299  preeclampsia / eclampsia/gestational HTN  [redacted] weeks gestation of pregnancy                Z3A.32 ---------------------------------------------------------------------- Fetal Evaluation (Fetus A)  Num Of Fetuses:         2  Fetal Heart Rate(bpm):  158  Cardiac Activity:       Observed  Fetal Lie:              Maternal right side  Presentation:           Cephalic  Placenta:               Posterior  P. Cord Insertion:      Previously visualized  Membrane Desc:      Dividing Membrane seen - Dichorionic.  Amniotic Fluid  AFI FV:      Within normal limits                              Largest Pocket(cm)                              4.97 ---------------------------------------------------------------------- Biophysical Evaluation (Fetus A)  Amniotic F.V:   Pocket => 2  cm             F. Tone:        Observed  F. Movement:    Observed                   N.S.T:          Nonreactive  F. Breathing:   Observed                   Score:          8/10 ---------------------------------------------------------------------- OB History  Blood Type:   B+  Maternal Racial/Ethnic Group:   Black (non-Hispanic)  Gravidity:    2         Term:  1        Prem:   0        SAB:   0  TOP:          0       Ectopic:  0        Living: 1 ---------------------------------------------------------------------- Gestational Age (Fetus A)  LMP:           33w 3d        Date:  12/06/21                   EDD:   09/12/22  Best:          Armida Sans 4d     Det. By:  Previous Ultrasound      EDD:   09/18/22                                      (04/06/22) ---------------------------------------------------------------------- Anatomy (Fetus A)  Cranium:               Appears normal         Aortic Arch:            Previously seen  Cavum:                 Previously seen        Ductal Arch:            Previously seen  Ventricles:            Previously seen        Diaphragm:              Appears normal  Choroid Plexus:        Previously seen        Stomach:                Appears normal, left                                                                        sided  Cerebellum:            Previously seen        Abdomen:                Previously seen  Posterior Fossa:       Previously seen        Abdominal Wall:         Previously seen  Nuchal Fold:           Previously seen        Cord Vessels:           Previously seen  Face:                  Orbits and profile     Kidneys:                Appear normal                         previously seen  Lips:  Previously seen        Bladder:                Appears normal  Thoracic:              Previously seen        Spine:                  Previously seen  Heart:                 Previously seen        Upper Extremities:      Previously seen  RVOT:                  Previously  seen        Lower Extremities:      Previously seen  LVOT:                  Previously seen  Other:  IVC/SVC, 3VV, 3VTV previously visualized. Heels previously seen.          Hands and feet  previously visualized. Female gender. ---------------------------------------------------------------------- Doppler - Fetal Vessels (Fetus A)  Umbilical Artery   S/D     %tile      RI    %tile      PI    %tile     PSV    ADFV    RDFV                                                     (cm/s)   2.24       24    0.55       23     0.8       30    54.98      No      No ---------------------------------------------------------------------- Fetal Evaluation (Fetus B)  Num Of Fetuses:         2  Fetal Heart Rate(bpm):  136  Cardiac Activity:       Observed  Fetal Lie:              Maternal left side  Presentation:           Breech  Placenta:               Posterior  P. Cord Insertion:      Previously visualized  Membrane Desc:      Dividing Membrane seen - Dichorionic.  Amniotic Fluid  AFI FV:      Within normal limits                              Largest Pocket(cm)                              5 ---------------------------------------------------------------------- Biophysical Evaluation (Fetus B)  Amniotic F.V:   Pocket => 2 cm             F. Tone:        Observed  F. Movement:    Observed                   N.S.T:  Nonreactive  F. Breathing:   Observed                   Score:          8/10 ---------------------------------------------------------------------- Gestational Age (Fetus B)  LMP:           33w 3d        Date:  12/06/21                   EDD:   09/12/22  Best:          Armida Sans 4d     Det. By:  Previous Ultrasound      EDD:   09/18/22                                      (04/06/22) ---------------------------------------------------------------------- Anatomy (Fetus B)  Cranium:               Appears normal         Heart:                  Abnormal, see                                                                         comments  Cavum:                 Previously seen        Diaphragm:              Appears normal  Ventricles:            Previously seen        Stomach:                Appears normal, left                                                                        sided  Choroid Plexus:        Previously seen        Abdomen:                Previously seen  Cerebellum:            Previously seen        Abdominal Wall:         Previously seen  Posterior Fossa:       Previously seen        Cord Vessels:           Previously seen  Nuchal Fold:           Previously seen        Kidneys:                Previously seen  Face:                  Orbits and profile  Bladder:                Appears normal                         previously seen  Lips:                  Previously seen        Spine:                  Previously seen  Palate:                Previously             Upper Extremities:      Previously seen                         visualized  Thoracic:              Previously seen        Lower Extremities:      Previously seen  Other:  Cardiac anomaly- AVSD. Hands and feet  previously visualized.          Heels previously seen. ---------------------------------------------------------------------- Doppler - Fetal Vessels (Fetus B)  Umbilical Artery   S/D     %tile      RI    %tile      PI    %tile     PSV    ADFV    RDFV                                                     (cm/s)   5.14   > 97.5    0.81   > 97.5    1.49   > 97.5     32.7     Yes      No ---------------------------------------------------------------------- Comments  This patient was seen due to IUGR of twin B in a dichorionic,  diamniotic twin gestation.  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  Intermittent absent end-diastolic flow was noted in twin B on  her last umbilical artery Doppler study.  She denies any problems since her last exam.  She reports  feeling vigorous fetal movements throughout the day.  A BPP performed today was 8 out of 10 for both twin A and   twin B.  Both twin A and twin B had nonreactive nonstress  tests.  There was normal amniotic fluid noted around twin A and twin  B.  Doppler studies of the umbilical arteries performed today  showed a normal S/D ratio of 2.24 for twin A.  There were no  signs of absent or reversed end-diastolic flow noted today in  twin A.  Intermittent absent end-diastolic flow continues to be noted in  twin B.  There were no signs of reversed end-diastolic flow  noted in twin B.  As intermittent absent end-diastolic flow is noted in twin B, we  will increase the frequency of fetal testing and umbilical artery  Doppler studies to twice a week.  The goal for her delivery would be at around 36 weeks.  She will return next week for another BPP and umbilical  artery Doppler study. ----------------------------------------------------------------------  Ma Rings, MD Electronically Signed Final Report   07/28/2022 05:26 pm ----------------------------------------------------------------------  [place] MFM FETAL BPP WO NON STRESS  Result Date: 07/26/2022 ----------------------------------------------------------------------  OBSTETRICS REPORT                       (Signed Final 07/26/2022 04:25 pm) ---------------------------------------------------------------------- Patient Info  ID #:       [id number]                          D.O.B.:  31-Aug-1985 (37 yrs)  Name:       LANAYSHA ORGAN                       Visit Date: 07/26/2022 01:34 pm              [place] ---------------------------------------------------------------------- Performed By  Attending:        Braxton Feathers DO       Secondary Phy.:   Nigel Bridgeman                                                             CNM  Performed By:     Kris Hartmann,      Address:          [facility]                    RD[facility]                                                             Obstetrics &                                                             Gynecology                                                              [address].                                                             [address]  [place], [place]                                                             [address]  Referred By:      Mittie Bodo            Location[facility]         [facility]  Ref. Address:     [facility] ---------------------------------------------------------------------- Orders  #  Description                           Code        Ordered By  1  [place] MFM FETAL BPP WO NON               76819.01    RAVI SHANKAR     STRESS  2  [place] MFM FETAL BPP WO NST               60454.0     RAVI SHANKAR     ADDL GESTATION  3  [place] MFM OB FOLLOW UP                   76816.01    RAVI SHANKAR  4  [place] MFM OB FOLLOW UP ADDL              98119.14    RAVI SHANKAR     GEST  5  [place] MFM UA CORD DOPPLER                76820.02    RAVI SHANKAR  6  [place] MFM UA ADDL GEST                   76820.01    RAVI SHANKAR ----------------------------------------------------------------------  #  Order #                     Accession #                Episode #  1  [id number]                   [id number]  [id number]  2  [id number]                   [id number]                 [id number]  3  [id number]                   [id number]                 [id number]  4  [id number]                   [id number]                 [id number]  5  [id number]                   [id number]                 [id number]  6  [id number]                   [id number]                 [id number] ---------------------------------------------------------------------- Indications  [redacted] weeks gestation of pregnancy                Z3A.32  Maternal care  for known or suspected poor      O36.5931  fetal growth, third trimester, fetus 1 IUGR  Maternal care for known or suspected poor      O36.5932  fetal growth, third trimester, fetus 2 IUGR  Twin pregnancy, di/di, third trimester         O30.043  Anemia during pregnancy in third trimester     O72.013  Twin B: UTDA1, hypoplastic nasal bone,         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, EIF; Twin A: EIF  Medical complication of pregnancy              O26.90  (Thrombocytosis)  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 Cord Insertion:      Previously visualized  Amniotic Fluid  AFI FV:      Within normal limits  AFI Sum(cm)     %Tile       Largest Pocket(cm)  9.9             16          5.28  RUQ(cm)                     LUQ(cm)  5.28  4.62 ---------------------------------------------------------------------- Biometry (Fetus A)  BPD:      82.3  mm     G. Age:  33w 1d         66  %    CI:        76.19   %    70 - 86                                                          FL/HC:      19.2   %    19.1 - 21.3  HC:      298.8  mm     G. Age:  33w 1d         33  %    HC/AC:      1.11        0.96 - 1.17  AC:       269   mm     G. Age:  31w 0d         15  %    FL/BPD:     69.7   %    71 - 87  FL:       57.4  mm     G. Age:  30w 1d        2.4  %    FL/AC:      21.3   %    20 - 24  HUM:      50.6  mm     G. Age:  29w 5d        < 5  %  Est. FW:    1695   gm    3 lb 12 oz      11  %     FW Discordancy     0 \ 11 % ---------------------------------------------------------------------- OB History  Blood Type:   B+  Maternal Racial/Ethnic Group:   Black (non-Hispanic)  Gravidity:    2         Term:   1        Prem:   0        SAB:   0  TOP:          0       Ectopic:  0        Living: 1 ---------------------------------------------------------------------- Gestational Age (Fetus A)  LMP:           33w 1d        Date:  12/06/21                   EDD:   09/12/22  U/S Today:     31w 6d                                        EDD:   09/21/22  Best:          32w 2d     Det. By:  Previous Ultrasound      EDD:   09/18/22                                      (  04/06/22) ---------------------------------------------------------------------- Anatomy (Fetus A)  Cranium:               Appears normal         Aortic Arch:            Previously seen  Cavum:                 Previously seen        Ductal Arch:            Previously seen  Ventricles:            Previously seen        Diaphragm:              Appears normal  Choroid Plexus:        Previously seen        Stomach:                Appears normal, left                                                                        sided  Cerebellum:            Previously seen        Abdomen:                Appears normal  Posterior Fossa:       Previously seen        Abdominal Wall:         Appears nml (cord                                                                        insert, abd wall)  Nuchal Fold:           Previously seen        Cord Vessels:           Appears normal (3                                                                        vessel cord)  Face:                  Orbits and profile     Kidneys:                Appear normal                         previously seen  Lips:                  Previously seen        Bladder:  Appears normal  Thoracic:              Appears normal         Spine:                   Previously seen  Heart:                 Previously seen        Upper Extremities:      Previously seen  RVOT:                  Previously seen        Lower Extremities:      Previously seen  LVOT:                  Previously seen  Other:  IVC/SVC, 3VV, 3VTV previously visualized. Heels previously seen.          Hands and feet  previously visualized. Female gender. ---------------------------------------------------------------------- Doppler - Fetal Vessels (Fetus A)  Umbilical Artery   S/D     %tile      RI    %tile      PI    %tile     PSV    ADFV    RDFV                                                     (cm/s)   2.59       46    0.61       51    0.95       61    37.42      No      No ---------------------------------------------------------------------- Fetal Evaluation (Fetus B)  Num Of Fetuses:         2  Fetal Heart Rate(bpm):  155  Cardiac Activity:       Observed  Presentation:           Breech, maternal left  Placenta:               Posterior  P. Cord Insertion:      Previously visualized  Membrane Desc:      Dividing Membrane seen - Dichorionic.  Amniotic Fluid  AFI FV:      Within normal limits                              Largest Pocket(cm)                              6.12 ---------------------------------------------------------------------- Biometry (Fetus B)  BPD:      77.2  mm     G. Age:  31w 0d         10  %    CI:        75.47   %    70 - 86                                                          FL/HC:  19.9   %    19.1 - 21.3  HC:      281.8  mm     G. Age:  30w 6d        1.6  %    HC/AC:      1.08        0.96 - 1.17  AC:      260.4  mm     G. Age:  30w 1d        4.7  %    FL/BPD:     72.7   %    71 - 87  FL:       56.1  mm     G. Age:  29w 4d        < 1  %    FL/AC:      21.5   %    20 - 24  HUM:        48  mm     G. Age:  28w 1d        < 5  %  LV:        7.4  mm  Est. FW:    1510  gm      3 lb 5 oz    2.5  %     FW Discordancy        11  %  ---------------------------------------------------------------------- Gestational Age (Fetus B)  LMP:           33w 1d        Date:  12/06/21                   EDD:   09/12/22  U/S Today:     30w 3d                                        EDD:   10/01/22  Best:          32w 2d     Det. By:  Previous Ultrasound      EDD:   09/18/22                                      (04/06/22) ---------------------------------------------------------------------- Anatomy (Fetus B)  Cranium:               Appears normal         Heart:                  Abnormal, see                                                                        comments  Cavum:                 Previously seen        Diaphragm:              Appears normal  Ventricles:            Previously seen  Stomach:                Appears normal, left                                                                        sided  Choroid Plexus:        Previously seen        Abdomen:                Appears normal  Cerebellum:            Previously seen        Abdominal Wall:         Previously seen  Posterior Fossa:       Previously seen        Cord Vessels:           Appears normal (3                                                                        vessel cord)  Nuchal Fold:           Previously seen        Kidneys:                Previously seen  Face:                  Appears normal         Bladder:                Appears normal                         (orbits and profile)  Lips:                  Previously seen        Spine:                  Previously seen  Palate:                Previously             Upper Extremities:      Previously seen                         visualized  Thoracic:              Previously seen        Lower Extremities:      Previously seen  Other:  Cardiac anomaly- AVSD. Hands and feet  previously visualized.          Heels previously seen. ---------------------------------------------------------------------- Doppler - Fetal Vessels (Fetus  B)  Umbilical Artery  ADFV                                                                Yes  Comment:    Intermittent absent end diastolic flow noted. ---------------------------------------------------------------------- Cervix Uterus Adnexa  Right Ovary  Not visualized.  Left Ovary  Not visualized. ---------------------------------------------------------------------- Comments  Follow-up ultrasound for di-di twins. Both twins are small but  only twin B is growth restricted and is likely the twin that is  high risk for T21. She has no concerns today.  Sonographic findings  Di-di intrauterine pregnancy at 32w 2d  Fetal cardiac activity: A: Observed, B: Observed.  Presentation: A: Cephalic, maternal right, B: Breech,  maternal left.  Twin A has an EIF; Twin B has an AV canal/complex  inlet/single ventricle  UTDA1, EIF, UTDA1 and absent nasal  bone.  Fetal biometry (estimated fetal weight): A: 11, B: 2.5  percentile.  Amniotic fluid volume: A: Within normal limits, B: Within  normal limits.  Placentas: A: Posterior, B: Posterior.  UA dopplers: A: normal, B: elevated (with two poor quality  dopplers that have intermittent absent)  BPP: A: 10/10, B: 8/10 (NST reassuring but not reactive)  Recommendations  I discussed the ultrasound findings and stillbirth risk and need  for close monitoring. She will monitor fetal movement and  come back in two days for a repeat ultrasound.  -Follow up in two days for BPP with NST and UA dopplers. If  there is no absent flow she can resume weekly testing. If  there is absent flow steroids should be considered for fetal  lung maturity.  -Per peds cardiology: "After delivery, the baby should not  need to be maintained on prostaglandin E to maintain ductal  patency. A postnatal echo will need to be performed to  reassess all the cardiac anatomy, focusing on the size of the  septal defects and anatomy of the AV valve including  chordal  attachments. After appropriate time to transition and  depending on how premature the baby is would most likely  be the major influences of her postnatal hospital stay. After  discharge, we would follow as an outpatient, with planning of  a complete repair usually between 4 and 6 months and when  the baby weighs around 5 kg." ----------------------------------------------------------------------                  Braxton Feathers, DO Electronically Signed Final Report   07/26/2022 04:25 pm ----------------------------------------------------------------------  [place] MFM FETAL BPP WO NST ADDL GESTATION  Result Date: 07/26/2022 ----------------------------------------------------------------------  OBSTETRICS REPORT                       (Signed Final 07/26/2022 04:25 pm) ---------------------------------------------------------------------- Patient Info  ID #:       [id number]                          D.O.B.:  1986-03-05 (37 yrs)  Name:       Monica Green                       Visit Date: 07/26/2022 01:34 pm              [place] ----------------------------------------------------------------------  Performed By  Attending:        Braxton Feathers DO       Secondary Phy.:   Nigel Bridgeman                                                             CNM  Performed By:     Kris Hartmann,      Address:          [facility]                                                             Obstetrics &                                                             Gynecology                                                             [address]                                                             [address]                                                             [place], [place]                                                             [address]  Referred By:      Mittie Bodo            Location:         [facility]                                                             [facility]                                                             [facility]  Ref. Address:     [facility] ---------------------------------------------------------------------- Orders  #  Description                           Code        Ordered By  1  [place] MFM FETAL BPP WO NON               76819.01    RAVI SHANKAR     STRESS  2  [place] MFM FETAL BPP WO NST               76819.1     RAVI SHANKAR     ADDL GESTATION  3  [place] MFM OB FOLLOW UP                   76816.01    RAVI SHANKAR  4  [place] MFM OB FOLLOW UP ADDL              [address].60    RAVI SHANKAR     GEST  5  [place] MFM UA CORD DOPPLER                76820.02    RAVI SHANKAR  6  [place] MFM UA ADDL GEST                   76820.01    RAVI SHANKAR ----------------------------------------------------------------------  #  Order #                     Accession #                Episode #  1  [id number]                   [id number]                 [id number]  2  [id number]                   [id number]                 [id number]  3  [id number]                   [id number]                 [id number]  4  [id number]                   [id number]                 [id number]  5  [id number]                   [id number]  [id number]  6  [id number]                   [id number]                 [id number] ---------------------------------------------------------------------- Indications  [redacted] weeks gestation of pregnancy                Z3A.32  Maternal care for known or suspected poor      O36.5931  fetal growth, third trimester, fetus 1 IUGR  Maternal care for known or suspected poor      O36.5932  fetal growth, third trimester, fetus 2 IUGR  Twin pregnancy, di/di, third trimester         O30.043  Anemia during pregnancy in third trimester     O60.013  Twin B: UTDA1, hypoplastic nasal bone,         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, EIF; Twin A: EIF  Medical complication of pregnancy               O26.90  (Thrombocytosis)  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 Cord Insertion:      Previously visualized  Amniotic Fluid  AFI FV:      Within normal limits  AFI Sum(cm)     %Tile       Largest Pocket(cm)  9.9             16          5.28  RUQ(cm)                     LUQ(cm)  5.28                        4.62 ---------------------------------------------------------------------- Biometry (Fetus A)  BPD:      82.3  mm     G. Age:  33w 1d         66  %    CI:        76.19   %    70 - 86                                                          FL/HC:      19.2   %    19.1 - 21.3  HC:      298.8  mm     G. Age:  33w 1d         33  %  HC/AC:      1.11        0.96 - 1.17  AC:       269   mm     G. Age:  31w 0d         15  %    FL/BPD:     69.7   %    71 - 87  FL:       57.4  mm     G. Age:  30w 1d        2.4  %    FL/AC:      21.3   %    20 - 24  HUM:      50.6  mm     G. Age:  29w 5d        < 5  %  Est. FW:    1695  gm    3 lb 12 oz      11  %     FW Discordancy     0 \ 11 % ---------------------------------------------------------------------- OB History  Blood Type:   B+  Maternal Racial/Ethnic Group:   Black (non-Hispanic)  Gravidity:    2         Term:   1        Prem:   0        SAB:   0  TOP:          0       Ectopic:  0        Living: 1 ---------------------------------------------------------------------- Gestational Age (Fetus  A)  LMP:           33w 1d        Date:  12/06/21                   EDD:   09/12/22  U/S Today:     31w 6d                                        EDD:   09/21/22  Best:          32w 2d     Det. By:  Previous Ultrasound      EDD:   09/18/22                                      (04/06/22) ---------------------------------------------------------------------- Anatomy (Fetus A)  Cranium:               Appears normal         Aortic Arch:            Previously seen  Cavum:                 Previously seen        Ductal Arch:            Previously seen  Ventricles:            Previously seen        Diaphragm:              Appears normal  Choroid Plexus:        Previously seen        Stomach:  Appears normal, left                                                                        sided  Cerebellum:            Previously seen        Abdomen:                Appears normal  Posterior Fossa:       Previously seen        Abdominal Wall:         Appears nml (cord                                                                        insert, abd wall)  Nuchal Fold:           Previously seen        Cord Vessels:           Appears normal (3                                                                        vessel cord)  Face:                  Orbits and profile     Kidneys:                Appear normal                         previously seen  Lips:                  Previously seen        Bladder:                Appears normal  Thoracic:              Appears normal         Spine:                  Previously seen  Heart:                 Previously seen        Upper Extremities:      Previously seen  RVOT:                  Previously seen        Lower Extremities:      Previously seen  LVOT:                  Previously seen  Other:  IVC/SVC, 3VV, 3VTV previously visualized. Heels previously seen.  Hands and feet  previously visualized. Female gender.  ---------------------------------------------------------------------- Doppler - Fetal Vessels (Fetus A)  Umbilical Artery   S/D     %tile      RI    %tile      PI    %tile     PSV    ADFV    RDFV                                                     (cm/s)   2.59       46    0.61       51    0.95       61    37.42      No      No ---------------------------------------------------------------------- Fetal Evaluation (Fetus B)  Num Of Fetuses:         2  Fetal Heart Rate(bpm):  155  Cardiac Activity:       Observed  Presentation:           Breech, maternal left  Placenta:               Posterior  P. Cord Insertion:      Previously visualized  Membrane Desc:      Dividing Membrane seen - Dichorionic.  Amniotic Fluid  AFI FV:      Within normal limits                              Largest Pocket(cm)                              6.12 ---------------------------------------------------------------------- Biometry (Fetus B)  BPD:      77.2  mm     G. Age:  31w 0d         10  %    CI:        75.47   %    70 - 86                                                          FL/HC:      19.9   %    19.1 - 21.3  HC:      281.8  mm     G. Age:  30w 6d        1.6  %    HC/AC:      1.08        0.96 - 1.17  AC:      260.4  mm     G. Age:  30w 1d        4.7  %    FL/BPD:     72.7   %    71 - 87  FL:       56.1  mm     G. Age:  29w 4d        < 1  %    FL/AC:      21.5   %    20 - 24  HUM:        48  mm     G. Age:  28w 1d        < 5  %  LV:        7.4  mm  Est. FW:    1510  gm      3 lb 5 oz    2.5  %     FW Discordancy        11  % ---------------------------------------------------------------------- Gestational Age (Fetus B)  LMP:           33w 1d        Date:  12/06/21                   EDD:   09/12/22  U/S Today:     30w 3d                                        EDD:   10/01/22  Best:          32w 2d     Det. By:  Previous Ultrasound      EDD:   09/18/22                                      (04/06/22)  ---------------------------------------------------------------------- Anatomy (Fetus B)  Cranium:               Appears normal         Heart:                  Abnormal, see                                                                        comments  Cavum:                 Previously seen        Diaphragm:              Appears normal  Ventricles:            Previously seen        Stomach:                Appears normal, left                                                                        sided  Choroid Plexus:        Previously seen        Abdomen:                Appears normal  Cerebellum:            Previously seen        Abdominal Wall:  Previously seen  Posterior Fossa:       Previously seen        Cord Vessels:           Appears normal (3                                                                        vessel cord)  Nuchal Fold:           Previously seen        Kidneys:                Previously seen  Face:                  Appears normal         Bladder:                Appears normal                         (orbits and profile)  Lips:                  Previously seen        Spine:                  Previously seen  Palate:                Previously             Upper Extremities:      Previously seen                         visualized  Thoracic:              Previously seen        Lower Extremities:      Previously seen  Other:  Cardiac anomaly- AVSD. Hands and feet  previously visualized.          Heels previously seen. ---------------------------------------------------------------------- Doppler - Fetal Vessels (Fetus B)  Umbilical Artery                                                              ADFV                                                                Yes  Comment:    Intermittent absent end diastolic flow noted. ---------------------------------------------------------------------- Cervix Uterus Adnexa  Right Ovary  Not visualized.  Left Ovary  Not visualized.  ---------------------------------------------------------------------- Comments  Follow-up ultrasound for di-di twins. Both twins are small but  only twin B is growth restricted and is likely the twin that is  high risk for T21. She has no concerns today.  Sonographic findings  Di-di intrauterine pregnancy at  32w 2d  Fetal cardiac activity: A: Observed, B: Observed.  Presentation: A: Cephalic, maternal right, B: Breech,  maternal left.  Twin A has an EIF; Twin B has an AV canal/complex  inlet/single ventricle  UTDA1, EIF, UTDA1 and absent nasal  bone.  Fetal biometry (estimated fetal weight): A: 11, B: 2.5  percentile.  Amniotic fluid volume: A: Within normal limits, B: Within  normal limits.  Placentas: A: Posterior, B: Posterior.  UA dopplers: A: normal, B: elevated (with two poor quality  dopplers that have intermittent absent)  BPP: A: 10/10, B: 8/10 (NST reassuring but not reactive)  Recommendations  I discussed the ultrasound findings and stillbirth risk and need  for close monitoring. She will monitor fetal movement and  come back in two days for a repeat ultrasound.  -Follow up in two days for BPP with NST and UA dopplers. If  there is no absent flow she can resume weekly testing. If  there is absent flow steroids should be considered for fetal  lung maturity.  -Per peds cardiology: "After delivery, the baby should not  need to be maintained on prostaglandin E to maintain ductal  patency. A postnatal echo will need to be performed to  reassess all the cardiac anatomy, focusing on the size of the  septal defects and anatomy of the AV valve including chordal  attachments. After appropriate time to transition and  depending on how premature the baby is would most likely  be the major influences of her postnatal hospital stay. After  discharge, we would follow as an outpatient, with planning of  a complete repair usually between 4 and 6 months and when  the baby weighs around 5 kg."  ----------------------------------------------------------------------                  Braxton Feathers, DO Electronically Signed Final Report   07/26/2022 04:25 pm ----------------------------------------------------------------------  [place] MFM OB FOLLOW UP  Result Date: 07/26/2022 ----------------------------------------------------------------------  OBSTETRICS REPORT                       (Signed Final 07/26/2022 04:25 pm) ---------------------------------------------------------------------- Patient Info  ID #:       [id number]                          D.O.B.:  1985-06-02 (37 yrs)  Name:       Monica Green                       Visit Date: 07/26/2022 01:34 pm              [place] ---------------------------------------------------------------------- Performed By  Attending:        Braxton Feathers DO       Secondary Phy.:   Nigel Bridgeman                                                             CNM  Performed By:     Kris Hartmann,      Address:          [facility]                    [facility]  Obstetrics &                                                             Gynecology                                                             [address]                                                             [address]                                                             [place], [place]                                                             [address]  Referred By:      Mittie Bodo            Location[facility]         [facility]                                                             [facility]                                                             [facility]  Ref. Address:     [facility] ---------------------------------------------------------------------- Orders  #  Description                            Code        Ordered By  1  [place] MFM FETAL BPP WO NON               76819.01    RAVI SHANKAR     STRESS  2  [place] MFM FETAL BPP WO NST               76819.1     RAVI SHANKAR     ADDL GESTATION  3  [place] MFM OB FOLLOW UP                   76816.01    RAVI SHANKAR  4  [place] MFM OB FOLLOW UP ADDL              [address].60    RAVI SHANKAR     GEST  5  [place] MFM UA CORD DOPPLER                76820.02    RAVI SHANKAR  6  [place] MFM UA ADDL GEST                   76820.01    RAVI SHANKAR ----------------------------------------------------------------------  #  Order #                     Accession #                Episode #  1  [id number]                   [id number]                 [id number]  2  [id number]                   [id number]                 [id number]  3  [id number]                   [id number]                 [id number]  4  [id number]                   [id number]                 [id number]  5  [id number]                   [id number]                 [id number]  6  [id number]                   [id number]                 [id number] ---------------------------------------------------------------------- Indications  [redacted] weeks gestation of pregnancy                Z3A.32  Maternal care for known or suspected poor      O36.5931  fetal growth, third trimester, fetus 1 IUGR  Maternal care for known or suspected poor      O36.5932  fetal growth, third trimester, fetus 2 IUGR  Twin pregnancy, di/di, third trimester  O30.043  Anemia during pregnancy in third trimester     O99.013  Twin B: UTDA1, hypoplastic nasal bone,         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, EIF; Twin A: EIF  Medical complication of pregnancy              O26.90  (Thrombocytosis)  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 Cord Insertion:      Previously visualized  Amniotic Fluid  AFI FV:      Within normal limits  AFI Sum(cm)     %Tile       Largest Pocket(cm)  9.9             16          5.28  RUQ(cm)                     LUQ(cm)  5.28                        4.62 ---------------------------------------------------------------------- Biometry (Fetus A)  BPD:      82.3  mm     G. Age:  33w 1d         66  %    CI:        76.19   %    70 - 86                                                          FL/HC:      19.2   %    19.1 - 21.3  HC:      298.8  mm     G. Age:  33w 1d         33  %    HC/AC:      1.11        0.96 - 1.17  AC:       269   mm     G. Age:  31w 0d         15  %    FL/BPD:     69.7   %    71 - 87  FL:       57.4  mm     G. Age:  30w 1d        2.4  %    FL/AC:      21.3   %    20 - 24  HUM:  50.6  mm     G. Age:  29w 5d        < 5  %  Est. FW:    1695  gm    3 lb 12 oz      11  %     FW Discordancy     0 \ 11 % ---------------------------------------------------------------------- OB History  Blood Type:   B+  Maternal Racial/Ethnic Group:   Black (non-Hispanic)  Gravidity:    2         Term:   1        Prem:   0        SAB:   0  TOP:          0       Ectopic:  0        Living: 1 ---------------------------------------------------------------------- Gestational Age (Fetus A)  LMP:           33w 1d        Date:  12/06/21                   EDD:   09/12/22  U/S Today:     31w 6d                                        EDD:   09/21/22  Best:          32w 2d     Det. By:  Previous Ultrasound      EDD:   09/18/22                                      (04/06/22)  ---------------------------------------------------------------------- Anatomy (Fetus A)  Cranium:               Appears normal         Aortic Arch:            Previously seen  Cavum:                 Previously seen        Ductal Arch:            Previously seen  Ventricles:            Previously seen        Diaphragm:              Appears normal  Choroid Plexus:        Previously seen        Stomach:                Appears normal, left                                                                        sided  Cerebellum:            Previously seen        Abdomen:                Appears normal  Posterior Fossa:  Previously seen        Abdominal Wall:         Appears nml (cord                                                                        insert, abd wall)  Nuchal Fold:           Previously seen        Cord Vessels:           Appears normal (3                                                                        vessel cord)  Face:                  Orbits and profile     Kidneys:                Appear normal                         previously seen  Lips:                  Previously seen        Bladder:                Appears normal  Thoracic:              Appears normal         Spine:                  Previously seen  Heart:                 Previously seen        Upper Extremities:      Previously seen  RVOT:                  Previously seen        Lower Extremities:      Previously seen  LVOT:                  Previously seen  Other:  IVC/SVC, 3VV, 3VTV previously visualized. Heels previously seen.          Hands and feet  previously visualized. Female gender. ---------------------------------------------------------------------- Doppler - Fetal Vessels (Fetus A)  Umbilical Artery   S/D     %tile      RI    %tile      PI    %tile     PSV    ADFV    RDFV                                                     (cm/s)   2.59  46    0.61       51    0.95       61    37.42      No      No  ---------------------------------------------------------------------- Fetal Evaluation (Fetus B)  Num Of Fetuses:         2  Fetal Heart Rate(bpm):  155  Cardiac Activity:       Observed  Presentation:           Breech, maternal left  Placenta:               Posterior  P. Cord Insertion:      Previously visualized  Membrane Desc:      Dividing Membrane seen - Dichorionic.  Amniotic Fluid  AFI FV:      Within normal limits                              Largest Pocket(cm)                              6.12 ---------------------------------------------------------------------- Biometry (Fetus B)  BPD:      77.2  mm     G. Age:  31w 0d         10  %    CI:        75.47   %    70 - 86                                                          FL/HC:      19.9   %    19.1 - 21.3  HC:      281.8  mm     G. Age:  30w 6d        1.6  %    HC/AC:      1.08        0.96 - 1.17  AC:      260.4  mm     G. Age:  30w 1d        4.7  %    FL/BPD:     72.7   %    71 - 87  FL:       56.1  mm     G. Age:  29w 4d        < 1  %    FL/AC:      21.5   %    20 - 24  HUM:        48  mm     G. Age:  28w 1d        < 5  %  LV:        7.4  mm  Est. FW:    1510  gm      3 lb 5 oz    2.5  %     FW Discordancy        11  % ---------------------------------------------------------------------- Gestational Age (Fetus B)  LMP:           33w 1d        Date:  12/06/21  EDD:   09/12/22  U/S Today:     30w 3d                                        EDD:   10/01/22  Best:          32w 2d     Det. By:  Previous Ultrasound      EDD:   09/18/22                                      (04/06/22) ---------------------------------------------------------------------- Anatomy (Fetus B)  Cranium:               Appears normal         Heart:                  Abnormal, see                                                                        comments  Cavum:                 Previously seen        Diaphragm:              Appears normal  Ventricles:             Previously seen        Stomach:                Appears normal, left                                                                        sided  Choroid Plexus:        Previously seen        Abdomen:                Appears normal  Cerebellum:            Previously seen        Abdominal Wall:         Previously seen  Posterior Fossa:       Previously seen        Cord Vessels:           Appears normal (3                                                                        vessel cord)  Nuchal Fold:           Previously seen  Kidneys:                Previously seen  Face:                  Appears normal         Bladder:                Appears normal                         (orbits and profile)  Lips:                  Previously seen        Spine:                  Previously seen  Palate:                Previously             Upper Extremities:      Previously seen                         visualized  Thoracic:              Previously seen        Lower Extremities:      Previously seen  Other:  Cardiac anomaly- AVSD. Hands and feet  previously visualized.          Heels previously seen. ---------------------------------------------------------------------- Doppler - Fetal Vessels (Fetus B)  Umbilical Artery                                                              ADFV                                                                Yes  Comment:    Intermittent absent end diastolic flow noted. ---------------------------------------------------------------------- Cervix Uterus Adnexa  Right Ovary  Not visualized.  Left Ovary  Not visualized. ---------------------------------------------------------------------- Comments  Follow-up ultrasound for di-di twins. Both twins are small but  only twin B is growth restricted and is likely the twin that is  high risk for T21. She has no concerns today.  Sonographic findings  Di-di intrauterine pregnancy at 32w 2d  Fetal cardiac activity: A: Observed, B: Observed.   Presentation: A: Cephalic, maternal right, B: Breech,  maternal left.  Twin A has an EIF; Twin B has an AV canal/complex  inlet/single ventricle  UTDA1, EIF, UTDA1 and absent nasal  bone.  Fetal biometry (estimated fetal weight): A: 11, B: 2.5  percentile.  Amniotic fluid volume: A: Within normal limits, B: Within  normal limits.  Placentas: A: Posterior, B: Posterior.  UA dopplers: A: normal, B: elevated (with two poor quality  dopplers that have intermittent absent)  BPP: A: 10/10, B: 8/10 (NST reassuring but not reactive)  Recommendations  I discussed the ultrasound findings and stillbirth risk and need  for close monitoring. She will monitor fetal movement and  come back  in two days for a repeat ultrasound.  -Follow up in two days for BPP with NST and UA dopplers. If  there is no absent flow she can resume weekly testing. If  there is absent flow steroids should be considered for fetal  lung maturity.  -Per peds cardiology: "After delivery, the baby should not  need to be maintained on prostaglandin E to maintain ductal  patency. A postnatal echo will need to be performed to  reassess all the cardiac anatomy, focusing on the size of the  septal defects and anatomy of the AV valve including chordal  attachments. After appropriate time to transition and  depending on how premature the baby is would most likely  be the major influences of her postnatal hospital stay. After  discharge, we would follow as an outpatient, with planning of  a complete repair usually between 4 and 6 months and when  the baby weighs around 5 kg." ----------------------------------------------------------------------                  Braxton Feathers, DO Electronically Signed Final Report   07/26/2022 04:25 pm ----------------------------------------------------------------------  [place] MFM OB FOLLOW UP ADDL GEST  Result Date: 07/26/2022 ----------------------------------------------------------------------  OBSTETRICS REPORT                        (Signed Final 07/26/2022 04:25 pm) ---------------------------------------------------------------------- Patient Info  ID #:       [id number]                          D.O.B.:  1986-03-24 (37 yrs)  Name:       Monica Green                       Visit Date: 07/26/2022 01:34 pm              [place] ---------------------------------------------------------------------- Performed By  Attending:        Braxton Feathers DO       Secondary Phy.:   Nigel Bridgeman                                                             CNM  Performed By:     Kris Hartmann,      Address:          [facility]                    RDMS                                                             Obstetrics &                                                             Gynecology  [address]                                                             [address]                                                             [place], [place]                                                             [address]  Referred By:      Mittie Bodo            [facility]  Ref. Address:     [facility] ---------------------------------------------------------------------- Orders  #  Description                           Code        Ordered By  1  [place] MFM FETAL BPP WO NON               52841.32    RAVI SHANKAR     STRESS  2  [place] MFM FETAL BPP WO NST               44010.2     RAVI SHANKAR     ADDL GESTATION  3  [place] MFM OB FOLLOW UP  [address].60    RAVI SHANKAR  4  [place] MFM OB FOLLOW UP ADDL              [id number]    RAVI SHANKAR     GEST  5  [place] MFM UA CORD DOPPLER                76820.02    RAVI  SHANKAR  6  [place] MFM UA ADDL GEST                   76820.01    RAVI SHANKAR ----------------------------------------------------------------------  #  Order #                     Accession #                Episode #  1  [id number]                   [id number]                 [id number]  2  [id number]                   [id number]                 [id number]  3  [id number]                   [id number]                 [id number]  4  [id number]                   [id number]                 [id number]  5  [id number]                   [id number]                 [id number]  6  [id number]                   [id number]                 [id number] ---------------------------------------------------------------------- Indications  [redacted] weeks gestation of pregnancy                Z3A.32  Maternal care for known or suspected poor      O36.5931  fetal growth, third trimester, fetus 1 IUGR  Maternal care for known or suspected poor      O36.5932  fetal growth, third trimester, fetus 2 IUGR  Twin pregnancy, di/di, third trimester         O30.043  Anemia during pregnancy in third trimester     O71.013  Twin B: UTDA1, hypoplastic nasal bone,         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, EIF; Twin A: EIF  Medical complication of pregnancy              O26.90  (Thrombocytosis)  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 Cord Insertion:      Previously visualized  Amniotic Fluid  AFI FV:      Within  normal limits  AFI Sum(cm)     %Tile       Largest Pocket(cm)  9.9             16          5.28  RUQ(cm)                     LUQ(cm)  5.28                        4.62 ---------------------------------------------------------------------- Biometry (Fetus A)  BPD:      82.3  mm     G. Age:  33w 1d         66  %    CI:        76.19   %    70 - 86                                                          FL/HC:      19.2   %    19.1 - 21.3  HC:      298.8  mm     G. Age:  33w 1d         33  %    HC/AC:      1.11        0.96 - 1.17  AC:       269   mm     G. Age:  31w 0d         15  %    FL/BPD:     69.7   %    71 - 87  FL:       57.4  mm     G. Age:  30w 1d        2.4  %    FL/AC:      21.3   %    20 - 24  HUM:      50.6  mm     G. Age:  29w 5d        < 5  %  Est. FW:    1695  gm    3 lb 12 oz      11  %     FW Discordancy     0 \ 11 % ---------------------------------------------------------------------- OB History  Blood Type:   B+  Maternal Racial/Ethnic Group:   Black (non-Hispanic)  Gravidity:    2         Term:   1        Prem:   0        SAB:   0  TOP:          0  Ectopic:  0        Living: 1 ---------------------------------------------------------------------- Gestational Age (Fetus A)  LMP:           33w 1d        Date:  12/06/21                   EDD:   09/12/22  U/S Today:     31w 6d                                        EDD:   09/21/22  Best:          32w 2d     Det. By:  Previous Ultrasound      EDD:   09/18/22                                      (04/06/22) ---------------------------------------------------------------------- Anatomy (Fetus A)  Cranium:               Appears normal         Aortic Arch:            Previously seen  Cavum:                 Previously seen        Ductal Arch:            Previously seen  Ventricles:            Previously seen        Diaphragm:              Appears normal  Choroid Plexus:        Previously seen        Stomach:                Appears normal, left                                                                         sided  Cerebellum:            Previously seen        Abdomen:                Appears normal  Posterior Fossa:       Previously seen        Abdominal Wall:         Appears nml (cord                                                                        insert, abd wall)  Nuchal Fold:           Previously seen        Cord Vessels:           Appears  normal (3                                                                        vessel cord)  Face:                  Orbits and profile     Kidneys:                Appear normal                         previously seen  Lips:                  Previously seen        Bladder:                Appears normal  Thoracic:              Appears normal         Spine:                  Previously seen  Heart:                 Previously seen        Upper Extremities:      Previously seen  RVOT:                  Previously seen        Lower Extremities:      Previously seen  LVOT:                  Previously seen  Other:  IVC/SVC, 3VV, 3VTV previously visualized. Heels previously seen.          Hands and feet  previously visualized. Female gender. ---------------------------------------------------------------------- Doppler - Fetal Vessels (Fetus A)  Umbilical Artery   S/D     %tile      RI    %tile      PI    %tile     PSV    ADFV    RDFV                                                     (cm/s)   2.59       46    0.61       51    0.95       61    37.42      No      No ---------------------------------------------------------------------- Fetal Evaluation (Fetus B)  Num Of Fetuses:         2  Fetal Heart Rate(bpm):  155  Cardiac Activity:       Observed  Presentation:           Breech, maternal left  Placenta:               Posterior  P. Cord Insertion:      Previously visualized  Membrane Desc:      Dividing Membrane seen - Dichorionic.  Amniotic Fluid  AFI FV:      Within normal limits                              Largest  Pocket(cm)                              6.12 ---------------------------------------------------------------------- Biometry (Fetus B)  BPD:      77.2  mm     G. Age:  31w 0d         10  %    CI:        75.47   %    70 - 86                                                          FL/HC:      19.9   %    19.1 - 21.3  HC:      281.8  mm     G. Age:  30w 6d        1.6  %    HC/AC:      1.08        0.96 - 1.17  AC:      260.4  mm     G. Age:  30w 1d        4.7  %    FL/BPD:     72.7   %    71 - 87  FL:       56.1  mm     G. Age:  29w 4d        < 1  %    FL/AC:      21.5   %    20 - 24  HUM:        48  mm     G. Age:  28w 1d        < 5  %  LV:        7.4  mm  Est. FW:    1510  gm      3 lb 5 oz    2.5  %     FW Discordancy        11  % ---------------------------------------------------------------------- Gestational Age (Fetus B)  LMP:           33w 1d        Date:  12/06/21                   EDD:   09/12/22  U/S Today:     30w 3d                                        EDD:   10/01/22  Best:          32w 2d     Det. By:  Previous Ultrasound      EDD:   09/18/22                                      (  04/06/22) ---------------------------------------------------------------------- Anatomy (Fetus B)  Cranium:               Appears normal         Heart:                  Abnormal, see                                                                        comments  Cavum:                 Previously seen        Diaphragm:              Appears normal  Ventricles:            Previously seen        Stomach:                Appears normal, left                                                                        sided  Choroid Plexus:        Previously seen        Abdomen:                Appears normal  Cerebellum:            Previously seen        Abdominal Wall:         Previously seen  Posterior Fossa:       Previously seen        Cord Vessels:           Appears normal (3                                                                         vessel cord)  Nuchal Fold:           Previously seen        Kidneys:                Previously seen  Face:                  Appears normal         Bladder:                Appears normal                         (orbits and profile)  Lips:                  Previously seen        Spine:  Previously seen  Palate:                Previously             Upper Extremities:      Previously seen                         visualized  Thoracic:              Previously seen        Lower Extremities:      Previously seen  Other:  Cardiac anomaly- AVSD. Hands and feet  previously visualized.          Heels previously seen. ---------------------------------------------------------------------- Doppler - Fetal Vessels (Fetus B)  Umbilical Artery                                                              ADFV                                                                Yes  Comment:    Intermittent absent end diastolic flow noted. ---------------------------------------------------------------------- Cervix Uterus Adnexa  Right Ovary  Not visualized.  Left Ovary  Not visualized. ---------------------------------------------------------------------- Comments  Follow-up ultrasound for di-di twins. Both twins are small but  only twin B is growth restricted and is likely the twin that is  high risk for T21. She has no concerns today.  Sonographic findings  Di-di intrauterine pregnancy at 32w 2d  Fetal cardiac activity: A: Observed, B: Observed.  Presentation: A: Cephalic, maternal right, B: Breech,  maternal left.  Twin A has an EIF; Twin B has an AV canal/complex  inlet/single ventricle  UTDA1, EIF, UTDA1 and absent nasal  bone.  Fetal biometry (estimated fetal weight): A: 11, B: 2.5  percentile.  Amniotic fluid volume: A: Within normal limits, B: Within  normal limits.  Placentas: A: Posterior, B: Posterior.  UA dopplers: A: normal, B: elevated (with two poor quality  dopplers that have intermittent absent)   BPP: A: 10/10, B: 8/10 (NST reassuring but not reactive)  Recommendations  I discussed the ultrasound findings and stillbirth risk and need  for close monitoring. She will monitor fetal movement and  come back in two days for a repeat ultrasound.  -Follow up in two days for BPP with NST and UA dopplers. If  there is no absent flow she can resume weekly testing. If  there is absent flow steroids should be considered for fetal  lung maturity.  -Per peds cardiology: "After delivery, the baby should not  need to be maintained on prostaglandin E to maintain ductal  patency. A postnatal echo will need to be performed to  reassess all the cardiac anatomy, focusing on the size of the  septal defects and anatomy of the AV valve including chordal  attachments. After appropriate time to transition and  depending on how premature the baby is would most likely  be the major influences of her postnatal hospital stay. After  discharge, we would follow as  an outpatient, with planning of  a complete repair usually between 4 and 6 months and when  the baby weighs around 5 kg." ----------------------------------------------------------------------                  Braxton Feathers, DO Electronically Signed Final Report   07/26/2022 04:25 pm ----------------------------------------------------------------------  [place] MFM UA CORD DOPPLER  Result Date: 07/26/2022 ----------------------------------------------------------------------  OBSTETRICS REPORT                       (Signed Final 07/26/2022 04:25 pm) ---------------------------------------------------------------------- Patient Info  ID #:       [id number]                          D.O.B.:  1985-10-24 (37 yrs)  Name:       Monica Green                       Visit Date: 07/26/2022 01:34 pm              [place] ---------------------------------------------------------------------- Performed By  Attending:        Braxton Feathers DO       Secondary Phy.:   Nigel Bridgeman                                                              CNM  Performed By:     Kris Hartmann,      Address:          [facility]                    RD[facility]                                                             Obstetrics &                                                             Gynecology                                                             [address]  [address]                                                             [place], [place]                                                             [address]  Referred By:      Mittie Bodo            Location[facility]         [facility]  Ref. Address:     [facility] ---------------------------------------------------------------------- Orders  #  Description                           Code        Ordered By  1  [place] MFM FETAL BPP WO NON               76819.01    RAVI SHANKAR     STRESS  2  [place] MFM FETAL BPP WO NST               60454.0     RAVI SHANKAR     ADDL GESTATION  3  [place] MFM OB FOLLOW UP                   76816.01    RAVI SHANKAR  4  [place] MFM OB FOLLOW UP ADDL              98119.14    RAVI SHANKAR     GEST  5  [place] MFM UA CORD DOPPLER                76820.02    RAVI SHANKAR  6  [place] MFM UA ADDL GEST                   76820.01    RAVI SHANKAR ----------------------------------------------------------------------  #  Order #  Accession #                Episode #  1  [id number]                   [id number]                 [id number]  2  [id number]                   [id number]                 [id number]  3  [id number]                   [id number]                 [id number]  4  [id number]                   [id number]                  [id number]  5  [id number]                   [id number]                 [id number]  6  [id number]                   [id number]                 [id number] ---------------------------------------------------------------------- Indications  [redacted] weeks gestation of pregnancy                Z3A.32  Maternal care for known or suspected poor      O36.5931  fetal growth, third trimester, fetus 1 IUGR  Maternal care for known or suspected poor      O36.5932  fetal growth, third trimester, fetus 2 IUGR  Twin pregnancy, di/di, third trimester         O30.043  Anemia during pregnancy in third trimester     O37.013  Twin B: UTDA1, hypoplastic nasal bone,         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, EIF; Twin A: EIF  Medical complication of pregnancy              O26.90  (Thrombocytosis)  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 Cord Insertion:      Previously visualized  Amniotic Fluid  AFI FV:      Within normal limits  AFI Sum(cm)     %Tile       Largest Pocket(cm)  9.9             16  5.28  RUQ(cm)                     LUQ(cm)  5.28                        4.62 ---------------------------------------------------------------------- Biometry (Fetus A)  BPD:      82.3  mm     G. Age:  33w 1d         66  %    CI:        76.19   %    70 - 86                                                          FL/HC:      19.2   %    19.1 - 21.3  HC:      298.8  mm     G. Age:  33w 1d          33  %    HC/AC:      1.11        0.96 - 1.17  AC:       269   mm     G. Age:  31w 0d         15  %    FL/BPD:     69.7   %    71 - 87  FL:       57.4  mm     G. Age:  30w 1d        2.4  %    FL/AC:      21.3   %    20 - 24  HUM:      50.6  mm     G. Age:  29w 5d        < 5  %  Est. FW:    1695  gm    3 lb 12 oz      11  %     FW Discordancy     0 \ 11 % ---------------------------------------------------------------------- OB History  Blood Type:   B+  Maternal Racial/Ethnic Group:   Black (non-Hispanic)  Gravidity:    2         Term:   1        Prem:   0        SAB:   0  TOP:          0       Ectopic:  0        Living: 1 ---------------------------------------------------------------------- Gestational Age (Fetus A)  LMP:           33w 1d        Date:  12/06/21                   EDD:   09/12/22  U/S Today:     31w 6d                                        EDD:   09/21/22  Best:          Armida Sans 2d  Det. By:  Previous Ultrasound      EDD:   09/18/22                                      (04/06/22) ---------------------------------------------------------------------- Anatomy (Fetus A)  Cranium:               Appears normal         Aortic Arch:            Previously seen  Cavum:                 Previously seen        Ductal Arch:            Previously seen  Ventricles:            Previously seen        Diaphragm:              Appears normal  Choroid Plexus:        Previously seen        Stomach:                Appears normal, left                                                                        sided  Cerebellum:            Previously seen        Abdomen:                Appears normal  Posterior Fossa:       Previously seen        Abdominal Wall:         Appears nml (cord                                                                        insert, abd wall)  Nuchal Fold:           Previously seen        Cord Vessels:           Appears normal (3                                                                         vessel cord)  Face:                  Orbits and profile     Kidneys:                Appear normal  previously seen  Lips:                  Previously seen        Bladder:                Appears normal  Thoracic:              Appears normal         Spine:                  Previously seen  Heart:                 Previously seen        Upper Extremities:      Previously seen  RVOT:                  Previously seen        Lower Extremities:      Previously seen  LVOT:                  Previously seen  Other:  IVC/SVC, 3VV, 3VTV previously visualized. Heels previously seen.          Hands and feet  previously visualized. Female gender. ---------------------------------------------------------------------- Doppler - Fetal Vessels (Fetus A)  Umbilical Artery   S/D     %tile      RI    %tile      PI    %tile     PSV    ADFV    RDFV                                                     (cm/s)   2.59       46    0.61       51    0.95       61    37.42      No      No ---------------------------------------------------------------------- Fetal Evaluation (Fetus B)  Num Of Fetuses:         2  Fetal Heart Rate(bpm):  155  Cardiac Activity:       Observed  Presentation:           Breech, maternal left  Placenta:               Posterior  P. Cord Insertion:      Previously visualized  Membrane Desc:      Dividing Membrane seen - Dichorionic.  Amniotic Fluid  AFI FV:      Within normal limits                              Largest Pocket(cm)                              6.12 ---------------------------------------------------------------------- Biometry (Fetus B)  BPD:      77.2  mm     G. Age:  31w 0d         10  %    CI:        75.47   %    70 - 86  FL/HC:      19.9   %    19.1 - 21.3  HC:      281.8  mm     G. Age:  30w 6d        1.6  %    HC/AC:      1.08        0.96 - 1.17  AC:      260.4  mm     G. Age:  30w 1d        4.7  %    FL/BPD:     72.7   %    71  - 87  FL:       56.1  mm     G. Age:  29w 4d        < 1  %    FL/AC:      21.5   %    20 - 24  HUM:        48  mm     G. Age:  28w 1d        < 5  %  LV:        7.4  mm  Est. FW:    1510  gm      3 lb 5 oz    2.5  %     FW Discordancy        11  % ---------------------------------------------------------------------- Gestational Age (Fetus B)  LMP:           33w 1d        Date:  12/06/21                   EDD:   09/12/22  U/S Today:     30w 3d                                        EDD:   10/01/22  Best:          32w 2d     Det. By:  Previous Ultrasound      EDD:   09/18/22                                      (04/06/22) ---------------------------------------------------------------------- Anatomy (Fetus B)  Cranium:               Appears normal         Heart:                  Abnormal, see                                                                        comments  Cavum:                 Previously seen        Diaphragm:              Appears normal  Ventricles:            Previously seen  Stomach:                Appears normal, left                                                                        sided  Choroid Plexus:        Previously seen        Abdomen:                Appears normal  Cerebellum:            Previously seen        Abdominal Wall:         Previously seen  Posterior Fossa:       Previously seen        Cord Vessels:           Appears normal (3                                                                        vessel cord)  Nuchal Fold:           Previously seen        Kidneys:                Previously seen  Face:                  Appears normal         Bladder:                Appears normal                         (orbits and profile)  Lips:                  Previously seen        Spine:                  Previously seen  Palate:                Previously             Upper Extremities:      Previously seen                         visualized  Thoracic:              Previously seen         Lower Extremities:      Previously seen  Other:  Cardiac anomaly- AVSD. Hands and feet  previously visualized.          Heels previously seen. ---------------------------------------------------------------------- Doppler - Fetal Vessels (Fetus B)  Umbilical Artery  ADFV                                                                Yes  Comment:    Intermittent absent end diastolic flow noted. ---------------------------------------------------------------------- Cervix Uterus Adnexa  Right Ovary  Not visualized.  Left Ovary  Not visualized. ---------------------------------------------------------------------- Comments  Follow-up ultrasound for di-di twins. Both twins are small but  only twin B is growth restricted and is likely the twin that is  high risk for T21. She has no concerns today.  Sonographic findings  Di-di intrauterine pregnancy at 32w 2d  Fetal cardiac activity: A: Observed, B: Observed.  Presentation: A: Cephalic, maternal right, B: Breech,  maternal left.  Twin A has an EIF; Twin B has an AV canal/complex  inlet/single ventricle  UTDA1, EIF, UTDA1 and absent nasal  bone.  Fetal biometry (estimated fetal weight): A: 11, B: 2.5  percentile.  Amniotic fluid volume: A: Within normal limits, B: Within  normal limits.  Placentas: A: Posterior, B: Posterior.  UA dopplers: A: normal, B: elevated (with two poor quality  dopplers that have intermittent absent)  BPP: A: 10/10, B: 8/10 (NST reassuring but not reactive)  Recommendations  I discussed the ultrasound findings and stillbirth risk and need  for close monitoring. She will monitor fetal movement and  come back in two days for a repeat ultrasound.  -Follow up in two days for BPP with NST and UA dopplers. If  there is no absent flow she can resume weekly testing. If  there is absent flow steroids should be considered for fetal  lung maturity.  -Per peds cardiology: "After delivery, the baby  should not  need to be maintained on prostaglandin E to maintain ductal  patency. A postnatal echo will need to be performed to  reassess all the cardiac anatomy, focusing on the size of the  septal defects and anatomy of the AV valve including chordal  attachments. After appropriate time to transition and  depending on how premature the baby is would most likely  be the major influences of her postnatal hospital stay. After  discharge, we would follow as an outpatient, with planning of  a complete repair usually between 4 and 6 months and when  the baby weighs around 5 kg." ----------------------------------------------------------------------                  Braxton Feathers, DO Electronically Signed Final Report   07/26/2022 04:25 pm ----------------------------------------------------------------------  [place] MFM UA ADDL GEST  Result Date: 07/26/2022 ----------------------------------------------------------------------  OBSTETRICS REPORT                       (Signed Final 07/26/2022 04:25 pm) ---------------------------------------------------------------------- Patient Info  ID #:       [id number]                          D.O.B.:  06/13/1985 (37 yrs)  Name:       Monica Green                       Visit Date: 07/26/2022 01:34 pm              Bruss ---------------------------------------------------------------------- Performed By  Attending:        Braxton Feathers DO       Secondary Phy.:   Nigel Bridgeman                                                             CNM  Performed By:     Kris Hartmann,      Address:          [facility]                                                             [facility]etrics &                                                             Gynecology                                                             [address]                                                             [address]                                                              [place], [place]                                                             [address]  Referred By:      Mittie Bodo            Location:         [facility]                                                             [facility]  Ref. Address:     [facility] ---------------------------------------------------------------------- Orders  #  Description                           Code        Ordered By  1  [place] MFM FETAL BPP WO NON               76819.01    RAVI SHANKAR     STRESS  2  [place] MFM FETAL BPP WO NST               76819.1     RAVI SHANKAR     ADDL GESTATION  3  [place] MFM OB FOLLOW UP                   76816.01    RAVI SHANKAR  4  [place] MFM OB FOLLOW UP ADDL              09811.91    RAVI SHANKAR     GEST  5  [place] MFM UA CORD DOPPLER                76820.02    RAVI SHANKAR  6  [place] MFM UA ADDL GEST                   76820.01    RAVI SHANKAR ----------------------------------------------------------------------  #  Order #                     Accession #                Episode #  1  [id number]                   [id number]                 [id number]  2  [id number]                   [id number]                 [id number]  3  [id number]                   [id number]                 [id number]  4  [id number]                   [id number]                 [id number]  5  [id number]                   [id number]  [id number]  6  [id number]                   [id number]                 [id number] ---------------------------------------------------------------------- Indications  [redacted] weeks gestation of pregnancy                Z3A.32  Maternal care for known or suspected poor      O36.5931  fetal growth, third trimester, fetus 1 IUGR  Maternal care for known or suspected poor      O36.5932  fetal growth, third trimester, fetus 2 IUGR  Twin  pregnancy, di/di, third trimester         O30.043  Anemia during pregnancy in third trimester     O18.013  Twin B: UTDA1, hypoplastic nasal bone,         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, EIF; Twin A: EIF  Medical complication of pregnancy              O26.90  (Thrombocytosis)  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 Cord Insertion:      Previously visualized  Amniotic Fluid  AFI FV:      Within normal limits  AFI Sum(cm)     %Tile       Largest Pocket(cm)  9.9             16          5.28  RUQ(cm)                     LUQ(cm)  5.28                        4.62 ---------------------------------------------------------------------- Biometry (Fetus A)  BPD:      82.3  mm     G. Age:  33w 1d         66  %    CI:        76.19   %    70 - 86                                                          FL/HC:      19.2   %    19.1 - 21.3  HC:      298.8  mm     G. Age:  33w 1d         33  %  HC/AC:      1.11        0.96 - 1.17  AC:       269   mm     G. Age:  31w 0d         15  %    FL/BPD:     69.7   %    71 - 87  FL:       57.4  mm     G. Age:  30w 1d        2.4  %    FL/AC:      21.3   %    20 - 24  HUM:      50.6  mm     G. Age:  29w 5d        < 5  %  Est. FW:    1695  gm    3 lb 12 oz      11  %     FW Discordancy     0 \ 11 % ---------------------------------------------------------------------- OB History  Blood Type:   B+  Maternal Racial/Ethnic Group:    Black (non-Hispanic)  Gravidity:    2         Term:   1        Prem:   0        SAB:   0  TOP:          0       Ectopic:  0        Living: 1 ---------------------------------------------------------------------- Gestational Age (Fetus A)  LMP:           33w 1d        Date:  12/06/21                   EDD:   09/12/22  U/S Today:     31w 6d                                        EDD:   09/21/22  Best:          32w 2d     Det. By:  Previous Ultrasound      EDD:   09/18/22                                      (04/06/22) ---------------------------------------------------------------------- Anatomy (Fetus A)  Cranium:               Appears normal         Aortic Arch:            Previously seen  Cavum:                 Previously seen        Ductal Arch:            Previously seen  Ventricles:            Previously seen        Diaphragm:              Appears normal  Choroid Plexus:        Previously seen        Stomach:  Appears normal, left                                                                        sided  Cerebellum:            Previously seen        Abdomen:                Appears normal  Posterior Fossa:       Previously seen        Abdominal Wall:         Appears nml (cord                                                                        insert, abd wall)  Nuchal Fold:           Previously seen        Cord Vessels:           Appears normal (3                                                                        vessel cord)  Face:                  Orbits and profile     Kidneys:                Appear normal                         previously seen  Lips:                  Previously seen        Bladder:                Appears normal  Thoracic:              Appears normal         Spine:                  Previously seen  Heart:                 Previously seen        Upper Extremities:      Previously seen  RVOT:                  Previously seen        Lower Extremities:      Previously seen   LVOT:                  Previously seen  Other:  IVC/SVC, 3VV, 3VTV previously visualized. Heels previously seen.  Hands and feet  previously visualized. Female gender. ---------------------------------------------------------------------- Doppler - Fetal Vessels (Fetus A)  Umbilical Artery   S/D     %tile      RI    %tile      PI    %tile     PSV    ADFV    RDFV                                                     (cm/s)   2.59       46    0.61       51    0.95       61    37.42      No      No ---------------------------------------------------------------------- Fetal Evaluation (Fetus B)  Num Of Fetuses:         2  Fetal Heart Rate(bpm):  155  Cardiac Activity:       Observed  Presentation:           Breech, maternal left  Placenta:               Posterior  P. Cord Insertion:      Previously visualized  Membrane Desc:      Dividing Membrane seen - Dichorionic.  Amniotic Fluid  AFI FV:      Within normal limits                              Largest Pocket(cm)                              6.12 ---------------------------------------------------------------------- Biometry (Fetus B)  BPD:      77.2  mm     G. Age:  31w 0d         10  %    CI:        75.47   %    70 - 86                                                          FL/HC:      19.9   %    19.1 - 21.3  HC:      281.8  mm     G. Age:  30w 6d        1.6  %    HC/AC:      1.08        0.96 - 1.17  AC:      260.4  mm     G. Age:  30w 1d        4.7  %    FL/BPD:     72.7   %    71 - 87  FL:       56.1  mm     G. Age:  29w 4d        < 1  %    FL/AC:      21.5   %    20 - 24  HUM:        48  mm     G. Age:  28w 1d        < 5  %  LV:        7.4  mm  Est. FW:    1510  gm      3 lb 5 oz    2.5  %     FW Discordancy        11  % ---------------------------------------------------------------------- Gestational Age (Fetus B)  LMP:           33w 1d        Date:  12/06/21                   EDD:   09/12/22  U/S Today:     30w 3d                                         EDD:   10/01/22  Best:          32w 2d     Det. By:  Previous Ultrasound      EDD:   09/18/22                                      (04/06/22) ---------------------------------------------------------------------- Anatomy (Fetus B)  Cranium:               Appears normal         Heart:                  Abnormal, see                                                                        comments  Cavum:                 Previously seen        Diaphragm:              Appears normal  Ventricles:            Previously seen        Stomach:                Appears normal, left                                                                        sided  Choroid Plexus:        Previously seen        Abdomen:                Appears normal  Cerebellum:            Previously seen        Abdominal Wall:  Previously seen  Posterior Fossa:       Previously seen        Cord Vessels:           Appears normal (3                                                                        vessel cord)  Nuchal Fold:           Previously seen        Kidneys:                Previously seen  Face:                  Appears normal         Bladder:                Appears normal                         (orbits and profile)  Lips:                  Previously seen        Spine:                  Previously seen  Palate:                Previously             Upper Extremities:      Previously seen                         visualized  Thoracic:              Previously seen        Lower Extremities:      Previously seen  Other:  Cardiac anomaly- AVSD. Hands and feet  previously visualized.          Heels previously seen. ---------------------------------------------------------------------- Doppler - Fetal Vessels (Fetus B)  Umbilical Artery                                                              ADFV                                                                Yes  Comment:    Intermittent absent end diastolic flow noted.  ---------------------------------------------------------------------- Cervix Uterus Adnexa  Right Ovary  Not visualized.  Left Ovary  Not visualized. ---------------------------------------------------------------------- Comments  Follow-up ultrasound for di-di twins. Both twins are small but  only twin B is growth restricted and is likely the twin that is  high risk for T21. She has no concerns today.  Sonographic findings  Di-di intrauterine pregnancy at  32w 2d  Fetal cardiac activity: A: Observed, B: Observed.  Presentation: A: Cephalic, maternal right, B: Breech,  maternal left.  Twin A has an EIF; Twin B has an AV canal/complex  inlet/single ventricle  UTDA1, EIF, UTDA1 and absent nasal  bone.  Fetal biometry (estimated fetal weight): A: 11, B: 2.5  percentile.  Amniotic fluid volume: A: Within normal limits, B: Within  normal limits.  Placentas: A: Posterior, B: Posterior.  UA dopplers: A: normal, B: elevated (with two poor quality  dopplers that have intermittent absent)  BPP: A: 10/10, B: 8/10 (NST reassuring but not reactive)  Recommendations  I discussed the ultrasound findings and stillbirth risk and need  for close monitoring. She will monitor fetal movement and  come back in two days for a repeat ultrasound.  -Follow up in two days for BPP with NST and UA dopplers. If  there is no absent flow she can resume weekly testing. If  there is absent flow steroids should be considered for fetal  lung maturity.  -Per peds cardiology: "After delivery, the baby should not  need to be maintained on prostaglandin E to maintain ductal  patency. A postnatal echo will need to be performed to  reassess all the cardiac anatomy, focusing on the size of the  septal defects and anatomy of the AV valve including chordal  attachments. After appropriate time to transition and  depending on how premature the baby is would most likely  be the major influences of her postnatal hospital stay. After  discharge, we would follow as  an outpatient, with planning of  a complete repair usually between 4 and 6 months and when  the baby weighs around 5 kg." ----------------------------------------------------------------------                  Braxton Feathers, DO Electronically Signed Final Report   07/26/2022 04:25 pm ----------------------------------------------------------------------  [place] MFM UA CORD DOPPLER  Result Date: 07/19/2022 ----------------------------------------------------------------------  OBSTETRICS REPORT                       (Signed Final 07/19/2022 03:32 pm) ---------------------------------------------------------------------- Patient Info  ID #:       [id number]                          D.O.B.:  May 04, 1985 (37 yrs)  Name:       Monica Green                       Visit Date: 07/19/2022 01:55 pm              Kozlowski ---------------------------------------------------------------------- Performed By  Attending:        Noralee Space MD        Secondary Phy.:   Nigel Bridgeman                                                             CNM  Performed By:     Marcellina Millin       Address:          [place] [facility]                    [facility]  Obstetrics &                                                             Gynecology                                                             [address]                                                             [address]                                                             [place], [place]                                                             [address]  Referred By:      Mittie Bodo            Location:         [facility]  Ref. Address:      CCOB ----------------------------------------------------------------------  Orders  #  Description                           Code        Ordered By  1  [place] MFM UA CORD DOPPLER                76820.02    RAVI SHANKAR  2  [place] MFM UA ADDL GEST                   76820.01    RAVI SHANKAR  3  [place] MFM OB LIMITED                     76815.01    RAVI [facility] ----------------------------------------------------------------------  #  Order #                     Accession #                Episode #  1  [id number]                   [id number]                 [id number]  2  [id number]                   [id number]                 [id number]  3  [id number]                   [id number]                 [id number] ---------------------------------------------------------------------- Indications  Maternal care for known or suspected poor      O36.5931  fetal growth, third trimester, fetus 1 IUGR  Maternal care for known or suspected poor      O36.5932  fetal growth, third trimester, fetus 2 IUGR  Twin pregnancy, di/di, third trimester         O30.043  Anemia during pregnancy in third trimester     O39.013  Twin B: UTDA1, hypoplastic nasal bone,         O35.8XX0  VSD, EIF; Twin A: EIF  Medical complication of pregnancy              O26.90  (Thrombocytosis)  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  management of mother in third trimester  (Twin B)  Abnormal biochemical screen (quad) for         O28.9  Trisomy 56  Advanced maternal age multigravida 34+,        O37.523  third trimester (37 yrs)  Poor obstetric history: Previous               O09.299  preeclampsia / eclampsia/gestational HTN  [redacted] weeks gestation of pregnancy                Z3A.31 ---------------------------------------------------------------------- Vital Signs  BP:          106/66 ---------------------------------------------------------------------- Fetal Evaluation (Fetus A)  Num Of Fetuses:         2  Fetal Heart Rate(bpm):  159  Cardiac Activity:       Observed   Fetal Lie:              Maternal right side  Presentation:           Cephalic  Placenta:               Posterior  P. Cord Insertion:      Previously visualized  Membrane Desc:      Dividing Membrane seen - Dichorionic.  Amniotic Fluid  AFI FV:      Within normal limits                              Largest Pocket(cm)                              4.34 ---------------------------------------------------------------------- OB History  Blood Type:   B+  Maternal Racial/Ethnic Group:   Black (non-Hispanic)  Gravidity:    2         Term:   1        Prem:   0        SAB:   0  TOP:          0       Ectopic:  0        Living: 1 ---------------------------------------------------------------------- Gestational Age (Fetus A)  LMP:           32w 1d        Date:  12/06/21                   EDD:   09/12/22  Best:          Bobbye Riggs 2d     Det. By:  Previous Ultrasound      EDD:   09/18/22                                      (04/06/22) ---------------------------------------------------------------------- Doppler - Fetal Vessels (Fetus A)  Umbilical Artery   S/D     %tile      RI    %tile      PI    %tile     PSV    ADFV    RDFV                                                     (cm/s)   2.91       60    0.66       67    1.02       70    49.81      No      No ---------------------------------------------------------------------- Fetal Evaluation (Fetus B)  Num Of Fetuses:         2  Fetal Heart Rate(bpm):  138  Cardiac Activity:       Observed  Fetal Lie:              Maternal left side  Presentation:           Breech  Placenta:               Anterior  P. Cord Insertion:      Marginal insertion prev seen  Membrane Desc:      Dividing Membrane seen - Dichorionic.  Amniotic Fluid  AFI FV:      Within normal limits  Largest Pocket(cm)                              4.97 ---------------------------------------------------------------------- Gestational Age (Fetus B)  LMP:           32w 1d        Date:  12/06/21                    EDD:   09/12/22  Best:          Bobbye Riggs 2d     Det. By:  Previous Ultrasound      EDD:   09/18/22                                      (04/06/22) ---------------------------------------------------------------------- Doppler - Fetal Vessels (Fetus B)  Umbilical Artery   S/D     %tile      RI    %tile      PI    %tile     PSV    ADFV    RDFV                                                     (cm/s)   4.43   > 97.5    0.77       97    1.41   > 97.5    42.12      No      No ---------------------------------------------------------------------- Impression  Dichorionic-diamniotic twin pregnancy with fetal growth  restrictions in both twins.  On cell-free fetal DNA screening, the risk for trisomy 21 was  increased.  On fetal echocardiography, atrioventricular septal defect was  seen in twin B.  Mild ventriculomegaly was noted on previous  ultrasound.  Cardiac anatomy in twin A was reported as  normal.  Twin A: Maternal right, cephalic presentation, posterior  placenta, female fetus.  Amniotic fluid is normal and good fetal  activity seen.  Umbilical artery Doppler showed normal  forward diastolic flow.  NST is reactive.  Twin B: Maternal left, breech presentation, anterior placenta,  female fetus.  Amniotic fluid is normal and good fetal activity  seen.  Umbilical artery Doppler showed increased S/D ratio.  NST is reactive.  Patient has a follow-up appointment with pediatric  cardiologist at [facility] and she will be counseled on  the place of delivery by them. ---------------------------------------------------------------------- Recommendations  Continue weekly antenatal testing till delivery . ----------------------------------------------------------------------                 Noralee Space, MD Electronically Signed Final Report   07/19/2022 03:32 pm ----------------------------------------------------------------------  [place] MFM UA ADDL GEST  Result Date:  07/19/2022 ----------------------------------------------------------------------  OBSTETRICS REPORT                       (Signed Final 07/19/2022 03:32 pm) ---------------------------------------------------------------------- Patient Info  ID #:       [id number]                          D.O.B.:  01/14/1986 (37 yrs)  Name:       Monica Green  Visit Date: 07/19/2022 01:55 pm              [place] ---------------------------------------------------------------------- Performed By  Attending:        Noralee Space MD        Secondary Phy.:   Nigel Bridgeman                                                             CNM  Performed By:     Marcellina Millin       Address:          [facility]                                                             Obstetrics &                                                             Gynecology                                                             [address]                                                             [address]                                                             [place], [place]                                                             [address]  Referred By:      Mittie Bodo  Location[facility]         [facility]  Ref. Address:     CCOB ---------------------------------------------------------------------- Orders  #  Description                           Code        Ordered By  1  [place] MFM UA CORD DOPPLER                76820.02    RAVI SHANKAR  2  [place] MFM UA ADDL GEST                   76820.01    RAVI SHANKAR  3  [place] MFM OB LIMITED                     76815.01    RAVI [facility]  ----------------------------------------------------------------------  #  Order #                     Accession #                Episode #  1  [id number]                   [id number]                 [id number]  2  [id number]                   [id number]                 [id number]  3  [id number]                   [id number]                 [id number] ---------------------------------------------------------------------- Indications  Maternal care for known or suspected poor      O36.5931  fetal growth, third trimester, fetus 1 IUGR  Maternal care for known or suspected poor      O36.5932  fetal growth, third trimester, fetus 2 IUGR  Twin pregnancy, di/di, third trimester         O30.043  Anemia during pregnancy in third trimester     O34.013  Twin B: UTDA1, hypoplastic nasal bone,         O35.8XX0  VSD, EIF;  Twin A: EIF  Medical complication of pregnancy              O26.90  (Thrombocytosis)  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  management of mother in third trimester  (Twin B)  Abnormal biochemical screen (quad) for         O28.9  Trisomy 98  Advanced maternal age multigravida 67+,        O57.523  third trimester (37 yrs)  Poor obstetric history: Previous               O09.299  preeclampsia / eclampsia/gestational HTN  [redacted] weeks gestation of pregnancy                Z3A.31 ---------------------------------------------------------------------- Vital Signs  BP:          106/66 ---------------------------------------------------------------------- Fetal Evaluation (Fetus A)  Num Of Fetuses:         2  Fetal Heart Rate(bpm):  159  Cardiac Activity:       Observed  Fetal Lie:              Maternal right side  Presentation:           Cephalic  Placenta:               Posterior  P. Cord Insertion:      Previously visualized  Membrane Desc:      Dividing Membrane seen - Dichorionic.  Amniotic Fluid  AFI FV:      Within normal limits                              Largest Pocket(cm)                               4.34 ---------------------------------------------------------------------- OB History  Blood Type:   B+  Maternal Racial/Ethnic Group:   Black (non-Hispanic)  Gravidity:    2         Term:   1        Prem:   0        SAB:   0  TOP:          0       Ectopic:  0        Living: 1 ---------------------------------------------------------------------- Gestational Age (Fetus A)  LMP:           32w 1d        Date:  12/06/21                   EDD:   09/12/22  Best:          Bobbye Riggs 2d     Det. By:  Previous Ultrasound      EDD:   09/18/22                                      (04/06/22) ---------------------------------------------------------------------- Doppler - Fetal Vessels (Fetus A)  Umbilical Artery   S/D     %tile      RI    %tile      PI    %tile     PSV    ADFV    RDFV                                                     (  cm/s)   2.91       60    0.66       67    1.02       70    49.81      No      No ---------------------------------------------------------------------- Fetal Evaluation (Fetus B)  Num Of Fetuses:         2  Fetal Heart Rate(bpm):  138  Cardiac Activity:       Observed  Fetal Lie:              Maternal left side  Presentation:           Breech  Placenta:               Anterior  P. Cord Insertion:      Marginal insertion prev seen  Membrane Desc:      Dividing Membrane seen - Dichorionic.  Amniotic Fluid  AFI FV:      Within normal limits                              Largest Pocket(cm)                              4.97 ---------------------------------------------------------------------- Gestational Age (Fetus B)  LMP:           32w 1d        Date:  12/06/21                   EDD:   09/12/22  Best:          Bobbye Riggs 2d     Det. By:  Previous Ultrasound      EDD:   09/18/22                                      (04/06/22) ---------------------------------------------------------------------- Doppler - Fetal Vessels (Fetus B)  Umbilical Artery   S/D     %tile      RI    %tile      PI    %tile     PSV    ADFV    RDFV                                                      (cm/s)   4.43   > 97.5    0.77       97    1.41   > 97.5    42.12      No      No ---------------------------------------------------------------------- Impression  Dichorionic-diamniotic twin pregnancy with fetal growth  restrictions in both twins.  On cell-free fetal DNA screening, the risk for trisomy 21 was  increased.  On fetal echocardiography, atrioventricular septal defect was  seen in twin B.  Mild ventriculomegaly was noted on previous  ultrasound.  Cardiac anatomy in twin A was reported as  normal.  Twin A: Maternal right, cephalic presentation, posterior  placenta, female fetus.  Amniotic fluid is normal and good fetal  activity seen.  Umbilical artery Doppler showed normal  forward diastolic flow.  NST is reactive.  Twin  B: Maternal left, breech presentation, anterior placenta,  female fetus.  Amniotic fluid is normal and good fetal activity  seen.  Umbilical artery Doppler showed increased S/D ratio.  NST is reactive.  Patient has a follow-up appointment with pediatric  cardiologist at [facility] and she will be counseled on  the place of delivery by them. ---------------------------------------------------------------------- Recommendations  Continue weekly antenatal testing till delivery . ----------------------------------------------------------------------                 Noralee Space, MD Electronically Signed Final Report   07/19/2022 03:32 pm ----------------------------------------------------------------------  [place] MFM OB LIMITED  Result Date: 07/19/2022 ----------------------------------------------------------------------  OBSTETRICS REPORT                       (Signed Final 07/19/2022 03:32 pm) ---------------------------------------------------------------------- Patient Info  ID #:       [id number]                          D.O.B.:  [birth year] (37 yrs)  Name:       LYNITA BUETOW                       Visit Date: 07/19/2022 01:55 pm               [place] ---------------------------------------------------------------------- Performed By  Attending:        Noralee Space MD        Secondary Phy.:   Nigel Bridgeman                                                             CNM  Performed By:     Marcellina Millin       Address:          [facility]                    [facility]                                                             Obstetrics &                                                             Gynecology                                                             [address]                                                             [address]                                                             [place], [place]                                                             [address]  Referred By:      Mittie Bodo            [facility]         [facility]  Ref. Address:     CCOB ---------------------------------------------------------------------- Orders  #  Description                           Code        Ordered By  1  [place] MFM UA CORD DOPPLER                [id number]    RAVI SHANKAR  2  [place] MFM UA ADDL GEST                   76820.01    RAVI SHANKAR  3  [place] MFM OB LIMITED                     60454.09    RAVI SHANKAR ----------------------------------------------------------------------  #  Order #                     Accession #                Episode #  1  [id number]  [id number]                 [id number]  2  [id number]                   [id number]                 [id number]  3  [id number]                   [id number]                 [id number] ---------------------------------------------------------------------- Indications  Maternal care for  known or suspected poor      O36.5931  fetal growth, third trimester, fetus 1 IUGR  Maternal care for known or suspected poor      O36.5932  fetal growth, third trimester, fetus 2 IUGR  Twin pregnancy, di/di, third trimester         O30.043  Anemia during pregnancy in third trimester     O70.013  Twin B: UTDA1, hypoplastic nasal bone,         O35.8XX0  VSD, EIF; Twin A: EIF  Medical complication of pregnancy              O26.90  (Thrombocytosis)  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  management of mother in third trimester  (Twin B)  Abnormal biochemical screen (quad) for         O28.9  Trisomy 108  Advanced maternal age multigravida 33+,        O29.523  third trimester (37 yrs)  Poor obstetric history: Previous               O09.299  preeclampsia / eclampsia/gestational HTN  [redacted] weeks gestation of pregnancy                Z3A.31 ---------------------------------------------------------------------- Vital Signs  BP:          106/66 ---------------------------------------------------------------------- Fetal Evaluation (Fetus A)  Num Of Fetuses:         2  Fetal Heart Rate(bpm):  159  Cardiac Activity:       Observed  Fetal Lie:              Maternal right side  Presentation:           Cephalic  Placenta:               Posterior  P. Cord Insertion:      Previously visualized  Membrane Desc:      Dividing Membrane seen - Dichorionic.  Amniotic Fluid  AFI FV:      Within normal limits                              Largest Pocket(cm)                              4.34 ---------------------------------------------------------------------- OB History  Blood Type:   B+  Maternal Racial/Ethnic Group:   Black (non-Hispanic)  Gravidity:    2         Term:   1        Prem:   0        SAB:   0  TOP:          0       Ectopic:  0        Living:  1 ---------------------------------------------------------------------- Gestational Age (Fetus A)  LMP:           32w 1d        Date:  12/06/21                   EDD:    09/12/22  Best:          Bobbye Riggs 2d     Det. By:  Previous Ultrasound      EDD:   09/18/22                                      (04/06/22) ---------------------------------------------------------------------- Doppler - Fetal Vessels (Fetus A)  Umbilical Artery   S/D     %tile      RI    %tile      PI    %tile     PSV    ADFV    RDFV                                                     (cm/s)   2.91       60    0.66       67    1.02       70    49.81      No      No ---------------------------------------------------------------------- Fetal Evaluation (Fetus B)  Num Of Fetuses:         2  Fetal Heart Rate(bpm):  138  Cardiac Activity:       Observed  Fetal Lie:              Maternal left side  Presentation:           Breech  Placenta:               Anterior  P. Cord Insertion:      Marginal insertion prev seen  Membrane Desc:      Dividing Membrane seen - Dichorionic.  Amniotic Fluid  AFI FV:      Within normal limits                              Largest Pocket(cm)                              4.97 ---------------------------------------------------------------------- Gestational Age (Fetus B)  LMP:           32w 1d        Date:  12/06/21                   EDD:   09/12/22  Best:          Bobbye Riggs 2d     Det. By:  Previous Ultrasound      EDD:   09/18/22                                      (04/06/22) ---------------------------------------------------------------------- Doppler - Fetal Vessels (Fetus B)  Umbilical Artery   S/D     %tile  RI    %tile      PI    %tile     PSV    ADFV    RDFV                                                     (cm/s)   4.43   > 97.5    0.77       97    1.41   > 97.5    42.12      No      No ---------------------------------------------------------------------- Impression  Dichorionic-diamniotic twin pregnancy with fetal growth  restrictions in both twins.  On cell-free fetal DNA screening, the risk for trisomy 21 was  increased.  On fetal echocardiography, atrioventricular septal defect was  seen  in twin B.  Mild ventriculomegaly was noted on previous  ultrasound.  Cardiac anatomy in twin A was reported as  normal.  Twin A: Maternal right, cephalic presentation, posterior  placenta, female fetus.  Amniotic fluid is normal and good fetal  activity seen.  Umbilical artery Doppler showed normal  forward diastolic flow.  NST is reactive.  Twin B: Maternal left, breech presentation, anterior placenta,  female fetus.  Amniotic fluid is normal and good fetal activity  seen.  Umbilical artery Doppler showed increased S/D ratio.  NST is reactive.  Patient has a follow-up appointment with pediatric  cardiologist at [facility] and she will be counseled on  the place of delivery by them. ---------------------------------------------------------------------- Recommendations  Continue weekly antenatal testing till delivery . ----------------------------------------------------------------------                 Noralee Space, MD Electronically Signed Final Report   07/19/2022 03:32 pm ----------------------------------------------------------------------   MAU Course: Orders Placed This Encounter  Procedures   Group B strep by PCR   Urine Culture   Urinalysis, Routine w reflex microscopic -Urine, Clean Catch   CBC   Diet NPO time specified   Notify physician (specify)   Vital signs   Defer vaginal exam for vaginal bleeding or PROM <37 weeks   Apply Antepartum Care Plan   Initiate Oral Care Protocol   Initiate Carrier Fluid Protocol   Continuous tocometry   Fetal monitoring   Activity as tolerated   Full code   Type and screen [facility]   Admit to Inpatient (patient's expected length of stay will be greater than 2 midnights or inpatient only procedure)   Admit to Inpatient (patient's expected length of stay will be greater than 2 midnights or inpatient only procedure)   Meds ordered this encounter  Medications   lactated ringers bolus 1,000 mL   DISCONTD: betamethasone  acetate-betamethasone sodium phosphate (CELESTONE) injection 12 mg   NIFEdipine (PROCARDIA) capsule 10 mg   DISCONTD: betamethasone acetate-betamethasone sodium phosphate (CELESTONE) injection 12 mg   betamethasone acetate-betamethasone sodium phosphate (CELESTONE) injection 12 mg   lactated ringers infusion   famotidine (PEPCID) IVPB 20 mg premix   calcium carbonate (TUMS - dosed in mg elemental calcium) chewable tablet 200 mg of elemental calcium   lactated ringers infusion   acetaminophen (TYLENOL) tablet 650 mg   DISCONTD: docusate sodium (COLACE) capsule 100 mg   calcium carbonate (TUMS - dosed in mg elemental calcium) chewable tablet 400 mg of elemental calcium   prenatal multivitamin tablet 1  tablet   zolpidem (AMBIEN) tablet 5 mg   docusate sodium (COLACE) capsule 100 mg    Assessment/Plan: JANYNE LEASON is a 37 y.o. female, G2P1001, IUP at 36 weeks with DiDi pregnancy, presenting for preterm contraction (S/P procardia 10mg  in MAU) and fetal decel noted in Twin B (which appears ot have resolved). H/O UC with increased BM over this past week, h/o Eclampsia ni previous pregnancy, was placed on keppra but does not take.  Pt endorse + Fm. Denies vaginal leakage. Denies vaginal bleeding.  I did not see or assess this pt, HP written off verbal PE with Dr Su Hilt.  Prenatal Problem: abnormal chromosomal and genetic finding on antenatal screening of mother (Elevated Trisomy 21 risk for this pregnancy.  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  Declined amnio. on twin B) advanced maternal age gravida Chronic  anemia (Hgb-9.1 at current NOB, Rx for Fe.  Prior pregnancy 6.6 at NOB, referred to Hematology., had IV Venofer infusions and  2 U PRBC.  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  Seeing peds cardiology at [facility] in 3rd trimester for recommendations for place  of delivery.  OK to deliver at [facility].) dichorionic diamniotic twin pregnancy Doppler studies abnormal (Twin B with intermittent absent end diastolic flow at 32 weeks, close monitoring.  Frequency of testing increased to 2x/week at 33 weeks.) fetal growth restriction history of pre-eclampsia (last pregnancy, then eclampsia PP. Seizure 10 days PP, placed on Keppra. 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) vitamin D deficiency (Noted at NOB, rx'd supplementation, recheck PP.)  FWB: Cat 1 for Both A& B right now Fetal Tracing.   Plan: Admit to Antepartum per consult with Dr Su Hilt Routine [facility] orders Pain med/epidural prn GBS, UC, GC/C CBC, T&S pending. NPO Ambien for sleep Tyelnol for discomfort LR 125 for fluids BMZ X2 Continuous monitoring Pepcid for Acid reflux.  Plan for PCS if fetal decel should reoccur or NRFHT.  Potential discharge tomorrow if reassuring strip and BMZ given.   [facility] CNM, FNP-C, PMHNP-BC  [id number]  [place], [place] [address]  Cell: [phone]  Office Phone: [phone] Fax: [phone] 08/07/2022  7:52 PM

## 2022-08-07 NOTE — Progress Notes (Addendum)
Patient ID: Monica Green, female   DOB: [birth year], 37 y.o.   MRN: [id number]  Monica Green is a 37 y.o. G2P1001 at [redacted]w[redacted]d admitted for Episodes of cat 2 tracing in Baby B.  Hospital Day No: 1  Subjective: Came to see pt d/t concern and tracing with LOC and ctxs.  Upon arrival pt is laying comfortably in bed and reports not feeling most of the ctxs.  Denies LOF or VB.  Upon observing pt pulse noted to be 122.  Upon further discussion, pt disclosed episode of diarrhea over the weekend for 3 days that has persisted.  She has ulcerative colitis and currently not on any medications.  Objective: BP 114/83 (BP Location: Left Arm)   Pulse (!) 112   Temp 98.3 F (36.8 C) (Oral)   Resp 16   Ht 5\' 5"  (1.651 m)   Wt 68.4 kg   LMP 12/06/2021   SpO2 100%   BMI 25.08 kg/m  No intake/output data recorded. Total I/O In: -  Out: 425 [Emesis/NG output:425]  Physical Exam:  Gen: alert Chest/Lungs: unlabored breathing Heart/Pulse: RRR  Abdomen: soft, gravid, nontenderUterine fundus: soft, nontender Skin & Color: warm and dry  Neurological: AOx3, DTRs 2+ EXT: negative Homan's b/l, edema neg  FHT: A: FHR: 150 bpm, variability: moderate,  accelerations:  Present,  decelerations:  Absent B:FHR 120, episodes of moderate variability, no decel now but has had one in the last 30-51min, acceleration present. UC:   irregular SVE:    1-2/50/-3 at 1800  Labs: Lab Results  Component Value Date   WBC 10.3 08/07/2022   HGB 10.4 (L) 08/07/2022   HCT 32.2 (L) 08/07/2022   MCV 79.1 (L) 08/07/2022   PLT 264 08/07/2022    Assessment and Plan: has Iron deficiency anemia; Ulcerative colitis (HCC); Thrombocytosis; Pre-eclampsia in third trimester; AMA (advanced maternal age) primigravida 18+, third trimester; IUGR (intrauterine growth restriction) affecting care of mother; Term newborn delivered vaginally, current hospitalization; SVD (8/24); Normal postpartum course; Eclampsia; Dichorionic  diamniotic twin pregnancy, antepartum; Abnormal maternal serum screening test; Fetal heart rate decelerations affecting management of mother; and Preterm contractions on their problem list.  Bolus LR - pt likely significantly behind and still has loose to watery stools.  Will order medication to help Will replete potassium Recheck labs in the morning Anemia - will order iron Fetal status overall cat 1 for both babies but episodes of cat 2 for baby B.  Will continue to monitor closely.  Hopefully baby B will improve once hydrated and potassium replete. Will also order a liter of D5LR at 125cc/hr to start after bolus of LR. Pt says her diarrhea usually improves once she is rehydrated. Purcell Nails 08/07/2022, 11:22 PM

## 2022-08-07 NOTE — Progress Notes (Signed)
Tracing reviewed remotely by me Contractions not being felt per report but at times ctxs are regular Procardia prn until 2nd dose of BMZ.  Plan to give 12hrs after first dose. Baby A cat 1 Baby B episodes of cat 2 and cat 1 Lots of LOC however, RN doing her best to keep continuous tracing Lengthy discussion with patient around 1830 regarding plan of care.  Recommendation for c/s if fetal indication.  Pt is agreeable.  BMZ course reviewed and many questions answered. I spoke with Dr. Algernon Huxley and he is aware of increased for Trisomy 21 in Baby B and AV canal defect.  Will continue to monitor closely.  Pt undersgtands is tracing continues to have periods of cat 2, the recommendation is delivery after 2nd dose of BMZ and before if indicated.  Ve by me upon admission 1-2/50/-3.  Fetal head palpable.  Pt likely moving into PTL.  I discussed tocolysis until steroids are on board with Dr. Darra Lis and he does not recommend Mg but thinks procardia prn is reasonable.

## 2022-08-08 ENCOUNTER — Encounter (HOSPITAL_COMMUNITY): Payer: Self-pay | Admitting: Obstetrics and Gynecology

## 2022-08-08 DIAGNOSIS — O368390 Maternal care for abnormalities of the fetal heart rate or rhythm, unspecified trimester, not applicable or unspecified: Secondary | ICD-10-CM | POA: Diagnosis not present

## 2022-08-08 DIAGNOSIS — O365932 Maternal care for other known or suspected poor fetal growth, third trimester, fetus 2: Secondary | ICD-10-CM

## 2022-08-08 DIAGNOSIS — Z3A34 34 weeks gestation of pregnancy: Secondary | ICD-10-CM

## 2022-08-08 DIAGNOSIS — O280 Abnormal hematological finding on antenatal screening of mother: Secondary | ICD-10-CM

## 2022-08-08 DIAGNOSIS — O30043 Twin pregnancy, dichorionic/diamniotic, third trimester: Secondary | ICD-10-CM | POA: Diagnosis not present

## 2022-08-08 LAB — CBC
HCT: 30.8 % — ABNORMAL LOW (ref 36.0–46.0)
Hemoglobin: 9.7 g/dL — ABNORMAL LOW (ref 12.0–15.0)
MCH: 25 pg — ABNORMAL LOW (ref 26.0–34.0)
MCHC: 31.5 g/dL (ref 30.0–36.0)
MCV: 79.4 fL — ABNORMAL LOW (ref 80.0–100.0)
Platelets: 276 10*3/uL (ref 150–400)
RBC: 3.88 MIL/uL (ref 3.87–5.11)
RDW: 22.5 % — ABNORMAL HIGH (ref 11.5–15.5)
WBC: 5.8 10*3/uL (ref 4.0–10.5)
nRBC: 0 % (ref 0.0–0.2)

## 2022-08-08 LAB — COMPREHENSIVE METABOLIC PANEL
ALT: 11 U/L (ref 0–44)
AST: 14 U/L — ABNORMAL LOW (ref 15–41)
Albumin: 2 g/dL — ABNORMAL LOW (ref 3.5–5.0)
Alkaline Phosphatase: 133 U/L — ABNORMAL HIGH (ref 38–126)
Anion gap: 11 (ref 5–15)
BUN: <5 mg/dL — ABNORMAL LOW (ref 6–20)
CO2: 14 mmol/L — ABNORMAL LOW (ref 22–32)
Calcium: 8.6 mg/dL — ABNORMAL LOW (ref 8.9–10.3)
Chloride: 108 mmol/L (ref 98–111)
Creatinine, Ser: 0.5 mg/dL (ref 0.44–1.00)
GFR, Estimated: >60 mL/min (ref >60)
Glucose, Bld: 93 mg/dL (ref 70–99)
Potassium: 3.1 mmol/L — ABNORMAL LOW (ref 3.5–5.1)
Sodium: 133 mmol/L — ABNORMAL LOW (ref 135–145)
Total Bilirubin: 0.5 mg/dL (ref 0.3–1.2)
Total Protein: 6.2 g/dL — ABNORMAL LOW (ref 6.5–8.1)

## 2022-08-08 MED ORDER — FERROUS SULFATE 325 (65 FE) MG PO TABS
325.0000 mg | ORAL_TABLET | Freq: Every day | ORAL | Status: DC
  Administered 2022-08-09: 325 mg via ORAL
  Filled 2022-08-08: qty 1

## 2022-08-08 MED ORDER — POTASSIUM CHLORIDE CRYS ER 20 MEQ PO TBCR
20.0000 meq | EXTENDED_RELEASE_TABLET | Freq: Two times a day (BID) | ORAL | Status: DC
  Administered 2022-08-08 – 2022-08-13 (×10): 20 meq via ORAL
  Filled 2022-08-08 (×11): qty 1

## 2022-08-08 MED ORDER — LACTATED RINGERS IV BOLUS
500.0000 mL | Freq: Once | INTRAVENOUS | Status: AC
  Administered 2022-08-08: 500 mL via INTRAVENOUS

## 2022-08-08 MED ORDER — VITAMIN D 25 MCG (1000 UNIT) PO TABS
4000.0000 [IU] | ORAL_TABLET | Freq: Every day | ORAL | Status: DC
  Administered 2022-08-08 – 2022-08-09 (×2): 4000 [IU] via ORAL
  Filled 2022-08-08 (×3): qty 4

## 2022-08-08 NOTE — Progress Notes (Signed)
Monica Green is a 37 y.o. G2P1001 at [redacted]w[redacted]d with Di-di twin gestation admitted for FHR decelerations of Twin B.  Subjective: Patient doing well overnight. Tracing contraction irregularly and patient not feeling. Patient reports good fetal movement of both twin. States her stools are still mildly loose, but less frequent and not diarrhea.  Objective: BP 103/65 (BP Location: Left Arm)   Pulse 94   Temp (!) 97.4 F (36.3 C) (Oral)   Resp 18   Ht 5\' 5"  (1.651 m)   Wt 68.4 kg   LMP 12/06/2021   SpO2 100%   BMI 25.08 kg/m  I/O last 3 completed shifts: In: -  Out: 425 [Emesis/NG output:425] No intake/output data recorded.  FHT: Twin A: FHR: 130 bpm, variability: moderate,  accelerations:  Present,  decelerations:  Absent Twin B: FHR: 110 bpm, variability: moderate,  accelerations:  Present,  decelerations:  occasional, overall reassuring UC:   irregular, every 7-10 minutes SVE:   deferred  Labs: Lab Results  Component Value Date   WBC 5.8 08/08/2022   HGB 9.7 (L) 08/08/2022   HCT 30.8 (L) 08/08/2022   MCV 79.4 (L) 08/08/2022   PLT 276 08/08/2022    Assessment / Plan: 37 y.o. 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   D/w Dr. Parke Poisson, MFM attending, at bedside.  Jackie Plum, MD 08/08/2022, 1:27 PM

## 2022-08-08 NOTE — Consult Note (Signed)
MFM Note  Monica Green is a 37 year old gravida 2 para 1 currently at 34 weeks and 1 day.  Her pregnancy has been complicated by a dichorionic, diamniotic twin gestation with IUGR of twin B.  An AV canal defect is also suspected in twin B.  Her cell free DNA test indicated a high risk for trisomy 82 (Down syndrome).  She declined an amniocentesis for definitive diagnosis.  She was sent to the MAU yesterday from the MFM office after a late deceleration was noted on her NST.  During her evaluation in the MAU, the patient was noted to have frequent contractions.  Twin B continued to have intermittent decelerations.  Due to concerns regarding the fetal status, the patient was admitted for continuous monitoring and given a course of antenatal corticosteroids.  Last night, the fetal heart rate tracings for both fetuses were reactive overall.  However, twin B was noted to have continued intermittent decelerations.  It was unclear if the decelerations were due to a loss of contact due to fetal movements or if they were true variable decelerations.  The patient reports that she has been dehydrated over the past few days as she has experienced diarrhea over this time.  She is being treated with IV fluids and her electrolytes are being replaced.  Her umbilical artery Doppler studies performed yesterday showed a normal S/D ratio of 1.8 in twin A and an elevated S/D ratio of 5.75 in twin B.  Absent end-diastolic flow had been noted in twin B earlier in her pregnancy.  However, there were no signs of absent or reversed end-diastolic flow noted in either fetus yesterday.  The most recent EFW was obtained on July 26, 2022 showed an EFW for twin A of 3 pounds 12 ounces (11th percentile) and 3 pounds 5 ounces for twin B (3rd percentile).  Due to concerns regarding the fetal status, the patient will remain on continuous monitoring until tomorrow.  Due to IUGR of twin B with elevated umbilical artery Doppler  studies, the goal for her pregnancy is delivery at 35 weeks (early next week).  Inpatient management with daily fetal testing is recommended until delivery.  Delivery prior to 35 weeks is recommended: Should twin B show repetitive decelerations (3 or more decelerations over a span of 3 hours)  Should twin B show absent end-diastolic flow on her repeat umbilical artery Doppler studies tomorrow Should there be any further concerns regarding the fetal heart rate tracing or fetal status  The patient understands that her babies will require a NICU admission following delivery.    Her babies should be tested after birth to determine if either of them has Down syndrome.  I will see the patient again tomorrow to determine if there are any changes in the plan.    The patient is happy and comfortable with the plan outlined above.  She stated that all of her questions were answered today.

## 2022-08-09 ENCOUNTER — Inpatient Hospital Stay (HOSPITAL_COMMUNITY): Payer: Medicaid Other

## 2022-08-09 ENCOUNTER — Encounter (HOSPITAL_COMMUNITY): Payer: Self-pay | Admitting: Obstetrics and Gynecology

## 2022-08-09 DIAGNOSIS — O365931 Maternal care for other known or suspected poor fetal growth, third trimester, fetus 1: Secondary | ICD-10-CM

## 2022-08-09 DIAGNOSIS — O35BXX1 Maternal care for other (suspected) fetal abnormality and damage, fetal cardiac anomalies, fetus 1: Secondary | ICD-10-CM | POA: Diagnosis not present

## 2022-08-09 DIAGNOSIS — O99013 Anemia complicating pregnancy, third trimester: Secondary | ICD-10-CM | POA: Diagnosis not present

## 2022-08-09 DIAGNOSIS — O35EXX2 Maternal care for other (suspected) fetal abnormality and damage, fetal genitourinary anomalies, fetus 2: Secondary | ICD-10-CM

## 2022-08-09 DIAGNOSIS — O35AXX2 Maternal care for other (suspected) fetal abnormality and damage, fetal facial anomalies, fetus 2: Secondary | ICD-10-CM | POA: Diagnosis not present

## 2022-08-09 DIAGNOSIS — O35BXX2 Maternal care for other (suspected) fetal abnormality and damage, fetal cardiac anomalies, fetus 2: Secondary | ICD-10-CM | POA: Diagnosis not present

## 2022-08-09 DIAGNOSIS — O30043 Twin pregnancy, dichorionic/diamniotic, third trimester: Secondary | ICD-10-CM

## 2022-08-09 DIAGNOSIS — O99113 Other diseases of the blood and blood-forming organs and certain disorders involving the immune mechanism complicating pregnancy, third trimester: Secondary | ICD-10-CM

## 2022-08-09 DIAGNOSIS — Z3A34 34 weeks gestation of pregnancy: Secondary | ICD-10-CM | POA: Diagnosis not present

## 2022-08-09 DIAGNOSIS — O365932 Maternal care for other known or suspected poor fetal growth, third trimester, fetus 2: Secondary | ICD-10-CM

## 2022-08-09 DIAGNOSIS — O368390 Maternal care for abnormalities of the fetal heart rate or rhythm, unspecified trimester, not applicable or unspecified: Secondary | ICD-10-CM | POA: Diagnosis not present

## 2022-08-09 DIAGNOSIS — D75839 Thrombocytosis, unspecified: Secondary | ICD-10-CM

## 2022-08-09 DIAGNOSIS — D649 Anemia, unspecified: Secondary | ICD-10-CM

## 2022-08-09 LAB — TYPE AND SCREEN: ABO/RH(D): B POS

## 2022-08-09 LAB — BPAM RBC
Blood Product Expiration Date: 202406052359
Unit Type and Rh: 7300

## 2022-08-09 LAB — PREPARE RBC (CROSSMATCH)

## 2022-08-09 LAB — URINE CULTURE

## 2022-08-09 MED ORDER — SIMETHICONE 80 MG PO CHEW: 80.0000 mg | CHEWABLE_TABLET | ORAL | Status: DC | PRN

## 2022-08-09 MED ORDER — SENNOSIDES-DOCUSATE SODIUM 8.6-50 MG PO TABS: 2.0000 | ORAL_TABLET | Freq: Every day | ORAL | Status: DC

## 2022-08-09 MED ORDER — LACTATED RINGERS IV BOLUS
1000.0000 mL | Freq: Once | INTRAVENOUS | Status: AC
  Administered 2022-08-09: 1000 mL via INTRAVENOUS

## 2022-08-09 MED ORDER — IBUPROFEN 600 MG PO TABS: 600.0000 mg | ORAL_TABLET | Freq: Four times a day (QID) | ORAL | Status: DC

## 2022-08-09 MED ORDER — CEFAZOLIN SODIUM-DEXTROSE 2-4 GM/100ML-% IV SOLN
2.0000 g | INTRAVENOUS | Status: DC
  Filled 2022-08-09: qty 100

## 2022-08-09 MED ORDER — SOD CITRATE-CITRIC ACID 500-334 MG/5ML PO SOLN: 30.0000 mL | ORAL | Status: DC

## 2022-08-09 MED ORDER — CYCLOBENZAPRINE HCL 10 MG PO TABS
5.0000 mg | ORAL_TABLET | Freq: Three times a day (TID) | ORAL | Status: DC | PRN
  Administered 2022-08-09 (×2): 5 mg via ORAL
  Filled 2022-08-09 (×2): qty 1

## 2022-08-09 MED ORDER — HYDROXYZINE HCL 50 MG PO TABS
25.0000 mg | ORAL_TABLET | Freq: Three times a day (TID) | ORAL | Status: DC | PRN
  Administered 2022-08-09: 25 mg via ORAL
  Filled 2022-08-09: qty 1

## 2022-08-09 MED ORDER — COCONUT OIL OIL: 1.0000 | TOPICAL_OIL | Status: DC | PRN

## 2022-08-09 MED ORDER — ONDANSETRON HCL 4 MG/2ML IJ SOLN: 4.0000 mg | INTRAMUSCULAR | Status: DC | PRN

## 2022-08-09 MED ORDER — SOD CITRATE-CITRIC ACID 500-334 MG/5ML PO SOLN
30.0000 mL | ORAL | Status: AC
  Administered 2022-08-10: 30 mL via ORAL
  Filled 2022-08-09: qty 30

## 2022-08-09 MED ORDER — ZOLPIDEM TARTRATE 5 MG PO TABS: 5.0000 mg | ORAL_TABLET | Freq: Every evening | ORAL | Status: DC | PRN

## 2022-08-09 MED ORDER — ACETAMINOPHEN 325 MG PO TABS: 650.0000 mg | ORAL_TABLET | ORAL | Status: DC | PRN

## 2022-08-09 MED ORDER — PRENATAL MULTIVITAMIN CH: 1.0000 | ORAL_TABLET | Freq: Every day | ORAL | Status: DC

## 2022-08-09 MED ORDER — ACETAMINOPHEN 500 MG PO TABS
1000.0000 mg | ORAL_TABLET | Freq: Four times a day (QID) | ORAL | Status: DC | PRN
  Administered 2022-08-09 – 2022-08-10 (×2): 1000 mg via ORAL
  Filled 2022-08-09 (×2): qty 2

## 2022-08-09 MED ORDER — BENZOCAINE-MENTHOL 20-0.5 % EX AERO: 1.0000 | INHALATION_SPRAY | CUTANEOUS | Status: DC | PRN

## 2022-08-09 MED ORDER — WITCH HAZEL-GLYCERIN EX PADS: 1.0000 | MEDICATED_PAD | CUTANEOUS | Status: DC | PRN

## 2022-08-09 MED ORDER — DIBUCAINE (PERIANAL) 1 % EX OINT: 1.0000 | TOPICAL_OINTMENT | CUTANEOUS | Status: DC | PRN

## 2022-08-09 MED ORDER — ONDANSETRON HCL 4 MG PO TABS: 4.0000 mg | ORAL_TABLET | ORAL | Status: DC | PRN

## 2022-08-09 MED ORDER — DIPHENHYDRAMINE HCL 25 MG PO CAPS: 25.0000 mg | ORAL_CAPSULE | Freq: Four times a day (QID) | ORAL | Status: DC | PRN

## 2022-08-09 MED ORDER — TETANUS-DIPHTH-ACELL PERTUSSIS 5-2.5-18.5 LF-MCG/0.5 IM SUSY: 0.5000 mL | PREFILLED_SYRINGE | Freq: Once | INTRAMUSCULAR | Status: DC

## 2022-08-09 MED ORDER — OXYCODONE HCL 5 MG PO TABS: 5.0000 mg | ORAL_TABLET | ORAL | Status: DC | PRN

## 2022-08-09 MED ORDER — LACTATED RINGERS IV BOLUS
250.0000 mL | Freq: Once | INTRAVENOUS | Status: AC
  Administered 2022-08-09: 250 mL via INTRAVENOUS

## 2022-08-09 MED ORDER — OXYCODONE HCL 5 MG PO TABS: 10.0000 mg | ORAL_TABLET | ORAL | Status: DC | PRN

## 2022-08-09 MED ORDER — SODIUM CHLORIDE 0.9 % IV SOLN
12.5000 mg | Freq: Once | INTRAVENOUS | Status: AC
  Administered 2022-08-09: 12.5 mg via INTRAVENOUS
  Filled 2022-08-09: qty 12.5

## 2022-08-09 MED ORDER — CEFAZOLIN SODIUM-DEXTROSE 2-4 GM/100ML-% IV SOLN
2.0000 g | INTRAVENOUS | Status: AC
  Administered 2022-08-10: 2 g via INTRAVENOUS
  Filled 2022-08-09: qty 100

## 2022-08-09 NOTE — Progress Notes (Signed)
Chaplain responded to a consult request for Advance Directive education. Chaplain introduced spiritual care to [facility] and her mother who is at her bedside. Jossette shared that she is familiar with these documents due to her experience at the end of her father's life. She is in a committed relationship, but not married and would still like her mother to serve as her health care agent, which is the protocol in the state of [place] when an individual does not have a guardian or a spouse and only has one living parent and no adult children.  Chaplain provided the Advance Directive packet as well as education on Advance Directives-documents an individual completes to communicate their health care directions in advance of a time when they may need them. Chaplain informed pt the documents which may be completed here in the hospital are the Living Will and Health Care Power of [place].   Chaplain informed that the Health Care Power of [place] is a legal document in which an individual names another person, their Health Care Agent, to make health care decisions when the individual is not able to make them for themselves. The Health Care Agent's function can be temporary or permanent depending on the pt's ability to make and communicate those decisions independently. Chaplain informed pt in the absence of a Health Care Power of [place], the state of [place] directs health care providers to look to the following individuals in the order listed: legal guardian; an attorney?in?fact under a general power of attorney (POA) if that POA includes the right to make health care decisions; a husband or wife; a majority of parents and adult children; a majority of adult brothers and sisters; or an individual who has an established relationship with you, who is acting in good faith and who can convey your wishes.  If none of these person are available or willing to make medical decisions on a patient's behalf, the law allows the  patient's doctor to make decisions for them as long as another doctor agrees with those decisions.  Chaplain also informed the patient that the Health Care agent has no decision-making authority over any affairs other than those related to his or her medical care.   The chaplain further educated the pt that a Living Will is a legal document that allows an individual to state his or her desire not to receive life-prolonging measures in the event that they have a condition that is incurable and will result in their death in a short period of time; they are unconscious, and doctors are confident that they will not regain consciousness; and/or they have advanced dementia or other substantial and irreversible loss of mental function. The chaplain informed pt that life-prolonging measures are medical treatments that would only serve to postpone death, including breathing machines, kidney dialysis, antibiotics, artificial nutrition and hydration (tube feeding), and similar forms of treatment and that if an individual is able to express their wishes, they may also make them known without the use of a Living Will, but in the event that an individual is not able to express their wishes themselves, a Living Will allows medical providers and the pt's family and friends ensure that they are not making decisions on the pt's behalf, but rather serving as the pt's voice to convey decisions the pt has already made.   The patient is aware that the decision to create an advance directive is theirs alone and they may chose not to complete the documents or may chose to complete  one portion or both.  The patient was informed that they can revoke the documents at any time by striking through them and writing void or by completing new documents, but that it is also advisable that the individual verbally notify interested parties that their wishes have changed.  They are also aware that the document must be signed in the presence of a  notary public and two witnesses and that this can be done while the patient is still admitted to the hospital or after discharge in the community. If they decide to complete Advance Directives after being discharged from the hospital, they have been advised to notify all interested parties and to provide those documents to their physicians and loved ones in addition to bringing them to the hospital in the event of another hospitalization.   The chaplain informed the pt that if they desire to proceed with completing Advance Directive Documentation while they are still admitted, notary services are typically available at [facility] between the hours of 1:00 and 3:30 Monday-Thursday.    When the patient is ready to have these documents completed, the patient should request that their nurse place a spiritual care consult and indicate that the patient is ready to have their advance directives notarized so that arrangements for witnesses and notary public can be made.  Please page spiritual care if the patient desires further education or has questions.

## 2022-08-09 NOTE — Consult Note (Signed)
Asked by Dr.Ogunbekun to provide prenatal consultation for this  37 y.o.  G2P1001 mother who is now [redacted]w[redacted]d with her pregnancy complicated by dichorionic opposite sex twins, both of whom have IUGR (boy about 11th %tile, girl.3rd %tile) with intermittent concerns for fetal well-being (intermittent FHR decels, elevated S/D in female twin). Also girl twin has cfDNA suggestive of Trisomy 21 and fetal echo with probable AV canal.  She was treated with betamethasone (second dose 5/7) and was being monitored continuously.  After I had met with the patient Dr. Normand Sloop called to let me know the decision had been made to deliver tomorrow.  I met with the patient one of the infants' grandmothers (not sure whether MGM or PGM) and discussed usual expectations for preterm infant at 59 - [redacted] weeks gestation, including possible needs for DR resuscitation, respiratory support, and IV access.  Also discussed problems related to IUGR (in both twins) and special needs for female twin due to probable AV canal and Trisomy 21. Explained family-centered care approach including parent participation in rounds, decision-making, and also presented usual criteria for discharge. Projected possible length of stay in NICU until [facility] for female twin, indeterminate LOS for female.  Discussed advantages of feeding with mother's milk and possible use of donor milk as "bridge" if needed until her supply is sufficient.  Patient and GM were attentive, had appropriate questions, and expressed appreciation for my input.  Thank you for consulting Neonatology.  Total time 45 minutes, face-to-face time 30 minutes  JWimmer, MD

## 2022-08-09 NOTE — Progress Notes (Signed)
Patients support person came to the desk requesting heat packs for patients back. Stating that the patient was having a lot of back pain and back spasms.   This NT went to check on patient with heat packs in hand, applied to lower back, obtained vitals which were within normal limits. Patient complained of all over joint pain as well that had began within the hour. More heat packs were applied to patient. RN made aware and came to bedside @ 1510

## 2022-08-09 NOTE — Progress Notes (Signed)
FHT Reviewed:  FHT Twin A: 145pm, moderate variability, + accel, decel FHT Twin B: 145bpm, moderate variability, + accel, - decel Toco: Occasional contractions noted  Steva Ready, DO

## 2022-08-09 NOTE — Consult Note (Signed)
MFM Note  Monica Green is currently at 34 weeks and 2 days with a dichorionic, diamniotic twin gestation with IUGR of twin B.    Overnight, twin B continued to have a few decelerations with spontaneous recovery of the fetal heart rate tracing.  Currently, the fetal heart rate tracings for both fetuses are reassuring.  The patient had a growth ultrasound today showing an EFW of 4 pounds 6 ounces (7th percentile) for twin A and 3 pounds 2 ounces (<1st percentile) for twin B.    There was normal amniotic fluid noted around both fetuses.    Doppler studies of the umbilical arteries performed today continues to show normal forward flow in both fetuses.  There were no signs of absent or reversed end-diastolic flow noted in either fetus.    Twin A is in the vertex presentation and twin B is breech.   As there has been minimal fetal growth of twin B noted over the past 3 weeks (indicating significant placental dysfunction) along with the recurrent spontaneous decelerations noted in twin B's fetal heart rate tracing, delivery is recommended.    The patient has already received a complete course of antenatal corticosteroids.  As the patient would like her family members to be present for her delivery, she will be scheduled for a cesarean delivery tomorrow morning.    The patient is happy and comfortable with the plan to proceed with delivery tomorrow.    She should be kept on continuous monitoring until delivery.

## 2022-08-09 NOTE — Progress Notes (Signed)
Monica Green is a 37 y.o. G2P1001 at [redacted]w[redacted]d with Di-di twin gestation admitted for FHR decelerations of Twin B.  Subjective: FHT tracing reviewed remotely. Significant FHR decel of twin B noted at 1812 and 2350, otherwise moderate variability and accelerations present. Periods of limited tracing due to maternal positioning  Objective: BP 118/81 (BP Location: Right Arm)   Pulse 99   Temp (!) 97.5 F (36.4 C) (Oral)   Resp 18   Ht 5\' 5"  (1.651 m)   Wt 68.4 kg   LMP 12/06/2021   SpO2 99%   BMI 25.08 kg/m  I/O last 3 completed shifts: In: 1708 [I.V.:1658; IV Piggyback:50] Out: 425 [Emesis/NG output:425] No intake/output data recorded.  FHT: Twin A: FHR: 130 bpm, variability: moderate,  accelerations:  Present,  decelerations:  Absent Twin B: FHR: 110 bpm, variability: moderate,  accelerations:  Present,  decelerations:  occasional UC:   irregular SVE:   deferred  Labs: Lab Results  Component Value Date   WBC 5.8 08/08/2022   HGB 9.7 (L) 08/08/2022   HCT 30.8 (L) 08/08/2022   MCV 79.4 (L) 08/08/2022   PLT 276 08/08/2022    Assessment / Plan: 37 y.o. 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    Jackie Plum, MD 08/09/2022, 12:51 AM

## 2022-08-09 NOTE — Progress Notes (Signed)
In to room to assess patient for reports of joint pain of the upper and lower extremities and low back spasms. Patient states this started earlier this afternoon and she has not experienced this before. States it hurts to stand and walk. She does not feel contractions. Reports history of ulcerative colitis and today she has had 4 bowel movements which is more than her normal. States she feels like her mouth is dry and sticky. She has tried to actively drink Gatorade. She feels like she needs IV fluids. She reports GERD but denies any chest pain or SOB. She is curious if her blood can be tested for any deficiencies. Earlier this evening, she was given two doses of Flexeril which did not help. She was given Vistaril PO 25mg  x 1 which helped her fall asleep but she has woken up with the same symptoms. She requests Tylenol. Denies leakage of fluid.  BP 118/83 (BP Location: Left Arm)   Pulse (!) 139 Comment: SpO2 monitor on  Temp 98.5 F (36.9 C) (Oral)   Resp 20   Ht 5\' 5"  (1.651 m)   Wt 68.4 kg   LMP 12/06/2021   SpO2 100%   BMI 25.08 kg/m  Gen:  Comfortable-appearing, appears sleepy Cardio: Tachycardic 120s Pulm:  Normal work of breathing Abd:  Soft, non-distended, non-tender throughout, no rebound/guarding Ext:  No bilateral LE edema, no bilateral calf tenderness  FHT Twin A: 165bpm, moderate variability, + accel, - decel FHT Twin B: 155bpm, moderate variability, + accel, occasional decel Toco: occasional      Latest Ref Rng & Units 08/08/2022    5:18 AM 08/07/2022   10:22 PM 07/24/2022   12:09 PM  CMP  Glucose 70 - 99 mg/dL 93  83  75   BUN 6 - 20 mg/dL <5  <5  5   Creatinine 0.44 - 1.00 mg/dL 1.61  0.96  0.45   Sodium 135 - 145 mmol/L 133  134  136   Potassium 3.5 - 5.1 mmol/L 3.1  2.4  3.3   Chloride 98 - 111 mmol/L 108  104  106   CO2 22 - 32 mmol/L 14  16  24    Calcium 8.9 - 10.3 mg/dL 8.6  8.7  8.7   Total Protein 6.5 - 8.1 g/dL 6.2  6.6  6.7   Total Bilirubin 0.3 - 1.2 mg/dL  0.5  0.7  0.2   Alkaline Phos 38 - 126 U/L 133  133  98   AST 15 - 41 U/L 14  16  12    ALT 0 - 44 U/L 11  13  7      A/P: G2P1001 at [redacted]w[redacted]d with DiDi Twin gestation with IUGR Twin B (possible T21) and occasional fetal heart rate decelerations of Twin B with reports of joint pain and low back muscle spasms.  Patient has a history of ulcerative colitis and reports having had 4 bowel movements today which is unusual for her. Patient was also noted to be hypokalemic on admission. Suspect symptoms are due to dehydration and/or electrolyte abnormality. Previous cervical exam at 1844 was 2/thick/posterior. Given new onset tachycardia (120s) and multiple bowel movements, will administer a 1L IVF bolus. Previously received at 250cc IVF bolus earlier this evening. Repeat CMP ordered to assess electrolytes and will replace as needed. Currently receiving PO potassium replacement. Tylenol 1000mg  q6h PRN ordered. Patient scheduled for primary C/S in the AM.  Steva Ready, DO

## 2022-08-09 NOTE — Progress Notes (Signed)
Pt without complaints.  No leakage of fluid or VB.  Good FM  BP 104/62 (BP Location: Right Arm)   Pulse 80   Temp 98.2 F (36.8 C) (Oral)   Resp 17   Ht 5\' 5"  (1.651 m)   Wt 68.4 kg   LMP 12/06/2021   SpO2 99%   BMI 25.08 kg/m   FHTS  cat 1 both twins.  Twin B with occ decel with good recovery   Toco irregular, every 30-45 minutes  Pt in NAD CV RRR Lungs CTAB abd  Gravid soft and NT GU no vb EXt no calf tenderness Results for orders placed or performed during the hospital encounter of 08/07/22 (from the past 72 hour(s))  Urinalysis, Routine w reflex microscopic -Urine, Clean Catch     Status: Abnormal   Collection Time: 08/07/22  3:59 PM  Result Value Ref Range   Color, Urine YELLOW YELLOW   APPearance CLEAR CLEAR   Specific Gravity, Urine 1.014 1.005 - 1.030   pH 6.0 5.0 - 8.0   Glucose, UA NEGATIVE NEGATIVE mg/dL   Hgb urine dipstick NEGATIVE NEGATIVE   Bilirubin Urine NEGATIVE NEGATIVE   Ketones, ur 80 (A) NEGATIVE mg/dL   Protein, ur 30 (A) NEGATIVE mg/dL   Nitrite NEGATIVE NEGATIVE   Leukocytes,Ua SMALL (A) NEGATIVE   RBC / HPF 0-5 0 - 5 RBC/hpf   WBC, UA 6-10 0 - 5 WBC/hpf   Bacteria, UA RARE (A) NONE SEEN   Squamous Epithelial / HPF 0-5 0 - 5 /HPF   Mucus PRESENT     Comment: Performed at [facility], [address] [place], [place] [address]  Urine Culture     Status: Abnormal (Preliminary result)   Collection Time: 08/07/22  3:59 PM   Specimen: Urine, Clean Catch  Result Value Ref Range   Specimen Description URINE, CLEAN CATCH    Special Requests NONE    Culture (A)     70,000 COLONIES/mL ESCHERICHIA COLI SUSCEPTIBILITIES TO FOLLOW Performed at [facility], [address] [place], [place] [address]    Report Status PENDING   Group B strep by PCR     Status: None   Collection Time: 08/07/22  5:47 PM   Specimen: Vaginal/Rectal; Genital  Result Value Ref Range   Group B strep by PCR NEGATIVE NEGATIVE    Comment: (NOTE) Intrapartum  testing with Xpert GBS assay should be used as an adjunct to other methods available and not used to replace antepartum testing (at 35-[redacted] weeks gestation). Performed at [facility], [address], [place], [place] [address]   Type and screen [facility]     Status: None   Collection Time: 08/07/22  6:50 PM  Result Value Ref Range   ABO/RH(D) B POS    Antibody Screen NEG    Sample Expiration      08/10/2022,2359 Performed at [facility], [address] [place], [place] [address]   Comprehensive metabolic panel     Status: Abnormal   Collection Time: 08/07/22 10:22 PM  Result Value Ref Range   Sodium 134 (L) 135 - 145 mmol/L   Potassium 2.4 (LL) 3.5 - 5.1 mmol/L    Comment: CRITICAL RESULT CALLED TO, READ BACK BY AND VERIFIED WITH Otho Bellows, RN. [phone] 08/07/22. LPAIT   Chloride 104 98 - 111 mmol/L   CO2 16 (L) 22 - 32 mmol/L   Glucose, Bld 83 70 - 99 mg/dL  Comment: Glucose reference range applies only to samples taken after fasting for at least 8 hours.   BUN <5 (L) 6 - 20 mg/dL   Creatinine, Ser 1.61 0.44 - 1.00 mg/dL   Calcium 8.7 (L) 8.9 - 10.3 mg/dL   Total Protein 6.6 6.5 - 8.1 g/dL   Albumin 2.2 (L) 3.5 - 5.0 g/dL   AST 16 15 - 41 U/L   ALT 13 0 - 44 U/L   Alkaline Phosphatase 133 (H) 38 - 126 U/L   Total Bilirubin 0.7 0.3 - 1.2 mg/dL   GFR, Estimated >09 >60 mL/min    Comment: (NOTE) Calculated using the CKD-EPI Creatinine Equation (2021)    Anion gap 14 5 - 15    Comment: Performed at [facility], [address] [place], [place] [address]  CBC     Status: Abnormal   Collection Time: 08/07/22 10:22 PM  Result Value Ref Range   WBC 10.3 4.0 - 10.5 K/uL   RBC 4.07 3.87 - 5.11 MIL/uL   Hemoglobin 10.4 (L) 12.0 - 15.0 g/dL   HCT 81.1 (L) 91.4 - 78.2 %   MCV 79.1 (L) 80.0 - 100.0 fL   MCH 25.6 (L) 26.0 - 34.0 pg   MCHC 32.3 30.0 - 36.0 g/dL   RDW 95.6 (H) 21.3 - 08.6 %   Platelets 264 150 - 400 K/uL   nRBC 0.0 0.0 - 0.2 %     Comment: Performed at [facility], [address], [place], [place] [address]  CBC     Status: Abnormal   Collection Time: 08/08/22  5:18 AM  Result Value Ref Range   WBC 5.8 4.0 - 10.5 K/uL   RBC 3.88 3.87 - 5.11 MIL/uL   Hemoglobin 9.7 (L) 12.0 - 15.0 g/dL   HCT 96.2 (L) 95.2 - 84.1 %   MCV 79.4 (L) 80.0 - 100.0 fL   MCH 25.0 (L) 26.0 - 34.0 pg   MCHC 31.5 30.0 - 36.0 g/dL   RDW 32.4 (H) 40.1 - 02.7 %   Platelets 276 150 - 400 K/uL   nRBC 0.0 0.0 - 0.2 %    Comment: Performed at [facility], [address], [place], [place] [address]  Comprehensive metabolic panel     Status: Abnormal   Collection Time: 08/08/22  5:18 AM  Result Value Ref Range   Sodium 133 (L) 135 - 145 mmol/L   Potassium 3.1 (L) 3.5 - 5.1 mmol/L    Comment: DELTA CHECK NOTED   Chloride 108 98 - 111 mmol/L   CO2 14 (L) 22 - 32 mmol/L   Glucose, Bld 93 70 - 99 mg/dL    Comment: Glucose reference range applies only to samples taken after fasting for at least 8 hours.   BUN <5 (L) 6 - 20 mg/dL   Creatinine, Ser 4.40 0.44 - 1.00 mg/dL   Calcium 8.6 (L) 8.9 - 10.3 mg/dL   Total Protein 6.2 (L) 6.5 - 8.1 g/dL   Albumin 2.0 (L) 3.5 - 5.0 g/dL   AST 14 (L) 15 - 41 U/L   ALT 11 0 - 44 U/L   Alkaline Phosphatase 133 (H) 38 - 126 U/L   Total Bilirubin 0.5 0.3 - 1.2 mg/dL   GFR, Estimated >34 >74 mL/min    Comment: (NOTE) Calculated using the CKD-EPI Creatinine Equation (2021)    Anion gap 11 5 - 15    Comment: Performed at [facility],  [address] [place], [place] [address]    Assessment and Plan [redacted]w[redacted]d  DI DI twins Twin B with IUGR and occ decel.  Possible trisomy 31 Plan for CS in the morning.  Twin B has had no growth.   Dopplers are normal.  BPP 8/10 Pt agrees with the plan will proceed with CS earlier if testing bercomes non reasuring

## 2022-08-09 NOTE — Anesthesia Preprocedure Evaluation (Signed)
Anesthesia Evaluation  Patient identified by MRN, date of birth, ID band Patient awake    Reviewed: Allergy & Precautions, NPO status , Patient's Chart, lab work & pertinent test results  Airway Mallampati: II  TM Distance: >3 FB Neck ROM: Full    Dental no notable dental hx. (+) Teeth Intact, Dental Advisory Given   Pulmonary neg pulmonary ROS   Pulmonary exam normal breath sounds clear to auscultation       Cardiovascular hypertension, Normal cardiovascular exam Rhythm:Regular Rate:Normal     Neuro/Psych Seizures - (Eclampsia 2022),   negative psych ROS   GI/Hepatic Neg liver ROS, PUD,,,UC   Endo/Other  negative endocrine ROS    Renal/GU negative Renal ROS  negative genitourinary   Musculoskeletal negative musculoskeletal ROS (+)    Abdominal   Peds  Hematology  (+) Blood dyscrasia, anemia Lab Results      Component                Value               Date                      WBC                      5.8                 08/08/2022                HGB                      9.7 (L)             08/08/2022                HCT                      30.8 (L)            08/08/2022                MCV                      79.4 (L)            08/08/2022                PLT                      276                 08/08/2022              Anesthesia Other Findings  G2P1001 mother who is now [redacted]w[redacted]d with her pregnancy complicated by dichorionic opposite sex twins, both of whom have IUGR (boy about 11th %tile, girl.3rd %tile) with intermittent concerns for fetal well-being (intermittent FHR decels, elevated S/D in female twin)  Reproductive/Obstetrics (+) Pregnancy                             Anesthesia Physical Anesthesia Plan  ASA: 3  Anesthesia Plan: Spinal   Post-op Pain Management:    Induction:   PONV Risk Score and Plan: Treatment may vary due to age or medical condition  Airway Management  Planned: Natural Airway  Additional Equipment:   Intra-op Plan:   Post-operative Plan:  Informed Consent: I have reviewed the patients History and Physical, chart, labs and discussed the procedure including the risks, benefits and alternatives for the proposed anesthesia with the patient or authorized representative who has indicated his/her understanding and acceptance.     Dental advisory given  Plan Discussed with: CRNA  Anesthesia Plan Comments:        Anesthesia Quick Evaluation

## 2022-08-09 NOTE — Progress Notes (Signed)
Reviewed FHT at 0600 and noted period of late decelerations for twin B from 0200-0400, not previously made aware. Noted moderate variability and accelerations throughout. FHT now reassuring for twin B: baseline 110, moderate variability, accelerations present, no decelerations currently. Plan to proceed with MFM [place] as scheduled this morning. Twin A category 1.  Dr. Reesa Chew

## 2022-08-10 ENCOUNTER — Encounter (HOSPITAL_COMMUNITY): Payer: Self-pay | Admitting: Obstetrics and Gynecology

## 2022-08-10 ENCOUNTER — Inpatient Hospital Stay (HOSPITAL_COMMUNITY): Payer: Medicaid Other | Admitting: Anesthesiology

## 2022-08-10 ENCOUNTER — Encounter: Payer: Self-pay | Admitting: Hematology

## 2022-08-10 ENCOUNTER — Other Ambulatory Visit: Payer: Self-pay

## 2022-08-10 ENCOUNTER — Telehealth: Payer: Self-pay

## 2022-08-10 ENCOUNTER — Encounter (HOSPITAL_COMMUNITY)
Admission: AD | Disposition: A | Discharge status: Home / Self Care | Payer: Self-pay | Source: Home / Self Care | Attending: Obstetrics and Gynecology

## 2022-08-10 DIAGNOSIS — Z3A34 34 weeks gestation of pregnancy: Secondary | ICD-10-CM

## 2022-08-10 DIAGNOSIS — O365932 Maternal care for other known or suspected poor fetal growth, third trimester, fetus 2: Secondary | ICD-10-CM

## 2022-08-10 DIAGNOSIS — O365930 Maternal care for other known or suspected poor fetal growth, third trimester, not applicable or unspecified: Secondary | ICD-10-CM

## 2022-08-10 DIAGNOSIS — O321XX2 Maternal care for breech presentation, fetus 2: Secondary | ICD-10-CM

## 2022-08-10 DIAGNOSIS — O30043 Twin pregnancy, dichorionic/diamniotic, third trimester: Secondary | ICD-10-CM

## 2022-08-10 DIAGNOSIS — Z98891 History of uterine scar from previous surgery: Secondary | ICD-10-CM

## 2022-08-10 DIAGNOSIS — O09523 Supervision of elderly multigravida, third trimester: Secondary | ICD-10-CM

## 2022-08-10 DIAGNOSIS — O4423 Partial placenta previa NOS or without hemorrhage, third trimester: Secondary | ICD-10-CM

## 2022-08-10 LAB — CBC
HCT: 29.6 % — ABNORMAL LOW (ref 36.0–46.0)
HCT: 29.8 % — ABNORMAL LOW (ref 36.0–46.0)
HCT: 29.4 % — ABNORMAL LOW (ref 36.0–46.0)
Hemoglobin: 9.7 g/dL — ABNORMAL LOW (ref 12.0–15.0)
Hemoglobin: 9.3 g/dL — ABNORMAL LOW (ref 12.0–15.0)
Hemoglobin: 8.9 g/dL — ABNORMAL LOW (ref 12.0–15.0)
MCH: 25.5 pg — ABNORMAL LOW (ref 26.0–34.0)
MCH: 25.4 pg — ABNORMAL LOW (ref 26.0–34.0)
MCH: 24.7 pg — ABNORMAL LOW (ref 26.0–34.0)
MCHC: 32.8 g/dL (ref 30.0–36.0)
MCHC: 31.2 g/dL (ref 30.0–36.0)
MCHC: 30.3 g/dL (ref 30.0–36.0)
MCV: 77.9 fL — ABNORMAL LOW (ref 80.0–100.0)
MCV: 81.4 fL (ref 80.0–100.0)
MCV: 81.4 fL (ref 80.0–100.0)
Platelets: 212 10*3/uL (ref 150–400)
Platelets: 178 10*3/uL (ref 150–400)
Platelets: 198 10*3/uL (ref 150–400)
RBC: 3.8 MIL/uL — ABNORMAL LOW (ref 3.87–5.11)
RBC: 3.66 MIL/uL — ABNORMAL LOW (ref 3.87–5.11)
RBC: 3.61 MIL/uL — ABNORMAL LOW (ref 3.87–5.11)
RDW: 23 % — ABNORMAL HIGH (ref 11.5–15.5)
RDW: 23.2 % — ABNORMAL HIGH (ref 11.5–15.5)
RDW: 22.8 % — ABNORMAL HIGH (ref 11.5–15.5)
WBC: 10.9 10*3/uL — ABNORMAL HIGH (ref 4.0–10.5)
WBC: 15 10*3/uL — ABNORMAL HIGH (ref 4.0–10.5)
WBC: 20 10*3/uL — ABNORMAL HIGH (ref 4.0–10.5)
nRBC: 0 % (ref 0.0–0.2)
nRBC: 0.1 % (ref 0.0–0.2)
nRBC: 0 % (ref 0.0–0.2)

## 2022-08-10 LAB — RESPIRATORY PANEL BY PCR

## 2022-08-10 LAB — COMPREHENSIVE METABOLIC PANEL
ALT: 10 U/L (ref 0–44)
AST: 15 U/L (ref 15–41)
Albumin: 1.8 g/dL — ABNORMAL LOW (ref 3.5–5.0)
Alkaline Phosphatase: 104 U/L (ref 38–126)
Anion gap: 11 (ref 5–15)
BUN: <5 mg/dL — ABNORMAL LOW (ref 6–20)
CO2: 17 mmol/L — ABNORMAL LOW (ref 22–32)
Calcium: 9 mg/dL (ref 8.9–10.3)
Chloride: 107 mmol/L (ref 98–111)
Creatinine, Ser: 0.61 mg/dL (ref 0.44–1.00)
GFR, Estimated: >60 mL/min (ref >60)
Glucose, Bld: 100 mg/dL — ABNORMAL HIGH (ref 70–99)
Potassium: 2.9 mmol/L — ABNORMAL LOW (ref 3.5–5.1)
Sodium: 135 mmol/L (ref 135–145)
Total Bilirubin: 0.4 mg/dL (ref 0.3–1.2)
Total Protein: 5.6 g/dL — ABNORMAL LOW (ref 6.5–8.1)

## 2022-08-10 LAB — MAGNESIUM
Magnesium: 1.3 mg/dL — ABNORMAL LOW (ref 1.7–2.4)
Magnesium: 2.3 mg/dL (ref 1.7–2.4)

## 2022-08-10 LAB — URINE CULTURE: Culture: 70000 — AB

## 2022-08-10 LAB — RPR: RPR Ser Ql: NONREACTIVE

## 2022-08-10 SURGERY — Surgical Case
Anesthesia: Spinal

## 2022-08-10 MED ORDER — COCONUT OIL OIL
1.0000 | TOPICAL_OIL | Status: DC | PRN
  Administered 2022-08-10: 1 via TOPICAL

## 2022-08-10 MED ORDER — FENTANYL CITRATE (PF) 100 MCG/2ML IJ SOLN
INTRAMUSCULAR | Status: DC | PRN
  Administered 2022-08-10: 15 ug via INTRATHECAL

## 2022-08-10 MED ORDER — DEXAMETHASONE SODIUM PHOSPHATE 10 MG/ML IJ SOLN
INTRAMUSCULAR | Status: DC | PRN
  Administered 2022-08-10: 8 mg via INTRAVENOUS

## 2022-08-10 MED ORDER — KETOROLAC TROMETHAMINE 30 MG/ML IJ SOLN
30.0000 mg | Freq: Four times a day (QID) | INTRAMUSCULAR | Status: AC
  Administered 2022-08-10 – 2022-08-11 (×3): 30 mg via INTRAVENOUS
  Filled 2022-08-10 (×4): qty 1

## 2022-08-10 MED ORDER — OXYTOCIN-SODIUM CHLORIDE 30-0.9 UT/500ML-% IV SOLN
INTRAVENOUS | Status: AC
  Filled 2022-08-10: qty 500

## 2022-08-10 MED ORDER — FENTANYL CITRATE (PF) 100 MCG/2ML IJ SOLN
INTRAMUSCULAR | Status: AC
  Filled 2022-08-10: qty 2

## 2022-08-10 MED ORDER — STERILE WATER FOR IRRIGATION IR SOLN
Status: DC | PRN
  Administered 2022-08-10: 1000 mL

## 2022-08-10 MED ORDER — SIMETHICONE 80 MG PO CHEW: 80.0000 mg | CHEWABLE_TABLET | ORAL | Status: DC | PRN

## 2022-08-10 MED ORDER — NALOXONE HCL 0.4 MG/ML IJ SOLN: 0.4000 mg | INTRAMUSCULAR | Status: DC | PRN

## 2022-08-10 MED ORDER — ONDANSETRON HCL 4 MG/2ML IJ SOLN
INTRAMUSCULAR | Status: AC
  Filled 2022-08-10: qty 2

## 2022-08-10 MED ORDER — DEXMEDETOMIDINE HCL IN NACL 80 MCG/20ML IV SOLN
INTRAVENOUS | Status: AC
  Filled 2022-08-10: qty 20

## 2022-08-10 MED ORDER — PHENYLEPHRINE HCL-NACL 20-0.9 MG/250ML-% IV SOLN
INTRAVENOUS | Status: AC
  Filled 2022-08-10: qty 250

## 2022-08-10 MED ORDER — DIPHENHYDRAMINE HCL 25 MG PO CAPS: 25.0000 mg | ORAL_CAPSULE | ORAL | Status: DC | PRN

## 2022-08-10 MED ORDER — ONDANSETRON HCL 4 MG/2ML IJ SOLN: 4.0000 mg | Freq: Three times a day (TID) | INTRAMUSCULAR | Status: DC | PRN

## 2022-08-10 MED ORDER — FENTANYL CITRATE (PF) 100 MCG/2ML IJ SOLN
INTRAMUSCULAR | Status: DC | PRN
  Administered 2022-08-10: 50 ug via INTRAVENOUS
  Administered 2022-08-10: 85 ug via INTRAVENOUS
  Administered 2022-08-10: 50 ug via INTRAVENOUS

## 2022-08-10 MED ORDER — DIBUCAINE (PERIANAL) 1 % EX OINT: 1.0000 | TOPICAL_OINTMENT | CUTANEOUS | Status: DC | PRN

## 2022-08-10 MED ORDER — KETOROLAC TROMETHAMINE 30 MG/ML IJ SOLN
30.0000 mg | Freq: Four times a day (QID) | INTRAMUSCULAR | Status: DC | PRN
Start: 2022-08-10 — End: 2022-08-10

## 2022-08-10 MED ORDER — DIPHENHYDRAMINE HCL 50 MG/ML IJ SOLN: 12.5000 mg | INTRAMUSCULAR | Status: DC | PRN

## 2022-08-10 MED ORDER — SODIUM CHLORIDE 0.9% FLUSH: 3.0000 mL | INTRAVENOUS | Status: DC | PRN

## 2022-08-10 MED ORDER — CHLOROPROCAINE HCL (PF) 3 % IJ SOLN
INTRAMUSCULAR | Status: AC
  Filled 2022-08-10: qty 20

## 2022-08-10 MED ORDER — MORPHINE SULFATE (PF) 0.5 MG/ML IJ SOLN
INTRAMUSCULAR | Status: AC
  Filled 2022-08-10: qty 10

## 2022-08-10 MED ORDER — SIMETHICONE 80 MG PO CHEW
80.0000 mg | CHEWABLE_TABLET | Freq: Three times a day (TID) | ORAL | Status: DC
  Administered 2022-08-10 – 2022-08-13 (×7): 80 mg via ORAL
  Filled 2022-08-10 (×8): qty 1

## 2022-08-10 MED ORDER — TRANEXAMIC ACID-NACL 1000-0.7 MG/100ML-% IV SOLN
INTRAVENOUS | Status: DC | PRN
  Administered 2022-08-10: 1000 mg via INTRAVENOUS

## 2022-08-10 MED ORDER — OXYCODONE HCL 5 MG PO TABS
5.0000 mg | ORAL_TABLET | ORAL | Status: DC | PRN
  Administered 2022-08-11 – 2022-08-13 (×8): 10 mg via ORAL
  Filled 2022-08-10 (×8): qty 2

## 2022-08-10 MED ORDER — BUPIVACAINE IN DEXTROSE 0.75-8.25 % IT SOLN
INTRATHECAL | Status: DC | PRN
  Administered 2022-08-10: 1.8 mL via INTRATHECAL

## 2022-08-10 MED ORDER — PHENYLEPHRINE 80 MCG/ML (10ML) SYRINGE FOR IV PUSH (FOR BLOOD PRESSURE SUPPORT)
PREFILLED_SYRINGE | INTRAVENOUS | Status: AC
  Filled 2022-08-10: qty 10

## 2022-08-10 MED ORDER — DEXAMETHASONE SODIUM PHOSPHATE 4 MG/ML IJ SOLN
INTRAMUSCULAR | Status: AC
  Filled 2022-08-10: qty 2

## 2022-08-10 MED ORDER — MORPHINE SULFATE (PF) 0.5 MG/ML IJ SOLN
INTRAMUSCULAR | Status: DC | PRN
  Administered 2022-08-10: 150 ug via INTRATHECAL

## 2022-08-10 MED ORDER — FENTANYL CITRATE (PF) 100 MCG/2ML IJ SOLN
25.0000 ug | INTRAMUSCULAR | Status: DC | PRN
  Administered 2022-08-10: 50 ug via INTRAVENOUS

## 2022-08-10 MED ORDER — LACTATED RINGERS IV BOLUS
500.0000 mL | Freq: Once | INTRAVENOUS | Status: AC
  Administered 2022-08-10: 500 mL via INTRAVENOUS

## 2022-08-10 MED ORDER — POTASSIUM CHLORIDE 10 MEQ/100ML IV SOLN
10.0000 meq | Freq: Once | INTRAVENOUS | Status: AC
  Administered 2022-08-10: 10 meq via INTRAVENOUS
  Filled 2022-08-10: qty 100

## 2022-08-10 MED ORDER — FENTANYL CITRATE (PF) 100 MCG/2ML IJ SOLN: INTRAMUSCULAR | Status: DC | PRN

## 2022-08-10 MED ORDER — MENTHOL 3 MG MT LOZG: 1.0000 | LOZENGE | OROMUCOSAL | Status: DC | PRN

## 2022-08-10 MED ORDER — PRENATAL MULTIVITAMIN CH
1.0000 | ORAL_TABLET | Freq: Every day | ORAL | Status: DC
  Administered 2022-08-10 – 2022-08-12 (×3): 1 via ORAL
  Filled 2022-08-10 (×3): qty 1

## 2022-08-10 MED ORDER — MAGNESIUM SULFATE 4 GM/100ML IV SOLN
4.0000 g | Freq: Once | INTRAVENOUS | Status: AC
  Administered 2022-08-10: 4 g via INTRAVENOUS
  Filled 2022-08-10: qty 100

## 2022-08-10 MED ORDER — SCOPOLAMINE 1 MG/3DAYS TD PT72
MEDICATED_PATCH | TRANSDERMAL | Status: DC | PRN
  Administered 2022-08-10: 1 via TRANSDERMAL

## 2022-08-10 MED ORDER — CHLOROPROCAINE HCL (PF) 3 % IJ SOLN
INTRAMUSCULAR | Status: DC | PRN
  Administered 2022-08-10: 20 mL

## 2022-08-10 MED ORDER — METHYLERGONOVINE MALEATE 0.2 MG/ML IJ SOLN
INTRAMUSCULAR | Status: DC | PRN
  Administered 2022-08-10: .2 mg via INTRAMUSCULAR

## 2022-08-10 MED ORDER — METOCLOPRAMIDE HCL 5 MG/ML IJ SOLN
INTRAMUSCULAR | Status: DC | PRN
  Administered 2022-08-10: 5 mg via INTRAVENOUS

## 2022-08-10 MED ORDER — DIPHENHYDRAMINE HCL 25 MG PO CAPS
25.0000 mg | ORAL_CAPSULE | Freq: Four times a day (QID) | ORAL | Status: DC | PRN
  Administered 2022-08-11: 25 mg via ORAL
  Filled 2022-08-10: qty 1

## 2022-08-10 MED ORDER — SODIUM CHLORIDE 0.9 % IR SOLN
Status: DC | PRN
  Administered 2022-08-10: 1

## 2022-08-10 MED ORDER — WITCH HAZEL-GLYCERIN EX PADS: 1.0000 | MEDICATED_PAD | CUTANEOUS | Status: DC | PRN

## 2022-08-10 MED ORDER — MAGNESIUM SULFATE 2 GM/50ML IV SOLN
2.0000 g | Freq: Once | INTRAVENOUS | Status: AC
  Administered 2022-08-10: 2 g via INTRAVENOUS
  Filled 2022-08-10: qty 50

## 2022-08-10 MED ORDER — ONDANSETRON HCL 4 MG/2ML IJ SOLN
INTRAMUSCULAR | Status: DC | PRN
  Administered 2022-08-10: 4 mg via INTRAVENOUS

## 2022-08-10 MED ORDER — TRANEXAMIC ACID-NACL 1000-0.7 MG/100ML-% IV SOLN: 1000.0000 mg | Freq: Once | INTRAVENOUS | Status: DC

## 2022-08-10 MED ORDER — DEXMEDETOMIDINE HCL IN NACL 80 MCG/20ML IV SOLN
INTRAVENOUS | Status: DC | PRN
  Administered 2022-08-10: 8 ug via INTRAVENOUS

## 2022-08-10 MED ORDER — METOCLOPRAMIDE HCL 5 MG/ML IJ SOLN
INTRAMUSCULAR | Status: AC
  Filled 2022-08-10: qty 2

## 2022-08-10 MED ORDER — OXYTOCIN-SODIUM CHLORIDE 30-0.9 UT/500ML-% IV SOLN: 2.5000 [IU]/h | INTRAVENOUS | Status: AC

## 2022-08-10 MED ORDER — ZOLPIDEM TARTRATE 5 MG PO TABS: 5.0000 mg | ORAL_TABLET | Freq: Every evening | ORAL | Status: DC | PRN

## 2022-08-10 MED ORDER — SCOPOLAMINE 1 MG/3DAYS TD PT72
MEDICATED_PATCH | TRANSDERMAL | Status: AC
  Filled 2022-08-10: qty 1

## 2022-08-10 MED ORDER — ACETAMINOPHEN 500 MG PO TABS
1000.0000 mg | ORAL_TABLET | Freq: Four times a day (QID) | ORAL | Status: DC
  Administered 2022-08-10 – 2022-08-12 (×8): 1000 mg via ORAL
  Filled 2022-08-10 (×7): qty 2

## 2022-08-10 MED ORDER — TRANEXAMIC ACID-NACL 1000-0.7 MG/100ML-% IV SOLN
INTRAVENOUS | Status: AC
  Filled 2022-08-10: qty 100

## 2022-08-10 MED ORDER — SCOPOLAMINE 1 MG/3DAYS TD PT72: 1.0000 | MEDICATED_PATCH | Freq: Once | TRANSDERMAL | Status: DC

## 2022-08-10 MED ORDER — SENNOSIDES-DOCUSATE SODIUM 8.6-50 MG PO TABS
2.0000 | ORAL_TABLET | Freq: Every day | ORAL | Status: DC
  Filled 2022-08-10 (×2): qty 2

## 2022-08-10 MED ORDER — PHENYLEPHRINE HCL-NACL 20-0.9 MG/250ML-% IV SOLN
INTRAVENOUS | Status: DC | PRN
  Administered 2022-08-10: 60 ug/min via INTRAVENOUS

## 2022-08-10 MED ORDER — ACETAMINOPHEN 500 MG PO TABS: 1000.0000 mg | ORAL_TABLET | Freq: Four times a day (QID) | ORAL | Status: DC

## 2022-08-10 MED ORDER — NALOXONE HCL 4 MG/10ML IJ SOLN: 1.0000 ug/kg/h | INTRAVENOUS | Status: DC | PRN

## 2022-08-10 MED ORDER — PHENYLEPHRINE HCL (PRESSORS) 10 MG/ML IV SOLN
INTRAVENOUS | Status: DC | PRN
  Administered 2022-08-10: 160 ug via INTRAVENOUS

## 2022-08-10 MED ORDER — IBUPROFEN 600 MG PO TABS
600.0000 mg | ORAL_TABLET | Freq: Four times a day (QID) | ORAL | Status: DC
  Administered 2022-08-11 – 2022-08-13 (×7): 600 mg via ORAL
  Filled 2022-08-10 (×8): qty 1

## 2022-08-10 MED ORDER — OXYTOCIN-SODIUM CHLORIDE 30-0.9 UT/500ML-% IV SOLN
INTRAVENOUS | Status: DC | PRN
  Administered 2022-08-10: 400 mL via INTRAVENOUS

## 2022-08-10 MED ORDER — CEFAZOLIN SODIUM-DEXTROSE 2-4 GM/100ML-% IV SOLN
INTRAVENOUS | Status: AC
  Filled 2022-08-10: qty 100

## 2022-08-10 MED ORDER — METHYLERGONOVINE MALEATE 0.2 MG/ML IJ SOLN
INTRAMUSCULAR | Status: AC
  Filled 2022-08-10: qty 1

## 2022-08-10 SURGICAL SUPPLY — 41 items
APL PRP STRL LF DISP 70% ISPRP (MISCELLANEOUS) ×2
APL SKNCLS STERI-STRIP NONHPOA (GAUZE/BANDAGES/DRESSINGS) ×1
BARRIER ADHS 3X4 INTERCEED (GAUZE/BANDAGES/DRESSINGS) IMPLANT
BENZOIN TINCTURE PRP APPL 2/3 (GAUZE/BANDAGES/DRESSINGS) ×1 IMPLANT
BRR ADH 4X3 ABS CNTRL BYND (GAUZE/BANDAGES/DRESSINGS) ×1
CHLORAPREP W/TINT 26 (MISCELLANEOUS) ×2 IMPLANT
CLAMP UMBILICAL CORD (MISCELLANEOUS) IMPLANT
CLOTH BEACON ORANGE TIMEOUT ST (SAFETY) ×1 IMPLANT
DRSG OPSITE POSTOP 4X10 (GAUZE/BANDAGES/DRESSINGS) ×1 IMPLANT
ELECT REM PT RETURN 9FT ADLT (ELECTROSURGICAL) ×1
ELECTRODE REM PT RTRN 9FT ADLT (ELECTROSURGICAL) ×1 IMPLANT
EVACUATOR SILICONE 100CC (DRAIN) IMPLANT
EXTRACTOR VACUUM KIWI (MISCELLANEOUS) ×1 IMPLANT
GAUZE SPONGE 4X4 12PLY STRL LF (GAUZE/BANDAGES/DRESSINGS) IMPLANT
GLOVE BIO SURGEON STRL SZ7 (GLOVE) ×1 IMPLANT
GLOVE BIOGEL PI IND STRL 7.0 (GLOVE) ×3 IMPLANT
GOWN STRL REUS W/TWL LRG LVL3 (GOWN DISPOSABLE) ×2 IMPLANT
KIT ABG SYR 3ML LUER SLIP (SYRINGE) IMPLANT
NDL HYPO 25X5/8 SAFETYGLIDE (NEEDLE) IMPLANT
NEEDLE HYPO 25X5/8 SAFETYGLIDE (NEEDLE) IMPLANT
NS IRRIG 1000ML POUR BTL (IV SOLUTION) ×1 IMPLANT
PACK C SECTION WH (CUSTOM PROCEDURE TRAY) ×1 IMPLANT
PAD ABD DERMACEA PRESS 5X9 (GAUZE/BANDAGES/DRESSINGS) IMPLANT
PAD OB MATERNITY 4.3X12.25 (PERSONAL CARE ITEMS) ×1 IMPLANT
RTRCTR C-SECT PINK 25CM LRG (MISCELLANEOUS) ×1 IMPLANT
STRIP CLOSURE SKIN 1/2X4 (GAUZE/BANDAGES/DRESSINGS) ×1 IMPLANT
SUT MNCRL 0 VIOLET CTX 36 (SUTURE) ×1 IMPLANT
SUT MON AB 4-0 PS1 27 (SUTURE) ×1 IMPLANT
SUT PLAIN 2 0 (SUTURE) ×1
SUT PLAIN ABS 2-0 CT1 27XMFL (SUTURE) ×1 IMPLANT
SUT VIC AB 0 CTX 36 (SUTURE) ×2
SUT VIC AB 0 CTX36XBRD ANBCTRL (SUTURE) ×2 IMPLANT
SUT VIC AB 2-0 CT1 27 (SUTURE) ×1
SUT VIC AB 2-0 CT1 TAPERPNT 27 (SUTURE) ×1 IMPLANT
SUT VIC AB 2-0 SH 27 (SUTURE)
SUT VIC AB 2-0 SH 27XBRD (SUTURE) IMPLANT
SUT VIC AB 3-0 CT1 27 (SUTURE) ×1
SUT VIC AB 3-0 CT1 TAPERPNT 27 (SUTURE) ×1 IMPLANT
TOWEL OR 17X24 6PK STRL BLUE (TOWEL DISPOSABLE) ×1 IMPLANT
TRAY FOLEY W/BAG SLVR 14FR LF (SET/KITS/TRAYS/PACK) ×1 IMPLANT
WATER STERILE IRR 1000ML POUR (IV SOLUTION) ×1 IMPLANT

## 2022-08-10 NOTE — Transfer of Care (Signed)
Immediate Anesthesia Transfer of Care Note  Patient: Monica Green  Procedure(s) Performed: CESAREAN SECTION MULTI-GESTATIONAL  Patient Location: PACU  Anesthesia Type:Spinal  Level of Consciousness: awake, alert , and oriented  Airway & Oxygen Therapy: Patient Spontanous Breathing  Post-op Assessment: Report given to RN and Post -op Vital signs reviewed and stable  Post vital signs: Reviewed and stable  Last Vitals:  Vitals Value Taken Time  BP 120/77 08/10/22 1045  Temp 37.1 C 08/10/22 1040  Pulse 102 08/10/22 1052  Resp 32 08/10/22 1053  SpO2 100 % 08/10/22 1052  Vitals shown include unvalidated device data.  Last Pain:  Vitals:   08/10/22 1040  TempSrc:   PainSc: 7       Patients Stated Pain Goal: 5 (08/10/22 1040)  Complications: No notable events documented.

## 2022-08-10 NOTE — Progress Notes (Signed)
FHT Reviewed:  FHT Twin A: 155bpm, moderate variability, + accel, - decel (Category I) FHT Twin B: 155bpm, moderate variability, + accel, occasional decelerations previously (Category II) Toco: None     Latest Ref Rng & Units 08/10/2022    5:12 AM 08/08/2022    5:18 AM 08/07/2022   10:22 PM  CBC  WBC 4.0 - 10.5 K/uL 10.9  5.8  10.3   Hemoglobin 12.0 - 15.0 g/dL 9.7  9.7  82.9   Hematocrit 36.0 - 46.0 % 29.6  30.8  32.2   Platelets 150 - 400 K/uL 212  276  264       Latest Ref Rng & Units 08/10/2022   12:15 AM 08/08/2022    5:18 AM 08/07/2022   10:22 PM  CMP  Glucose 70 - 99 mg/dL 562  93  83   BUN 6 - 20 mg/dL <5  <5  <5   Creatinine 0.44 - 1.00 mg/dL 1.30  8.65  7.84   Sodium 135 - 145 mmol/L 135  133  134   Potassium 3.5 - 5.1 mmol/L 2.9  3.1  2.4   Chloride 98 - 111 mmol/L 107  108  104   CO2 22 - 32 mmol/L 17  14  16    Calcium 8.9 - 10.3 mg/dL 9.0  8.6  8.7   Total Protein 6.5 - 8.1 g/dL 5.6  6.2  6.6   Total Bilirubin 0.3 - 1.2 mg/dL 0.4  0.5  0.7   Alkaline Phos 38 - 126 U/L 104  133  133   AST 15 - 41 U/L 15  14  16    ALT 0 - 44 U/L 10  11  13     Component Ref Range & Units 00:15 (08/10/22) 1 yr ago (12/05/20) 1 yr ago (12/04/20) 1 yr ago (11/05/20) 7 yr ago (03/25/15) 7 yr ago (03/21/15)  Magnesium 1.7 - 2.4 mg/dL 1.3 Low  8.4 High Panic  CM 3.9 High  CM 1.9 CM 1.7 1.9   Labs reviewed. Will administer IV Potassium prior to C/S. Will replete Magnesium postpartum.  Steva Ready, DO

## 2022-08-10 NOTE — Progress Notes (Signed)
Called [facility] RN for pt at [phone]

## 2022-08-10 NOTE — Anesthesia Postprocedure Evaluation (Signed)
Anesthesia Post Note  Patient: Monica Green  Procedure(s) Performed: CESAREAN SECTION MULTI-GESTATIONAL     Patient location during evaluation: PACU Anesthesia Type: Spinal Level of consciousness: awake and alert Pain management: pain level controlled Vital Signs Assessment: post-procedure vital signs reviewed and stable Respiratory status: spontaneous breathing, nonlabored ventilation, respiratory function stable and patient connected to nasal cannula oxygen Cardiovascular status: blood pressure returned to baseline and stable Postop Assessment: no apparent nausea or vomiting, no headache, no backache and spinal receding Anesthetic complications: no  No notable events documented.  Last Vitals:  Vitals:   08/10/22 1130 08/10/22 1156  BP: 112/77 115/75  Pulse: (!) 107 (!) 111  Resp: (!) 24 18  Temp:  37.1 C  SpO2:  97%    Last Pain:  Vitals:   08/10/22 1156  TempSrc: Oral  PainSc:    Pain Goal: Patients Stated Pain Goal: 5 (08/10/22 1045)                 Antuane Eastridge L Moustapha Tooker

## 2022-08-10 NOTE — Lactation Note (Signed)
This note was copied from a baby's chart.  NICU Lactation Consultation Note  Patient Name: Monica Green [id number] Date: 08/10/2022 Age:38 hours  Reason for consult: Initial assessment; NICU baby; Multiple gestation; Infant < 6lbs; Late-preterm 34-36.6wks; Other (Comment) (AMA, IUGR)  SUBJECTIVE Visited with family of 6 hours old LPI NICU female; Ms. Pique is a P2 and but not very experienced breastfeeding, she reported that breastfeeding didn't work out with her first baby but she would like to try with the twins again and see if it works. Set her up with a DEBP, she started pumping during [facility] consult and already getting small amounts of colostrum, praised her for her efforts. Reviewed pumping schedule, lactogenesis II, benefits of premature milk and anticipatory guidelines.   OBJECTIVE Infant data: Mother's Current Feeding Choice: Breast Milk and Donor Milk  Infant feeding assessment Scale for Readiness: 3   Maternal data: G2P1103  C-Section, Low Transverse Has patient been taught Hand Expression?: Yes Hand Expression Comments: colostrum noted Significant Breast History:: (+) breast change during the pregnancy Current breast feeding challenges:: NICU admission Previous breastfeeding challenges?: Low milk supply Does the patient have breastfeeding experience prior to this delivery?: Yes How long did the patient breastfeed?: 2 weeks Pumping frequency: initiated pumping at 6 hours post-partum Pumped volume: 5 mL Flange Size: 21 Risk factor for low milk supply:: prematurity, infant separation, 1514 cc blood loss, AMA  WIC Program: Yes WIC Referral Sent?: Yes What county?: [place]  ASSESSMENT Infant: Feeding Status: Scheduled 8-11-2-5  Maternal: Milk volume: Normal  INTERVENTIONS/PLAN Interventions: Interventions: Breast feeding basics reviewed; DEBP; Education; [employer] brochure Tools: Pump; Flanges; Coconut oil Pump Education: Setup, frequency, and  cleaning; Milk Storage  Plan: Encouraged pumping every 3 hours, ideally 8 pumping sessions/24 hours or sooner if feeding cues are present Breast massage, hand expression and coconut oil were also encouraged prior pumping  MGM present and very supportive, 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 All questions and concerns answered, family to contact [facility] services PRN.  Consult Status: NICU follow-up NICU Follow-up type: New admission follow up   Amali Uhls S Philis Nettle 08/10/2022, 4:04 PM

## 2022-08-10 NOTE — Telephone Encounter (Signed)
Left pt a message per Dr Candise Che to : let patient know -- B12 -- 147 -- will need to start B12 po daily in addition to her prenatal vitamins.   Iron labs --ferritin 14 with Iron saturation of 8%  - take Iron polysaccharide 150mg  po daily , prescription has been sent in.

## 2022-08-10 NOTE — Op Note (Signed)
Cesarean Section Procedure Note  Indications: malpresentation: frank breech presentation of twin B  Pre-operative Diagnosis:  37 y.o. 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 Salvadore Dom  Anesthesia: Spinal anesthesia  ASA Class: 2   Procedure Details  CS OP NOTE Information for the patient's newborn:  Ka, Gaydon [[id number]]  @[id number] @  Information for the patient's newborn:  Lesia, Attkisson [[id number]]  @[id number] @    Indication and Consent:  Patient present to OR for for scheduled primary cesarean section due to the above indications. She has received adequate prenatal care in current pregnancy by [facility]. Risks discussed include bleeding, infection, injury to surrounding organs including but not limited to bowel, bladder, and ureters. Also discussed risk of hysterectomy in case of life-threatening emergency, and death. Pt verbalizes understanding and wishes to proceed.  Procedures: The patient was taken to the operating room where anesthesia was found to be adequate and preoperative antibiotics were given for infection prophylaxis. She was prepared and draped in the dorsal supine position with a leftward tilt. A Pfannenstiel skin incision was made with scalpel. The incision was carried down to the fascia with Bovie electrocautery. The fascia was incised and extended laterally with blunt dissection. The rectus muscle was separated in the midline down to the level of pubic symphysis with blunt dissection. Pre-peritoneal fatty tissue was extended superiorly and inferiorly to the  bladder reflection with good visualization of the bladder.   The Alexis-O retractor was inserted and vesicouterine peritoneum identified. Intraabdominal survey revealed scant, clear peritoneal fluid and a thin lower uterine segment. The vesicouterine peritoneum was opened with scissors and the bladder flap was developed. A low transverse uterine incision was made with a scalpel, the amniotic sac was ruptured and clear fluid noted. The uterine incision was extended bluntly with lateral and upward traction.   Twin A was in cephalic presentation. The head was elevated out of the pelvis with special attention paid to avoid using the uterine incision as a fulcrum. Gentle fundal pressure was applied once the head was brought into the incision. The infant was delivered with no difficulty. The mouth and nose were suctioned with a bulb. The cord was clamped and cut after 1 minute delay. The infant was handed off to the awaiting NICU staff for evaluation. Twin B was in frank breech presentation. Amniotic sac rupture with clear fluid noted. The surgeon's hand was placed into the uterine cavity and the fetal sacrum was identified and the buttock delivered gradually through the hysterotomy followed by the legs through to the level of the scapula. The baby was gently rotated and both arms were delivered using the Loveset maneuver. Head flexion was maintained and the head was delivered without difficulty. The cord was clamped and cut after 1 minute delay. The infant was handed off to the awaiting NICU staff for evaluation. Cord blood and cord gasses obtained x 2. The placenta x 2 was spontaneously delivered intact with manual massage of uterine fundus. IV oxytocin was initiated to facilitated uterine contrations. The inside of uterus was wiped clean. The uterine incision was closed first with 0 Vicryl in a locking pattern, second imbricating layer by 0 Monocryl  in a running pattern. Hemostasis was achieved with a figure of  eight stitch using 0 Monocryl.  Blood clots and fluid were wiped out of the abdomen and pelvis with moist laparotomy sponges. The uterine incision was reinspected and good hemostasis was noted.   The peritoneum was closed with 3-0 Vicryl in a continuous pattern. The muscle layer was reapproximated with 2-0 Vicryl in an interupted pattern. The fascial layer was closed with 0 Vicryl suture. Scarpa's fascia was reapproximated with 2-0 plain gut suture in a continuous fashion. The skin was closed with 4-0 monocryl subcuticularly. The patient tolerated the procedure well. All the counts were correct times three.   Findings: Live born female (twin A) and female (Twin B) infant  Estimated Blood Loss:   1621         Urine: 300 mL         Total IV Fluids:  3000 mL         Specimens: Placenta x 2 and Disposition:  Sent to Pathology         Complications:  None; patient tolerated the procedure well.         Disposition: PACU - hemodynamically stable.         Condition: stable  Attending Attestation: I was present and scrubbed and the assistant was required due to complexity of anatomy. Due to the complexity of the case, Dr. Salvadore Dom (OB Fellow)  was asked to serve as surgeon assist. The assistant surgeon aided in safe handling of the pelvic structures, associated retraction, and exposure of the operative field.   Dr. Reesa Chew

## 2022-08-10 NOTE — Anesthesia Procedure Notes (Signed)
Spinal  Patient location during procedure: OR Start time: 08/10/2022 8:47 AM End time: 08/10/2022 8:49 AM Reason for block: surgical anesthesia Staffing Performed: anesthesiologist  Anesthesiologist: Elmer Picker, MD Performed by: Elmer Picker, MD Authorized by: Elmer Picker, MD   Preanesthetic Checklist Completed: patient identified, IV checked, risks and benefits discussed, surgical consent, monitors and equipment checked, pre-op evaluation and timeout performed Spinal Block Patient position: sitting Prep: DuraPrep and site prepped and draped Patient monitoring: cardiac monitor, continuous pulse ox and blood pressure Approach: midline Location: L3-4 Injection technique: single-shot Needle Needle type: Pencan  Needle gauge: 24 G Needle length: 9 cm Assessment Sensory level: T6 Events: CSF return Additional Notes Functioning IV was confirmed and monitors were applied. Sterile prep and drape, including hand hygiene and sterile gloves were used. The patient was positioned and the spine was prepped. The skin was anesthetized with lidocaine.  Free flow of clear CSF was obtained prior to injecting local anesthetic into the CSF.  The spinal needle aspirated freely following injection.  The needle was carefully withdrawn.  The patient tolerated the procedure well.

## 2022-08-11 ENCOUNTER — Other Ambulatory Visit: Payer: Medicaid Other

## 2022-08-11 ENCOUNTER — Ambulatory Visit: Payer: Medicaid Other

## 2022-08-11 LAB — TYPE AND SCREEN
Antibody Screen: NEGATIVE
Unit division: 0
Unit division: 0

## 2022-08-11 LAB — BPAM RBC
Blood Product Expiration Date: 202406052359
Unit Type and Rh: 7300

## 2022-08-11 LAB — SARS CORONAVIRUS 2 BY RT PCR: SARS Coronavirus 2 by RT PCR: NEGATIVE

## 2022-08-11 LAB — COMPREHENSIVE METABOLIC PANEL
ALT: 13 U/L (ref 0–44)
AST: 17 U/L (ref 15–41)
Albumin: <1.5 g/dL — ABNORMAL LOW (ref 3.5–5.0)
Alkaline Phosphatase: 94 U/L (ref 38–126)
Anion gap: 8 (ref 5–15)
BUN: 7 mg/dL (ref 6–20)
CO2: 21 mmol/L — ABNORMAL LOW (ref 22–32)
Calcium: 8.9 mg/dL (ref 8.9–10.3)
Chloride: 108 mmol/L (ref 98–111)
Creatinine, Ser: 0.75 mg/dL (ref 0.44–1.00)
GFR, Estimated: >60 mL/min (ref >60)
Glucose, Bld: 95 mg/dL (ref 70–99)
Potassium: 3.4 mmol/L — ABNORMAL LOW (ref 3.5–5.1)
Sodium: 137 mmol/L (ref 135–145)
Total Bilirubin: 0.5 mg/dL (ref 0.3–1.2)
Total Protein: 5.1 g/dL — ABNORMAL LOW (ref 6.5–8.1)

## 2022-08-11 LAB — CBC
HCT: 25.6 % — ABNORMAL LOW (ref 36.0–46.0)
Hemoglobin: 8.3 g/dL — ABNORMAL LOW (ref 12.0–15.0)
MCH: 25.4 pg — ABNORMAL LOW (ref 26.0–34.0)
MCHC: 32.4 g/dL (ref 30.0–36.0)
MCV: 78.3 fL — ABNORMAL LOW (ref 80.0–100.0)
Platelets: 177 10*3/uL (ref 150–400)
RBC: 3.27 MIL/uL — ABNORMAL LOW (ref 3.87–5.11)
RDW: 22.9 % — ABNORMAL HIGH (ref 11.5–15.5)
WBC: 11.3 10*3/uL — ABNORMAL HIGH (ref 4.0–10.5)
nRBC: 0 % (ref 0.0–0.2)

## 2022-08-11 MED ORDER — POLYSACCHARIDE IRON COMPLEX 150 MG PO CAPS
150.0000 mg | ORAL_CAPSULE | Freq: Every day | ORAL | Status: DC
  Administered 2022-08-11 – 2022-08-13 (×3): 150 mg via ORAL
  Filled 2022-08-11 (×3): qty 1

## 2022-08-11 MED ORDER — MAGNESIUM OXIDE -MG SUPPLEMENT 400 (240 MG) MG PO TABS
400.0000 mg | ORAL_TABLET | Freq: Every day | ORAL | Status: DC
  Administered 2022-08-11 – 2022-08-13 (×3): 400 mg via ORAL
  Filled 2022-08-11 (×4): qty 1

## 2022-08-11 NOTE — Progress Notes (Signed)
Subjective: POD# 1 Information for the patient's newborn:  Berlynn, Padget [[id number]]  female  Information for the patient's newborn:  Dondrea, Caviness [[id number]]  female   Reports feeling "much better" Feeding: pumping breast Reports tolerating PO and denies N/V, foley removed, ambulating and urinating w/o difficulty  Pain controlled with  PO meds Denies HA/SOB/dizziness  Flatus passing and reports having a bowel movement Vaginal bleeding is normal, no clots     Objective:  VS:  Vitals:   08/10/22 1716 08/10/22 2001 08/10/22 2355 08/11/22 0808  BP: 106/74 96/67 94/62  (!) 94/58  Pulse: (!) 126 (!) 105 99 86  Resp:  18 17 18   Temp:  97.9 F (36.6 C) 98 F (36.7 C) 97.6 F (36.4 C)  TempSrc:  Oral Oral Oral  SpO2:  97% 100% 100%  Weight:      Height:        Intake/Output Summary (Last 24 hours) at 08/11/2022 0957 Last data filed at 08/11/2022 0119 Gross per 24 hour  Intake 1000 ml  Output 1590 ml  Net -590 ml     Recent Labs    08/10/22 1913 08/11/22 0558  WBC 20.0* 11.3*  HGB 8.9* 8.3*  HCT 29.4* 25.6*  PLT 198 177    Blood type: --/--/B POS (05/06 1850) Rubella:      Physical Exam:  General: alert, cooperative, and no distress CV: Regular rate and rhythm Resp: clear Abdomen: soft, nontender, normal bowel sounds Incision: honeycomb dressing in C/D/I Uterine Fundus: firm, below umbilicus, nontender Lochia: minimal and no clots Ext:  neg for pain, tenderness, edema, and cords   Assessment/Plan: 37 y.o.   POD# 1. 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

## 2022-08-11 NOTE — Clinical Social Work Maternal (Signed)
CLINICAL SOCIAL WORK MATERNAL/CHILD NOTE  Patient Details  Name: NELROSE WIMBLEY MRN: [id number] Date of Birth: 1985-10-14  Date:  08/11/2022  Clinical Social Worker Initiating Note:  Blaine Hamper Date/Time: Initiated:  08/11/22/1409     Child's Name:  Jeri Modena and Freddy Jaksch   Biological Parents:  Mother, Father (FOB is [place] 11/29/85)   Need for Interpreter:  None   Reason for Referral:  Parental Support of Premature Babies < 32 weeks/or Critically Ill babies   Address:  [id number] [place] [address]    Phone number:  [phone] (home)     Additional phone number:   Household Members/Support Persons (HM/SP):   Household Member/Support Person 1 (MOB reports that she reside with her mother)   HM/SP Name Relationship DOB or Age  HM/SP -1 Jordyn [place] daughter 11/24/2020  HM/SP -2        HM/SP -3        HM/SP -4        HM/SP -5        HM/SP -6        HM/SP -7        HM/SP -8          Natural Supports (not living in the home):  Extended Family, Immediate Family, Parent, Spouse/significant other   Professional Supports: None   Employment: Unemployed   Type of Work:     Education:  Some Materials engineer arranged:    Surveyor, quantity Resources:  Medicaid   Other Resources:  WIC (MOB was provided the information to apply for food stamps.)   Cultural/Religious Considerations Which May Impact Care:  Per [employer] face sheet , MOB is Protestant  Strengths:  Ability to meet basic needs   (Ped list provided to MOB.)   Psychotropic Medications:         Pediatrician:       Pediatrician List:   [facility] [place]    [place]      Pediatrician Fax Number:    Risk Factors/Current Problems:  None   Cognitive State:  Alert  , Able to Concentrate  , Linear Thinking  , Insightful  , Goal Oriented     Mood/Affect:  Bright  , Calm  ,  Comfortable  , Happy  , Interested  , Relaxed     CSW Assessment: CSW met with MOB at MOB's bedside in room 108; twins are in NICU in room 314.  When CSW arrived, MOB was resting in the bed. CSW introduced self and shared need to complete clinical assessment. MOB was receptive to meeting with CSW and was easy to engage.  MOB shared her story of labor and delivery as well as twins admission to NICU and how she felt emotionally throughout her experience.  MOB was open to talking with CSW and sharing her feelings.  She states twins  admission to NICU was "scary and overwhelming." CSW validated and normalized MOB's thoughts and feelings and mde MOB aware of other emotion MOB my experience during the postpartum period.  CSW assisted her in identifying strengths, which she was able to.  MOB reports being pleased with twins progress since NICU admission.   CSW assessed for MH hx and MOB denied any hx.  MOB also denied SA hx. Per MOB, she has reliable transportation and their are no barriers to her visiting with the  twins post MOB's discharge.   MOB communicated needing help obtaining essential items for twins.  Per MOB she has one car seat and one crib.  CSW agreed to reach out to FSN to request "Baby Basic" items for family; CSW communicated request to [facility] with FSN.  MOB shared feeling well informed by NICU team and she had appeared to have a good understanding about twins health.  Without prompting MOB shared Twin B will have genetic testing for possible T21.  MOB denied having any questions or concern.   CSW will continue to offer resources and supports to family while infant remains in NICU.    CSW Plan/Description:  Psychosocial Support and Ongoing Assessment of Needs, Sudden Infant Death Syndrome (SIDS) Education, Perinatal Mood and Anxiety Disorder (PMADs) Education, Other Patient/Family Education, Other Information/Referral to [employer], MSW, [employer] Clinical Social  Work [phone] Barbara Cower, LCSW 08/11/2022, 2:13 PM

## 2022-08-11 NOTE — Lactation Note (Signed)
This note was copied from a baby's chart.  NICU Lactation Consultation Note  Patient Name: Monica Green ZOXWR'U Date: 08/11/2022 Age:38 hours  Reason for consult: Follow-up assessment; NICU baby; Multiple gestation; Late-preterm 34-36.6wks  SUBJECTIVE  LC in to visit with P2 Mom of LPT twins in the NICU delivered by C/Section.  Mom has pumped 4 times and has taken EBM to NICU for babies.  Mom provided with a hand's free pumping band and encouraged to pump more often now.  She was able to do STS with one twin and plans to do STS with the other baby tonight.  Talked about the benefit of pumping during or right after STS.  Encouraged hands on pumping as well.  Mom provided with a bin for drying her pump parts, reminding her to disassemble all the parts when washing and drying.    Mom provided with a pumping log.  Encouraged pumping 8+ times per 24 hrs to establish a full milk supply. Mom aware of WIC pump loaner over the weekend if she doesn't room in with babies.  OBJECTIVE Infant data: Mother's Current Feeding Choice: Breast Milk and Donor Milk  Infant feeding assessment Scale for Readiness: 3   Maternal data: G2P1103  C-Section, Low Transverse Has patient been taught Hand Expression?: Yes Hand Expression Comments: colostrum noted Significant Breast History:: no breast changes with pregnancy Current breast feeding challenges:: NICU LPT twins Previous breastfeeding challenges?: Low milk supply Does the patient have breastfeeding experience prior to this delivery?: Yes How long did the patient breastfeed?: 2 weeks Pumping frequency: Mom has pumped 4 times, encouraged more frequent pumping Pumped volume: 5 mL Flange Size: 21 Risk factor for low milk supply:: Infant separation  WIC Program: Yes WIC Referral Sent?: Yes What county?: [place]  ASSESSMENT Infant: Feeding Status: Scheduled 8-11-2-5  Maternal: Milk volume:  Normal  INTERVENTIONS/PLAN Interventions: Interventions: Breast feeding basics reviewed; Skin to skin; Breast massage; Hand express; Education; DEBP; Hand pump Tools: Pump; Flanges; Hands-free pumping top Pump Education: Setup, frequency, and cleaning; Milk Storage  Plan: Consult Status: NICU follow-up NICU Follow-up type: Verify onset of copious milk; Verify absence of engorgement   Judee Clara 08/11/2022, 5:09 PM

## 2022-08-12 MED ORDER — HYDROXYZINE HCL 50 MG PO TABS
50.0000 mg | ORAL_TABLET | Freq: Three times a day (TID) | ORAL | Status: DC | PRN
  Administered 2022-08-12: 50 mg via ORAL
  Filled 2022-08-12: qty 1

## 2022-08-12 MED ORDER — OXYCODONE-ACETAMINOPHEN 5-325 MG PO TABS: 1.0000 | ORAL_TABLET | Freq: Four times a day (QID) | ORAL | 0 refills | Status: AC | PRN

## 2022-08-12 MED ORDER — OXYCODONE-ACETAMINOPHEN 5-325 MG PO TABS
1.0000 | ORAL_TABLET | Freq: Four times a day (QID) | ORAL | Status: DC | PRN
  Administered 2022-08-12 – 2022-08-13 (×2): 2 via ORAL
  Filled 2022-08-12 (×2): qty 2

## 2022-08-12 MED ORDER — MAGNESIUM OXIDE -MG SUPPLEMENT 400 (240 MG) MG PO TABS: 400.0000 mg | ORAL_TABLET | Freq: Every day | ORAL | 0 refills | Status: DC

## 2022-08-12 MED ORDER — ACETAMINOPHEN 325 MG PO TABS
650.0000 mg | ORAL_TABLET | Freq: Four times a day (QID) | ORAL | Status: DC
  Filled 2022-08-12: qty 2

## 2022-08-12 MED ORDER — ACETAMINOPHEN 325 MG PO TABS: 650.0000 mg | ORAL_TABLET | Freq: Four times a day (QID) | ORAL | Status: DC

## 2022-08-12 MED ORDER — IBUPROFEN 600 MG PO TABS: 600.0000 mg | ORAL_TABLET | Freq: Four times a day (QID) | ORAL | 0 refills | Status: DC

## 2022-08-12 NOTE — Lactation Note (Addendum)
This note was copied from a baby's chart.  NICU Lactation Consultation Note  Patient Name: Boy Kasmira Lukowski ZOXWR'U Date: 08/12/2022 Age:38 hours  Reason for consult: Follow-up assessment; NICU baby; Late-preterm 34-36.6wks; Multiple gestation; Infant < 6lbs  SUBJECTIVE  LC in to visit with Mom of twin LPT baby boys in the NICU.  Both babies are being gavage fed donor breast milk.  Mom has been pumping consistently every 3 hrs.  She is depressed as she isn't expressing more than drops today.  Reassured her that this was NORMAL and not to give up.  Explained that milk could increase to volume after 5-6 days of STS and frequent pumping and hand expressing.  Mom isn't feeling as well today, encouraged Mom to rest as much as possible.  Discharge planned for tomorrow.  Encouraged doing STS with babies if she can.  Mom aware of [facility] loaner pump will be available if she goes home without a pump.  WIC to provide a DEBP on Monday per Mom.   OBJECTIVE Infant data: No data recorded Infant feeding assessment Scale for Readiness: 2   Maternal data: G2P1103  C-Section, Low Transverse Significant Breast History:: no breast changes with pregnancy Current breast feeding challenges:: NICU LPT twins Previous breastfeeding challenges?: Low milk supply Does the patient have breastfeeding experience prior to this delivery?: Yes How long did the patient breastfeed?: 2 weeks Pumping frequency: every 3 hrs Pumped volume: 0 mL (drops) Flange Size: 21 Risk factor for low milk supply:: Infant separation  WIC Program: Yes WIC Referral Sent?: Yes What county?: [place]  ASSESSMENT Infant: Feeding Status: Scheduled 8-11-2-5  Maternal: Milk volume: Normal  INTERVENTIONS/PLAN Interventions: Interventions: Breast feeding basics reviewed; Skin to skin; Breast massage; Hand express; Education; DEBP; Hand pump Tools: Pump; Flanges; Hands-free pumping top Pump Education: Setup, frequency, and  cleaning; Milk Storage  Plan: Consult Status: NICU follow-up NICU Follow-up type: Verify onset of copious milk; Verify absence of engorgement; New admission follow up   Judee Clara 08/12/2022, 10:14 AM

## 2022-08-12 NOTE — Discharge Summary (Incomplete)
PCS OB Discharge Summary       Patient Name: Monica Green DOB: [birth year] MRN: [id number]  Date of admission: 08/07/2022 Delivering MD:    Corabel, Lubow Kingsbury [[id number]]  Satina, [place] [[id number]]  Reesa Chew D  Date of delivery: 08/10/2022 Type of delivery: PCS  Newborn Data: Sex: 60 A Female and Nile Riggs Female   Nikiesha, Morasch [[id number]]  Live born female  Birth Weight: 4 lb 2 oz (1870 g) APGAR: 9, 10  Newborn Delivery   Birth date/time: 08/10/2022 09:14:00 Delivery type: C-Section, Low Transverse Trial of labor: No C-section categorization: Primary       Donnica, Tabor [[id number]]  Live born female , 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   Admitting diagnosis: Fetal heart rate decelerations affecting management of mother [O36.8390] Preterm contractions [O47.00] Intrauterine pregnancy: [redacted]w[redacted]d     Secondary diagnosis:  Principal Problem:   Fetal heart rate decelerations affecting management of mother Active Problems:   IDA (iron deficiency anemia)   Preterm contractions   Status post primary low transverse cesarean section   Preterm delivery   Postpartum hemorrhage   Normal postpartum course                                Complications: Hemorrhage>1065mL                                                              Intrapartum Procedures: cesarean: low cervical, transverse Postpartum Procedures: none Complications-Operative and Postpartum: none Augmentation: AROM and at  time of delivery    History of Present Illness: Ms. Monica Green is a 37 y.o. female, G2P1103, who presents at [redacted]w[redacted]d weeks gestation. The patient has been followed at  [facility]  Her pregnancy has been complicated by:  HPI on 08/07/2022: Monica Green is a 37 y.o. female, G2P1001, IUP at 27 weeks with DiDi pregnancy, presenting for preterm contraction (S/P procardia 10mg  in MAU) and fetal decel noted in Twin B (which appears ot have resolved). H/O UC with increased BM over this past week, h/o Eclampsia ni previous pregnancy, was placed on keppra but does not take.  Pt endorse + Fm. Denies vaginal leakage. Denies vaginal bleeding.  I did not see or assess this pt, HP written off verbal PE with Dr Su Hilt.   Prenatal Problem: abnormal chromosomal and genetic finding on antenatal screening of mother (Elevated Trisomy 21 risk for this pregnancy.  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  Declined amnio. on twin B) advanced maternal age gravida Chronic  anemia (Hgb-9.1 at current NOB, Rx for Fe.  Prior pregnancy 6.6 at NOB, referred to Hematology., had IV Venofer infusions and  2 U PRBC.  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  Seeing peds cardiology at [facility] in 3rd trimester for recommendations for place of delivery.  OK to deliver at [facility].) dichorionic diamniotic twin pregnancy Doppler studies abnormal (Twin B with intermittent absent end diastolic flow at 32 weeks, close monitoring.  Frequency of testing increased to 2x/week at 33 weeks.) fetal growth restriction history of pre-eclampsia (last pregnancy, then eclampsia PP. Seizure 10 days PP, placed on Keppra. 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) ulcerative colitis (Dx 2017, no  meds, followed at [facility] GI) vitamin D deficiency (Noted at NOB, rx'd supplementation, recheck PP.)    Patient Active Problem List   Diagnosis Date Noted   Preterm delivery 08/12/2022   Postpartum hemorrhage 08/12/2022   Normal postpartum course 08/12/2022   Status post primary low transverse cesarean section 08/10/2022   Fetal heart rate decelerations affecting management of mother 08/07/2022   Preterm contractions 08/07/2022   Dichorionic diamniotic twin pregnancy, antepartum 07/24/2022   Abnormal maternal serum screening test 07/24/2022   AMA (advanced maternal age) primigravida 35+, third trimester 11/23/2020   Intrauterine growth restriction affecting antepartum care of mother in third trimester, fetus 2 11/23/2020   IDA (iron deficiency anemia) 07/29/2020   Ulcerative colitis (HCC) 04/08/2015     Active Ambulatory Problems    Diagnosis Date Noted   IDA (iron deficiency anemia) 07/29/2020   Ulcerative colitis (HCC) 04/08/2015   AMA (advanced maternal age) primigravida 35+, third trimester 11/23/2020   Intrauterine growth restriction affecting antepartum care of mother in third trimester, fetus 2 11/23/2020   Dichorionic diamniotic twin pregnancy, antepartum 07/24/2022   Abnormal maternal serum screening test 07/24/2022   Resolved Ambulatory Problems    Diagnosis Date Noted   Microcytic hypochromic anemia 01/05/2015   Symptomatic anemia 01/05/2015   Ureterolithiasis 01/06/2015   Bacterial vaginosis 01/06/2015   AP (abdominal pain) 03/21/2015   Bloody diarrhea    Generalized abdominal pain    Hypokalemia    Rectal bleed 03/21/2015   Protein-calorie malnutrition, severe 03/22/2015   Symptomatic anemia 02/17/2020   Thrombocytosis 03/03/2020   Hypokalemia 11/04/2020   Gestational hypertension 11/05/2020   Pre-eclampsia in third trimester 11/23/2020   Term newborn delivered vaginally, current hospitalization 11/24/2020   SVD (8/24) 11/24/2020   Normal postpartum course  11/26/2020   Eclampsia 12/04/2020   Past Medical History:  Diagnosis Date   Anemia    Colitis    Pre-eclampsia      Hospital Course--Scheduled Cesarean: Patient was admitted on 5/6 for a scheduled PCS cesarean delivery.   She was taken to the operating room, where Dr. Alyse Low performed a Primary LTCS under spinal anesthesia for 36 y.o. G2P1001 at [redacted]w[redacted]d with Di-di twin gestation  Twin B: Fetal heart rate decelerations, AV malformation vs. VSD, hypoplastic nasal bone, abnormal UA dopplers (previously intermittent absent, now elevated), Severe FGR, marginal cord insertion Elevated Trisomy 21 risk , with delivery of a viable baby A female, and baby B female with AVSD, possible T21 and herniated liver, with weight and Apgars as listed above. Infants were preterm at 34.3 weeks and went to the NICU. The patient was taken to recovery in good condition.  Patient planned to pump feed.  On post-op day 1, patient was doing well, tolerating a regular diet, with Hgb of 10.4-8.3 with PPH of QBL .  Throughout her  stay, her physical exam was WNL, her incision was CDI, and her vital signs remained stable.  By post-op day 1, she was up ad lib, tolerating a regular diet, with good pain control with po med.  She was deemed to have received the full benefit of her hospital stay, and was discharged home in stable condition.  Contraceptive choice was unknown. Dr Normand Sloop assessing this pt, this provider only wrote this note off verbal report from Dr Normand Sloop.     Physical exam  Vitals:   08/12/22 1851 08/12/22 2123 08/13/22 0432 08/13/22 0433  BP: 109/67 118/64  102/61  Pulse: (!) 107 (!) 119  96  Resp: 16 18  16   Temp: 98 F (36.7 C) 98.6 F (37 C) 98.1 F (36.7 C)   TempSrc: Oral Oral Oral   SpO2: 99% 98%  100%  Weight:      Height:       General: alert Lochia: appropriate Uterine Fundus: firm Incision: Healing well with no significant drainage, No significant erythema, Dressing is clean, dry, and  intact, honeycomb dressing CDI DVT Evaluation: No evidence of DVT seen on physical exam. Negative Homan's sign. No cords or calf tenderness.  Labs: Lab Results  Component Value Date   WBC 11.3 (H) 08/11/2022   HGB 8.3 (L) 08/11/2022   HCT 25.6 (L) 08/11/2022   MCV 78.3 (L) 08/11/2022   PLT 177 08/11/2022      Latest Ref Rng & Units 08/11/2022    5:58 AM  CMP  Glucose 70 - 99 mg/dL 95   BUN 6 - 20 mg/dL 7   Creatinine 0.98 - 1.19 mg/dL 1.47   Sodium 829 - 562 mmol/L 137   Potassium 3.5 - 5.1 mmol/L 3.4   Chloride 98 - 111 mmol/L 108   CO2 22 - 32 mmol/L 21   Calcium 8.9 - 10.3 mg/dL 8.9   Total Protein 6.5 - 8.1 g/dL 5.1   Total Bilirubin 0.3 - 1.2 mg/dL 0.5   Alkaline Phos 38 - 126 U/L 94   AST 15 - 41 U/L 17   ALT 0 - 44 U/L 13     Date of discharge: 08/13/2022 Discharge Diagnoses:  Preterm delivery with NRFHTs of fetus B.  Discharge instruction: per After Visit Summary and "Baby and Me Booklet".  After visit meds:   Activity:           unrestricted and pelvic rest Advance as tolerated. Pelvic rest for 6 weeks.  Diet:                routine Medications: PNV, Ibuprofen, Colace, Iron, and Oxy IR Postpartum contraception: Undecided Condition:  Pt discharge to home without baby in stable condition Anemia: PO Iron Chrons, f/u with gastro H/O Anxiety: 2 week mood check   Meds: Allergies as of 08/13/2022   No Known Allergies      Medication List     TAKE these medications    B-12 1000 MCG Subl Place 1,000 mcg under the tongue daily.   ibuprofen 600 MG tablet Commonly known as: ADVIL Take 1 tablet (600 mg total) by mouth every 6 (six) hours.   iron polysaccharides 150 MG capsule Commonly known as: NIFEREX Take 1 capsule (150 mg total) by mouth daily.   magnesium oxide 400 (240 Mg) MG tablet Commonly known as: MAG-OX Take 1 tablet (400 mg total) by mouth daily.   oxyCODONE-acetaminophen 5-325 MG tablet Commonly known as: PERCOCET/ROXICET Take 1-2  tablets by mouth every 6 (six)  hours as needed for up to 5 days for severe pain.   potassium chloride SA 20 MEQ tablet Commonly known as: KLOR-CON M Take 1 tablet (20 mEq total) by mouth 2 (two) times daily.   prenatal multivitamin Tabs tablet Take 1 tablet by mouth daily at 12 noon. What changed: when to take this               Discharge Care Instructions  (From admission, onward)           Start     Ordered   08/13/22 0000  Discharge wound care:       Comments: Take dressing off on day 5-7 postpartum.  Report increased drainage, redness or warmth. Clean with water, let soap trickle down body. Can leave steri strips on until they fall off or take them off gently at day 10. Keep open to air, clean and dry.   08/13/22 0919   08/12/22 0000  Discharge wound care:       Comments: Take dressing off on 08/14/22, remove it sooner if it is dirty or damaged. Clean area with soap and water and pat dry. You can leave the steri strips on until they fall off or take them off gently by 08/17/22. Call the office for increased drainage, redness, pain, or warmth. Keep the incision area clean and dry at all times.   08/12/22 0951            Discharge Follow Up:   Follow-up Information     Jackie Plum, MD. Schedule an appointment as soon as possible for a visit in 6 week(s).   Specialty: Obstetrics and Gynecology Contact information: [address], [address] [place] [address] [phone]         [facility]. Schedule an appointment as soon as possible for a visit in 2 week(s).   Specialty: Obstetrics and Gynecology Why: 2 week mood check and 6 weeks PPV Contact information: [address] [place] [address] [phone]                 [facility] CNM, FNP-C, PMHNP-BC  [id number]  [place], [place] [address]  Cell: [phone]  Office Phone: [phone] Fax:  [phone] 08/13/2022  9:19 AM

## 2022-08-12 NOTE — Progress Notes (Signed)
Subjective: POD# 2 Information for the patient's newborn:  Jennalynn, Mccree [[id number]]  female  Information for the patient's newborn:  Irna, Maddaloni [[id number]]  female     Reports feeling tired and more sore today than yesterday. Pt has made several trips to the NICU since early morning.  Feeding: breast pumping Reports tolerating PO and denies N/V, foley removed, ambulating and urinating w/o difficulty  Pain controlled with  PO meds, meds adjusted to better manage pain.  Denies HA/SOB/dizziness  Flatus passing Vaginal bleeding is normal, no clots     Objective:  VS:  Vitals:   08/11/22 1246 08/11/22 1920 08/12/22 0512 08/12/22 0908  BP: 100/64 (!) 93/58 98/64 91/60   Pulse: 99 90 96 99  Resp: 18 18 18 18   Temp: 97.7 F (36.5 C) 97.6 F (36.4 C) 97.7 F (36.5 C) 97.7 F (36.5 C)  TempSrc: Oral Oral Oral Oral  SpO2: 100% 100% 100% 99%  Weight:      Height:       No intake or output data in the 24 hours ending 08/12/22 0936   Recent Labs    08/10/22 1913 08/11/22 0558  WBC 20.0* 11.3*  HGB 8.9* 8.3*  HCT 29.4* 25.6*  PLT 198 177    Blood type: --/--/B POS (05/06 1850) Rubella:      Physical Exam:  General: alert, cooperative, and no distress CV: Regular rate and rhythm Resp: clear Abdomen: soft, nontender, normal bowel sounds Incision: clean, dry, intact, and honeycomb dressing remains in place Perineum:  Uterine Fundus: firm, below umbilicus, nontender Lochia: minimal Ext:  neg for pain, tenderness, and cords. 1+ non-pitting edema present   Assessment/Plan: 37 y.o.   POD# 1. [id number]                  Principal Problem:   Fetal heart rate decelerations affecting management of mother Active Problems:   IDA (iron deficiency anemia)   Preterm contractions   Status post primary low transverse cesarean section   Routine post-op PP care          Advance diet as tolerated Advised warm fluids and ambulation to improve GI  motility Breastfeeding support Anticipate D/C 08/13/22  Roma Schanz, DNP, CNM 08/12/2022, 9:36 AM

## 2022-08-13 ENCOUNTER — Ambulatory Visit (HOSPITAL_COMMUNITY): Payer: Self-pay

## 2022-08-13 NOTE — Lactation Note (Signed)
This note was copied from a baby's chart. Lactation Consultation Note  Patient Name: Monica Green Date: 08/13/2022 Age:38 days   Attempted to visit with family but they have already left, Monica Green was discharged today. Noticed that she had left some of her pump parts behind (tubing) in her room in [facility]. This LC took pump parts and placed them in babies' room in 314; NICU RN Katie H. notified. Asked RN to call lactation if Monica Green comes back but have not heard back until the end of this LC's shift. NICU lactation to follow up once she returns to the unit to review discharge education and check on pumping status.    Jago Carton Venetia Constable 08/13/2022, 6:51 PM

## 2022-08-14 ENCOUNTER — Ambulatory Visit: Payer: Medicaid Other

## 2022-08-14 LAB — SURGICAL PATHOLOGY

## 2022-08-15 ENCOUNTER — Encounter: Payer: Self-pay | Admitting: Hematology

## 2022-08-15 ENCOUNTER — Ambulatory Visit (HOSPITAL_COMMUNITY): Payer: Self-pay

## 2022-08-15 NOTE — Lactation Note (Signed)
This note was copied from a baby's chart.  NICU Lactation Consultation Note  Patient Name: Monica Green [id number] Date: 08/15/2022 Age:38 days  Reason for consult: Follow-up assessment; NICU baby; Multiple gestation; Late-preterm 34-36.6wks; Infant < 6lbs; Engorgement  SUBJECTIVE  LC in to visit with Mom of LPT twins while she was on the phone with [facility] who was present and holding baby girl B.  Both babies are being gavage fed donor breast milk +/expressed breast milk.  Mom shared that she hasn't pumped in a day and breasts are full and heavy.  Talked to Mom regarding engorgement prevention and treatment.  Mom was not able to go to [facility] due to an ulcerative colitis flare up.  FOB purchased a pump, but Mom wasn't able to tell LC the brand.  Mom can hand express at least, but recommended icing her breasts while lying flat in bed for 20 mins prior to pumping.  Mom to repeat this prn.  Mom is to reschedule her [facility] appt.  Encouraged Mom to come to NICU if she can to use the pump in the baby's room.  OBJECTIVE Infant data: No data recorded Infant feeding assessment Scale for Readiness: 3   Maternal data: G2P1103  C-Section, Low Transverse Pumping frequency: Mom has not pumped today, breasts are engorged Flange Size: 21  WIC Program: Yes WIC Referral Sent?: Yes What county?: [place] Pump: Personal (FOB purchased a pump and Mom unsure of brand)  ASSESSMENT Infant: Feeding Status: Scheduled 8-11-2-5  Maternal: Milk volume: Low  INTERVENTIONS/PLAN Interventions: Discharge Education: Engorgement and breast care Tools: Pump; Flanges; Hands-free pumping top  Plan: Consult Status: NICU follow-up NICU Follow-up type: Verify absence of engorgement; Verify onset of copious milk; Weekly NICU follow up   Judee Clara 08/15/2022, 3:53 PM

## 2022-08-16 ENCOUNTER — Encounter: Payer: Self-pay | Admitting: Hematology

## 2022-08-17 ENCOUNTER — Other Ambulatory Visit: Payer: Medicaid Other

## 2022-08-17 ENCOUNTER — Ambulatory Visit: Payer: Medicaid Other

## 2022-08-21 ENCOUNTER — Telehealth (HOSPITAL_COMMUNITY): Payer: Self-pay | Admitting: *Deleted

## 2022-08-21 NOTE — Telephone Encounter (Addendum)
Mom reports feeling okay. Just returned from [facility] office where they had to redress her incision due to stitches pulling out. No concerns regarding herself at this time. EPDS declined (hospital score=3) Mom reports babies are still in NICU.  Duffy Rhody, RN 08-21-2022 at 1:53pm

## 2022-08-22 ENCOUNTER — Other Ambulatory Visit: Payer: Medicaid Other

## 2022-08-22 ENCOUNTER — Ambulatory Visit: Payer: Medicaid Other

## 2022-08-23 ENCOUNTER — Encounter (HOSPITAL_COMMUNITY): Payer: Self-pay | Admitting: Obstetrics and Gynecology

## 2022-08-23 ENCOUNTER — Inpatient Hospital Stay (HOSPITAL_COMMUNITY)
Admission: AD | Admit: 2022-08-23 | Discharge: 2022-08-23 | Disposition: A | Discharge status: Home / Self Care | Payer: Medicaid Other | Attending: Obstetrics and Gynecology | Admitting: Obstetrics and Gynecology

## 2022-08-23 DIAGNOSIS — O909 Complication of the puerperium, unspecified: Secondary | ICD-10-CM | POA: Insufficient documentation

## 2022-08-23 DIAGNOSIS — K5190 Ulcerative colitis, unspecified, without complications: Secondary | ICD-10-CM | POA: Insufficient documentation

## 2022-08-23 DIAGNOSIS — O900 Disruption of cesarean delivery wound: Secondary | ICD-10-CM

## 2022-08-23 DIAGNOSIS — Z3A00 Weeks of gestation of pregnancy not specified: Secondary | ICD-10-CM | POA: Diagnosis not present

## 2022-08-23 MED ORDER — OXYCODONE HCL 5 MG PO TABS
5.0000 mg | ORAL_TABLET | Freq: Once | ORAL | Status: AC
  Administered 2022-08-23: 5 mg via ORAL
  Filled 2022-08-23: qty 1

## 2022-08-23 MED ORDER — IBUPROFEN 800 MG PO TABS
400.0000 mg | ORAL_TABLET | Freq: Once | ORAL | Status: AC
  Administered 2022-08-23: 400 mg via ORAL
  Filled 2022-08-23: qty 1

## 2022-08-23 NOTE — MAU Note (Signed)
...  Monica Green is a 37 y.o. at [redacted]w[redacted]d post aprtum c/s here in MAU reporting: Left sided incisional pain and drainage. She reports she had an appointment this past Monday in office and her incision was packed and covered. She reports she was told not to remove the bandage that was on top of the incision but she reports it came off on its own. She reports the fluids leaking are white/creamy and will at times contain blood. She reports the incisional pain increases with moving and walking. Denies fever.  Last took an Oxycodone at 0500 this morning.   Has appointment scheduled for later this evening. She reports she has been taking antibiotics since Monday as well.   Onset of complaint:  Pain score: 8/10 incision  Lab orders placed from triage:  none

## 2022-08-23 NOTE — MAU Provider Note (Signed)
History     [id number]  Arrival date and time: 08/23/22 4098    Chief Complaint  Patient presents with   Incisional Pain     HPI Monica Green is a 37 y.o. s/p primary cesarean for di/di twins at 21 wks on 08/10/2022, AMA, ulcerative colitis (not on DMR), who presents for wound pain.   Discharge summary reviewed in detail, had pLTCS for NR fetal status of twin B, OBH during delivery, did well post op  Patient reports that about two days ago the left side of her cesarean incision opened up and has been having lots of drainage No significant bleeding Has been very painful Seen at [facility] and wound was packed, and she was started on keflex Denies any fevers since then Is scheduled out into the future for regular wound packing Reports that she was having so much pain she decided to come here first She has been using oxycodone sparingly and ibuprofen slightly more regularly Last oxycodone around 0500 today, no ibuprofen since last night   --/--/B POS (05/06 1850)  OB History     Gravida  2   Para  2   Term  1   Preterm  1   AB      Living  3      SAB      IAB      Ectopic      Multiple  1   Live Births  3           Past Medical History:  Diagnosis Date   Anemia    Colitis    Eclampsia 12/04/2020   IDA (iron deficiency anemia)    Normal postpartum course 11/26/2020   Pre-eclampsia    Pre-eclampsia in third trimester 11/23/2020   SVD (8/24) 11/24/2020   Term newborn delivered vaginally, current hospitalization 11/24/2020   Thrombocytosis 03/03/2020   Ulcerative colitis (HCC)     Past Surgical History:  Procedure Laterality Date   CESAREAN SECTION     CESAREAN SECTION MULTI-GESTATIONAL N/A 08/10/2022   Procedure: CESAREAN SECTION MULTI-GESTATIONAL;  Surgeon: Jackie Plum, MD;  Location: [facility]S;  Service: Obstetrics;  Laterality: N/A;   FLEXIBLE SIGMOIDOSCOPY N/A 03/23/2015   Procedure: FLEXIBLE SIGMOIDOSCOPY;  Surgeon: Carman Ching, MD;  Location: [facility] ENDOSCOPY;  Service: Endoscopy;  Laterality: N/A;    Family History  Problem Relation Age of Onset   Hypertension Father    Cancer Father    Colon cancer Father    Asthma Sister    Asthma Brother    Hypertension Paternal Grandfather    Diabetes Neg Hx    Heart disease Neg Hx     Social History   Socioeconomic History   Marital status: Single    Spouse name: Not on file   Number of children: 1   Years of education: Not on file   Highest education level: Not on file  Occupational History   Not on file  Tobacco Use   Smoking status: Never   Smokeless tobacco: Never  Vaping Use   Vaping Use: Never used  Substance and Sexual Activity   Alcohol use: Not Currently    Comment: Social   Drug use: No   Sexual activity: Yes  Other Topics Concern   Not on file  Social History Narrative   Not on file   Social Determinants of Health   Financial Resource Strain: Not on file  Food Insecurity: Not on file  Transportation Needs: Not on file  Physical Activity: Not on file  Stress: Not on file  Social Connections: Not on file  Intimate Partner Violence: Not on file    No Known Allergies  No current facility-administered medications on file prior to encounter.   Current Outpatient Medications on File Prior to Encounter  Medication Sig Dispense Refill   ibuprofen (ADVIL) 600 MG tablet Take 1 tablet (600 mg total) by mouth every 6 (six) hours. 30 tablet 0   iron polysaccharides (NIFEREX) 150 MG capsule Take 1 capsule (150 mg total) by mouth daily. 30 capsule 5   magnesium oxide (MAG-OX) 400 (240 Mg) MG tablet Take 1 tablet (400 mg total) by mouth daily. 30 tablet 0   Cyanocobalamin (B-12) 1000 MCG SUBL Place 1,000 mcg under the tongue daily. 30 tablet 11   potassium chloride SA (KLOR-CON M) 20 MEQ tablet Take 1 tablet (20 mEq total) by mouth 2 (two) times daily. 14 tablet 0   Prenatal Vit-Fe Fumarate-FA (PRENATAL MULTIVITAMIN) TABS tablet Take 1  tablet by mouth daily at 12 noon. (Patient taking differently: Take 1 tablet by mouth daily.) 60 tablet 3     ROS Pertinent positives and negative per HPI, all others reviewed and negative  Physical Exam   BP 112/80 (BP Location: Right Arm)   Pulse 73   Temp 98.7 F (37.1 C) (Oral)   Resp 18   Ht 5\' 5"  (1.651 m)   Wt 62.3 kg   SpO2 100%   Breastfeeding No   BMI 22.85 kg/m   Patient Vitals for the past 24 hrs:  BP Temp Temp src Pulse Resp SpO2 Height Weight  08/23/22 1047 112/80 -- -- 73 -- -- -- --  08/23/22 1022 -- 98.7 F (37.1 C) Oral 72 18 100 % -- --  08/23/22 1018 -- -- -- -- -- -- 5\' 5"  (1.651 m) 62.3 kg    Physical Exam Vitals and nursing note reviewed.  Constitutional:      General: She is not in acute distress.    Appearance: Normal appearance. She is not ill-appearing, toxic-appearing or diaphoretic.  HENT:     Head: Normocephalic and atraumatic.     Mouth/Throat:     Mouth: Mucous membranes are moist.  Eyes:     General: No scleral icterus. Pulmonary:     Effort: Pulmonary effort is normal.  Abdominal:     General: Abdomen is flat.     Palpations: Abdomen is soft.  Skin:    Comments: Approximately 3 cm wound dehisence just central to L corner of cesarean incision, appears to have a depth of at least 2 cm and is packed with soaked gauze. Surrounding skin is not erythematous or hot to the touch. On repacking massive amount of 1 inch gauze was removed, cavity probes 9-10 cm medially, about 3-4 cm superiorly, and about 2 cm laterally. Packing replaced with all but about 6 inches of a bottle of 5 yards of 1/2 inch gauze  Neurological:     Mental Status: She is alert.      Cervical Exam    Bedside Ultrasound Not performed.  My interpretation: n/a   Labs No results found for this or any previous visit (from the past 24 hour(s)).  Imaging No results found.  MAU Course  Procedures Lab Orders  No laboratory test(s) ordered today   Meds  ordered this encounter  Medications   oxyCODONE (Oxy IR/ROXICODONE) immediate release tablet 5 mg   ibuprofen (ADVIL) tablet 400 mg   Imaging Orders  No imaging studies ordered today    MDM Moderate (Level 3-4)  Assessment and Plan  #Cesarean wound dehisence Wound without signs of significant super infection, patient is already on antibiotics and scheduled for regular follow up in the office. Packing from two days prior removed from a cavity of significant size (see physical exam). Packing replaced with all but 6 inches of a bottle of 5 yard length 1/2 inch gauze packing. Discussed expectations with patient that a defect of this size can take weeks to close fully and will need regular wound care. Routine return precautions. Notified Dr. Normand Sloop that patient did not plan to go to her appointment today but was repacked in MAU and will present on Friday for wound care.    Dispo: discharged to home in stable condition    Venora Maples, MD/MPH 08/23/22 1:20 PM  Allergies as of 08/23/2022   No Known Allergies      Medication List     TAKE these medications    B-12 1000 MCG Subl Place 1,000 mcg under the tongue daily.   ibuprofen 600 MG tablet Commonly known as: ADVIL Take 1 tablet (600 mg total) by mouth every 6 (six) hours.   iron polysaccharides 150 MG capsule Commonly known as: NIFEREX Take 1 capsule (150 mg total) by mouth daily.   magnesium oxide 400 (240 Mg) MG tablet Commonly known as: MAG-OX Take 1 tablet (400 mg total) by mouth daily.   potassium chloride SA 20 MEQ tablet Commonly known as: KLOR-CON M Take 1 tablet (20 mEq total) by mouth 2 (two) times daily.   prenatal multivitamin Tabs tablet Take 1 tablet by mouth daily at 12 noon. What changed: when to take this

## 2022-08-25 ENCOUNTER — Ambulatory Visit: Payer: Medicaid Other

## 2022-08-25 ENCOUNTER — Other Ambulatory Visit: Payer: Medicaid Other

## 2022-08-27 ENCOUNTER — Inpatient Hospital Stay (HOSPITAL_COMMUNITY)
Admission: AD | Admit: 2022-08-27 | Discharge: 2022-08-31 | DRG: 769 | Disposition: A | Discharge status: Home / Self Care | Payer: Medicaid Other | Attending: Obstetrics and Gynecology | Admitting: Obstetrics and Gynecology

## 2022-08-27 ENCOUNTER — Encounter (HOSPITAL_COMMUNITY): Payer: Self-pay | Admitting: Obstetrics and Gynecology

## 2022-08-27 ENCOUNTER — Other Ambulatory Visit: Payer: Self-pay

## 2022-08-27 ENCOUNTER — Inpatient Hospital Stay (HOSPITAL_COMMUNITY): Payer: Medicaid Other

## 2022-08-27 DIAGNOSIS — I10 Essential (primary) hypertension: Secondary | ICD-10-CM | POA: Diagnosis not present

## 2022-08-27 DIAGNOSIS — L089 Local infection of the skin and subcutaneous tissue, unspecified: Principal | ICD-10-CM | POA: Diagnosis present

## 2022-08-27 DIAGNOSIS — O900 Disruption of cesarean delivery wound: Secondary | ICD-10-CM | POA: Diagnosis present

## 2022-08-27 DIAGNOSIS — O99285 Endocrine, nutritional and metabolic diseases complicating the puerperium: Secondary | ICD-10-CM | POA: Diagnosis present

## 2022-08-27 DIAGNOSIS — T8149XA Infection following a procedure, other surgical site, initial encounter: Secondary | ICD-10-CM

## 2022-08-27 DIAGNOSIS — D75839 Thrombocytosis, unspecified: Secondary | ICD-10-CM | POA: Diagnosis present

## 2022-08-27 DIAGNOSIS — E876 Hypokalemia: Secondary | ICD-10-CM | POA: Diagnosis present

## 2022-08-27 DIAGNOSIS — O9081 Anemia of the puerperium: Secondary | ICD-10-CM | POA: Diagnosis present

## 2022-08-27 DIAGNOSIS — K279 Peptic ulcer, site unspecified, unspecified as acute or chronic, without hemorrhage or perforation: Secondary | ICD-10-CM | POA: Diagnosis not present

## 2022-08-27 DIAGNOSIS — O8600 Infection of obstetric surgical wound, unspecified: Principal | ICD-10-CM | POA: Diagnosis present

## 2022-08-27 DIAGNOSIS — T8140XA Infection following a procedure, unspecified, initial encounter: Secondary | ICD-10-CM | POA: Diagnosis not present

## 2022-08-27 LAB — CBC WITH DIFFERENTIAL/PLATELET
Abs Immature Granulocytes: 0.04 10*3/uL (ref 0.00–0.07)
Basophils Absolute: 0.1 10*3/uL (ref 0.0–0.1)
Basophils Relative: 1 %
Eosinophils Absolute: 0.4 10*3/uL (ref 0.0–0.5)
Eosinophils Relative: 5 %
HCT: 26.3 % — ABNORMAL LOW (ref 36.0–46.0)
Hemoglobin: 7.5 g/dL — ABNORMAL LOW (ref 12.0–15.0)
Immature Granulocytes: 1 %
Lymphocytes Relative: 18 %
Lymphs Abs: 1.2 10*3/uL (ref 0.7–4.0)
MCH: 24.4 pg — ABNORMAL LOW (ref 26.0–34.0)
MCHC: 28.5 g/dL — ABNORMAL LOW (ref 30.0–36.0)
MCV: 85.7 fL (ref 80.0–100.0)
Monocytes Absolute: 0.9 10*3/uL (ref 0.1–1.0)
Monocytes Relative: 14 %
Neutro Abs: 4.1 10*3/uL (ref 1.7–7.7)
Neutrophils Relative %: 61 %
Platelets: 775 10*3/uL — ABNORMAL HIGH (ref 150–400)
RBC: 3.07 MIL/uL — ABNORMAL LOW (ref 3.87–5.11)
RDW: 23.1 % — ABNORMAL HIGH (ref 11.5–15.5)
WBC: 6.8 10*3/uL (ref 4.0–10.5)
nRBC: 0.4 % — ABNORMAL HIGH (ref 0.0–0.2)

## 2022-08-27 LAB — COMPREHENSIVE METABOLIC PANEL
ALT: 10 U/L (ref 0–44)
AST: 10 U/L — ABNORMAL LOW (ref 15–41)
Albumin: 2 g/dL — ABNORMAL LOW (ref 3.5–5.0)
Alkaline Phosphatase: 67 U/L (ref 38–126)
Anion gap: 9 (ref 5–15)
BUN: 11 mg/dL (ref 6–20)
CO2: 26 mmol/L (ref 22–32)
Calcium: 8.4 mg/dL — ABNORMAL LOW (ref 8.9–10.3)
Chloride: 104 mmol/L (ref 98–111)
Creatinine, Ser: 0.54 mg/dL (ref 0.44–1.00)
GFR, Estimated: >60 mL/min (ref >60)
Glucose, Bld: 85 mg/dL (ref 70–99)
Potassium: 3.2 mmol/L — ABNORMAL LOW (ref 3.5–5.1)
Sodium: 139 mmol/L (ref 135–145)
Total Bilirubin: 0.6 mg/dL (ref 0.3–1.2)
Total Protein: 6.6 g/dL (ref 6.5–8.1)

## 2022-08-27 LAB — TYPE AND SCREEN
ABO/RH(D): B POS
Antibody Screen: NEGATIVE

## 2022-08-27 MED ORDER — SODIUM CHLORIDE 0.9 % IV SOLN
3.0000 g | Freq: Four times a day (QID) | INTRAVENOUS | Status: DC
  Administered 2022-08-27 – 2022-08-29 (×9): 3 g via INTRAVENOUS
  Filled 2022-08-27 (×9): qty 8

## 2022-08-27 MED ORDER — LACTATED RINGERS IV SOLN: INTRAVENOUS | Status: DC

## 2022-08-27 MED ORDER — PRENATAL MULTIVITAMIN CH
1.0000 | ORAL_TABLET | Freq: Every day | ORAL | Status: DC
  Administered 2022-08-29 – 2022-08-31 (×3): 1 via ORAL
  Filled 2022-08-27 (×3): qty 1

## 2022-08-27 MED ORDER — POTASSIUM CHLORIDE 20 MEQ PO PACK
40.0000 meq | PACK | Freq: Once | ORAL | Status: AC
  Administered 2022-08-27: 40 meq via ORAL
  Filled 2022-08-27: qty 2

## 2022-08-27 MED ORDER — OXYCODONE HCL 5 MG PO TABS
10.0000 mg | ORAL_TABLET | ORAL | Status: DC | PRN
  Administered 2022-08-27 – 2022-08-31 (×21): 10 mg via ORAL
  Filled 2022-08-27 (×22): qty 2

## 2022-08-27 MED ORDER — MORPHINE SULFATE (PF) 4 MG/ML IV SOLN
1.0000 mg | INTRAVENOUS | Status: DC | PRN
  Administered 2022-08-29: 2 mg via INTRAVENOUS
  Administered 2022-08-29: 1 mg via INTRAVENOUS
  Filled 2022-08-27 (×2): qty 1

## 2022-08-27 MED ORDER — IOHEXOL 350 MG/ML SOLN
75.0000 mL | Freq: Once | INTRAVENOUS | Status: AC | PRN
  Administered 2022-08-27: 75 mL via INTRAVENOUS

## 2022-08-27 MED ORDER — ONDANSETRON HCL 4 MG PO TABS: 4.0000 mg | ORAL_TABLET | Freq: Four times a day (QID) | ORAL | Status: DC | PRN

## 2022-08-27 MED ORDER — VANCOMYCIN HCL IN DEXTROSE 1-5 GM/200ML-% IV SOLN
1000.0000 mg | Freq: Two times a day (BID) | INTRAVENOUS | Status: DC
  Administered 2022-08-27 – 2022-08-29 (×5): 1000 mg via INTRAVENOUS
  Filled 2022-08-27 (×5): qty 200

## 2022-08-27 MED ORDER — IBUPROFEN 600 MG PO TABS
600.0000 mg | ORAL_TABLET | Freq: Four times a day (QID) | ORAL | Status: DC | PRN
  Administered 2022-08-27 – 2022-08-29 (×5): 600 mg via ORAL
  Filled 2022-08-27 (×5): qty 1

## 2022-08-27 MED ORDER — IOHEXOL 9 MG/ML PO SOLN
500.0000 mL | ORAL | Status: AC
  Administered 2022-08-27 (×2): 500 mL via ORAL

## 2022-08-27 MED ORDER — FERROUS SULFATE 325 (65 FE) MG PO TABS
325.0000 mg | ORAL_TABLET | ORAL | Status: DC
  Administered 2022-08-30: 325 mg via ORAL
  Filled 2022-08-27 (×2): qty 1

## 2022-08-27 MED ORDER — ONDANSETRON HCL 4 MG/2ML IJ SOLN: 4.0000 mg | Freq: Four times a day (QID) | INTRAMUSCULAR | Status: DC | PRN

## 2022-08-27 MED ORDER — SENNOSIDES-DOCUSATE SODIUM 8.6-50 MG PO TABS: 1.0000 | ORAL_TABLET | Freq: Every evening | ORAL | Status: DC | PRN

## 2022-08-27 NOTE — Progress Notes (Signed)
    Faculty Practice OB/GYN Attending Note  Subjective:  Called to evaluate patient with incisional dehiscence and foul-smelling fluid drainage.  She is s/p cesarean section on 08/10/22, has known left sided wound separation and packing in place.   Drainage worsened today, and she now has opening on the right side.  She is on Keflex currently.    Objective:  Blood pressure 104/67, pulse 86, temperature 98.4 F (36.9 C), temperature source Oral, resp. rate 17, SpO2 100 %, not currently breastfeeding.  Gen: NAD HENT: Normocephalic, atraumatic Lungs: Normal respiratory effort Heart: Regular rate noted Cervix: Deferred Ext: 2+ DTRs, no edema, no cyanosis, negative Homan's sign Abdomen:  2 cm open region on the left edge of the incision and 1 cm opening on the right side. Foul smelling yellow-brown drainage noted saturating the packing on the left side, this was removed.  Entire subcutaneous region under skin closure is full of purulent material and able to pass cotton swab freely from one edge of the incision to the other, samples taken for wound culture. The middle skin closure is the thin "roof" over an otherwise open subcutaneous area for the entire incision.   No fascial penetration able to be done with cotton swabs, but this was tender for patient.  Entire incisional area is tender and  very indurated. Diffuse mild erythema noted.   Incision was flushed with many syringes containing sterile normal saline.  Incision left open for now, ABD pad placed over it for comfort.  Assessment & Plan:  37 y.o. [id number] with cesarean section wound subcutaneous abscess, that is draining..  Infection involves entire subcutaneous space.  No evidence of intraperitoneal spread on exam, but will obtain imaging to evaluate for this possibility. Patient has been here, home and traveling to [facility] to see her baby in the NICU there, worried about MRSA. -  CT scan of abdomen/pelvis ordered -  Vancomycin ordered. Pharmacy  consulted to get appropriate anaerobic coverage - CBC with differential ordered - Patient was told that she may need admission for IV antibiotics +/- further incisional debridement/surgical procedures pending results of CT scan - Continue close monitoring for now.  Jaynie Collins, MD, FACOG Obstetrician & Gynecologist, [facility] [employer], [facility]

## 2022-08-27 NOTE — MAU Note (Addendum)
.  Monica Green is a 37 y.o. at Unknown here in MAU reporting: she has been going for incision checks and packing of her left side, but reports the right side has now opened and is draining pus/blood.  Reports she is currently taking antibiotics. Endorses pain at site.  Last took her percocet at 0500    Pain score: 8 Vitals:   08/27/22 1206  BP: 104/67  Pulse: 86  Resp: 17  Temp: 98.4 F (36.9 C)  SpO2: 100%      Lab orders placed from triage:

## 2022-08-27 NOTE — Progress Notes (Signed)
Monica Green is a 37 y.o. female admitted on 08/27/2022 11:45 AM  with a  c section wound infection . Pharmacy has been consulted for vancomycin dosing.  Plan: Vancomycin 1000mg  IV Q 12 hrs. Goal AUC 400-550. Expected AUC: 542 SCr used: 0.75  (used 0.8 for calculation)  Pt's weight 62.3 kg  Allergies: Patient has no known allergies.   Antimicrobials this admission: unasyn 3gm IV q 6h   5/25 >>    Dose Adjustments this admission: N/A  Microbiology results: 5/25 wound Cx pending  Pharmacy will follow and monitor levels. Please obtain a Scr qod while on vancomycin.  Thank you for allowing pharmacy to be a part of this patient's care.  Sherrilyn Rist 08/27/2022 2:12 PM

## 2022-08-27 NOTE — MAU Provider Note (Signed)
History     CSN: [id number]  Arrival date and time: 08/27/22 1145   Event Date/Time   First Provider Initiated Contact with Patient 08/27/22 1233      Chief Complaint  Patient presents with   Post-op Problem   HPI Monica Green is a 37 y.o. [id number] who presents to MAU with concern for new onset dehiscence and foul-smelling drainage from the RIGHT side of her cesarean incision. She is s/p primary cesarean with twin gestation on 08/10/2022. Patient endorses recurrent concerns regarding the LEFT side of her incision. She is s/p wound packing and was most recently seen for at [facility] on Friday 08/25/2022. She is compliant with the Keflex 500 mg BID she was prescribed.   Concerns about the right side of her incision are new onset yesterday 08/26/2022.  She denies fever, abdominal tenderness, foul-smelling vaginal discharge, or recent illness.  Patient reports she has one twin in [facility] NICU and her daughter is at [facility] NICU  OB History     Gravida  2   Para  2   Term  1   Preterm  1   AB      Living  3      SAB      IAB      Ectopic      Multiple  1   Live Births  3           Past Medical History:  Diagnosis Date   Anemia    Colitis    Eclampsia 12/04/2020   IDA (iron deficiency anemia)    Normal postpartum course 11/26/2020   Pre-eclampsia    Pre-eclampsia in third trimester 11/23/2020   SVD (8/24) 11/24/2020   Term newborn delivered vaginally, current hospitalization 11/24/2020   Thrombocytosis 03/03/2020   Ulcerative colitis (HCC)     Past Surgical History:  Procedure Laterality Date   CESAREAN SECTION     CESAREAN SECTION MULTI-GESTATIONAL N/A 08/10/2022   Procedure: CESAREAN SECTION MULTI-GESTATIONAL;  Surgeon: Jackie Plum, MD;  Location: [facility] LD ORS;  Service: Obstetrics;  Laterality: N/A;   FLEXIBLE SIGMOIDOSCOPY N/A 03/23/2015   Procedure: FLEXIBLE SIGMOIDOSCOPY;  Surgeon: Carman Ching, MD;  Location: [facility] ENDOSCOPY;  Service:  Endoscopy;  Laterality: N/A;    Family History  Problem Relation Age of Onset   Hypertension Father    Cancer Father    Colon cancer Father    Asthma Sister    Asthma Brother    Hypertension Paternal Grandfather    Diabetes Neg Hx    Heart disease Neg Hx     Social History   Tobacco Use   Smoking status: Never   Smokeless tobacco: Never  Vaping Use   Vaping Use: Never used  Substance Use Topics   Alcohol use: Not Currently    Comment: Social   Drug use: No    Allergies: No Known Allergies  Medications Prior to Admission  Medication Sig Dispense Refill Last Dose   Cyanocobalamin (B-12) 1000 MCG SUBL Place 1,000 mcg under the tongue daily. 30 tablet 11    ibuprofen (ADVIL) 600 MG tablet Take 1 tablet (600 mg total) by mouth every 6 (six) hours. 30 tablet 0    iron polysaccharides (NIFEREX) 150 MG capsule Take 1 capsule (150 mg total) by mouth daily. 30 capsule 5    magnesium oxide (MAG-OX) 400 (240 Mg) MG tablet Take 1 tablet (400 mg total) by mouth daily. 30 tablet 0    potassium chloride SA (KLOR-CON M)  20 MEQ tablet Take 1 tablet (20 mEq total) by mouth 2 (two) times daily. 14 tablet 0    Prenatal Vit-Fe Fumarate-FA (PRENATAL MULTIVITAMIN) TABS tablet Take 1 tablet by mouth daily at 12 noon. (Patient taking differently: Take 1 tablet by mouth daily.) 60 tablet 3     Review of Systems  Skin:  Positive for wound.       New onst foul-smelling leaking from RIGHT margin of cesarean incision  All other systems reviewed and are negative.  Physical Exam   Blood pressure 104/67, pulse 86, temperature 98.4 F (36.9 C), temperature source Oral, resp. rate 17, SpO2 100 %, not currently breastfeeding.  Physical Exam Vitals and nursing note reviewed.  Constitutional:      General: She is not in acute distress.    Appearance: Normal appearance. She is not ill-appearing.  Cardiovascular:     Rate and Rhythm: Normal rate.     Pulses: Normal pulses.  Pulmonary:     Effort:  Pulmonary effort is normal.  Abdominal:     General: Abdomen is flat.     Tenderness: There is abdominal tenderness.  Skin:    Capillary Refill: Capillary refill takes less than 2 seconds.     Comments: Wound packing to left side of LTCS incision  Open skin at right margin of incision. Foul-smelling pale brown discharge spontaneously leaking from incision  Neurological:     Mental Status: She is alert and oriented to person, place, and time.  Psychiatric:        Mood and Affect: Mood normal.        Behavior: Behavior normal.        Thought Content: Thought content normal.        Judgment: Judgment normal.     MAU Course  Procedures  MDM Orders Placed This Encounter  Procedures   Aerobic/Anaerobic Culture w Gram Stain (surgical/deep wound)   CT ABDOMEN PELVIS W CONTRAST   CBC with Differential/Platelet   Meds ordered this encounter  Medications   vancomycin (VANCOCIN) IVPB 1000 mg/200 mL premix    Order Specific Question:   Indication:    Answer:   Wound Infection   Assessment and Plan  --37 y.o. [id number] s/p cesarean birth on 08/10/2022 --Post-op wound infection --Dr. Macon Large at bedside for assessment --See Dr. Mont Dutton separate note regarding care coordination with Dr. Connye Burkitt --Per Dr. Connye Burkitt, admit to [facility]  Calvert Cantor, MSA, MSN, CNM 08/27/2022, 2:39 PM

## 2022-08-27 NOTE — Progress Notes (Signed)
Reviewed CT A/P as documented below with Dr. Jayme Cloud - confirmed absence of any fascial dehiscence. I discussed the findings with the patient. I recommend opening of incision with wash-out/debridement with transition to wet-to-dry dressings and healing by secondary intention tomorrow to allow for IV antibiotics time to work. Counseled patient on recommendation for wound vacuum after a few days of wet-to- dry dressings as well. Will consult wound care team tomorrow after procedure. Patient is in agreement.  Consents for procedure obtained, signed, and witnessed. We discussed risks of procedure which include but are not limited to infection, injury to surrounding structures, bleeding, and risk of poor wound healing. Patient was consented for blood products.  The patient is aware that bleeding may result in the need for a blood transfusion which includes risk of transmission of HIV (1:2 million), Hepatitis C (1:2 million), and Hepatitis B (1:200 thousand) and transfusion reaction.  Patient voiced understanding of the above risks as well as understanding of indications for blood transfusion.  Main OR called to add-on case but due to the holiday weekend, they recommend calling in the AM to add on the case for the day. Patient to be made NPO after midnight with maintenance IVF.   CT A/P (08/27/22): Narrative & Impression  CLINICAL DATA:  Intra-abdominal abscess, postoperative wound dehiscence, purulent drainage, C-section 2 weeks ago   EXAM: CT ABDOMEN AND PELVIS WITH CONTRAST   TECHNIQUE: Multidetector CT imaging of the abdomen and pelvis was performed using the standard protocol following bolus administration of intravenous contrast.   RADIATION DOSE REDUCTION: This exam was performed according to the departmental dose-optimization program which includes automated exposure control, adjustment of the mA and/or kV according to patient size and/or use of iterative reconstruction technique.    CONTRAST:  75mL OMNIPAQUE IOHEXOL 350 MG/ML SOLN   COMPARISON:  03/21/2015   FINDINGS: Lower chest: Trace bilateral pleural effusions.   Hepatobiliary: No solid liver abnormality is seen. No gallstones, gallbladder wall thickening, or biliary dilatation.   Pancreas: Unremarkable. No pancreatic ductal dilatation or surrounding inflammatory changes.   Spleen: Normal in size without significant abnormality.   Adrenals/Urinary Tract: Adrenal glands are unremarkable. Kidneys are normal, without renal calculi, solid lesion, or hydronephrosis. Bladder is unremarkable.   Stomach/Bowel: Stomach is within normal limits. Appendix appears normal. Diffuse inflammatory wall thickening involving the entirety of the colon to the rectum, with a featureless appearance and fibrofatty mural stratification. Appearance is very similar to prior examination dated 2016.   Vascular/Lymphatic: No significant vascular findings are present. No enlarged abdominal or pelvic lymph nodes.   Reproductive: No mass or other significant abnormality. Expected appearance of a C-section wound of the lower anterior uterine segment (series 7, image 57).   Other: Low transverse incision with scattered internal air loculations (series 3, image 73). No discrete fluid collection. No ascites.   Musculoskeletal: No acute or significant osseous findings.   IMPRESSION: 1. Low transverse incision with scattered internal air loculations. No discrete fluid collection within the abdominal wall. 2. Expected appearance of a C-section wound of the lower anterior uterine segment. 3. Diffuse inflammatory wall thickening involving the entirety of the colon to the rectum, with a featureless appearance and fibrofatty mural stratification. Appearance is very similar to prior examination dated 1,016 and highly suggestive of ulcerative colitis with evidence of active inflammation. 4. Trace bilateral pleural effusions.      Electronically Signed   By: Jearld Lesch M.D.   On: 08/27/2022 19:00   Steva Ready, DO

## 2022-08-27 NOTE — Lactation Note (Signed)
This note was copied from a baby's chart.  NICU Lactation Consultation Note  Patient Name: Shelby Nila [id number] Date: 08/27/2022 Age:38 wk.o.  Reason for consult: Weekly NICU follow-up; NICU baby; Other (Comment); Multiple gestation; Late-preterm 34-36.6wks; Infant < 6lbs (Readmit to OBSC)  SUBJECTIVE Visited with family of 38 86/46 weeks old [facility] NICU female; Ms. Mcfarlain is a P2 and re-admitted to [facility] due to infection of her C/S incision. She reports she's no longer pumping, baby girl was transferred to [facility] and things just became too hectic for her, she stopped pumping about two weeks ago but voice she still feel that her breast are slightly "full". No S/S of engorgement at this time, reviewed engorgement prevention and treatment, she wishes to D/C pumping all together, she might need to have another surgery tomorrow. No other support person at this time. All questions and concerns answered, family to contact [facility] services PRN.  OBJECTIVE Infant data: Mother's Current Feeding Choice: -- (donor milk)  Infant feeding assessment Scale for Readiness: 2 Scale for Quality: 3   Maternal data: G2P1103  C-Section, Low Transverse WIC Program: Yes WIC Referral Sent?: Yes What county?: [place] Pump: Personal (FOB purchased a pump and Mom unsure of brand)  ASSESSMENT Infant: Feeding Status: Scheduled 8-11-2-5  Maternal: No data recorded INTERVENTIONS/PLAN Interventions: Interventions: Education Discharge Education: Engorgement and breast care  Plan: Consult Status: Complete   Blayklee Mable Venetia Constable 08/27/2022, 6:23 PM

## 2022-08-27 NOTE — H&P (Signed)
CHELCE NOTCH is an 37 y.o. 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  Patient re-presented today for drainage of pus/blood at her pfannenstiel incision site. See review of her history as documented below. In MAU, on exam by Dr. Macon Large, there was incisional dehiscence and foul-smelling fluid drainage. She has been taking Keflex as prescribed. Per Dr. Macon Large, "2 cm open region on the left edge of the incision and 1 cm opening on the right side. Foul smelling yellow-brown drainage noted saturating the packing on the left side, this was removed. Entire subcutaneous region under skin closure is full of purulent material and able to pass cotton swab freely from one edge of the incision to the other, samples taken for wound culture. The middle skin closure is the thin "roof" over an otherwise open subcutaneous area for the entire incision. No fascial penetration able to be done with cotton swabs, but this was tender for patient. Entire incisional area is tender and very indurated. Diffuse mild erythema noted. Incision was flushed with many syringes containing sterile normal saline. Incision left open for now, ABD pad placed over it for comfort."  Patient reports compliance with taking Keflex 500mg  BID and her wound follow-up at [facility]. No fevers/chills. Reports only pain at the incision site. Lochia is minimal. She is no longer pumping. One infant is in NICU at [facility] (girl) and the other (boy) is in the NICU here at [facility].  Patient has been started on Vancomycin and Unasyn per pharmacy consult. STAT CT A/P ordered to ensure no evidence of fascial dehiscence although this does not seem to be the case on exam.   In Review: On 5/22, patient presented to MAU for incisional pain and drainage. She was being followed in the office at [facility] for wound  packing and it was covered two days prior to initial presentation to MAU. She was started on Keflex and scheduled for wound packing. She presented due to significant pain requiring Oxycodone. Initially, an approximately 3cm wound dehiscence was noted just central to the left corner of the pfannenstiel incision with a depth of at least 2cm. No erythema or warmth was noted at that time. Patient was re-packed and it was noted that the incision was probed 9-10cm medially and about 3-4cm superiorly and 2cm laterally. Packing was replaced with about 6 inches of a bottle of 5 yards and 1/2 inch gauze. Patient was discharged home in stable condition without any active signs of infection.  Of note, prior to delivery on 5/9, patient was admitted on 5/6 for preterm contractions and and it monitoring due to non-reassuring FHT of Twin B. Patient's pregnancy was otherwise complicated by AMA (37), chronic iron deficiency anemia anemia, ulcerative colitis, history of preeclampsia and eclampsia, and Vitamin D deficiency. Her delivery was complicated by a postpartum hemorrhage (QBL 1621cc). Patient was discharged home on 5/11 in stable condition with honeycomb dressing place.  Patient Active Problem List   Diagnosis Date Noted   Preterm delivery 08/12/2022   Postpartum hemorrhage 08/12/2022   Normal postpartum course 08/12/2022   Status post primary low transverse cesarean section 08/10/2022   Fetal heart rate decelerations affecting management of mother 08/07/2022   Preterm contractions 08/07/2022   Dichorionic diamniotic twin pregnancy, antepartum 07/24/2022   Abnormal maternal serum screening test 07/24/2022   AMA (advanced maternal age) primigravida 35+, third trimester  11/23/2020   Intrauterine growth restriction affecting antepartum care of mother in third trimester, fetus 2 11/23/2020   IDA (iron deficiency anemia) 07/29/2020   Ulcerative colitis (HCC) 04/08/2015    MEDICAL/FAMILY/SOCIAL HX: No LMP  recorded.    Past Medical History:  Diagnosis Date   Anemia    Colitis    Eclampsia 12/04/2020   IDA (iron deficiency anemia)    Normal postpartum course 11/26/2020   Pre-eclampsia    Pre-eclampsia in third trimester 11/23/2020   SVD (8/24) 11/24/2020   Term newborn delivered vaginally, current hospitalization 11/24/2020   Thrombocytosis 03/03/2020   Ulcerative colitis (HCC)     Past Surgical History:  Procedure Laterality Date   CESAREAN SECTION     CESAREAN SECTION MULTI-GESTATIONAL N/A 08/10/2022   Procedure: CESAREAN SECTION MULTI-GESTATIONAL;  Surgeon: Jackie Plum, MD;  Location: [facility] LD ORS;  Service: Obstetrics;  Laterality: N/A;   FLEXIBLE SIGMOIDOSCOPY N/A 03/23/2015   Procedure: FLEXIBLE SIGMOIDOSCOPY;  Surgeon: Carman Ching, MD;  Location: [facility] ENDOSCOPY;  Service: Endoscopy;  Laterality: N/A;    Family History  Problem Relation Age of Onset   Hypertension Father    Cancer Father    Colon cancer Father    Asthma Sister    Asthma Brother    Hypertension Paternal Grandfather    Diabetes Neg Hx    Heart disease Neg Hx     Social History:  reports that she has never smoked. She has never used smokeless tobacco. She reports that she does not currently use alcohol. She reports that she does not use drugs.  ALLERGIES/MEDS:  Allergies: No Known Allergies  Medications Prior to Admission  Medication Sig Dispense Refill Last Dose   Cyanocobalamin (B-12) 1000 MCG SUBL Place 1,000 mcg under the tongue daily. 30 tablet 11    ibuprofen (ADVIL) 600 MG tablet Take 1 tablet (600 mg total) by mouth every 6 (six) hours. 30 tablet 0    iron polysaccharides (NIFEREX) 150 MG capsule Take 1 capsule (150 mg total) by mouth daily. 30 capsule 5    magnesium oxide (MAG-OX) 400 (240 Mg) MG tablet Take 1 tablet (400 mg total) by mouth daily. 30 tablet 0    potassium chloride SA (KLOR-CON M) 20 MEQ tablet Take 1 tablet (20 mEq total) by mouth 2 (two) times daily. 14 tablet 0     Prenatal Vit-Fe Fumarate-FA (PRENATAL MULTIVITAMIN) TABS tablet Take 1 tablet by mouth daily at 12 noon. (Patient taking differently: Take 1 tablet by mouth daily.) 60 tablet 3      Review of Systems  Constitutional: Negative.   HENT: Negative.    Eyes: Negative.   Respiratory: Negative.    Cardiovascular: Negative.   Gastrointestinal:  Positive for abdominal pain.  Genitourinary: Negative.   Musculoskeletal: Negative.   Skin: Negative.   Neurological: Negative.   Endo/Heme/Allergies: Negative.   Psychiatric/Behavioral: Negative.      Blood pressure 104/67, pulse 86, temperature 98.4 F (36.9 C), temperature source Oral, resp. rate 17, SpO2 100 %, not currently breastfeeding. Gen:  NAD, pleasant and cooperative Cardio:  RRR Pulm:  CTAB, no wheezes/rales/rhonchi Abd:  Soft, non-distended, non-tender throughout, no rebound/guarding Incision: A ~2-3cm opening on the left aspect of the pfannenstiel incision and a ~1-2cm opening on the right side, drainage noted on pad of malodorous yellow to brown exudate, due to previous exam, the incision was not probed, tenderness surrounding incision site is noted with mild erythema Ext:  No bilateral LE edema, no bilateral calf tenderness  Results for orders placed or performed during the hospital encounter of 08/27/22 (from the past 24 hour(s))  CBC with Differential/Platelet     Status: Abnormal   Collection Time: 08/27/22 12:41 PM  Result Value Ref Range   WBC 6.8 4.0 - 10.5 K/uL   RBC 3.07 (L) 3.87 - 5.11 MIL/uL   Hemoglobin 7.5 (L) 12.0 - 15.0 g/dL   HCT 16.1 (L) 09.6 - 04.5 %   MCV 85.7 80.0 - 100.0 fL   MCH 24.4 (L) 26.0 - 34.0 pg   MCHC 28.5 (L) 30.0 - 36.0 g/dL   RDW 40.9 (H) 81.1 - 91.4 %   Platelets 775 (H) 150 - 400 K/uL   nRBC 0.4 (H) 0.0 - 0.2 %   Neutrophils Relative % 61 %   Neutro Abs 4.1 1.7 - 7.7 K/uL   Lymphocytes Relative 18 %   Lymphs Abs 1.2 0.7 - 4.0 K/uL   Monocytes Relative 14 %   Monocytes Absolute 0.9 0.1  - 1.0 K/uL   Eosinophils Relative 5 %   Eosinophils Absolute 0.4 0.0 - 0.5 K/uL   Basophils Relative 1 %   Basophils Absolute 0.1 0.0 - 0.1 K/uL   Immature Granulocytes 1 %   Abs Immature Granulocytes 0.04 0.00 - 0.07 K/uL  Type and screen [facility]     Status: None   Collection Time: 08/27/22  3:27 PM  Result Value Ref Range   ABO/RH(D) B POS    Antibody Screen NEG    Sample Expiration      08/30/2022,2359 Performed at [facility], [address], [place], [place] [address]     No results found.   ASSESSMENT/PLAN: ZAKERA FIDDLER is a 37 y.o. 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  - Wound cultures obtained, will follow - Vital signs within normal limits, afebrile - WBC count normal range, CMP in process - S/p drainage of purulent material in MAU with irrigation - No apparent fascial dehiscence on exam in MAU with probing - CT A/P ordered for confirmation - Antibiotics: IV Vancomycin 1000mg  q12h and IV Unasyn 3g q6h per pharmacy consult - Pain control: Roxicodone, Ibuprofen, Morphine PRN - Diet: NPO - IVF: LR at 125cc/hour - We discussed plan of care and likelihood of needing opening of wound, wound washout/debridement, and healing by secondary intention. Discussed possibility of wound vacuum. Further management pending CT A/P.  Steva Ready, DO

## 2022-08-28 ENCOUNTER — Encounter (HOSPITAL_COMMUNITY): Payer: Self-pay | Admitting: Obstetrics and Gynecology

## 2022-08-28 ENCOUNTER — Inpatient Hospital Stay (HOSPITAL_COMMUNITY): Payer: Medicaid Other | Admitting: Anesthesiology

## 2022-08-28 ENCOUNTER — Other Ambulatory Visit: Payer: Self-pay

## 2022-08-28 ENCOUNTER — Encounter (HOSPITAL_COMMUNITY)
Admission: AD | Disposition: A | Discharge status: Home / Self Care | Payer: Self-pay | Source: Home / Self Care | Attending: Obstetrics and Gynecology

## 2022-08-28 DIAGNOSIS — T8140XA Infection following a procedure, unspecified, initial encounter: Secondary | ICD-10-CM

## 2022-08-28 DIAGNOSIS — I10 Essential (primary) hypertension: Secondary | ICD-10-CM

## 2022-08-28 DIAGNOSIS — K279 Peptic ulcer, site unspecified, unspecified as acute or chronic, without hemorrhage or perforation: Secondary | ICD-10-CM

## 2022-08-28 HISTORY — PX: IRRIGATION AND DEBRIDEMENT HEMATOMA: SHX5254

## 2022-08-28 LAB — MRSA NEXT GEN BY PCR, NASAL: MRSA by PCR Next Gen: NOT DETECTED

## 2022-08-28 SURGERY — IRRIGATION AND DEBRIDEMENT HEMATOMA
Anesthesia: General | Site: Abdomen

## 2022-08-28 MED ORDER — ACETAMINOPHEN 160 MG/5ML PO SOLN: 325.0000 mg | ORAL | Status: DC | PRN

## 2022-08-28 MED ORDER — MIDAZOLAM HCL 2 MG/2ML IJ SOLN
INTRAMUSCULAR | Status: AC
  Filled 2022-08-28: qty 2

## 2022-08-28 MED ORDER — MENTHOL 3 MG MT LOZG
1.0000 | LOZENGE | OROMUCOSAL | Status: DC | PRN
  Administered 2022-08-28 – 2022-08-30 (×2): 3 mg via ORAL
  Filled 2022-08-28 (×2): qty 9

## 2022-08-28 MED ORDER — ROCURONIUM BROMIDE 100 MG/10ML IV SOLN
INTRAVENOUS | Status: DC | PRN
  Administered 2022-08-28: 15 mg via INTRAVENOUS

## 2022-08-28 MED ORDER — PHENYLEPHRINE 80 MCG/ML (10ML) SYRINGE FOR IV PUSH (FOR BLOOD PRESSURE SUPPORT)
PREFILLED_SYRINGE | INTRAVENOUS | Status: DC | PRN
  Administered 2022-08-28: 40 ug via INTRAVENOUS

## 2022-08-28 MED ORDER — LIDOCAINE 2% (20 MG/ML) 5 ML SYRINGE
INTRAMUSCULAR | Status: DC | PRN
  Administered 2022-08-28: 70 mg via INTRAVENOUS

## 2022-08-28 MED ORDER — ACETAMINOPHEN 325 MG PO TABS: 325.0000 mg | ORAL_TABLET | ORAL | Status: DC | PRN

## 2022-08-28 MED ORDER — FENTANYL CITRATE (PF) 250 MCG/5ML IJ SOLN
INTRAMUSCULAR | Status: AC
  Filled 2022-08-28: qty 5

## 2022-08-28 MED ORDER — ONDANSETRON HCL 4 MG/2ML IJ SOLN
INTRAMUSCULAR | Status: DC | PRN
  Administered 2022-08-28: 4 mg via INTRAVENOUS

## 2022-08-28 MED ORDER — MIDAZOLAM HCL 2 MG/2ML IJ SOLN
INTRAMUSCULAR | Status: DC | PRN
  Administered 2022-08-28: 2 mg via INTRAVENOUS

## 2022-08-28 MED ORDER — 0.9 % SODIUM CHLORIDE (POUR BTL) OPTIME
TOPICAL | Status: DC | PRN
  Administered 2022-08-28: 1000 mL

## 2022-08-28 MED ORDER — ACETAMINOPHEN 500 MG PO TABS
1000.0000 mg | ORAL_TABLET | Freq: Four times a day (QID) | ORAL | Status: DC | PRN
  Administered 2022-08-28 – 2022-08-29 (×4): 1000 mg via ORAL
  Filled 2022-08-28 (×5): qty 2

## 2022-08-28 MED ORDER — ACETAMINOPHEN 10 MG/ML IV SOLN: 1000.0000 mg | Freq: Once | INTRAVENOUS | Status: DC | PRN

## 2022-08-28 MED ORDER — PROPOFOL 10 MG/ML IV BOLUS
INTRAVENOUS | Status: DC | PRN
  Administered 2022-08-28: 150 mg via INTRAVENOUS

## 2022-08-28 MED ORDER — OXYCODONE HCL 5 MG PO TABS: 5.0000 mg | ORAL_TABLET | Freq: Once | ORAL | Status: DC | PRN

## 2022-08-28 MED ORDER — AMISULPRIDE (ANTIEMETIC) 5 MG/2ML IV SOLN: 10.0000 mg | Freq: Once | INTRAVENOUS | Status: DC | PRN

## 2022-08-28 MED ORDER — PHENYLEPHRINE 80 MCG/ML (10ML) SYRINGE FOR IV PUSH (FOR BLOOD PRESSURE SUPPORT)
PREFILLED_SYRINGE | INTRAVENOUS | Status: AC
  Filled 2022-08-28: qty 10

## 2022-08-28 MED ORDER — ESMOLOL HCL 100 MG/10ML IV SOLN
INTRAVENOUS | Status: DC | PRN
  Administered 2022-08-28 (×2): 10 mg via INTRAVENOUS

## 2022-08-28 MED ORDER — FENTANYL CITRATE (PF) 250 MCG/5ML IJ SOLN
INTRAMUSCULAR | Status: DC | PRN
  Administered 2022-08-28: 25 ug via INTRAVENOUS
  Administered 2022-08-28: 75 ug via INTRAVENOUS
  Administered 2022-08-28: 25 ug via INTRAVENOUS

## 2022-08-28 MED ORDER — SUGAMMADEX SODIUM 200 MG/2ML IV SOLN
INTRAVENOUS | Status: DC | PRN
  Administered 2022-08-28 (×2): 100 mg via INTRAVENOUS

## 2022-08-28 MED ORDER — OXYCODONE HCL 5 MG/5ML PO SOLN: 5.0000 mg | Freq: Once | ORAL | Status: DC | PRN

## 2022-08-28 MED ORDER — PROPOFOL 500 MG/50ML IV EMUL
INTRAVENOUS | Status: DC | PRN
  Administered 2022-08-28: 25 ug/kg/min via INTRAVENOUS

## 2022-08-28 MED ORDER — DEXAMETHASONE SODIUM PHOSPHATE 10 MG/ML IJ SOLN
INTRAMUSCULAR | Status: AC
  Filled 2022-08-28: qty 1

## 2022-08-28 MED ORDER — PROPOFOL 10 MG/ML IV BOLUS
INTRAVENOUS | Status: AC
  Filled 2022-08-28: qty 20

## 2022-08-28 MED ORDER — ONDANSETRON HCL 4 MG/2ML IJ SOLN
INTRAMUSCULAR | Status: AC
  Filled 2022-08-28: qty 2

## 2022-08-28 MED ORDER — SIMETHICONE 80 MG PO CHEW: 80.0000 mg | CHEWABLE_TABLET | Freq: Four times a day (QID) | ORAL | Status: DC | PRN

## 2022-08-28 MED ORDER — DEXAMETHASONE SODIUM PHOSPHATE 10 MG/ML IJ SOLN
INTRAMUSCULAR | Status: DC | PRN
  Administered 2022-08-28: 10 mg via INTRAVENOUS

## 2022-08-28 MED ORDER — FENTANYL CITRATE (PF) 100 MCG/2ML IJ SOLN: 25.0000 ug | INTRAMUSCULAR | Status: DC | PRN

## 2022-08-28 MED ORDER — PROMETHAZINE HCL 25 MG/ML IJ SOLN: 6.2500 mg | INTRAMUSCULAR | Status: DC | PRN

## 2022-08-28 MED ORDER — SUCCINYLCHOLINE CHLORIDE 200 MG/10ML IV SOSY
PREFILLED_SYRINGE | INTRAVENOUS | Status: DC | PRN
  Administered 2022-08-28: 100 mg via INTRAVENOUS

## 2022-08-28 SURGICAL SUPPLY — 24 items
BLADE SURG 10 STRL SS (BLADE) IMPLANT
BLADE SURG 15 STRL LF DISP TIS (BLADE) ×1 IMPLANT
BLADE SURG 15 STRL SS (BLADE) ×1
BNDG GAUZE DERMACEA FLUFF 4 (GAUZE/BANDAGES/DRESSINGS) IMPLANT
BNDG GZE DERMACEA 4 6PLY (GAUZE/BANDAGES/DRESSINGS) ×1
DRAPE LAPAROTOMY TRNSV 102X78 (DRAPES) IMPLANT
ELECT REM PT RETURN 9FT ADLT (ELECTROSURGICAL) ×1
ELECTRODE REM PT RTRN 9FT ADLT (ELECTROSURGICAL) IMPLANT
GAUZE 4X4 16PLY ~~LOC~~+RFID DBL (SPONGE) IMPLANT
GAUZE PAD ABD 8X10 STRL (GAUZE/BANDAGES/DRESSINGS) IMPLANT
GLOVE BIO SURGEON STRL SZ 6.5 (GLOVE) ×2 IMPLANT
GLOVE BIOGEL PI IND STRL 6.5 (GLOVE) ×1 IMPLANT
GLOVE BIOGEL PI IND STRL 7.0 (GLOVE) ×1 IMPLANT
GOWN STRL REUS W/ TWL LRG LVL3 (GOWN DISPOSABLE) ×2 IMPLANT
GOWN STRL REUS W/TWL LRG LVL3 (GOWN DISPOSABLE) ×2
NS IRRIG 1000ML POUR BTL (IV SOLUTION) ×1 IMPLANT
PACK VAGINAL MINOR WOMEN LF (CUSTOM PROCEDURE TRAY) ×1 IMPLANT
PENCIL BUTTON HOLSTER BLD 10FT (ELECTRODE) ×1 IMPLANT
SPONGE T-LAP 18X18 ~~LOC~~+RFID (SPONGE) IMPLANT
SYR BULB IRRIG 60ML STRL (SYRINGE) IMPLANT
TAPE CLOTH 3X10 TAN LF (GAUZE/BANDAGES/DRESSINGS) IMPLANT
TOWEL GREEN STERILE FF (TOWEL DISPOSABLE) ×2 IMPLANT
TUBE CONNECTING 12X1/4 (SUCTIONS) ×1 IMPLANT
YANKAUER SUCT BULB TIP NO VENT (SUCTIONS) ×1 IMPLANT

## 2022-08-28 NOTE — Progress Notes (Signed)
Patient seen at bedside. She is ready for procedure this AM. All questions invited and answered. Vital signs reviewed and are stable.  Steva Ready, DO

## 2022-08-28 NOTE — Transfer of Care (Signed)
Immediate Anesthesia Transfer of Care Note  Patient: Monica Green  Procedure(s) Performed: INCISIONAL WOUND IRRIGATION AND DEBRIDEMENT, AND PACKING (Abdomen)  Patient Location: PACU  Anesthesia Type:General  Level of Consciousness: sedated and drowsy  Airway & Oxygen Therapy: Patient Spontanous Breathing and Patient connected to face mask oxygen  Post-op Assessment: Report given to RN and Post -op Vital signs reviewed and stable  Post vital signs: Reviewed and stable  Last Vitals:  Vitals Value Taken Time  BP 109/66 08/28/22 1045  Temp    Pulse 104 08/28/22 1049  Resp 21 08/28/22 1049  SpO2 92 % 08/28/22 1049  Vitals shown include unvalidated device data.  Last Pain:  Vitals:   08/28/22 0830  TempSrc: Oral  PainSc:       Patients Stated Pain Goal: 3 (08/28/22 0118)  Complications: No notable events documented.

## 2022-08-28 NOTE — Op Note (Signed)
Pre Op Dx:   Post-operative wound infection  Post Op Dx:   Same as pre-operative diagnoses  Procedure:   1. Opening of pfannenstiel incision 2. Wound wash out with debridement 3. Wound packing (wet-to-dry dressing)   Surgeon:  Dr. Steva Ready Assistants:  None Anesthesia:  General   EBL:  10cc  IVF:  200cc UOP:  Voided prior to arrival to OR  Drains:  None Specimen removed:  Minimal areas of devitalized tissue and portion of plain gut suture Device(s) implanted: None Case Type:  Clean-contaminated Findings:  Pfannenstiel incision with a ~2cm opening on the right and ~3cm opening on the left with a ~1-2cm separation forming in the midportion of the incision. Subcutaneous tissue with yellow to brown exudate. Tissue quality overall appeared healthy with only two small areas of devitalized tissue which were excised sharply. Induration surrounding the incision noted as well. The fascial layer was noted to be intact in its entirety on palpation - no evidence of fascial dehiscence. Complications: None Indications:  37 y.o. G2P1103 s/p LTCS of Di/Di twins on 5/9 with post-operative wound infection on IV antibiotics.  Description of each procedure:  After informed consent was obtained, patient was taken to the operating room in the dorsal supine position. After general anesthesia was administered, patient was prepped and draped in the usual sterile fashion. The patient's abdomen was prepped with betadine. A pre-operative time-out was performed. The findings were noted as documented above. The pfannenstiel incision was opened sharply and plain gut suture in the subcutaneous tissue was removed. Bovie electrosurgery was used to achieve hemostasis of a bleeding vessel in the subcutaneous tissue on the left. The wound was copiously irrigated. Two small areas of devitalized tissue were removed sharply. The fascia was palpated to be intact in its entirety. Copious irrigation of the wound was performed  again. A Kerlix was lightly soaked in normal saline and the wound was packed. Adequate hemostasis was noted. The patient was awakened and extubated in the OR having appeared to tolerate the procedure well.  All sponge, needle, and instrument counts were correct.  Disposition:  PACU  Comments: Consultation with the wound team has been placed to follow.  Steva Ready, DO

## 2022-08-28 NOTE — Interval H&P Note (Signed)
History and Physical Interval Note:  08/28/2022 8:31 AM  Monica Green  has presented today for surgery, with the diagnosis of WOUND INFECTION.  The various methods of treatment have been discussed with the patient and family. After consideration of risks, benefits and other options for treatment, the patient has consented to  Procedure(s): INCISIONAL WOUND IRRIGATION AND DEBRIDEMENT, AND PACKING (N/A) as a surgical intervention.  The patient's history has been reviewed, patient examined, no change in status, stable for surgery.  I have reviewed the patient's chart and labs.  Questions were answered to the patient's satisfaction.     Steva Ready

## 2022-08-28 NOTE — Anesthesia Postprocedure Evaluation (Signed)
Anesthesia Post Note  Patient: Monica Green  Procedure(s) Performed: INCISIONAL WOUND IRRIGATION AND DEBRIDEMENT, AND PACKING (Abdomen)     Patient location during evaluation: PACU Anesthesia Type: General Level of consciousness: awake and alert Pain management: pain level controlled Vital Signs Assessment: post-procedure vital signs reviewed and stable Respiratory status: spontaneous breathing, nonlabored ventilation, respiratory function stable and patient connected to nasal cannula oxygen Cardiovascular status: blood pressure returned to baseline and stable Postop Assessment: no apparent nausea or vomiting Anesthetic complications: no  No notable events documented.  Last Vitals:  Vitals:   08/28/22 1213 08/28/22 1250  BP: 118/72 108/69  Pulse: 98 75  Resp: 15 16  Temp: 37.2 C   SpO2: 95% 96%    Last Pain:  Vitals:   08/28/22 1213  TempSrc: Oral  PainSc: 0-No pain                 Shelton Silvas

## 2022-08-28 NOTE — Anesthesia Preprocedure Evaluation (Addendum)
Anesthesia Evaluation  Patient identified by MRN, date of birth, ID band Patient awake    Reviewed: Allergy & Precautions, NPO status , Patient's Chart, lab work & pertinent test results  Airway Mallampati: I  TM Distance: >3 FB Neck ROM: Full    Dental  (+) Teeth Intact, Dental Advisory Given   Pulmonary neg pulmonary ROS   breath sounds clear to auscultation       Cardiovascular hypertension,  Rhythm:Regular Rate:Normal     Neuro/Psych Seizures -,   negative psych ROS   GI/Hepatic Neg liver ROS, PUD,,,  Endo/Other  negative endocrine ROS    Renal/GU negative Renal ROS     Musculoskeletal negative musculoskeletal ROS (+)    Abdominal   Peds  Hematology negative hematology ROS (+)   Anesthesia Other Findings   Reproductive/Obstetrics                             Anesthesia Physical Anesthesia Plan  ASA: 2  Anesthesia Plan: General   Post-op Pain Management: Tylenol PO (pre-op)* and Toradol IV (intra-op)*   Induction: Intravenous  PONV Risk Score and Plan: 4 or greater and Dexamethasone, Ondansetron, Midazolam and Scopolamine patch - Pre-op  Airway Management Planned: Oral ETT and LMA  Additional Equipment: None  Intra-op Plan:   Post-operative Plan: Extubation in OR  Informed Consent: I have reviewed the patients History and Physical, chart, labs and discussed the procedure including the risks, benefits and alternatives for the proposed anesthesia with the patient or authorized representative who has indicated his/her understanding and acceptance.     Dental advisory given  Plan Discussed with: CRNA  Anesthesia Plan Comments:        Anesthesia Quick Evaluation

## 2022-08-28 NOTE — Anesthesia Procedure Notes (Signed)
Procedure Name: Intubation Date/Time: 08/28/2022 10:05 AM  Performed by: Wilder Glade, CRNAPre-anesthesia Checklist: Patient identified, Emergency Drugs available, Suction available, Patient being monitored and Timeout performed Patient Re-evaluated:Patient Re-evaluated prior to induction Oxygen Delivery Method: Circle system utilized Preoxygenation: Pre-oxygenation with 100% oxygen Induction Type: IV induction Ventilation: Mask ventilation without difficulty Laryngoscope Size: Miller and 2 Grade View: Grade I Tube type: Oral Tube size: 7.0 mm Number of attempts: 1 Airway Equipment and Method: Stylet Placement Confirmation: ETT inserted through vocal cords under direct vision, positive ETCO2 and breath sounds checked- equal and bilateral Secured at: 19 cm Tube secured with: Tape Dental Injury: Teeth and Oropharynx as per pre-operative assessment  Comments: Brief atraumatic dentition unchanged grade 1 easily placed

## 2022-08-29 ENCOUNTER — Encounter (HOSPITAL_COMMUNITY): Payer: Self-pay | Admitting: Obstetrics and Gynecology

## 2022-08-29 LAB — COMPREHENSIVE METABOLIC PANEL
ALT: 9 U/L (ref 0–44)
AST: 6 U/L — ABNORMAL LOW (ref 15–41)
Albumin: 2.1 g/dL — ABNORMAL LOW (ref 3.5–5.0)
Alkaline Phosphatase: 63 U/L (ref 38–126)
Anion gap: 9 (ref 5–15)
BUN: 9 mg/dL (ref 6–20)
CO2: 27 mmol/L (ref 22–32)
Calcium: 7.8 mg/dL — ABNORMAL LOW (ref 8.9–10.3)
Chloride: 103 mmol/L (ref 98–111)
Creatinine, Ser: 0.66 mg/dL (ref 0.44–1.00)
GFR, Estimated: >60 mL/min (ref >60)
Glucose, Bld: 95 mg/dL (ref 70–99)
Potassium: 3.2 mmol/L — ABNORMAL LOW (ref 3.5–5.1)
Sodium: 139 mmol/L (ref 135–145)
Total Bilirubin: 0.4 mg/dL (ref 0.3–1.2)
Total Protein: 6.8 g/dL (ref 6.5–8.1)

## 2022-08-29 LAB — AEROBIC/ANAEROBIC CULTURE W GRAM STAIN (SURGICAL/DEEP WOUND)

## 2022-08-29 MED ORDER — AMOXICILLIN-POT CLAVULANATE 875-125 MG PO TABS
1.0000 | ORAL_TABLET | Freq: Two times a day (BID) | ORAL | Status: DC
  Administered 2022-08-29 – 2022-08-30 (×2): 1 via ORAL
  Filled 2022-08-29 (×2): qty 1

## 2022-08-29 MED ORDER — ACETAMINOPHEN 500 MG PO TABS
1000.0000 mg | ORAL_TABLET | Freq: Four times a day (QID) | ORAL | Status: DC
  Administered 2022-08-29 – 2022-08-31 (×7): 1000 mg via ORAL
  Filled 2022-08-29 (×8): qty 2

## 2022-08-29 MED ORDER — VANCOMYCIN HCL 125 MG PO CAPS: 125.0000 mg | ORAL_CAPSULE | Freq: Two times a day (BID) | ORAL | Status: DC

## 2022-08-29 MED ORDER — DOXYCYCLINE HYCLATE 100 MG PO TABS
100.0000 mg | ORAL_TABLET | Freq: Two times a day (BID) | ORAL | Status: DC
  Administered 2022-08-29 – 2022-08-31 (×4): 100 mg via ORAL
  Filled 2022-08-29 (×4): qty 1

## 2022-08-29 MED ORDER — DIPHENHYDRAMINE HCL 25 MG PO CAPS
25.0000 mg | ORAL_CAPSULE | Freq: Four times a day (QID) | ORAL | Status: DC | PRN
  Administered 2022-08-29: 50 mg via ORAL
  Filled 2022-08-29: qty 2

## 2022-08-29 MED ORDER — IBUPROFEN 600 MG PO TABS
600.0000 mg | ORAL_TABLET | Freq: Four times a day (QID) | ORAL | Status: DC
  Administered 2022-08-29 – 2022-08-30 (×5): 600 mg via ORAL
  Filled 2022-08-29 (×7): qty 1

## 2022-08-29 NOTE — Progress Notes (Signed)
1 Day Post-Op Procedure(s): INCISIONAL WOUND IRRIGATION AND DEBRIDEMENT, AND PACKING  Subjective: Patient reports tolerating PO, + flatus, + BM, and no problems voiding.    Objective: BP 117/75 (BP Location: Left Arm)   Pulse (!) 56   Temp 97.9 F (36.6 C) (Oral)   Resp 17   SpO2 100%   General: alert and cooperative Resp: clear to auscultation bilaterally Cardio: regular rate and rhythm GI: soft, non-tender; bowel sounds normal; no masses,  no organomegaly and incision CDI Extremities: Homans sign is negative, no sign of DVT Vaginal Bleeding: none  Assessment: s/p Procedure(s): INCISIONAL WOUND IRRIGATION AND DEBRIDEMENT, AND PACKING: stable, progressing well, and tolerating diet  Plan: Advance to PO medication Discontinue IV fluids Plan to get home health care for dresing changes the pt has two babies in different NICU settings and doesn't think she or any family members can do it.   LOS: 2 days    Arlen Legendre A Vence Lalor 08/29/2022, 4:16 PM

## 2022-08-29 NOTE — Consult Note (Addendum)
WOC consult requested for abd wound. Pt went to the OR yesterday; Secure Chat message sent to primary physician to determine when they would like to assess the wound appearance together since it will be the first post-op dressing change; will await response to schedule a time.   Post note 10:15: Discussed plan of care via Secure chat message with physician and they plan to assess the patient later this afternoon and state they do not need to be present for dressing change this morning.  Pt was medicated for pain prior to the procedure and tolerated with minimal amt discomfort.  Full thickness lower abd post-op wound is beefy red and moist, 3X15X2cm, 5 cm undermining at upper edges from 9:00 o'clock to 3:00 o'clock. Mod amt pink drainage, no bleeding. Applied 3 pieces moist fluffed 4X4 gauze, using swab to fill. Pt states she may have a family member that can assist with dressing changes, but she could benefit from home health assistance.  Topical treatment orders provided for bedside nurses to perform as follows: Bedside nurse; pack abd wound Q day using saline-moistened fluffed kerlex roll and swab to fill in edges underneath, then cover with ABD pad and tape.  Please re-consult if further assistance is needed.  Thank-you,  Cammie Mcgee MSN, RN, CWOCN, [place], CNS [phone]

## 2022-08-29 NOTE — Progress Notes (Signed)
Chaplain attempted visit with Monica Green in the setting of readmission. Monica Green is known to this Clinical research associate from her prenatal stay and her children's NICU admission. Kim was on the phone at the time of my visit. Made brief introduction to refresh her memory about the availability of spiritual care and offer support. Will follow up tomorrow.  Please page as further needs arise.  Maryanna Shape. Carley Hammed, M.Div. [facility] Chaplain Pager [phone] Office [phone]

## 2022-08-30 LAB — AEROBIC/ANAEROBIC CULTURE W GRAM STAIN (SURGICAL/DEEP WOUND)

## 2022-08-30 MED ORDER — CEPHALEXIN 500 MG PO CAPS
500.0000 mg | ORAL_CAPSULE | Freq: Two times a day (BID) | ORAL | Status: DC
  Administered 2022-08-30 – 2022-08-31 (×2): 500 mg via ORAL
  Filled 2022-08-30 (×2): qty 1

## 2022-08-30 MED ORDER — POTASSIUM CHLORIDE CRYS ER 20 MEQ PO TBCR
40.0000 meq | EXTENDED_RELEASE_TABLET | Freq: Two times a day (BID) | ORAL | Status: DC
  Administered 2022-08-30 – 2022-08-31 (×2): 40 meq via ORAL
  Filled 2022-08-30 (×2): qty 2

## 2022-08-30 NOTE — Progress Notes (Signed)
Pt has had dressing change today and family member of choice has been taught wet to dry dressing . Signs and symptoms of infection discussed  also family member feels she will be able to change dressing as instructed

## 2022-08-30 NOTE — TOC Initial Note (Signed)
Transition of Care [facility]) - Initial/Assessment Note    Patient Details  Name: Monica Green MRN: [id number] Date of Birth: 1985-08-30  Transition of Care [facility]) CM/SW Contact:    Lawerance Sabal, RN Phone Number: 08/30/2022, 10:54 AM  Clinical Narrative:                  Requested by provider to set up home health for dressing changes.  Attempted to arrange with  [address], [facility], [facility], [place], [place], [place], [facility], and [facility]. No agency was able to accept.   In secure chat with team Dr Normand Sloop states they can see her on weekdays in the office. I spoke w the patient at bedside and she states that she will have a friend/ family member to learn dressing changes to do them on the weekends.   Expected Discharge Plan: Home/Self Care Barriers to Discharge: Continued Medical Work up   Patient Goals and CMS Choice Patient states their goals for this hospitalization and ongoing recovery are:: to return home          Expected Discharge Plan and Services   Discharge Planning Services: CM Consult                                          Prior Living Arrangements/Services   Lives with:: Spouse                   Activities of Daily Living Home Assistive Devices/Equipment: Contact lenses ADL Screening (condition at time of admission) Patient's cognitive ability adequate to safely complete daily activities?: Yes Is the patient deaf or have difficulty hearing?: No Does the patient have difficulty seeing, even when wearing glasses/contacts?: No Does the patient have difficulty concentrating, remembering, or making decisions?: No Patient able to express need for assistance with ADLs?: Yes Does the patient have difficulty dressing or bathing?: No Independently performs ADLs?: Yes (appropriate for developmental age) Does the patient have difficulty walking or climbing stairs?: No Weakness of Legs: None Weakness of Arms/Hands: None  Permission  Sought/Granted                  Emotional Assessment              Admission diagnosis:  Wound infection [T14.8XXA, L08.9] Patient Active Problem List   Diagnosis Date Noted   Wound infection 08/27/2022   Preterm delivery 08/12/2022   Postpartum hemorrhage 08/12/2022   Normal postpartum course 08/12/2022   Status post primary low transverse cesarean section 08/10/2022   Fetal heart rate decelerations affecting management of mother 08/07/2022   Preterm contractions 08/07/2022   Dichorionic diamniotic twin pregnancy, antepartum 07/24/2022   Abnormal maternal serum screening test 07/24/2022   AMA (advanced maternal age) primigravida 35+, third trimester 11/23/2020   Intrauterine growth restriction affecting antepartum care of mother in third trimester, fetus 2 11/23/2020   IDA (iron deficiency anemia) 07/29/2020   Ulcerative colitis (HCC) 04/08/2015   PCP:  Verlon Au, MD Pharmacy:   [facility] [phone] - [place], [place] - [address] [place] [address] Phone: [phone] Fax: [phone]     Social Determinants of Health (SDOH) Social History: SDOH Screenings   Tobacco Use: Low Risk  (08/29/2022)   SDOH Interventions:     Readmission Risk Interventions     No data to display

## 2022-08-30 NOTE — Progress Notes (Addendum)
2 Days Post-Op Procedure(s) (LRB): INCISIONAL WOUND IRRIGATION AND DEBRIDEMENT, AND PACKING (N/A)  Subjective: Patient reports tolerating PO, + flatus, + BM, and no problems voiding.    Objective: I have reviewed patient's vital signs, medications, and labs.  General: alert and no distress Resp: Unlabored breathing Cardio: regular rate and rhythm GI: Soft, app tender, ND Extremities: no calf tenderness  Assessment: s/p Procedure(s): INCISIONAL WOUND IRRIGATION AND DEBRIDEMENT, AND PACKING (N/A):  Stable  Plan: Wound changes per wound care team by  Nursing staff Will discuss with ID changing augmentin d/t soft stools Pt didn't want to be discharged today d/t late discharge planning SW and HH reported they could not find any HH care for pt.  Her mother-in-law was given instructions today and will do dressing changes on the weekends.  The pt will come daily to office for dressing changes.   LOS: 3 days    Purcell Nails, MD 08/30/2022, 9:02 AM   Spoke with ID, they recommend Keflex and doxy

## 2022-08-31 ENCOUNTER — Other Ambulatory Visit (HOSPITAL_COMMUNITY): Payer: Self-pay

## 2022-08-31 ENCOUNTER — Telehealth (HOSPITAL_COMMUNITY): Payer: Self-pay | Admitting: Pharmacy Technician

## 2022-08-31 ENCOUNTER — Encounter: Payer: Self-pay | Admitting: Hematology

## 2022-08-31 LAB — CBC WITH DIFFERENTIAL/PLATELET
Abs Immature Granulocytes: 0.15 10*3/uL — ABNORMAL HIGH (ref 0.00–0.07)
Basophils Absolute: 0.1 10*3/uL (ref 0.0–0.1)
Basophils Relative: 2 %
Eosinophils Absolute: 1 10*3/uL — ABNORMAL HIGH (ref 0.0–0.5)
Eosinophils Relative: 14 %
HCT: 27.5 % — ABNORMAL LOW (ref 36.0–46.0)
Hemoglobin: 8 g/dL — ABNORMAL LOW (ref 12.0–15.0)
Immature Granulocytes: 2 %
Lymphocytes Relative: 38 %
Lymphs Abs: 2.7 10*3/uL (ref 0.7–4.0)
MCH: 24 pg — ABNORMAL LOW (ref 26.0–34.0)
MCHC: 29.1 g/dL — ABNORMAL LOW (ref 30.0–36.0)
MCV: 82.6 fL (ref 80.0–100.0)
Monocytes Absolute: 1 10*3/uL (ref 0.1–1.0)
Monocytes Relative: 14 %
Neutro Abs: 2.1 10*3/uL (ref 1.7–7.7)
Neutrophils Relative %: 30 %
Platelets: 749 10*3/uL — ABNORMAL HIGH (ref 150–400)
RBC: 3.33 MIL/uL — ABNORMAL LOW (ref 3.87–5.11)
RDW: 22.7 % — ABNORMAL HIGH (ref 11.5–15.5)
WBC: 7 10*3/uL (ref 4.0–10.5)
nRBC: 0.7 % — ABNORMAL HIGH (ref 0.0–0.2)

## 2022-08-31 LAB — COMPREHENSIVE METABOLIC PANEL
ALT: 10 U/L (ref 0–44)
AST: 8 U/L — ABNORMAL LOW (ref 15–41)
Albumin: 1.9 g/dL — ABNORMAL LOW (ref 3.5–5.0)
Alkaline Phosphatase: 58 U/L (ref 38–126)
Anion gap: 4 — ABNORMAL LOW (ref 5–15)
BUN: 11 mg/dL (ref 6–20)
CO2: 27 mmol/L (ref 22–32)
Calcium: 8.5 mg/dL — ABNORMAL LOW (ref 8.9–10.3)
Chloride: 108 mmol/L (ref 98–111)
Creatinine, Ser: 0.64 mg/dL (ref 0.44–1.00)
GFR, Estimated: >60 mL/min (ref >60)
Glucose, Bld: 86 mg/dL (ref 70–99)
Potassium: 4 mmol/L (ref 3.5–5.1)
Sodium: 139 mmol/L (ref 135–145)
Total Bilirubin: <0.1 mg/dL — ABNORMAL LOW (ref 0.3–1.2)
Total Protein: 6.4 g/dL — ABNORMAL LOW (ref 6.5–8.1)

## 2022-08-31 LAB — AEROBIC/ANAEROBIC CULTURE W GRAM STAIN (SURGICAL/DEEP WOUND)

## 2022-08-31 MED ORDER — METRONIDAZOLE 500 MG PO TABS
500.0000 mg | ORAL_TABLET | Freq: Two times a day (BID) | ORAL | 0 refills | Status: AC
  Filled 2022-08-31: qty 20, 10d supply, fill #0

## 2022-08-31 MED ORDER — FERROUS SULFATE 325 (65 FE) MG PO TABS
325.0000 mg | ORAL_TABLET | Freq: Every day | ORAL | 3 refills | Status: AC
  Filled 2022-08-31: qty 100, 100d supply, fill #0

## 2022-08-31 MED ORDER — OXYCODONE HCL 10 MG PO TABS
10.0000 mg | ORAL_TABLET | ORAL | 0 refills | Status: AC | PRN
  Filled 2022-08-31: qty 42, 7d supply, fill #0

## 2022-08-31 MED ORDER — CEFADROXIL 500 MG PO CAPS
1.0000 g | ORAL_CAPSULE | Freq: Two times a day (BID) | ORAL | 0 refills | Status: AC
  Filled 2022-08-31: qty 40, 10d supply, fill #0

## 2022-08-31 MED ORDER — IBUPROFEN 600 MG PO TABS
600.0000 mg | ORAL_TABLET | Freq: Four times a day (QID) | ORAL | 0 refills | Status: AC | PRN
  Filled 2022-08-31: qty 30, 10d supply, fill #0

## 2022-08-31 MED ORDER — ACETAMINOPHEN 500 MG PO TABS
1000.0000 mg | ORAL_TABLET | Freq: Four times a day (QID) | ORAL | 0 refills | Status: AC
  Filled 2022-08-31: qty 100, 13d supply, fill #0

## 2022-08-31 NOTE — Telephone Encounter (Signed)
Patient Advocate Encounter  Prior Authorization for oxyCODONE HCl 10MG  tablets  has been approved.    PA# [id number] Insurance [facility] Medicaid of [facility] Electronic Prior Authorization Request Form  Effective dates: 08/31/2022 through 02/27/2023      Roland Earl, CPhT Pharmacy Patient Advocate Specialist [facility] Patient Advocate Team Direct Number: [phone]  Fax: [phone]

## 2022-08-31 NOTE — Discharge Summary (Signed)
Postpartum Discharge Summary  Date of Service 08/31/2022     Patient Name: Monica Green DOB: 17-Aug-1985 MRN: [id number]  Date of admission: 08/27/2022 Delivery date: 08/10/2022 Delivering provider: Dr. 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 She was discharged on 08/12/2022 and re-admitted on 08/27/2022 for post-op wound infection of pfannenstiel incision. She had wound debridement on 08/28/2022 and started on a course of abx during admission to cover for MRSA. Final culture resulted on 08/31/2022 and no MRSA so patient discharged to home on Cefadroxil and Flagyl after discussion w/ ID and pharmacy. Daily wet-to-dry dressing performed and to be continued outpatient. Noted hypokalemia and potassium replaced during admission, wnl on discharge. On iron supplementation for chronic anemia and hgb improving. Thrombocytosis - platelet count down trending.   Physical exam  Vitals:   08/30/22 1615 08/30/22 1940 08/31/22 0338 08/31/22 0831  BP: 132/84 133/83 124/70 113/81  Pulse: 85 62 (!) 54 (!) 57  Resp: 17 16 17 16   Temp: 98.6 F (37 C) 98.1 F (36.7 C) 98 F (36.7 C) 98.1 F (36.7 C)  TempSrc: Oral Oral Oral Oral  SpO2: 98% 97% 99% 100%   General: alert, cooperative, and no distress Resp: Unlabored breathing Cardio: regular rate and rhythm GI: Soft, app tender, ND Incision: performed wet-to-dry dressing change, incision healing appropriately, guazed 10% saturated with blood and <5% saturated with purulent drainage (much improved), incision irrigated with  sterile saline and re-packed with moist gauzed and covered. DVT Evaluation: No evidence of DVT seen on physical exam. No significant calf/ankle edema.  Labs: Lab Results  Component Value Date   WBC 7.0 08/31/2022   HGB 8.0 (L) 08/31/2022   HCT 27.5 (L) 08/31/2022   MCV 82.6 08/31/2022   PLT 749 (H) 08/31/2022      Latest Ref Rng & Units 08/31/2022    6:28 AM  CMP  Glucose 70 - 99 mg/dL 86   BUN 6 - 20 mg/dL 11   Creatinine 4.09 - 1.00 mg/dL 8.11   Sodium 914 - 782 mmol/L 139   Potassium 3.5 - 5.1 mmol/L 4.0   Chloride 98 - 111 mmol/L 108   CO2 22 - 32 mmol/L 27   Calcium 8.9 - 10.3 mg/dL 8.5   Total Protein 6.5 - 8.1 g/dL 6.4   Total Bilirubin 0.3 - 1.2 mg/dL <9.5   Alkaline Phos 38 - 126 U/L 58   AST 15 - 41 U/L 8   ALT 0 - 44 U/L 10    Edinburgh Score:    08/11/2022    9:00 AM  Edinburgh Postnatal Depression Scale Screening Tool  I have been able to laugh and see the funny side of things. 0  I have looked forward with enjoyment to things. 0  I have blamed myself unnecessarily when things went wrong. 1  I have been anxious or worried for no good reason. 1  I  have felt scared or panicky for no good reason. 0  Things have been getting on top of me. 1  I have been so unhappy that I have had difficulty sleeping. 0  I have felt sad or miserable. 0  I have been so unhappy that I have been crying. 0  The thought of harming myself has occurred to me. 0  Edinburgh Postnatal Depression Scale Total 3      After visit meds:  Allergies as of 08/31/2022   No Known Allergies      Medication List     STOP taking these medications    B-12 1000 MCG Subl   iron polysaccharides 150 MG capsule Commonly known as: NIFEREX   magnesium oxide 400 (240 Mg) MG tablet Commonly known as: MAG-OX   potassium chloride SA 20 MEQ tablet Commonly known as: KLOR-CON M       TAKE these medications    acetaminophen 500 MG tablet Commonly known as: TYLENOL Take 2 tablets (1,000  mg total) by mouth every 6 (six) hours for 4 days then as needed   cefadroxil 500 MG capsule Commonly known as: DURICEF Take 2 capsules (1,000 mg total) by mouth 2 (two) times daily for 10 days.   ferrous sulfate 325 (65 FE) MG tablet Take 1 tablet (325 mg total) by mouth daily with breakfast.   ibuprofen 600 MG tablet Commonly known as: ADVIL Take 1 tablet (600 mg total) by mouth every 6 (six) hours as needed for up to 10 days. What changed:  when to take this reasons to take this   metroNIDAZOLE 500 MG tablet Commonly known as: Flagyl Take 1 tablet (500 mg total) by mouth 2 (two) times daily for 10 days.   Oxycodone HCl 10 MG Tabs Take 1 tablet (10 mg total) by mouth every 4 (four) hours as needed for up to 7 days for severe pain, breakthrough pain or moderate pain.   prenatal multivitamin Tabs tablet Take 1 tablet by mouth daily at 12 noon. What changed: when to take this               Discharge Care Instructions  (From admission, onward)           Start     Ordered   08/31/22 0000  Discharge wound care:       Comments: Daily wet to dry dressing change. Will have done in office during the week and will have mother-in-law and cousin change on weekends. Daily visits scheduled with [facility] for the next 3 weeks. Will extend or shorten PRN.   08/31/22 1217             Discharge home in stable condition Activity: Advance as tolerated. Pelvic rest for 6 weeks.  Diet: routine diet Anticipated Birth Control: Unsure Postpartum Appointment: daily during weekdays for wound dressing change.  Additional Postpartum F/U: Incision check daily  repeat CBC PP  Future Appointments: Future Appointments  Date Time Provider Department Center  11/03/2022 10:00 AM [facility] None  11/03/2022 10:30 AM Johney Maine, MD [facility] None   Follow up Visit:  Follow-up Information     [facility]. Go in 1 day(s).   Specialty:  Obstetrics and Gynecology Why: Daily for wound care, appointments already scheduled. Contact information: [address] [place] [address] [phone]                    08/31/2022 Jackie Plum,  MD

## 2022-08-31 NOTE — Plan of Care (Signed)
  Problem: Education: Goal: Knowledge of disease or condition will improve Outcome: Adequate for Discharge Goal: Knowledge of the prescribed therapeutic regimen will improve Outcome: Adequate for Discharge Goal: Individualized Educational Video(s) Outcome: Adequate for Discharge   Problem: Clinical Measurements: Goal: Complications related to the disease process, condition or treatment will be avoided or minimized Outcome: Adequate for Discharge   Problem: Education: Goal: Knowledge of the prescribed therapeutic regimen will improve Outcome: Adequate for Discharge Goal: Understanding of sexual limitations or changes related to disease process or condition will improve Outcome: Adequate for Discharge Goal: Individualized Educational Video(s) Outcome: Adequate for Discharge   Problem: Self-Concept: Goal: Communication of feelings regarding changes in body function or appearance will improve Outcome: Adequate for Discharge   Problem: Skin Integrity: Goal: Demonstration of wound healing without infection will improve Outcome: Adequate for Discharge   Problem: Education: Goal: Knowledge of condition will improve Outcome: Adequate for Discharge Goal: Individualized Educational Video(s) Outcome: Adequate for Discharge Goal: Individualized Newborn Educational Video(s) Outcome: Adequate for Discharge   Problem: Activity: Goal: Will verbalize the importance of balancing activity with adequate rest periods Outcome: Adequate for Discharge Goal: Ability to tolerate increased activity will improve Outcome: Adequate for Discharge   Problem: Coping: Goal: Ability to identify and utilize available resources and services will improve Outcome: Adequate for Discharge   Problem: Life Cycle: Goal: Chance of risk for complications during the postpartum period will decrease Outcome: Adequate for Discharge   Problem: Role Relationship: Goal: Ability to demonstrate positive interaction with  newborn will improve Outcome: Adequate for Discharge   Problem: Skin Integrity: Goal: Demonstration of wound healing without infection will improve Outcome: Adequate for Discharge   Problem: Education: Goal: Knowledge of General Education information will improve Description: Including pain rating scale, medication(s)/side effects and non-pharmacologic comfort measures Outcome: Adequate for Discharge   Problem: Health Behavior/Discharge Planning: Goal: Ability to manage health-related needs will improve Outcome: Adequate for Discharge   Problem: Clinical Measurements: Goal: Ability to maintain clinical measurements within normal limits will improve Outcome: Adequate for Discharge Goal: Will remain free from infection Outcome: Adequate for Discharge Goal: Diagnostic test results will improve Outcome: Adequate for Discharge Goal: Respiratory complications will improve Outcome: Adequate for Discharge Goal: Cardiovascular complication will be avoided Outcome: Adequate for Discharge   Problem: Activity: Goal: Risk for activity intolerance will decrease Outcome: Adequate for Discharge   Problem: Nutrition: Goal: Adequate nutrition will be maintained Outcome: Adequate for Discharge   Problem: Coping: Goal: Level of anxiety will decrease Outcome: Adequate for Discharge   Problem: Elimination: Goal: Will not experience complications related to bowel motility Outcome: Adequate for Discharge Goal: Will not experience complications related to urinary retention Outcome: Adequate for Discharge   Problem: Pain Managment: Goal: General experience of comfort will improve Outcome: Adequate for Discharge   Problem: Safety: Goal: Ability to remain free from injury will improve Outcome: Adequate for Discharge   Problem: Skin Integrity: Goal: Risk for impaired skin integrity will decrease Outcome: Adequate for Discharge

## 2022-08-31 NOTE — Telephone Encounter (Signed)
Patient Advocate Encounter   Received notification that prior authorization for oxyCODONE HCl 10MG  tablets is required.   PA submitted on 08/31/2022 Key [id number] Insurance [facility] Medicaid of [facility] Electronic Prior Authorization Request Form Status is pending       Roland Earl, CPhT Pharmacy Patient Advocate Specialist [facility] Patient Advocate Team Direct Number: [phone]  Fax: [phone]

## 2022-08-31 NOTE — Progress Notes (Signed)
CSW met with MOB at her bedside in room 107.  When CSW arrived, MOB was resting in  bed engaging with her 2 visitors.  MOB offered to return at a later time and MOB declined.  MOB gave CSW permission to with MOB while her guest were present; MOB's guest appeared to be a support to MOB as MOB shared that one guest has been going to visit [facility] at [facility].  CSW assessed for psychosocial stressors and MOB denied all stressors.  MOB shared being re-admitted to the hospital is an indication that she needs to take better care of herself. CSW assessed for PMADs symptoms and MOB denied having any symptoms.  MOB reports feeling well informed by NICU team and she denied having any questions or concerns. MOB did bring to MOB's attention the her NICVIEW camera was not working; CSW agreed to follow-up with RN about problem.  CSW will continue to offer resources and supports to family while infant remains in NICU.    Valentino Saavedra Boyd-Gilyard, MSW, LCSW Clinical Social Work [phone]  

## 2022-09-27 ENCOUNTER — Telehealth: Payer: Self-pay | Admitting: Hematology

## 2022-11-03 ENCOUNTER — Other Ambulatory Visit: Payer: Medicaid Other

## 2022-11-03 ENCOUNTER — Ambulatory Visit: Payer: Medicaid Other | Admitting: Hematology

## 2022-11-13 ENCOUNTER — Other Ambulatory Visit: Payer: Self-pay

## 2022-11-13 DIAGNOSIS — D509 Iron deficiency anemia, unspecified: Secondary | ICD-10-CM

## 2022-11-14 ENCOUNTER — Inpatient Hospital Stay: Payer: Medicaid Other | Admitting: Hematology

## 2022-11-14 ENCOUNTER — Inpatient Hospital Stay: Payer: Medicaid Other | Attending: Hematology

## 2022-12-11 ENCOUNTER — Telehealth: Payer: Self-pay | Admitting: Hematology

## 2022-12-11 NOTE — Telephone Encounter (Signed)
Unable to leave message about rescheduling appointment and also unable to leave callback due to voicemail box being full

## 2022-12-28 ENCOUNTER — Other Ambulatory Visit: Payer: Medicaid Other

## 2022-12-29 ENCOUNTER — Encounter: Payer: Self-pay | Admitting: Internal Medicine

## 2022-12-29 DIAGNOSIS — K5100 Ulcerative (chronic) pancolitis without complications: Secondary | ICD-10-CM

## 2023-01-02 ENCOUNTER — Other Ambulatory Visit: Payer: Self-pay

## 2023-01-02 DIAGNOSIS — D509 Iron deficiency anemia, unspecified: Secondary | ICD-10-CM

## 2023-01-02 NOTE — Addendum Note (Signed)
Addended by: Loretha Stapler on: 01/02/2023 12:22 PM   Modules accepted: Orders

## 2023-01-03 ENCOUNTER — Other Ambulatory Visit: Payer: Medicaid Other

## 2023-01-03 ENCOUNTER — Inpatient Hospital Stay: Payer: Medicaid Other | Attending: Hematology

## 2023-01-03 ENCOUNTER — Inpatient Hospital Stay (HOSPITAL_BASED_OUTPATIENT_CLINIC_OR_DEPARTMENT_OTHER): Payer: Medicaid Other | Admitting: Hematology

## 2023-01-03 VITALS — BP 98/67 | HR 87 | Temp 97.3°F | Resp 20 | Wt 129.9 lb

## 2023-01-03 DIAGNOSIS — K51911 Ulcerative colitis, unspecified with rectal bleeding: Secondary | ICD-10-CM | POA: Diagnosis not present

## 2023-01-03 DIAGNOSIS — Z8249 Family history of ischemic heart disease and other diseases of the circulatory system: Secondary | ICD-10-CM | POA: Diagnosis not present

## 2023-01-03 DIAGNOSIS — Z825 Family history of asthma and other chronic lower respiratory diseases: Secondary | ICD-10-CM | POA: Insufficient documentation

## 2023-01-03 DIAGNOSIS — Z3A22 22 weeks gestation of pregnancy: Secondary | ICD-10-CM | POA: Insufficient documentation

## 2023-01-03 DIAGNOSIS — E538 Deficiency of other specified B group vitamins: Secondary | ICD-10-CM | POA: Diagnosis not present

## 2023-01-03 DIAGNOSIS — K5900 Constipation, unspecified: Secondary | ICD-10-CM | POA: Insufficient documentation

## 2023-01-03 DIAGNOSIS — K5100 Ulcerative (chronic) pancolitis without complications: Secondary | ICD-10-CM

## 2023-01-03 DIAGNOSIS — O99012 Anemia complicating pregnancy, second trimester: Secondary | ICD-10-CM | POA: Diagnosis present

## 2023-01-03 DIAGNOSIS — Z79899 Other long term (current) drug therapy: Secondary | ICD-10-CM | POA: Diagnosis not present

## 2023-01-03 DIAGNOSIS — O99282 Endocrine, nutritional and metabolic diseases complicating pregnancy, second trimester: Secondary | ICD-10-CM | POA: Insufficient documentation

## 2023-01-03 DIAGNOSIS — Z800 Family history of malignant neoplasm of digestive organs: Secondary | ICD-10-CM | POA: Insufficient documentation

## 2023-01-03 DIAGNOSIS — D509 Iron deficiency anemia, unspecified: Secondary | ICD-10-CM | POA: Insufficient documentation

## 2023-01-03 DIAGNOSIS — O09292 Supervision of pregnancy with other poor reproductive or obstetric history, second trimester: Secondary | ICD-10-CM | POA: Diagnosis not present

## 2023-01-03 LAB — CBC WITH DIFFERENTIAL (CANCER CENTER ONLY)
Abs Immature Granulocytes: 0.01 10*3/uL (ref 0.00–0.07)
Basophils Absolute: 0.1 10*3/uL (ref 0.0–0.1)
Basophils Relative: 2 %
Eosinophils Absolute: 1 10*3/uL — ABNORMAL HIGH (ref 0.0–0.5)
Eosinophils Relative: 18 %
HCT: 31.5 % — ABNORMAL LOW (ref 36.0–46.0)
Hemoglobin: 8.8 g/dL — ABNORMAL LOW (ref 12.0–15.0)
Immature Granulocytes: 0 %
Lymphocytes Relative: 32 %
Lymphs Abs: 1.7 10*3/uL (ref 0.7–4.0)
MCH: 19.8 pg — ABNORMAL LOW (ref 26.0–34.0)
MCHC: 27.9 g/dL — ABNORMAL LOW (ref 30.0–36.0)
MCV: 70.8 fL — ABNORMAL LOW (ref 80.0–100.0)
Monocytes Absolute: 1 10*3/uL (ref 0.1–1.0)
Monocytes Relative: 18 %
Neutro Abs: 1.6 10*3/uL — ABNORMAL LOW (ref 1.7–7.7)
Neutrophils Relative %: 30 %
Platelet Count: 628 10*3/uL — ABNORMAL HIGH (ref 150–400)
RBC: 4.45 MIL/uL (ref 3.87–5.11)
RDW: 16.7 % — ABNORMAL HIGH (ref 11.5–15.5)
WBC Count: 5.3 10*3/uL (ref 4.0–10.5)
nRBC: 0 % (ref 0.0–0.2)

## 2023-01-03 LAB — IRON AND IRON BINDING CAPACITY (CC-WL,HP ONLY)
Iron: 12 ug/dL — ABNORMAL LOW (ref 28–170)
Saturation Ratios: 3 % — ABNORMAL LOW (ref 10.4–31.8)
TIBC: 386 ug/dL (ref 250–450)
UIBC: 374 ug/dL (ref 148–442)

## 2023-01-03 LAB — CMP (CANCER CENTER ONLY)
ALT: 6 U/L (ref 0–44)
AST: 8 U/L — ABNORMAL LOW (ref 15–41)
Albumin: 3.6 g/dL (ref 3.5–5.0)
Alkaline Phosphatase: 49 U/L (ref 38–126)
Anion gap: 5 (ref 5–15)
BUN: 9 mg/dL (ref 6–20)
CO2: 28 mmol/L (ref 22–32)
Calcium: 9.2 mg/dL (ref 8.9–10.3)
Chloride: 105 mmol/L (ref 98–111)
Creatinine: 0.69 mg/dL (ref 0.44–1.00)
GFR, Estimated: >60 mL/min (ref >60)
Glucose, Bld: 87 mg/dL (ref 70–99)
Potassium: 3.6 mmol/L (ref 3.5–5.1)
Sodium: 138 mmol/L (ref 135–145)
Total Bilirubin: 0.3 mg/dL (ref 0.3–1.2)
Total Protein: 8 g/dL (ref 6.5–8.1)

## 2023-01-03 LAB — FERRITIN: Ferritin: 3 ng/mL — ABNORMAL LOW (ref 11–307)

## 2023-01-03 LAB — VITAMIN B12: Vitamin B-12: 339 pg/mL (ref 180–914)

## 2023-01-03 LAB — C-REACTIVE PROTEIN: CRP: 1.7 mg/dL — ABNORMAL HIGH (ref <1.0)

## 2023-01-03 NOTE — Progress Notes (Signed)
HEMATOLOGY/ONCOLOGY CLINIC NOTE  Date of Service: 01/03/23   Patient Care Team: Verlon Au, MD as PCP - General (Family Medicine) Hands, Jenel Lucks, NP as PCP - OBGYN (Obstetrics and Gynecology)  CHIEF COMPLAINTS/PURPOSE OF CONSULTATION:  Follow-up for iron deficiency anemia  HISTORY OF PRESENTING ILLNESS:   Monica Green is a wonderful 37 y.o. female who has been referred to [place] by Nigel Bridgeman, CNM at [facility] for evaluation and management of chronic severe anemia. The pt reports that she is doing well overall.  The pt reports that she is currently [redacted] weeks pregnant. She was seen previously by Dr. Pamelia Hoit in November 2021 in the hospital for severe anemia due to her ulcerative colitis. The pt notes that she had an allergic reaction to the third and last blood transfusion reactive due to eye itching and hives over her face. She experienced no issues tolerating the Feraheme she received in the past. The pt notes her ulcerative colitis has not been bothering her recently. The pt notes she sees a GI at [place]. The pt notes that she has needed IV iron in the past. The pt notes no treatments for her ulcerative colitis that was diagnosed in 2017 due to feelings of drowsiness ad heaviness in the past. The pt has been monitoring her food intake. The pt notes that she has not been taking iron supplements in her prenatal, but denies any Iron supplementation by itself due to constipation. The pt notes she was experiencing heavy periods for full seven days in the past prior to getting pregnant. The pt notes that she has not been to her GI since becoming pregnant. The pt experiences regular bowel habits with no noticeable blood or diarrhea. The pt notes that she was recently given an antibiotic for a UTI. 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 The pt notes she has recently been experiencing ice cravings within the last two weeks.  The pt  notes no other medical issues outside of her ulcerative colitis or familial blood disorders. The pt notes only social use of alcohol and denies any smoking prior to pregnancy. The pt notes her paternal grandmother and mother have diverticulitis but notes no major medical issues that run within the family.  Lab results 07/12/2020 of CBC w/diff and CMP is as follows: all values are WNL except for Hgb of 6.6, RBC of 3.47, HCT of 23.6, MCV of 69, MCH of 19.0, MCHC of 27.7, RDW of 18.1. 07/12/2020 Vitamin D 25-Hydroxy of 22.3.  On review of systems, pt reports lightheadedness, dizziness, ice cravings, and denies acute bloody/black stools, abdominal pain, and any other symptoms.  INTERVAL HISTORY:  Monica Green is a wonderful 37 y.o. female who is here for f/u of iron deficiency anemia in pregnancy.  Patient was last seen by me on 07/24/2022 and was doing well overall with no new medical concerns. She was 32 weeks at the time with a twin pregnancy.   She delivered birth to twins on 08/10/2022 via cesarean-section. Patient was admitted on 08/23/2022 after the left side of her C-section incision opened with plenty of drainage and pain. Patient had a wound infection and was taking Keflex 500mg  BID and had wound follow-up at [facility].   Patient reports that her baby girl stayed at NICU at [facility] and unfortunately passed away in Nov 25, 2022. Her baby boy stayed at NICU at [facility] and is doing well at this time and is  healthy. Patient also has a 31-year-old daughter.   Patient notes having plenty of bleeding from her incision tear. She reports that her wound issues have resolved and she is slowly returning to her baseline. Her menstrual cycles have restarted and are regular. She is not on any blood controlling medications at this time.   She reports a recent ulcerative colitis flare with increased diarrhea. She generally has at least 7 bowel movements at this time and notes that her baseline is 4 bowel movements  a day. She notes mild blood in her stools sometimes but denies any black stools. Patient is not on any medications for ulcerative colitis at this time and will be seen by Dr. Leonides Schanz on Friday.   She denies any fever, chills, or abdominal pain. Patient sometimes has ice cravings.    She notes that she was previously taking oral iron, but has not for 1 month. Patient is not taking any prenatal vitamins or other vitamins at this time. She denies any issues with IV iron in the past.   She is not breastfeeding at this time. Patient reports that her weight is fairly stable at this time and generally fluctuates between 130-135 pounds  Patient does generally receives additional support at home from family members.   MEDICAL HISTORY:  Past Medical History:  Diagnosis Date   Anemia    Colitis    Eclampsia 12/04/2020   IDA (iron deficiency anemia)    Normal postpartum course 11/26/2020   Pre-eclampsia    Pre-eclampsia in third trimester 11/23/2020   SVD (8/24) 11/24/2020   Term newborn delivered vaginally, current hospitalization 11/24/2020   Thrombocytosis 03/03/2020   Ulcerative colitis (HCC)     SURGICAL HISTORY: Past Surgical History:  Procedure Laterality Date   CESAREAN SECTION     CESAREAN SECTION MULTI-GESTATIONAL N/A 08/10/2022   Procedure: CESAREAN SECTION MULTI-GESTATIONAL;  Surgeon: Jackie Plum, MD;  Location: [facility] LD ORS;  Service: Obstetrics;  Laterality: N/A;   FLEXIBLE SIGMOIDOSCOPY N/A 03/23/2015   Procedure: FLEXIBLE SIGMOIDOSCOPY;  Surgeon: Carman Ching, MD;  Location: [facility] ENDOSCOPY;  Service: Endoscopy;  Laterality: N/A;   IRRIGATION AND DEBRIDEMENT HEMATOMA N/A 08/28/2022   Procedure: INCISIONAL WOUND IRRIGATION AND DEBRIDEMENT, AND PACKING;  Surgeon: Steva Ready, DO;  Location: [facility] OR;  Service: Gynecology;  Laterality: N/A;    SOCIAL HISTORY: Social History   Socioeconomic History   Marital status: Single    Spouse name: Not on file   Number of children:  1   Years of education: Not on file   Highest education level: Not on file  Occupational History   Not on file  Tobacco Use   Smoking status: Never   Smokeless tobacco: Never  Vaping Use   Vaping status: Never Used  Substance and Sexual Activity   Alcohol use: Not Currently    Comment: Social   Drug use: No   Sexual activity: Yes  Other Topics Concern   Not on file  Social History Narrative   Not on file   Social Determinants of Health   Financial Resource Strain: Not on file  Food Insecurity: Not on file  Transportation Needs: Not on file  Physical Activity: Not on file  Stress: Not on file  Social Connections: Not on file  Intimate Partner Violence: Not on file    FAMILY HISTORY: Family History  Problem Relation Age of Onset   Hypertension Father    Cancer Father    Colon cancer Father    Asthma Sister  Asthma Brother    Hypertension Paternal Grandfather    Diabetes Neg Hx    Heart disease Neg Hx     ALLERGIES:  has No Known Allergies.  MEDICATIONS:  Current Outpatient Medications  Medication Sig Dispense Refill   acetaminophen (TYLENOL) 500 MG tablet Take 2 tablets (1,000 mg total) by mouth every 6 (six) hours for 4 days then as needed 100 tablet 0   ferrous sulfate 325 (65 FE) MG tablet Take 1 tablet (325 mg total) by mouth daily with breakfast. 100 tablet 3   Prenatal Vit-Fe Fumarate-FA (PRENATAL MULTIVITAMIN) TABS tablet Take 1 tablet by mouth daily at 12 noon. (Patient taking differently: Take 1 tablet by mouth daily.) 60 tablet 3   No current facility-administered medications for this visit.    REVIEW OF SYSTEMS:    10 Point review of Systems was done is negative except as noted above.   PHYSICAL EXAMINATION: ECOG PERFORMANCE STATUS: 2 - Symptomatic, <50% confined to bed  . Vitals:   01/03/23 1145  BP: 98/67  Pulse: 87  Resp: 20  Temp: (!) 97.3 F (36.3 C)  SpO2: 100%   Filed Weights   01/03/23 1145  Weight: 129 lb 14.4 oz (58.9 kg)    .Body mass index is 21.62 kg/m.   GENERAL:alert, in no acute distress and comfortable SKIN: no acute rashes, no significant lesions EYES: conjunctiva are pink and non-injected, sclera anicteric OROPHARYNX: MMM, no exudates, no oropharyngeal erythema or ulceration NECK: supple, no JVD LYMPH:  no palpable lymphadenopathy in the cervical, axillary or inguinal regions LUNGS: clear to auscultation b/l with normal respiratory effort HEART: regular rate & rhythm ABDOMEN:  normoactive bowel sounds , non tender, not distended. Extremity: no pedal edema PSYCH: alert & oriented x 3 with fluent speech NEURO: no focal motor/sensory deficits    LABORATORY DATA:  I have reviewed the data as listed  .    Latest Ref Rng & Units 01/03/2023   11:18 AM 08/31/2022    6:28 AM 08/27/2022   12:41 PM  CBC  WBC 4.0 - 10.5 K/uL 5.3  7.0  6.8   Hemoglobin 12.0 - 15.0 g/dL 8.8  8.0  7.5   Hematocrit 36.0 - 46.0 % 31.5  27.5  26.3   Platelets 150 - 400 K/uL 628  749  775     .    Latest Ref Rng & Units 01/03/2023   11:18 AM 08/31/2022    6:28 AM 08/29/2022    7:57 AM  CMP  Glucose 70 - 99 mg/dL 87  86  95   BUN 6 - 20 mg/dL 9  11  9    Creatinine 0.44 - 1.00 mg/dL 1.61  0.96  0.45   Sodium 135 - 145 mmol/L 138  139  139   Potassium 3.5 - 5.1 mmol/L 3.6  4.0  3.2   Chloride 98 - 111 mmol/L 105  108  103   CO2 22 - 32 mmol/L 28  27  27    Calcium 8.9 - 10.3 mg/dL 9.2  8.5  7.8   Total Protein 6.5 - 8.1 g/dL 8.0  6.4  6.8   Total Bilirubin 0.3 - 1.2 mg/dL 0.3  <4.0  0.4   Alkaline Phos 38 - 126 U/L 49  58  63   AST 15 - 41 U/L 8  8  6    ALT 0 - 44 U/L 6  10  9     . Lab Results  Component Value Date  IRON 12 (L) 01/03/2023   TIBC 386 01/03/2023   IRONPCTSAT 3 (L) 01/03/2023   (Iron and TIBC)  Lab Results  Component Value Date   FERRITIN 3 (L) 01/03/2023    B12 -- 139--->341-->339   RADIOGRAPHIC STUDIES: I have personally reviewed the radiological images as listed and agreed with  the findings in the report.  ASSESSMENT & PLAN:   37 year old female with  #1  Iron deficiency anemia due to recent pregnancy and GI losses due to ulcerative colitis #2 vitamin B12 deficiency-likely due to poor absorption from ulcerative colitis and from increased demand during pregnancy.  #3 history of ulcerative colitis  PLAN:  -Discussed lab results on 01/03/23 in detail with patient. CBC showed WBC of 5.3K, hemoglobin of 8.8, and platelets of 628K. -patient continues to be anemic at this time -her hemoglobin was as low as 7.5 in the hospital -patient is likely iron deficient  -iron labs from today show significant Iron deficiency -There are several reasons why the patient might develop iron deficiency, including her twin pregnancy, menstrual loss, and blood in the stools -do not anticipate that oral iron would correct iron quick enough -would recommend proceeding with IV iron, which patient is agreeable with -recommend taking vitamin B complex regularly to optimize B vitamins -recommend patient to plan meals to optimize nutrition and improve energy -patient shall return for labs in 3 months to monitor iron levels -continue following with Dr. Leonides Schanz to address ulcerative colitis  FOLLOW-UP: IV monoferric 1000mg  x 1 at [address] in 1 weeks RTC with Dr Candise Che with labs in 3 months  The total time spent in the appointment was 30 minutes* .  All of the patient's questions were answered with apparent satisfaction. The patient knows to call the clinic with any problems, questions or concerns.   Wyvonnia Lora MD MS AAHIVMS [facility] Hematology/Oncology Physician [facility]  .*Total Encounter Time as defined by the Centers for Medicare and Medicaid Services includes, in addition to the face-to-face time of a patient visit (documented in the note above) non-face-to-face time: obtaining and reviewing outside history, ordering and reviewing medications, tests or procedures,  care coordination (communications with other health care professionals or caregivers) and documentation in the medical record.    I,Mitra Faeizi,acting as a Neurosurgeon for Wyvonnia Lora, MD.,have documented all relevant documentation on the behalf of Wyvonnia Lora, MD,as directed by  Wyvonnia Lora, MD while in the presence of Wyvonnia Lora, MD.  .I have reviewed the above documentation for accuracy and completeness, and I agree with the above. Johney Maine MD

## 2023-01-04 LAB — GI PROFILE, STOOL, PCR

## 2023-01-05 ENCOUNTER — Encounter: Payer: Self-pay | Admitting: Physician Assistant

## 2023-01-05 ENCOUNTER — Ambulatory Visit (INDEPENDENT_AMBULATORY_CARE_PROVIDER_SITE_OTHER): Payer: Medicaid Other | Admitting: Physician Assistant

## 2023-01-05 ENCOUNTER — Ambulatory Visit: Payer: Medicaid Other | Admitting: Internal Medicine

## 2023-01-05 VITALS — BP 120/68 | HR 63 | Ht 64.0 in | Wt 131.2 lb

## 2023-01-05 DIAGNOSIS — E559 Vitamin D deficiency, unspecified: Secondary | ICD-10-CM | POA: Diagnosis not present

## 2023-01-05 DIAGNOSIS — Z1159 Encounter for screening for other viral diseases: Secondary | ICD-10-CM

## 2023-01-05 DIAGNOSIS — K5100 Ulcerative (chronic) pancolitis without complications: Secondary | ICD-10-CM

## 2023-01-05 DIAGNOSIS — Z111 Encounter for screening for respiratory tuberculosis: Secondary | ICD-10-CM

## 2023-01-05 DIAGNOSIS — D508 Other iron deficiency anemias: Secondary | ICD-10-CM

## 2023-01-05 MED ORDER — NA SULFATE-K SULFATE-MG SULF 17.5-3.13-1.6 GM/177ML PO SOLN: ORAL | 0 refills | Status: AC

## 2023-01-05 MED ORDER — DICYCLOMINE HCL 20 MG PO TABS: 20.0000 mg | ORAL_TABLET | Freq: Three times a day (TID) | ORAL | 0 refills | Status: AC | PRN

## 2023-01-05 MED ORDER — PREDNISONE 5 MG PO TABS
ORAL_TABLET | ORAL | 0 refills | Status: AC
Start: 2023-01-05 — End: 2023-03-02

## 2023-01-05 NOTE — Progress Notes (Signed)
I agree with the assessment and plan as outlined by Ms. Collier. 

## 2023-01-05 NOTE — Progress Notes (Signed)
01/05/2023 Monica Green [id number] 1985-06-27  Referring provider: Verlon Au, MD Primary GI doctor: Dr. Leonides Schanz  ASSESSMENT AND PLAN:   Ulcerative Colitis Severe, persistent symptoms including frequent diarrhea, rectal pain, and weight loss. Recent CT showed extensive colonic inflammation. No current treatment. Negative GI stool and C. difficile 10/2 CRP 1.7, did not get sed rate, pending fecal calprotectin -Start Prednisone for acute flare, taper sent in -Order labs for infection screening (TB, Hepatitis) and Vitamin D level. -Consider advanced therapies (Humira, Remicade, Loretha Brasil, [place]) pending lab results and insurance preference. -Provide patient with educational resources (IBDandme.org) to aid in decision making, at this point may consider Remicade patient would prefer infusion versus a needle at home.  She has not planning on having any further pregnancies -Schedule colonoscopy with Dr. Leonides Schanz. Last flex sig was 2016 We have discussed the risks of bleeding, infection, perforation, medication reactions, and remote risk of death associated with colonoscopy. All questions were answered and the patient acknowledges these risk and wishes to proceed.   Iron Deficiency Anemia 10/2 iron 12, saturations 3 Patient recently seen by Hematology and pending iron infusion. -Continue with planned iron infusion.  Vitamin B12 Deficiency Patient currently taking oral B complex vitamin. -Continue oral B complex vitamin.  Postpartum C-section wound Recently closed after prolonged healing period and multiple courses of antibiotics. No current signs of infection. -Monitor wound healing.  General Health Maintenance -Ensure up-to-date on all vaccinations. -Encourage consistent use of multivitamin.      Patient Care Team: Verlon Au, MD as PCP - General (Family Medicine) Hands, Jenel Lucks, NP as PCP - OBGYN (Obstetrics and Gynecology)  HISTORY OF  PRESENT ILLNESS: 37 y.o. female presents for evaluation of ulcerative pan colitis. Last seen in the office on 07/21/2021 by Dr. Leonides Schanz.   IBD history  Diagnosed 2016 after flex sig. Lialda 4800 mg daily Prednisone as needed for flares last time 07/21/2021 that related to episode of gastroenteritis  Last Flex sig 03/23/15: , patient was not on medication at that time 1. Severe Pancolitis. One G.I. pathogen panel is negative for enteric pathogens and will be repeated. The most likely etiology is Crohn's or ulcerative colitis. Path: Colon, biopsy, random - CHRONIC MILDLY ACTIVE COLITIS. - SEE MICROSCOPIC DESCRIPTION Microscopic Comment The lamina propria is markedly expanded by increased inflammatory infiltrate with focal reactive lymphoid aggregates. There is moderate to focally marked crypt distortion and there is focal ulceration. The findings are consistent with chronic minimally to mildly active inflammatory bowel disease. No granulomas or dysplasia is identified.  Last small bowel imaging: 08/27/2022 CT abdomen pelvis with contrast for history of intra-abdominal abscess with C-section 2 weeks prior Low transverse incision with scattered internal ear loculations no discrete fluid collection, expect appearance of C-section wound of lower anterior uterine segment.  To diffuse inflammatory wall thickening involving the entirety of the colon to the rectum with featureless appearance and fibrofatty mural stratification appearance very similar to prior examination highly suggestive of ulcerative colitis with acute inflammation.  Trace bilateral pleural effusions  Extraintestinal manifestations: The patient has not had any extraintestinal symptoms Surgical history: no surgery.  Other significant medical history: Anemia  Current History Discussed the use of AI scribe software for clinical note transcription with the patient, who gave verbal consent to proceed.  She had twins in May, premature,  and she has 38 year old. Her twin daughter passed at 4 months a month ago.   The patient, with a history of ulcerative colitis,  presents with constant diarrhea, rectal pain, and weight loss. The diarrhea is described as occurring every time she eats or drinks, and is often accompanied by blood and mucus. The patient reports waking up at night due to the need to use the bathroom. She denies any incontinence, attributing this to staying close to the bathroom. The patient also reports rectal pain, particularly during bowel movements. She has lost approximately 10 pounds from her usual weight. The patient denies any fevers, chills, nausea, vomiting, shortness of breath, chest pain, rashes, or joint pain. The patient has a recent history of a C-section, after which she was on antibiotics for a while. She is not currently on any medication for ulcerative colitis.      Recent labs: 01/03/2023 CRP 1.7  02/17/2020 SED RATE 25 Fecal cal pending 01/03/2023 WBC 5.3 HGB 8.8 MCV 70.8 Platelets 628 01/03/2023 Iron 12 Ferritin 3  B12 339 AST 8 ALT 6 Alkphos 49 TBili 0.3  TB GOLD 02/18/2021 NEGATIVE or TB skin if indeterminate.  HepBsAG 01/21/2021 NON-REACTIVE  TPMT Activity: 17  IBD Health Care Maintenance: Annual Flu Vaccine -  Pneumococcal Vaccine if receiving immunosuppression: -   TB testing if on anti-TNF, yearly -  Vitamin D screening -  COVID vaccine  Shingrix   She  reports that she has never smoked. She has never used smokeless tobacco. She reports that she does not currently use alcohol. She reports that she does not use drugs.  RELEVANT LABS AND IMAGING:  Results   LABS C diff: Negative (01/03/2023)  RADIOLOGY CT scan: Discrete fluid collection, no abscess or active infection, entire colon inflamed (08/2022)      CBC    Component Value Date/Time   WBC 5.3 01/03/2023 1118   WBC 7.0 08/31/2022 0628   RBC 4.45 01/03/2023 1118   HGB 8.8 (L) 01/03/2023 1118   HCT 31.5 (L) 01/03/2023  1118   PLT 628 (H) 01/03/2023 1118   MCV 70.8 (L) 01/03/2023 1118   MCH 19.8 (L) 01/03/2023 1118   MCHC 27.9 (L) 01/03/2023 1118   RDW 16.7 (H) 01/03/2023 1118   LYMPHSABS 1.7 01/03/2023 1118   MONOABS 1.0 01/03/2023 1118   EOSABS 1.0 (H) 01/03/2023 1118   BASOSABS 0.1 01/03/2023 1118   Recent Labs    07/24/22 1209 08/07/22 2222 08/08/22 0518 08/10/22 0512 08/10/22 1048 08/10/22 1913 08/11/22 0558 08/27/22 1241 08/31/22 0628 01/03/23 1118  HGB 9.7* 10.4* 9.7* 9.7* 9.3* 8.9* 8.3* 7.5* 8.0* 8.8*    CMP     Component Value Date/Time   NA 138 01/03/2023 1118   K 3.6 01/03/2023 1118   CL 105 01/03/2023 1118   CO2 28 01/03/2023 1118   GLUCOSE 87 01/03/2023 1118   BUN 9 01/03/2023 1118   CREATININE 0.69 01/03/2023 1118   CREATININE 0.43 (L) 04/07/2015 1538   CALCIUM 9.2 01/03/2023 1118   PROT 8.0 01/03/2023 1118   ALBUMIN 3.6 01/03/2023 1118   AST 8 (L) 01/03/2023 1118   ALT 6 01/03/2023 1118   ALKPHOS 49 01/03/2023 1118   BILITOT 0.3 01/03/2023 1118   GFRNONAA >60 01/03/2023 1118   GFRAA >60 03/27/2015 0557      Latest Ref Rng & Units 01/03/2023   11:18 AM 08/31/2022    6:28 AM 08/29/2022    7:57 AM  Hepatic Function  Total Protein 6.5 - 8.1 g/dL 8.0  6.4  6.8   Albumin 3.5 - 5.0 g/dL 3.6  1.9  2.1   AST 15 - 41 U/L  8  8  6    ALT 0 - 44 U/L 6  10  9    Alk Phosphatase 38 - 126 U/L 49  58  63   Total Bilirubin 0.3 - 1.2 mg/dL 0.3  <1.9  0.4       Current Medications:   Current Outpatient Medications (Endocrine & Metabolic):    predniSONE (DELTASONE) 5 MG tablet, Take 8 tablets (40 mg total) by mouth daily with breakfast for 7 days, THEN 7 tablets (35 mg total) daily with breakfast for 7 days, THEN 6 tablets (30 mg total) daily with breakfast for 7 days, THEN 5 tablets (25 mg total) daily with breakfast for 7 days, THEN 4 tablets (20 mg total) daily with breakfast for 7 days, THEN 3 tablets (15 mg total) daily with breakfast for 7 days, THEN 2 tablets (10 mg  total) daily with breakfast for 7 days, THEN 1 tablet (5 mg total) daily with breakfast for 7 days.    Current Outpatient Medications (Analgesics):    acetaminophen (TYLENOL) 500 MG tablet, Take 2 tablets (1,000 mg total) by mouth every 6 (six) hours for 4 days then as needed  Current Outpatient Medications (Hematological):    ferrous sulfate 325 (65 FE) MG tablet, Take 1 tablet (325 mg total) by mouth daily with breakfast.  Current Outpatient Medications (Other):    dicyclomine (BENTYL) 20 MG tablet, Take 1 tablet (20 mg total) by mouth 3 (three) times daily as needed for spasms.   Na Sulfate-K Sulfate-Mg Sulf 17.5-3.13-1.6 GM/177ML SOLN, Use as directed; may use generic; goodrx card if insurance will not cover generic   Prenatal Vit-Fe Fumarate-FA (PRENATAL MULTIVITAMIN) TABS tablet, Take 1 tablet by mouth daily at 12 noon. (Patient taking differently: Take 1 tablet by mouth daily.)  Medical History:  Past Medical History:  Diagnosis Date   Anemia    Colitis    Eclampsia 12/04/2020   IDA (iron deficiency anemia)    Normal postpartum course 11/26/2020   Pre-eclampsia    Pre-eclampsia in third trimester 11/23/2020   SVD (8/24) 11/24/2020   Term newborn delivered vaginally, current hospitalization 11/24/2020   Thrombocytosis 03/03/2020   Ulcerative colitis (HCC)    Allergies: No Known Allergies   Surgical History:  She  has a past surgical history that includes Flexible sigmoidoscopy (N/A, 03/23/2015); Cesarean section multi-gestational (N/A, 08/10/2022); Cesarean section; and Irrigation and debridement hematoma (N/A, 08/28/2022). Family History:  Her family history includes Asthma in her brother and sister; Cancer in her father; Colon cancer in her father; Hypertension in her father and paternal grandfather.  REVIEW OF SYSTEMS  : All other systems reviewed and negative except where noted in the History of Present Illness.  PHYSICAL EXAM: BP 120/68   Pulse 63   Ht 5\' 4"  (1.626  m)   Wt 131 lb 3.2 oz (59.5 kg)   SpO2 98%   BMI 22.52 kg/m  General Appearance: Well nourished, in no apparent distress. Head:   Normocephalic and atraumatic. Eyes:  sclerae anicteric,conjunctive pink  Respiratory: Respiratory effort normal, BS equal bilaterally without rales, rhonchi, wheezing. Cardio: RRR with no MRGs. Peripheral pulses intact.  Abdomen: Soft,  Flat ,active bowel sounds. mild tenderness in the lower abdomen. Without guarding and Without rebound. No masses. Rectal: declines Musculoskeletal: Full ROM, Normal gait. Without edema. Skin:  Dry and intact without significant lesions or rashes Neuro: Alert and  oriented x4;  No focal deficits. Psych:  Cooperative. Normal mood and affect.    Doree Albee,  PA-C 11:35 AM

## 2023-01-05 NOTE — Patient Instructions (Addendum)
Your provider has requested that you go to the basement level for lab work before leaving today. Press "B" on the elevator. The lab is located at the first door on the left as you exit the elevator.   At this visit we discussed starting a biologic therapy to help control your inflammatory bowel disease. Please visit these websites below for more information: [email or web address]   After discussing with your physician we have talked about starting you on remicaide. Your insurance may play a role into this but we do get prior authorizations and have a team that checks on that.  Biologics are medications that can heal intestinal tract, lower the chances of hospitalization and surgery.   More than half the patients have improvement in symptoms after starting these medications.  At this visit we discussed Biologics and the different range of the side effects which include:   -Can cause fatigue, reactions at injection site, scaly skin rashes including psoriasis.  -Specifically with anti-TNF Biologics there is a slightly increased risk of lymphoma or a form of blood cancer.    -Biologics also can affect the immune system in rare cases cause serious side effects like infections. Will get labs to evaluate for hidden infection like tuberculosis and hepatitis B which can lay dormant.  -Average risk of serious infection in IBD patients who are not on biologic therapy since 26 out of 1000 and will average risk of serious infection IBD patients on Biologics is 35 out of thousand.  Simulates important to understand having active inflammatory bowel disease puts her at greater risk for infection whether on a biologic or not.  -We also encourage all of her patients with inflammatory bowel disease whether on a biologic or not to stay up-to-date on her immunizations due to this reason. Typically we suggest getting the shingles vaccine  or Shingrix, as well as yearly influenza, COVID, pneumococcal vaccines.  - We also suggest yearly skin and eye exams.   You have been scheduled for a colonoscopy. Please follow written instructions given to you at your visit today.   Please pick up your prep supplies at the pharmacy within the next 1-3 days.  If you use inhalers (even only as needed), please bring them with you on the day of your procedure.  DO NOT TAKE 7 DAYS PRIOR TO TEST- Trulicity (dulaglutide) Ozempic, Wegovy (semaglutide) Mounjaro (tirzepatide) Bydureon Bcise (exanatide extended release)  DO NOT TAKE 1 DAY PRIOR TO YOUR TEST Rybelsus (semaglutide) Adlyxin (lixisenatide) Victoza (liraglutide) Byetta (exanatide) ___________________________________________________________________________  We have sent the following medications to your pharmacy for you to pick up at your convenience:  _______________________________________________________  If your blood pressure at your visit was 140/90 or greater, please contact your primary care physician to follow up on this.  _______________________________________________________  If you are age 9 or older, your body mass index should be between 23-30. Your Body mass index is 22.52 kg/m. If this is out of the aforementioned range listed, please consider follow up with your Primary Care Provider.  If you are age 73 or younger, your body mass index should be between 19-25. Your Body mass index is 22.52 kg/m. If this is out of the aformentioned range listed, please consider follow up with your Primary Care Provider.   ________________________________________________________  The [facility] providers would like to encourage you to use [facility] to communicate with providers for non-urgent requests or questions.  Due to long hold times on the telephone, sending your provider a message by [facility]  may be a faster and more efficient way to get a response.  Please allow 48  business hours for a response.  Please remember that this is for non-urgent requests.  _______________________________________________________  Due to recent changes in healthcare laws, you may see the results of your imaging and laboratory studies on MyChart before your provider has had a chance to review them.  We understand that in some cases there may be results that are confusing or concerning to you. Not all laboratory results come back in the same time frame and the provider may be waiting for multiple results in order to interpret others.  Please give [place] 48 hours in order for your provider to thoroughly review all the results before contacting the office for clarification of your results.   Thank you for entrusting me with your care and choosing [facility].  Quentin Mulling PA-C

## 2023-01-06 LAB — CALPROTECTIN, FECAL: Calprotectin, Fecal: 4480 ug/g — ABNORMAL HIGH (ref 0–120)

## 2023-01-08 NOTE — Progress Notes (Signed)
Hi Beth, please let the patient know that her fecal calprotectin was very elevated, suggesting that she has inflammation in her colon likely due to uncontrolled ulcerative colitis. Her infectious stool tests were negative. Hopefully with the steroid therapy she will see improvement in her symptoms, but she will need to be started on long term therapy such as a biologic. Could you check to see if she has a preference for any advanced biologic therapy after doing some more reading? If so, then we can initiate a prior authorization. If not and she wants more guidance, I can talk to her more about that. I am leaning towards either Remicade or Entyvio for her since Marchelle Folks told me that she prefers an infusion medication.

## 2023-01-10 ENCOUNTER — Encounter: Payer: Self-pay | Admitting: Hematology

## 2023-01-10 NOTE — Addendum Note (Signed)
Addended by: Wyvonnia Lora on: 01/10/2023 03:53 PM   Modules accepted: Orders

## 2023-01-11 ENCOUNTER — Telehealth: Payer: Self-pay | Admitting: Pharmacy Technician

## 2023-01-11 NOTE — Telephone Encounter (Signed)
Auth Submission: NO AUTH NEEDED Site of care: Site of care: [facility] Payer: [employer] medicaid Medication & CPT/J Code(s) submitted: Monoferric (Ferrci derisomaltose) [phone] Route of submission (phone, fax, portal):  Phone # Fax # Auth type: Buy/Bill PB Units/visits requested: x1 Reference number:  Approval from: 01/11/23 to 04/03/23

## 2023-01-18 ENCOUNTER — Ambulatory Visit: Payer: Medicaid Other

## 2023-01-18 VITALS — BP 98/67 | HR 86 | Temp 98.2°F | Resp 16 | Ht 64.0 in | Wt 133.4 lb

## 2023-01-18 DIAGNOSIS — D509 Iron deficiency anemia, unspecified: Secondary | ICD-10-CM

## 2023-01-18 DIAGNOSIS — D508 Other iron deficiency anemias: Secondary | ICD-10-CM

## 2023-01-18 MED ORDER — ACETAMINOPHEN 325 MG PO TABS
650.0000 mg | ORAL_TABLET | Freq: Once | ORAL | Status: AC
  Administered 2023-01-18: 650 mg via ORAL
  Filled 2023-01-18: qty 2

## 2023-01-18 MED ORDER — SODIUM CHLORIDE 0.9 % IV SOLN
1000.0000 mg | Freq: Once | INTRAVENOUS | Status: AC
  Administered 2023-01-18: 1000 mg via INTRAVENOUS
  Filled 2023-01-18: qty 10

## 2023-01-18 MED ORDER — LORATADINE 10 MG PO TABS
10.0000 mg | ORAL_TABLET | Freq: Once | ORAL | Status: AC
  Administered 2023-01-18: 10 mg via ORAL
  Filled 2023-01-18: qty 1

## 2023-01-18 NOTE — Patient Instructions (Signed)

## 2023-01-18 NOTE — Progress Notes (Signed)
Diagnosis: Acute Anemia  Provider:  Chilton Greathouse MD  Procedure: IV Infusion  IV Type: Peripheral, IV Location: L Hand  Feraheme (Ferumoxytol), Dose: 510 mg  Infusion Start Time: 1529  Infusion Stop Time: 1549  Post Infusion IV Care: Observation period completed and Peripheral IV Discontinued  Discharge: Condition: Good, Destination: Home . AVS Provided  Performed by:  Nat Math, RN

## 2023-02-14 ENCOUNTER — Encounter: Payer: Medicaid Other | Admitting: Internal Medicine

## 2023-03-08 ENCOUNTER — Other Ambulatory Visit: Payer: Self-pay | Admitting: Physician Assistant

## 2023-03-08 DIAGNOSIS — K5100 Ulcerative (chronic) pancolitis without complications: Secondary | ICD-10-CM

## 2023-04-09 ENCOUNTER — Ambulatory Visit: Payer: Medicaid Other | Admitting: Internal Medicine

## 2023-04-11 ENCOUNTER — Inpatient Hospital Stay: Payer: Medicaid Other | Attending: Hematology

## 2023-04-11 ENCOUNTER — Inpatient Hospital Stay: Payer: Medicaid Other | Admitting: Hematology

## 2023-04-23 ENCOUNTER — Telehealth: Payer: Self-pay

## 2023-04-23 NOTE — Telephone Encounter (Signed)
ENCOUNTER OPENED IN ERROR

## 2023-04-25 ENCOUNTER — Telehealth: Payer: Self-pay | Admitting: Internal Medicine

## 2023-04-25 ENCOUNTER — Encounter: Payer: Medicaid Other | Admitting: Internal Medicine

## 2023-04-25 NOTE — Telephone Encounter (Signed)
Good Morning Dr. Leonides Schanz,  We called this patient at 9:50 am today and left a message stating if they were running late or needed to reschedule to please call us.  I will NO SHOW this Patient   medicaid

## 2023-12-20 ENCOUNTER — Telehealth: Payer: Self-pay | Admitting: Physician Assistant

## 2023-12-20 DIAGNOSIS — K5100 Ulcerative (chronic) pancolitis without complications: Secondary | ICD-10-CM

## 2023-12-20 DIAGNOSIS — D508 Other iron deficiency anemias: Secondary | ICD-10-CM

## 2023-12-20 DIAGNOSIS — R197 Diarrhea, unspecified: Secondary | ICD-10-CM

## 2023-12-20 NOTE — Telephone Encounter (Signed)
 [employer] kit placed at 2nd floor front desk

## 2023-12-20 NOTE — Telephone Encounter (Signed)
 Inbound call from patient stating she feels like she's having a flare up due to having nonstop diarrhea since Sunday 12/16/23. Patient states she can not wait to be seen until October would also like to know if she can have a virtual visit since its hard for her to come into the office due to having a baby and working.  Requesting a call back  Please advise  Thank you

## 2023-12-20 NOTE — Telephone Encounter (Signed)
 Called and spoke with patient regarding Amanda's recommendations. Patient reports having at least 6 episodes of diarrhea a day. I advised patient that she will need to submit stool test and have labs drawn prior to her appointment. Patient knows to pick up Diatherix stool kit from 2nd floor. Patient has been advised that no appt is needed for lab work, provided patient with lab location and hours. Patient will be scheduled for MyChart video visit on Thursday, 12/27/23 at 1:30 pm with Alan. Patient verbalized understanding and had no concerns at the end of the call.  Lab orders in epic.   Di[employer] orders in epic. Nyla, RN will place Diathe[employer] kit at 2nd floor front desk.

## 2023-12-21 NOTE — Telephone Encounter (Signed)
 Appt has been released. MyChart video visit scheduled for 12/28/23 at 1:30 pm with Alan Coombs, PA.

## 2023-12-28 ENCOUNTER — Telehealth (INDEPENDENT_AMBULATORY_CARE_PROVIDER_SITE_OTHER): Payer: Self-pay | Admitting: Physician Assistant

## 2023-12-28 ENCOUNTER — Telehealth: Payer: Self-pay | Admitting: Physician Assistant

## 2023-12-28 DIAGNOSIS — K5100 Ulcerative (chronic) pancolitis without complications: Secondary | ICD-10-CM

## 2023-12-28 DIAGNOSIS — D508 Other iron deficiency anemias: Secondary | ICD-10-CM

## 2023-12-28 NOTE — Telephone Encounter (Signed)
 Attempted phone call 110 for upcoming 130 appointment.   Had to leave a message Had to leave a message again 120 for upcoming 130 appointment  Need to get updated patient information. Patient needs to come in and get lab work that was discussed with Cristino Single RN on 9/18.

## 2023-12-28 NOTE — Progress Notes (Signed)
 Patient was not able to be reached after multiple attempts.  Please update contact information.

## 2023-12-28 NOTE — Telephone Encounter (Signed)
 Still unable to reach patient at 150. Officially marked as a no show.  Please update information if she calls back and reschedule.

## 2024-03-12 ENCOUNTER — Emergency Department (HOSPITAL_BASED_OUTPATIENT_CLINIC_OR_DEPARTMENT_OTHER)
Admission: EM | Admit: 2024-03-12 | Discharge: 2024-03-12 | Discharge status: Against Medical Advice | Attending: Emergency Medicine | Admitting: Emergency Medicine

## 2024-03-12 ENCOUNTER — Other Ambulatory Visit: Payer: Self-pay

## 2024-03-12 ENCOUNTER — Encounter (HOSPITAL_BASED_OUTPATIENT_CLINIC_OR_DEPARTMENT_OTHER): Payer: Self-pay | Admitting: Emergency Medicine

## 2024-03-12 DIAGNOSIS — R197 Diarrhea, unspecified: Secondary | ICD-10-CM | POA: Diagnosis present

## 2024-03-12 DIAGNOSIS — Z5321 Procedure and treatment not carried out due to patient leaving prior to being seen by health care provider: Secondary | ICD-10-CM | POA: Diagnosis not present

## 2024-03-12 LAB — URINALYSIS, ROUTINE W REFLEX MICROSCOPIC
Bilirubin Urine: NEGATIVE
Glucose, UA: NEGATIVE mg/dL
Ketones, ur: 15 mg/dL — AB
Nitrite: NEGATIVE
Protein, ur: 30 mg/dL — AB
Specific Gravity, Urine: 1.024 (ref 1.005–1.030)
pH: 5.5 (ref 5.0–8.0)

## 2024-03-12 LAB — CBG MONITORING, ED: Glucose-Capillary: 86 mg/dL (ref 70–99)

## 2024-03-12 LAB — PREGNANCY, URINE: Preg Test, Ur: NEGATIVE

## 2024-03-12 NOTE — ED Triage Notes (Signed)
 Pt via pov from home with emesis and diarrhea last night; states she feels a bit better today, but still feels weak. Diarrhea continues, no emesis today. Pt states she has ulcerative colitis. Pt a&o x4; nad noted.

## 2024-03-12 NOTE — ED Notes (Signed)
Unsuccessful IV attempt, LAC. Pt tolerated well. 

## 2024-03-13 ENCOUNTER — Encounter (HOSPITAL_BASED_OUTPATIENT_CLINIC_OR_DEPARTMENT_OTHER): Payer: Self-pay | Admitting: Emergency Medicine

## 2024-03-13 ENCOUNTER — Other Ambulatory Visit: Payer: Self-pay

## 2024-03-13 ENCOUNTER — Emergency Department (HOSPITAL_BASED_OUTPATIENT_CLINIC_OR_DEPARTMENT_OTHER)

## 2024-03-13 ENCOUNTER — Emergency Department (HOSPITAL_BASED_OUTPATIENT_CLINIC_OR_DEPARTMENT_OTHER)
Admission: EM | Admit: 2024-03-13 | Discharge: 2024-03-13 | Disposition: A | Discharge status: Home / Self Care | Attending: Emergency Medicine | Admitting: Emergency Medicine

## 2024-03-13 DIAGNOSIS — K529 Noninfective gastroenteritis and colitis, unspecified: Secondary | ICD-10-CM | POA: Insufficient documentation

## 2024-03-13 DIAGNOSIS — E876 Hypokalemia: Secondary | ICD-10-CM | POA: Insufficient documentation

## 2024-03-13 DIAGNOSIS — D649 Anemia, unspecified: Secondary | ICD-10-CM | POA: Diagnosis not present

## 2024-03-13 LAB — CBC WITH DIFFERENTIAL/PLATELET
Abs Immature Granulocytes: 0.01 K/uL (ref 0.00–0.07)
Basophils Absolute: 0.1 K/uL (ref 0.0–0.1)
Basophils Relative: 1 %
Eosinophils Absolute: 0.8 K/uL — ABNORMAL HIGH (ref 0.0–0.5)
Eosinophils Relative: 13 %
HCT: 27 % — ABNORMAL LOW (ref 36.0–46.0)
Hemoglobin: 7 g/dL — ABNORMAL LOW (ref 12.0–15.0)
Immature Granulocytes: 0 %
Lymphocytes Relative: 17 %
Lymphs Abs: 1.1 K/uL (ref 0.7–4.0)
MCH: 16.3 pg — ABNORMAL LOW (ref 26.0–34.0)
MCHC: 25.9 g/dL — ABNORMAL LOW (ref 30.0–36.0)
MCV: 62.8 fL — ABNORMAL LOW (ref 80.0–100.0)
Monocytes Absolute: 1 K/uL (ref 0.1–1.0)
Monocytes Relative: 15 %
Neutro Abs: 3.6 K/uL (ref 1.7–7.7)
Neutrophils Relative %: 54 %
Platelets: 602 K/uL — ABNORMAL HIGH (ref 150–400)
RBC: 4.3 MIL/uL (ref 3.87–5.11)
RDW: 18.4 % — ABNORMAL HIGH (ref 11.5–15.5)
WBC: 6.6 K/uL (ref 4.0–10.5)
nRBC: 0 % (ref 0.0–0.2)

## 2024-03-13 LAB — COMPREHENSIVE METABOLIC PANEL WITH GFR
ALT: 12 U/L (ref 0–44)
AST: 19 U/L (ref 15–41)
Albumin: 3.8 g/dL (ref 3.5–5.0)
Alkaline Phosphatase: 65 U/L (ref 38–126)
Anion gap: 12 (ref 5–15)
BUN: 11 mg/dL (ref 6–20)
CO2: 26 mmol/L (ref 22–32)
Calcium: 9.3 mg/dL (ref 8.9–10.3)
Chloride: 100 mmol/L (ref 98–111)
Creatinine, Ser: 0.69 mg/dL (ref 0.44–1.00)
GFR, Estimated: >60 mL/min (ref >60)
Glucose, Bld: 85 mg/dL (ref 70–99)
Potassium: 3.3 mmol/L — ABNORMAL LOW (ref 3.5–5.1)
Sodium: 138 mmol/L (ref 135–145)
Total Bilirubin: 0.6 mg/dL (ref 0.0–1.2)
Total Protein: 8.3 g/dL — ABNORMAL HIGH (ref 6.5–8.1)

## 2024-03-13 LAB — LIPASE, BLOOD: Lipase: 13 U/L (ref 11–51)

## 2024-03-13 MED ORDER — IOHEXOL 300 MG/ML  SOLN
100.0000 mL | Freq: Once | INTRAMUSCULAR | Status: AC | PRN
  Administered 2024-03-13: 80 mL via INTRAVENOUS

## 2024-03-13 MED ORDER — LACTATED RINGERS IV BOLUS
1000.0000 mL | Freq: Once | INTRAVENOUS | Status: AC
  Administered 2024-03-13: 1000 mL via INTRAVENOUS

## 2024-03-13 MED ORDER — POTASSIUM CHLORIDE CRYS ER 20 MEQ PO TBCR
40.0000 meq | EXTENDED_RELEASE_TABLET | Freq: Once | ORAL | Status: AC
  Administered 2024-03-13: 40 meq via ORAL
  Filled 2024-03-13: qty 2

## 2024-03-13 NOTE — ED Notes (Signed)
 Patient called x 2, patient in restroom

## 2024-03-13 NOTE — Discharge Instructions (Signed)
 You were seen in the emergency department today for concerns of diarrhea.  Your labs and imaging were thankfully reassuring without any concerning findings for severe dehydration.  CT scan did not show any complications of your ulcerative colitis.  I would recommend following up closely with your GI provider.  Continue hydrating and following dietary changes over the next 4 days until your diarrhea and nausea and vomiting is fully resolved.  For any concerns of worsening symptoms, return to the emergency department.

## 2024-03-13 NOTE — ED Notes (Signed)
 Triage delay, pt in triage

## 2024-03-13 NOTE — ED Triage Notes (Signed)
 Pt c/ diarrhea starting 2 nights pta. Emesis improved. Pt eloped yesterday

## 2024-03-13 NOTE — ED Provider Notes (Signed)
 [facility] [place] [facility] EMERGENCY DEPARTMENT AT [facility] Provider Note   CSN: [id number] Arrival date & time: 03/13/24  1009     Patient presents with: Diarrhea   Monica Green is a 38 y.o. female.  Patient with past history significant for ulcerative colitis, iron  deficiency anemia, thrombocytosis presents to ED with concerns of diarrhea./Due to having diarrhea for the last 2 days and had 1 episode of vomiting.  Was initially triaged yesterday in the emergency department but left before being seen.  She reports that at she has had ongoing issues with her diarrhea and was initially concern for possible ulcerative colitis flareup.  She took a dose of prednisone  at home prior to arriving in she is unsure this has been helpful.  No reported fever, chills, or bodyaches.   Diarrhea      Prior to Admission medications  Medication Sig Start Date End Date Taking? Authorizing Provider  acetaminophen  (TYLENOL ) 500 MG tablet Take 2 tablets (1,000 mg total) by mouth every 6 (six) hours for 4 days then as needed 08/31/22   Ogunbekun, Eboni D, DO  dicyclomine  (BENTYL ) 20 MG tablet Take 1 tablet (20 mg total) by mouth 3 (three) times daily as needed for spasms. 01/05/23   Craig Alan SAUNDERS, PA-C  ferrous sulfate  325 (65 FE) MG tablet Take 1 tablet (325 mg total) by mouth daily with breakfast. 08/31/22 08/31/23  Ogunbekun, Eboni D, DO  Na Sulfate-K Sulfate-Mg Sulf 17.5-3.13-1.6 GM/177ML SOLN Use as directed; may use generic; goodrx card if insurance will not cover generic 01/05/23   Craig Alan SAUNDERS, PA-C  Prenatal Vit-Fe Fumarate-FA (PRENATAL MULTIVITAMIN) TABS tablet Take 1 tablet by mouth daily at 12 noon. Patient taking differently: Take 1 tablet by mouth daily. 12/06/20   Pinn, Walda, MD    Allergies: Patient has no known allergies.    Review of Systems  Gastrointestinal:  Positive for diarrhea.  All other systems reviewed and are negative.   Updated Vital Signs BP 103/72 (BP Location:  Right Arm)   Pulse 96   Temp 98.4 F (36.9 C)   Resp 16   LMP 03/05/2024 (Approximate)   SpO2 100%   Physical Exam Vitals and nursing note reviewed.  Constitutional:      General: She is not in acute distress.    Appearance: She is well-developed.  HENT:     Head: Normocephalic and atraumatic.  Eyes:     Conjunctiva/sclera: Conjunctivae normal.  Cardiovascular:     Rate and Rhythm: Normal rate and regular rhythm.     Heart sounds: No murmur heard. Pulmonary:     Effort: Pulmonary effort is normal. No respiratory distress.     Breath sounds: Normal breath sounds.  Abdominal:     Palpations: Abdomen is soft.     Tenderness: There is abdominal tenderness. There is no guarding.     Comments: Mild tenderness to palpation in the lateral portions of the abdomen.  Musculoskeletal:        General: No swelling.     Cervical back: Neck supple.  Skin:    General: Skin is warm and dry.     Capillary Refill: Capillary refill takes less than 2 seconds.  Neurological:     Mental Status: She is alert.  Psychiatric:        Mood and Affect: Mood normal.     (all labs ordered are listed, but only abnormal results are displayed) Labs Reviewed  CBC WITH DIFFERENTIAL/PLATELET - Abnormal; Notable for the following components:  Result Value   Hemoglobin 7.0 (*)    HCT 27.0 (*)    MCV 62.8 (*)    MCH 16.3 (*)    MCHC 25.9 (*)    RDW 18.4 (*)    Platelets 602 (*)    Eosinophils Absolute 0.8 (*)    All other components within normal limits  COMPREHENSIVE METABOLIC PANEL WITH GFR - Abnormal; Notable for the following components:   Potassium 3.3 (*)    Total Protein 8.3 (*)    All other components within normal limits  LIPASE, BLOOD    EKG: None  Radiology: CT ABDOMEN PELVIS W CONTRAST Result Date: 03/13/2024 CLINICAL DATA:  Abdominal pain and diarrhea 2 days.  Emesis. EXAM: CT ABDOMEN AND PELVIS WITH CONTRAST TECHNIQUE: Multidetector CT imaging of the abdomen and pelvis was  performed using the standard protocol following bolus administration of intravenous contrast. RADIATION DOSE REDUCTION: This exam was performed according to the departmental dose-optimization program which includes automated exposure control, adjustment of the mA and/or kV according to patient size and/or use of iterative reconstruction technique. CONTRAST:  80mL OMNIPAQUE  IOHEXOL  300 MG/ML  SOLN COMPARISON:  08/27/2022 FINDINGS: Lower chest: Heart is normal size. Images over the lung bases demonstrate no acute findings. Hepatobiliary: Liver, gallbladder and biliary tree are normal. Pancreas: Normal. Spleen: Normal. Adrenals/Urinary Tract: Adrenal glands are normal. Kidneys are normal in size without hydronephrosis or nephrolithiasis. Ureters and bladder are normal. Stomach/Bowel: Stomach and small bowel are normal. Appendix is normal. Mild wall thickening throughout the colon with subtle induration of the pericolonic fat. These findings are similar to the prior exam and compatible with known ulcerative colitis. Vascular/Lymphatic: Abdominal aorta is normal in caliber. Remaining vascular structures are unremarkable. No adenopathy. Reproductive: Uterus and bilateral adnexa are unremarkable. Other: No free fluid or focal inflammatory change. Musculoskeletal: No focal abnormality. IMPRESSION: 1. No acute findings in the abdomen/pelvis. 2. Mild wall thickening throughout the colon with subtle stranding of the pericolonic fat. These findings are similar to the prior exam and compatible with known ulcerative colitis. Electronically Signed   By: Toribio Agreste M.D.   On: 03/13/2024 15:25     Procedures   Medications Ordered in the ED  lactated ringers  bolus 1,000 mL (0 mLs Intravenous Stopped 03/13/24 1326)  iohexol  (OMNIPAQUE ) 300 MG/ML solution 100 mL (80 mLs Intravenous Contrast Given 03/13/24 1424)  potassium chloride  SA (KLOR-CON  M) CR tablet 40 mEq (40 mEq Oral Given 03/13/24 1551)      Medical Decision  Making Amount and/or Complexity of Data Reviewed Labs: ordered. Radiology: ordered.  Risk Prescription drug management.   This patient presents to the ED for concern of diarrhea, this involves an extensive number of treatment options, and is a complaint that carries with it a high risk of complications and morbidity.  The differential diagnosis includes gastroenteritis, ulcerative colitis flareup, bowel obstruction, UTI, pyelonephritis, urolithiasis   Co morbidities that complicate the patient evaluation  Ulcerative colitis, iron  deficiency anemia, thrombocytosis   Additional history obtained:  Additional history obtained from chart review   Lab Tests:  I Ordered, and personally interpreted labs.  The pertinent results include: Urine pregnancy yesterday negative, UA yesterday concern for possible infection but currently asymptomatic but there is some blood seen, CBC hemoglobin down to 7.0 with patient having notable history of anemia, CMP hypokalemia with potassium of 3.3, lipase unremarkable at 13   Imaging Studies ordered:  I ordered imaging studies including CT abdomen pelvis I independently visualized and interpreted imaging which showed  No acute findings in the abdomen/pelvis. 2. Mild wall thickening throughout the colon with subtle stranding of the pericolonic fat. These findings are similar to the prior exam and compatible with known ulcerative colitis. I agree with the radiologist interpretation   Consultations Obtained:  I requested consultation with none,  and discussed lab and imaging findings as well as pertinent plan - they recommend: N/A   Problem List / ED Course / Critical interventions / Medication management  Patient past history significant for ulcerative colitis presents emergency department concerns of diarrhea.  Reports about 2 days of diarrhea with 1 episode of vomiting.  Denies any hematemesis, but does report some blood in her stool which is somewhat  typical for her with her ulcerative colitis when condition flares up.  She took a dose of prednisone  earlier without any significant changes in her symptoms. Physical exam reveals some slight tenderness in the abdomen towards the lateral portions with no obvious CVA tenderness.  Vitals show that she is tachycardic with borderline low and blood pressure.  Fluid resuscitation initiated.  Will proceed with lab workup and imaging given her concern for possible complications of ulcerative colitis.  Urine pregnancy from yesterday was negative.  UA from yesterday with possible concerns for infection but no obvious symptoms beyond some of the flank pain that she is reporting. Patient's workup shows notable anemia with hemoglobin at 7.0.  No reported GI source of bleeding.  Has a history of anemia with pregnancy in the 7s.  Denies significant NSAID use, aspirin use, or use of any anticoagulant. CT of the pelvis is negative for any acute findings.  There is some wall thickening throughout the colon with subtle stranding there pericolonic fat.  Consistent with prior known history of ulcerative colitis. Given reassuring presentation and uptrending symptoms with no vomiting or diarrhea seen here in the emergency department, patient is requesting be discharged home as she feels that she is likely improving and just wanted to make sure that she was not slightly dehydrated or having any acute complications from her ulcerative colitis.  Fluids were administered and patient reports improvement after 1 L fluid bolus.  She is otherwise stable for outpatient follow-up and is discharged home. I ordered medication including fluids, potassium chloride  for dehydration, hypokalemia Reevaluation of the patient after these medicines showed that the patient improved I have reviewed the patients home medicines and have made adjustments as needed   Social Determinants of Health:  None   Test / Admission - Considered:  Considered  but stable for outpatient follow-up.  Final diagnoses:  Gastroenteritis  Hypokalemia due to excessive gastrointestinal loss of potassium  Anemia, unspecified type    ED Discharge Orders     None          Cecily Legrand LABOR, PA-C 03/13/24 1606    Dean Clarity, MD 03/14/24 1257

## 2024-03-13 NOTE — ED Notes (Signed)
 DC paperwork given and verbally understood.
